# Patient Record
Sex: Female | Born: 1975
Health system: Southern US, Community
[De-identification: ages and names within clinical notes are randomized; demographics above are authoritative.]

## PROBLEM LIST (undated history)

## (undated) ENCOUNTER — Inpatient Hospital Stay (HOSPITAL_COMMUNITY): Payer: Self-pay

## (undated) DIAGNOSIS — I10 Essential (primary) hypertension: Secondary | ICD-10-CM

## (undated) DIAGNOSIS — Z9889 Other specified postprocedural states: Secondary | ICD-10-CM

## (undated) DIAGNOSIS — G43909 Migraine, unspecified, not intractable, without status migrainosus: Secondary | ICD-10-CM

## (undated) DIAGNOSIS — E119 Type 2 diabetes mellitus without complications: Secondary | ICD-10-CM

## (undated) DIAGNOSIS — A749 Chlamydial infection, unspecified: Secondary | ICD-10-CM

## (undated) DIAGNOSIS — A599 Trichomoniasis, unspecified: Secondary | ICD-10-CM

## (undated) DIAGNOSIS — G5601 Carpal tunnel syndrome, right upper limb: Secondary | ICD-10-CM

## (undated) DIAGNOSIS — O24419 Gestational diabetes mellitus in pregnancy, unspecified control: Secondary | ICD-10-CM

## (undated) DIAGNOSIS — M779 Enthesopathy, unspecified: Secondary | ICD-10-CM

## (undated) DIAGNOSIS — N83209 Unspecified ovarian cyst, unspecified side: Secondary | ICD-10-CM

## (undated) HISTORY — PX: BACK SURGERY: SHX140

## (undated) HISTORY — PX: WISDOM TOOTH EXTRACTION: SHX21

## (undated) HISTORY — PX: ABDOMINAL HYSTERECTOMY: SHX81

## (undated) HISTORY — DX: Other specified postprocedural states: Z98.890

---

## 1999-07-23 ENCOUNTER — Inpatient Hospital Stay (HOSPITAL_COMMUNITY): Admission: AD | Admit: 1999-07-23 | Discharge: 1999-07-23 | Payer: Self-pay | Admitting: Obstetrics

## 1999-07-24 ENCOUNTER — Encounter: Payer: Self-pay | Admitting: Obstetrics & Gynecology

## 1999-12-06 ENCOUNTER — Emergency Department (HOSPITAL_COMMUNITY): Admission: EM | Admit: 1999-12-06 | Discharge: 1999-12-06 | Payer: Self-pay | Admitting: Emergency Medicine

## 1999-12-27 ENCOUNTER — Emergency Department (HOSPITAL_COMMUNITY): Admission: EM | Admit: 1999-12-27 | Discharge: 1999-12-27 | Payer: Self-pay | Admitting: Unknown Physician Specialty

## 1999-12-27 ENCOUNTER — Encounter: Payer: Self-pay | Admitting: Emergency Medicine

## 2000-04-26 ENCOUNTER — Other Ambulatory Visit: Admission: RE | Admit: 2000-04-26 | Discharge: 2000-04-26 | Payer: Self-pay | Admitting: Obstetrics

## 2000-09-10 ENCOUNTER — Emergency Department (HOSPITAL_COMMUNITY): Admission: EM | Admit: 2000-09-10 | Discharge: 2000-09-10 | Payer: Self-pay | Admitting: Emergency Medicine

## 2001-01-23 ENCOUNTER — Emergency Department (HOSPITAL_COMMUNITY): Admission: EM | Admit: 2001-01-23 | Discharge: 2001-01-23 | Payer: Self-pay | Admitting: Internal Medicine

## 2001-03-29 ENCOUNTER — Other Ambulatory Visit: Admission: RE | Admit: 2001-03-29 | Discharge: 2001-03-29 | Payer: Self-pay | Admitting: Obstetrics

## 2001-12-12 ENCOUNTER — Emergency Department (HOSPITAL_COMMUNITY): Admission: EM | Admit: 2001-12-12 | Discharge: 2001-12-12 | Payer: Self-pay

## 2001-12-15 ENCOUNTER — Emergency Department (HOSPITAL_COMMUNITY): Admission: EM | Admit: 2001-12-15 | Discharge: 2001-12-15 | Payer: Self-pay | Admitting: Emergency Medicine

## 2001-12-16 ENCOUNTER — Emergency Department (HOSPITAL_COMMUNITY): Admission: EM | Admit: 2001-12-16 | Discharge: 2001-12-16 | Payer: Self-pay | Admitting: Emergency Medicine

## 2002-02-12 ENCOUNTER — Emergency Department (HOSPITAL_COMMUNITY): Admission: EM | Admit: 2002-02-12 | Discharge: 2002-02-12 | Payer: Self-pay | Admitting: Emergency Medicine

## 2002-05-07 ENCOUNTER — Emergency Department (HOSPITAL_COMMUNITY): Admission: EM | Admit: 2002-05-07 | Discharge: 2002-05-07 | Payer: Self-pay | Admitting: Emergency Medicine

## 2002-05-07 ENCOUNTER — Encounter: Payer: Self-pay | Admitting: Emergency Medicine

## 2002-08-07 ENCOUNTER — Emergency Department (HOSPITAL_COMMUNITY): Admission: EM | Admit: 2002-08-07 | Discharge: 2002-08-07 | Payer: Self-pay | Admitting: Emergency Medicine

## 2002-08-08 ENCOUNTER — Emergency Department (HOSPITAL_COMMUNITY): Admission: EM | Admit: 2002-08-08 | Discharge: 2002-08-08 | Payer: Self-pay | Admitting: Emergency Medicine

## 2002-12-26 ENCOUNTER — Emergency Department (HOSPITAL_COMMUNITY): Admission: EM | Admit: 2002-12-26 | Discharge: 2002-12-26 | Payer: Self-pay

## 2003-01-29 ENCOUNTER — Emergency Department (HOSPITAL_COMMUNITY): Admission: EM | Admit: 2003-01-29 | Discharge: 2003-01-29 | Payer: Self-pay | Admitting: Emergency Medicine

## 2003-10-04 ENCOUNTER — Emergency Department (HOSPITAL_COMMUNITY): Admission: EM | Admit: 2003-10-04 | Discharge: 2003-10-04 | Payer: Self-pay | Admitting: Internal Medicine

## 2003-10-04 ENCOUNTER — Encounter: Payer: Self-pay | Admitting: Emergency Medicine

## 2003-10-08 ENCOUNTER — Encounter: Payer: Self-pay | Admitting: Obstetrics and Gynecology

## 2003-10-08 ENCOUNTER — Inpatient Hospital Stay (HOSPITAL_COMMUNITY): Admission: AD | Admit: 2003-10-08 | Discharge: 2003-10-08 | Payer: Self-pay | Admitting: Obstetrics and Gynecology

## 2003-10-16 ENCOUNTER — Encounter: Admission: RE | Admit: 2003-10-16 | Discharge: 2003-10-16 | Payer: Self-pay | Admitting: Obstetrics and Gynecology

## 2003-10-25 ENCOUNTER — Ambulatory Visit (HOSPITAL_COMMUNITY): Admission: RE | Admit: 2003-10-25 | Discharge: 2003-10-25 | Payer: Self-pay | Admitting: Obstetrics and Gynecology

## 2004-07-16 ENCOUNTER — Inpatient Hospital Stay (HOSPITAL_COMMUNITY): Admission: AD | Admit: 2004-07-16 | Discharge: 2004-07-16 | Payer: Self-pay | Admitting: Obstetrics and Gynecology

## 2004-11-18 ENCOUNTER — Inpatient Hospital Stay (HOSPITAL_COMMUNITY): Admission: AD | Admit: 2004-11-18 | Discharge: 2004-11-18 | Payer: Self-pay | Admitting: *Deleted

## 2004-11-20 ENCOUNTER — Inpatient Hospital Stay (HOSPITAL_COMMUNITY): Admission: AD | Admit: 2004-11-20 | Discharge: 2004-11-20 | Payer: Self-pay | Admitting: Obstetrics & Gynecology

## 2004-12-09 ENCOUNTER — Ambulatory Visit: Payer: Self-pay | Admitting: Obstetrics & Gynecology

## 2005-01-22 ENCOUNTER — Emergency Department (HOSPITAL_COMMUNITY): Admission: EM | Admit: 2005-01-22 | Discharge: 2005-01-22 | Payer: Self-pay | Admitting: Emergency Medicine

## 2005-02-03 ENCOUNTER — Ambulatory Visit: Payer: Self-pay | Admitting: Obstetrics & Gynecology

## 2005-02-04 ENCOUNTER — Ambulatory Visit (HOSPITAL_COMMUNITY): Admission: RE | Admit: 2005-02-04 | Discharge: 2005-02-04 | Payer: Self-pay | Admitting: Obstetrics & Gynecology

## 2005-07-02 ENCOUNTER — Inpatient Hospital Stay (HOSPITAL_COMMUNITY): Admission: AD | Admit: 2005-07-02 | Discharge: 2005-07-02 | Payer: Self-pay | Admitting: Obstetrics & Gynecology

## 2005-07-20 ENCOUNTER — Inpatient Hospital Stay (HOSPITAL_COMMUNITY): Admission: AD | Admit: 2005-07-20 | Discharge: 2005-07-20 | Payer: Self-pay | Admitting: Obstetrics & Gynecology

## 2005-07-30 ENCOUNTER — Emergency Department (HOSPITAL_COMMUNITY): Admission: EM | Admit: 2005-07-30 | Discharge: 2005-07-30 | Payer: Self-pay | Admitting: Emergency Medicine

## 2005-11-08 ENCOUNTER — Inpatient Hospital Stay (HOSPITAL_COMMUNITY): Admission: AD | Admit: 2005-11-08 | Discharge: 2005-11-08 | Payer: Self-pay | Admitting: Obstetrics and Gynecology

## 2005-12-30 ENCOUNTER — Emergency Department (HOSPITAL_COMMUNITY): Admission: EM | Admit: 2005-12-30 | Discharge: 2005-12-30 | Payer: Self-pay | Admitting: Emergency Medicine

## 2006-04-06 ENCOUNTER — Inpatient Hospital Stay (HOSPITAL_COMMUNITY): Admission: AD | Admit: 2006-04-06 | Discharge: 2006-04-06 | Payer: Self-pay | Admitting: *Deleted

## 2006-08-17 ENCOUNTER — Inpatient Hospital Stay (HOSPITAL_COMMUNITY): Admission: AD | Admit: 2006-08-17 | Discharge: 2006-08-18 | Payer: Self-pay | Admitting: Obstetrics and Gynecology

## 2006-09-08 ENCOUNTER — Inpatient Hospital Stay (HOSPITAL_COMMUNITY): Admission: AD | Admit: 2006-09-08 | Discharge: 2006-09-08 | Payer: Self-pay | Admitting: Gynecology

## 2006-10-26 ENCOUNTER — Inpatient Hospital Stay (HOSPITAL_COMMUNITY): Admission: AD | Admit: 2006-10-26 | Discharge: 2006-10-26 | Payer: Self-pay | Admitting: Family Medicine

## 2006-12-01 ENCOUNTER — Ambulatory Visit: Payer: Self-pay | Admitting: *Deleted

## 2006-12-01 ENCOUNTER — Inpatient Hospital Stay (HOSPITAL_COMMUNITY): Admission: AD | Admit: 2006-12-01 | Discharge: 2006-12-01 | Payer: Self-pay | Admitting: Obstetrics & Gynecology

## 2007-01-18 ENCOUNTER — Inpatient Hospital Stay (HOSPITAL_COMMUNITY): Admission: AD | Admit: 2007-01-18 | Discharge: 2007-01-18 | Payer: Self-pay | Admitting: Obstetrics

## 2007-03-04 ENCOUNTER — Inpatient Hospital Stay (HOSPITAL_COMMUNITY): Admission: AD | Admit: 2007-03-04 | Discharge: 2007-03-04 | Payer: Self-pay | Admitting: Obstetrics

## 2007-04-03 ENCOUNTER — Inpatient Hospital Stay (HOSPITAL_COMMUNITY): Admission: AD | Admit: 2007-04-03 | Discharge: 2007-04-05 | Payer: Self-pay | Admitting: Obstetrics

## 2007-10-30 ENCOUNTER — Emergency Department (HOSPITAL_COMMUNITY): Admission: EM | Admit: 2007-10-30 | Discharge: 2007-10-30 | Payer: Self-pay | Admitting: Emergency Medicine

## 2008-04-03 ENCOUNTER — Emergency Department (HOSPITAL_COMMUNITY): Admission: EM | Admit: 2008-04-03 | Discharge: 2008-04-03 | Payer: Self-pay | Admitting: Emergency Medicine

## 2008-07-26 ENCOUNTER — Emergency Department (HOSPITAL_COMMUNITY): Admission: EM | Admit: 2008-07-26 | Discharge: 2008-07-26 | Payer: Self-pay | Admitting: Emergency Medicine

## 2009-02-18 HISTORY — PX: CHOLECYSTECTOMY: SHX55

## 2009-03-08 ENCOUNTER — Inpatient Hospital Stay (HOSPITAL_COMMUNITY): Admission: AD | Admit: 2009-03-08 | Discharge: 2009-03-08 | Payer: Self-pay | Admitting: Obstetrics & Gynecology

## 2009-03-31 ENCOUNTER — Emergency Department (HOSPITAL_COMMUNITY): Admission: EM | Admit: 2009-03-31 | Discharge: 2009-03-31 | Payer: Self-pay | Admitting: Emergency Medicine

## 2009-11-13 ENCOUNTER — Inpatient Hospital Stay (HOSPITAL_COMMUNITY): Admission: AD | Admit: 2009-11-13 | Discharge: 2009-11-13 | Payer: Self-pay | Admitting: Obstetrics and Gynecology

## 2009-11-21 ENCOUNTER — Emergency Department (HOSPITAL_COMMUNITY): Admission: EM | Admit: 2009-11-21 | Discharge: 2009-11-21 | Payer: Self-pay | Admitting: Emergency Medicine

## 2010-03-13 ENCOUNTER — Encounter (INDEPENDENT_AMBULATORY_CARE_PROVIDER_SITE_OTHER): Payer: Self-pay | Admitting: General Surgery

## 2010-03-13 ENCOUNTER — Ambulatory Visit (HOSPITAL_COMMUNITY): Admission: RE | Admit: 2010-03-13 | Discharge: 2010-03-14 | Payer: Self-pay | Admitting: General Surgery

## 2010-05-19 ENCOUNTER — Inpatient Hospital Stay (HOSPITAL_COMMUNITY): Admission: AD | Admit: 2010-05-19 | Discharge: 2010-05-19 | Payer: Self-pay | Admitting: Family Medicine

## 2010-06-19 ENCOUNTER — Emergency Department (HOSPITAL_COMMUNITY): Admission: EM | Admit: 2010-06-19 | Discharge: 2010-06-19 | Payer: Self-pay | Admitting: Emergency Medicine

## 2010-10-22 ENCOUNTER — Emergency Department (HOSPITAL_COMMUNITY)
Admission: EM | Admit: 2010-10-22 | Discharge: 2010-10-22 | Payer: Self-pay | Source: Home / Self Care | Admitting: Family Medicine

## 2010-12-15 ENCOUNTER — Inpatient Hospital Stay (HOSPITAL_COMMUNITY)
Admission: AD | Admit: 2010-12-15 | Discharge: 2010-12-15 | Payer: Self-pay | Source: Home / Self Care | Attending: Obstetrics & Gynecology | Admitting: Obstetrics & Gynecology

## 2011-01-11 ENCOUNTER — Encounter: Payer: Self-pay | Admitting: *Deleted

## 2011-03-02 LAB — WET PREP, GENITAL
Clue Cells Wet Prep HPF POC: NONE SEEN
Trich, Wet Prep: NONE SEEN
Yeast Wet Prep HPF POC: NONE SEEN

## 2011-03-02 LAB — GC/CHLAMYDIA PROBE AMP, GENITAL
Chlamydia, DNA Probe: NEGATIVE
GC Probe Amp, Genital: NEGATIVE

## 2011-03-09 LAB — URINALYSIS, ROUTINE W REFLEX MICROSCOPIC
Bilirubin Urine: NEGATIVE
Glucose, UA: NEGATIVE mg/dL
Ketones, ur: NEGATIVE mg/dL
Leukocytes, UA: NEGATIVE
Nitrite: NEGATIVE
Protein, ur: NEGATIVE mg/dL
Specific Gravity, Urine: >1.03 — ABNORMAL HIGH (ref 1.005–1.030)
Urobilinogen, UA: 0.2 mg/dL (ref 0.0–1.0)
pH: 6 (ref 5.0–8.0)

## 2011-03-09 LAB — CBC
HCT: 38.5 % (ref 36.0–46.0)
Hemoglobin: 13.1 g/dL (ref 12.0–15.0)
MCHC: 33.9 g/dL (ref 30.0–36.0)
MCV: 90.3 fL (ref 78.0–100.0)
Platelets: 290 10*3/uL (ref 150–400)
RBC: 4.27 MIL/uL (ref 3.87–5.11)
RDW: 14.7 % (ref 11.5–15.5)
WBC: 6.1 10*3/uL (ref 4.0–10.5)

## 2011-03-09 LAB — GC/CHLAMYDIA PROBE AMP, GENITAL
Chlamydia, DNA Probe: NEGATIVE
GC Probe Amp, Genital: NEGATIVE

## 2011-03-09 LAB — WET PREP, GENITAL
Trich, Wet Prep: NONE SEEN
Yeast Wet Prep HPF POC: NONE SEEN

## 2011-03-09 LAB — URINE MICROSCOPIC-ADD ON

## 2011-03-09 LAB — POCT PREGNANCY, URINE: Preg Test, Ur: NEGATIVE

## 2011-03-09 LAB — HCG, QUANTITATIVE, PREGNANCY: hCG, Beta Chain, Quant, S: <2 m[IU]/mL (ref <5)

## 2011-03-16 LAB — CBC
HCT: 40.4 % (ref 36.0–46.0)
Hemoglobin: 13.4 g/dL (ref 12.0–15.0)
MCHC: 33.2 g/dL (ref 30.0–36.0)
MCV: 90.3 fL (ref 78.0–100.0)
Platelets: 278 10*3/uL (ref 150–400)
RBC: 4.48 MIL/uL (ref 3.87–5.11)
RDW: 14.3 % (ref 11.5–15.5)
WBC: 6.1 10*3/uL (ref 4.0–10.5)

## 2011-03-16 LAB — DIFFERENTIAL
Basophils Absolute: 0 10*3/uL (ref 0.0–0.1)
Lymphocytes Relative: 40 % (ref 12–46)
Neutro Abs: 2.9 10*3/uL (ref 1.7–7.7)

## 2011-03-24 LAB — COMPREHENSIVE METABOLIC PANEL
Albumin: 3.4 g/dL — ABNORMAL LOW (ref 3.5–5.2)
Alkaline Phosphatase: 79 U/L (ref 39–117)
BUN: 8 mg/dL (ref 6–23)
CO2: 25 mEq/L (ref 19–32)
Chloride: 100 mEq/L (ref 96–112)
Glucose, Bld: 153 mg/dL — ABNORMAL HIGH (ref 70–99)
Potassium: 5.6 mEq/L — ABNORMAL HIGH (ref 3.5–5.1)
Total Bilirubin: 2 mg/dL — ABNORMAL HIGH (ref 0.3–1.2)

## 2011-03-24 LAB — URINE MICROSCOPIC-ADD ON

## 2011-03-24 LAB — URINALYSIS, ROUTINE W REFLEX MICROSCOPIC
Hgb urine dipstick: NEGATIVE
Protein, ur: NEGATIVE mg/dL
Urobilinogen, UA: 1 mg/dL (ref 0.0–1.0)

## 2011-03-24 LAB — DIFFERENTIAL
Basophils Absolute: 0.1 10*3/uL (ref 0.0–0.1)
Basophils Relative: 1 % (ref 0–1)
Monocytes Absolute: 0.6 10*3/uL (ref 0.1–1.0)
Neutro Abs: 7.8 10*3/uL — ABNORMAL HIGH (ref 1.7–7.7)
Neutrophils Relative %: 70 % (ref 43–77)

## 2011-03-24 LAB — CBC
HCT: 37.1 % (ref 36.0–46.0)
Hemoglobin: 12.6 g/dL (ref 12.0–15.0)
RBC: 4.07 MIL/uL (ref 3.87–5.11)
WBC: 11.1 10*3/uL — ABNORMAL HIGH (ref 4.0–10.5)

## 2011-03-24 LAB — POCT PREGNANCY, URINE: Preg Test, Ur: NEGATIVE

## 2011-03-24 LAB — POTASSIUM: Potassium: 4 mEq/L (ref 3.5–5.1)

## 2011-03-25 LAB — COMPREHENSIVE METABOLIC PANEL
ALT: 22 U/L (ref 0–35)
AST: 23 U/L (ref 0–37)
CO2: 28 mEq/L (ref 19–32)
Chloride: 100 mEq/L (ref 96–112)
GFR calc Af Amer: >60 mL/min (ref >60)
GFR calc non Af Amer: >60 mL/min (ref >60)
Potassium: 3.9 mEq/L (ref 3.5–5.1)
Sodium: 136 mEq/L (ref 135–145)
Total Bilirubin: 0.5 mg/dL (ref 0.3–1.2)

## 2011-03-25 LAB — URINE MICROSCOPIC-ADD ON

## 2011-03-25 LAB — POCT PREGNANCY, URINE: Preg Test, Ur: NEGATIVE

## 2011-03-25 LAB — URINALYSIS, ROUTINE W REFLEX MICROSCOPIC
Bilirubin Urine: NEGATIVE
Glucose, UA: NEGATIVE mg/dL
Protein, ur: 30 mg/dL — AB
Urobilinogen, UA: 0.2 mg/dL (ref 0.0–1.0)

## 2011-03-25 LAB — WET PREP, GENITAL
Trich, Wet Prep: NONE SEEN
Yeast Wet Prep HPF POC: NONE SEEN

## 2011-03-25 LAB — CBC
RBC: 4.62 MIL/uL (ref 3.87–5.11)
WBC: 10.6 10*3/uL — ABNORMAL HIGH (ref 4.0–10.5)

## 2011-03-25 LAB — GC/CHLAMYDIA PROBE AMP, GENITAL: GC Probe Amp, Genital: NEGATIVE

## 2011-03-25 LAB — AMYLASE: Amylase: 84 U/L (ref 27–131)

## 2011-04-02 LAB — URINALYSIS, ROUTINE W REFLEX MICROSCOPIC
Glucose, UA: NEGATIVE mg/dL
Ketones, ur: NEGATIVE mg/dL
Leukocytes, UA: NEGATIVE
Nitrite: POSITIVE — AB
Specific Gravity, Urine: 1.015 (ref 1.005–1.030)
pH: 7 (ref 5.0–8.0)

## 2011-04-02 LAB — DIFFERENTIAL
Basophils Absolute: 0 10*3/uL (ref 0.0–0.1)
Eosinophils Relative: 2 % (ref 0–5)
Lymphocytes Relative: 32 % (ref 12–46)
Lymphs Abs: 3 10*3/uL (ref 0.7–4.0)
Neutro Abs: 5.6 10*3/uL (ref 1.7–7.7)
Neutrophils Relative %: 60 % (ref 43–77)

## 2011-04-02 LAB — CBC
HCT: 39.3 % (ref 36.0–46.0)
Platelets: 297 10*3/uL (ref 150–400)
RDW: 14.7 % (ref 11.5–15.5)
WBC: 9.4 10*3/uL (ref 4.0–10.5)

## 2011-04-02 LAB — WET PREP, GENITAL: Clue Cells Wet Prep HPF POC: NONE SEEN

## 2011-04-02 LAB — URINE CULTURE: Colony Count: >=100000

## 2011-04-02 LAB — URINE MICROSCOPIC-ADD ON

## 2011-05-05 ENCOUNTER — Inpatient Hospital Stay (HOSPITAL_COMMUNITY)
Admission: AD | Admit: 2011-05-05 | Discharge: 2011-05-05 | Disposition: A | Discharge status: Home / Self Care | Payer: Medicaid Other | Source: Ambulatory Visit | Attending: Obstetrics & Gynecology | Admitting: Obstetrics & Gynecology

## 2011-05-05 DIAGNOSIS — O210 Mild hyperemesis gravidarum: Secondary | ICD-10-CM | POA: Insufficient documentation

## 2011-05-05 LAB — URINALYSIS, ROUTINE W REFLEX MICROSCOPIC
Bilirubin Urine: NEGATIVE
Glucose, UA: NEGATIVE mg/dL
Ketones, ur: NEGATIVE mg/dL
Protein, ur: NEGATIVE mg/dL
pH: 6 (ref 5.0–8.0)

## 2011-05-05 LAB — CBC
HCT: 41.7 % (ref 36.0–46.0)
MCV: 91 fL (ref 78.0–100.0)
RBC: 4.58 MIL/uL (ref 3.87–5.11)
RDW: 13.6 % (ref 11.5–15.5)
WBC: 8.5 10*3/uL (ref 4.0–10.5)

## 2011-05-05 LAB — URINE MICROSCOPIC-ADD ON

## 2011-05-08 NOTE — Group Therapy Note (Signed)
[name], Meredith York                         ACCOUNT NO.:  [id number]   MEDICAL RECORD NO.:  [id number]                   PATIENT TYPE:  OUT   LOCATION:  [facility]                           FACILITY:  [facility]   PHYSICIAN:  Elsie Lincoln, MD                   DATE OF BIRTH:  25-Apr-1976   DATE OF SERVICE:                                    CLINIC NOTE   This is a 35 year old G2, para 1-0-1-1 with LMP October 12, 2003 and  currently spotting.  The patient presents for followup of MAU visit two  weeks ago for right lower quadrant pain and presumed ruptured ovarian cyst.  The patient is also diagnosed with Trichomonas.  The patient was treated  with Flagyl and also with Rocephin and Zithromax for presumed cervicitis.  Since discharge MAU the patient has been asymptomatic.  There has been right  lower quadrant pain; however, the patient is spotting today which is  abnormal for her.  Ever since the patient stopped Depo three years ago, the  patient has had abnormal periods.  Sometimes she will go six months without  a period, and other times she will bleed heavy, and other times she will  spot.  The patient is also interested in contraception options, although not  sexually active currently.   PAST MEDICAL HISTORY:  Denies, however, had borderline blood pressure  130s/80s in MAU two weeks ago and 142/92 today.   SURGERIES:  Denies.   GYNECOLOGIC HISTORY:  1. No abnormal Pap smears.  2. History of Chlamydia several years ago and Trichomonas.  3. Right ovarian cyst as above.  No fibroids or breast masses.   ALLERGIES:  None.   MEDICATIONS:  None.   REVIEW OF SYMPTOMS:  No chest pain, shortness of breath, nausea, vomiting,  diarrhea or change in bladder habits.   PHYSICAL EXAMINATION:  VITAL SIGNS:  Temperature 98.8, pulse 74, blood  pressure 142/92, weight 193, height 5 foot 2.  BREASTS:  Soft, no masses, nontender.  ABDOMEN:  Slightly obese, nontender, nondistended.  EXTERNAL  GENITALIA:  Tanner V.  Vagina:  Pink, healthy rugae.  No abnormal  discharge.  Small amount of blood.  Cervix:  Closed, nontender, no lesions.  Uterus:  Small, mobile, anteverted, nontender.  Adnexa:  Right slightly  enlarged, nontender.  Left no masses, nontender.   ASSESSMENT/PLAN:  A 35 year old female:  1. Right ovarian cyst resolving clinically.  We will repeat transvaginal     ultrasound in 6-8 weeks to see if it is resolved.  2. Health maintenance Pap smear done.  Repeat a wet prep to make sue     Trichomonas has resolved.  3. Abnormal uterine bleeding.  Patient questionably  polycystic ovary.     Given the patient has no insurance at this time but is expected to have     insurance benefits in the next month or two, she wants to hold  off on     doing any laboratory testing to help diagnose polycystic ovarian     syndrome.  Will check beta human chorionic gonadotropin  and thyroid-     stimulating hormone today.  4. Return to clinic in eight weeks.  Will proceed with testing for     polycystic ovarian syndrome including dehydroepiandrosterone sulfate,     __________, 17-hydroxyprogesterone and a 75 gram glucose tolerance test     with fasting insulin and glucose levels.  5. Will re-evaluate hypertension.  If still hypertensive, we will refer to     family practice or other internist for management.  If not hypertensive,     we will likely put on a combination of __________.  6. The patient refuses Depo-Provera, intrauterine device or progesterone     only pill at this time.                                               Elsie Lincoln, MD    KL/MEDQ  D:  10/16/2003  T:  10/16/2003  Job:  [id number]

## 2011-05-08 NOTE — Group Therapy Note (Signed)
Meredith York, Meredith York NO.:  [id number]   MEDICAL RECORD NO.:  [id number]          PATIENT TYPE:  WOC   LOCATION:  [facility]                   FACILITY:  [facility]   PHYSICIAN:  Elsie Lincoln, MD      DATE OF BIRTH:  Oct 20, 1976   DATE OF SERVICE:  12/09/2004                                    CLINIC NOTE   The patient is a 35 year old para 1-0-1-1 female who was last seen in our  clinic October 13, 2004, for an ovarian cyst.  This resolved spontaneously  on its own.  She did have a tap from that day that was normal.  She did  complain of irregular periods and was going to have a workup for PCO.  She  had an appointment scheduled December 11, 2003, but she missed this.  She  wanted to wait until she had insurance to do an extensive workup.  She is  still without insurance, however, she states that she does have more regular  periods.  Her LMP currently is October 26, 2004.  She has not had her period  in December yet, however, she was placed on Yasmin two weeks ago so she  would not be expected to have a period until at least another week to 1 1/2  weeks.  She presented to the MAU November 18, 2004, with pelvic pain.  She  was found to have a right hemorrhagic cyst.  She represented on December 1  and had a stable cyst.  She was placed on Yasmin as stated above and also  was given a prescription for Darvocet and Motrin.  This controlled her pain.  She is pain free currently.  It is time for her Pap smear and as the patient  is here today and undressed, we will proceed with this.  The patient will  come back in two months for a follow up clinical exam and ultrasound if  needed.   Temperature 98.8, pulse 95, blood pressure 130/74, weight 194.9, height 5  feet 2 1/2 inches.  General:  Well developed, well nourished in no apparent  distress.  Abdomen soft, nontender, nondistended.  Genitalia Tanner 5,  vagina pink, no discharge.  Cervix small Nabothian cyst on the anterior  lip,  otherwise, closed, nontender.  Uterus mobile, nontender.  Adnexa slight  fullness in the right adnexa, however, difficult to palpate secondary to  body habitus.  Left adnexa with no masses, nontender.   ASSESSMENT/PLAN:  35 year old female followed with right ovarian cyst.   1.  Pap smear today.  2.  Continue Yasmin.  The patient was given two samples from our clinic.  3.  Return to clinic in eight weeks to evaluate how she is doing on the      Yasmin and pelvic pain.      KL/MEDQ  D:  12/09/2004  T:  12/10/2004  Job:  [id number]

## 2011-05-08 NOTE — Group Therapy Note (Signed)
Meredith York, PERHAM NO.:  [id number]   MEDICAL RECORD NO.:  [id number]          PATIENT TYPE:  WOC   LOCATION:  [facility]                   [facility]ACILITY:  [facility]   PHYSICIAN:  Elsie Lincoln, MD      DATE OF BIRTH:  July 06, 1976   DATE OF SERVICE:  02/03/2005                                    CLINIC NOTE   REASON FOR VISIT:  The patient is a 35 year old para 1-0-0-1 female who was  seen in our clinic for follow-up MAU visit for pelvic pain.  The patient was  noted to have a hemorrhagic cyst that was stable as of November 20, 2004.  The patient also had some menstrual irregularities for which she was placed  on Yasmin.  She has been pain free since December and has enjoyed being on  the Yasmin.  She is not sexually active at this time but she wants to  continue as she plans to be sexually active with her next partner.  Her Pap  smear was normal in December 2000 and she has no complaints today.   VITAL SIGNS:  Temperature 98.0, pulse 99, blood pressure 110/74, weight  199.3, height 5 feet 2.5 inches.   ASSESSMENT AND PLAN:  A 35 year old female with hemorrhagic cyst.   1.  Follow-up transvaginal ultrasound.  2.  Continue Yasmin.  3.  Return to clinic December 2006 for Pap smear.  4.  If ovarian cyst is still present we will contact the patient to come in      sooner.      KL/MEDQ  D:  02/03/2005  T:  02/03/2005  Job:  [id number]

## 2011-05-10 ENCOUNTER — Inpatient Hospital Stay (HOSPITAL_COMMUNITY)
Admission: AD | Admit: 2011-05-10 | Discharge: 2011-05-10 | Disposition: A | Discharge status: Home / Self Care | Payer: Medicaid Other | Source: Ambulatory Visit | Attending: Obstetrics | Admitting: Obstetrics

## 2011-05-10 ENCOUNTER — Inpatient Hospital Stay (HOSPITAL_COMMUNITY): Payer: Medicaid Other

## 2011-05-10 DIAGNOSIS — A499 Bacterial infection, unspecified: Secondary | ICD-10-CM | POA: Insufficient documentation

## 2011-05-10 DIAGNOSIS — R109 Unspecified abdominal pain: Secondary | ICD-10-CM | POA: Insufficient documentation

## 2011-05-10 DIAGNOSIS — N760 Acute vaginitis: Secondary | ICD-10-CM

## 2011-05-10 DIAGNOSIS — O2390 Unspecified genitourinary tract infection in pregnancy, unspecified trimester: Secondary | ICD-10-CM | POA: Insufficient documentation

## 2011-05-10 DIAGNOSIS — B9689 Other specified bacterial agents as the cause of diseases classified elsewhere: Secondary | ICD-10-CM | POA: Insufficient documentation

## 2011-05-10 LAB — CBC
MCH: 29.2 pg (ref 26.0–34.0)
MCV: 91 fL (ref 78.0–100.0)
Platelets: 309 10*3/uL (ref 150–400)
RDW: 13.7 % (ref 11.5–15.5)
WBC: 6.9 10*3/uL (ref 4.0–10.5)

## 2011-05-10 LAB — URINE MICROSCOPIC-ADD ON

## 2011-05-10 LAB — URINALYSIS, ROUTINE W REFLEX MICROSCOPIC
Ketones, ur: NEGATIVE mg/dL
Nitrite: NEGATIVE
Protein, ur: NEGATIVE mg/dL
pH: 6 (ref 5.0–8.0)

## 2011-05-10 LAB — WET PREP, GENITAL
Trich, Wet Prep: NONE SEEN
Yeast Wet Prep HPF POC: NONE SEEN

## 2011-05-10 LAB — RUBELLA ANTIBODY, IGM: Rubella: IMMUNE

## 2011-05-10 LAB — RPR: RPR: NONREACTIVE

## 2011-05-10 LAB — HEPATITIS B SURFACE ANTIGEN: Hepatitis B Surface Ag: NEGATIVE

## 2011-05-10 LAB — HIV ANTIBODY (ROUTINE TESTING W REFLEX): HIV: NONREACTIVE

## 2011-07-04 ENCOUNTER — Inpatient Hospital Stay (HOSPITAL_COMMUNITY)
Admission: AD | Admit: 2011-07-04 | Discharge: 2011-07-05 | Disposition: A | Discharge status: Home / Self Care | Payer: Medicaid Other | Source: Ambulatory Visit | Attending: Obstetrics | Admitting: Obstetrics

## 2011-07-04 DIAGNOSIS — R519 Headache, unspecified: Secondary | ICD-10-CM | POA: Diagnosis present

## 2011-07-04 DIAGNOSIS — O99891 Other specified diseases and conditions complicating pregnancy: Secondary | ICD-10-CM | POA: Insufficient documentation

## 2011-07-04 DIAGNOSIS — R51 Headache: Secondary | ICD-10-CM

## 2011-07-04 DIAGNOSIS — O0990 Supervision of high risk pregnancy, unspecified, unspecified trimester: Secondary | ICD-10-CM

## 2011-07-04 DIAGNOSIS — O9989 Other specified diseases and conditions complicating pregnancy, childbirth and the puerperium: Secondary | ICD-10-CM

## 2011-07-04 DIAGNOSIS — I10 Essential (primary) hypertension: Secondary | ICD-10-CM | POA: Diagnosis present

## 2011-07-04 HISTORY — DX: Essential (primary) hypertension: I10

## 2011-07-04 NOTE — Progress Notes (Signed)
Pt to [facility] to collect CCUA 

## 2011-07-05 ENCOUNTER — Encounter (HOSPITAL_COMMUNITY): Payer: Self-pay | Admitting: Obstetrics and Gynecology

## 2011-07-05 DIAGNOSIS — R51 Headache: Secondary | ICD-10-CM | POA: Diagnosis present

## 2011-07-05 DIAGNOSIS — O0990 Supervision of high risk pregnancy, unspecified, unspecified trimester: Secondary | ICD-10-CM

## 2011-07-05 DIAGNOSIS — R519 Headache, unspecified: Secondary | ICD-10-CM | POA: Diagnosis present

## 2011-07-05 DIAGNOSIS — I10 Essential (primary) hypertension: Secondary | ICD-10-CM | POA: Diagnosis present

## 2011-07-05 LAB — URINALYSIS, ROUTINE W REFLEX MICROSCOPIC
Bilirubin Urine: NEGATIVE
Hgb urine dipstick: NEGATIVE
Specific Gravity, Urine: 1.025 (ref 1.005–1.030)
pH: 6 (ref 5.0–8.0)

## 2011-07-05 MED ORDER — LACTATED RINGERS IV SOLN
INTRAVENOUS | Status: DC
  Administered 2011-07-05: 02:00:00 via INTRAVENOUS

## 2011-07-05 MED ORDER — METOCLOPRAMIDE HCL 5 MG/ML IJ SOLN
10.0000 mg | Freq: Once | INTRAMUSCULAR | Status: AC
  Administered 2011-07-05: 10 mg via INTRAVENOUS
  Filled 2011-07-05: qty 2

## 2011-07-05 MED ORDER — DEXAMETHASONE SODIUM PHOSPHATE 10 MG/ML IJ SOLN
10.0000 mg | Freq: Once | INTRAMUSCULAR | Status: AC
  Administered 2011-07-05: 10 mg via INTRAVENOUS
  Filled 2011-07-05: qty 1

## 2011-07-05 MED ORDER — DIPHENHYDRAMINE HCL 50 MG/ML IJ SOLN
25.0000 mg | Freq: Once | INTRAMUSCULAR | Status: AC
  Administered 2011-07-05: 25 mg via INTRAVENOUS
  Filled 2011-07-05: qty 1

## 2011-07-05 NOTE — Progress Notes (Signed)
"  I have had a H/A for the past 3 days.  It got worse today.  I have also been very weak and fatigued to the point where it is hard for me to get out of bed.  I have to make myself get up."

## 2011-07-05 NOTE — ED Provider Notes (Signed)
History     Chief Complaint  Patient presents with  . Fatigue  . Headache   HPI G4P2 @ [redacted] wks EGA with h/a x 3 days, entire head hurts, hasn't tried any medicine, + blurred vision "every now and again", + fatigue and weakness, nausea improved recently, no light/sound sensitivity, h/o migraines "years ago". No vaginal bleeding or abdominal pain. Chronic HTN, on Aldomet. Prenatal care with Dr. Gaynell Face.  OB History    Grav Para Term Preterm Abortions TAB SAB Ect Mult Living   4 2 2  1 1    2       Past Medical History  Diagnosis Date  . Hypertension     Past Surgical History  Procedure Date  . Cholecystectomy 02/2009    Family History  Problem Relation Age of Onset  . Hypertension Maternal Aunt   . Hypertension Paternal Aunt     History  Substance Use Topics  . Smoking status: Former Smoker -- 0.2 packs/day for 16 years    Types: Cigarettes    Quit date: 05/12/2011  . Smokeless tobacco: Not on file  . Alcohol Use: 1.2 oz/week    1 Glasses of wine, 1 Shots of liquor per week     Occasional, Quit with (+) UPT    Allergies:  Allergies  Allergen Reactions  . Amoxicillin Itching    welps    Prescriptions prior to admission  Medication Sig Dispense Refill  . methyldopa (ALDOMET) 500 MG tablet Take 500 mg by mouth 3 (three) times daily.        . ondansetron (ZOFRAN-ODT) 4 MG disintegrating tablet Take 4 mg by mouth every 4 (four) hours as needed. nausea       . prenatal vitamin w/FE, FA (PRENATAL 1 + 1) 27-1 MG TABS Take 1 tablet by mouth daily.        . promethazine (PHENERGAN) 25 MG suppository Place 25 mg rectally every 6 (six) hours as needed. Nausea/vomiting         Review of Systems  Constitutional: Positive for malaise/fatigue. Negative for fever and chills.  HENT: Negative for ear pain and tinnitus.   Eyes: Positive for blurred vision. Negative for photophobia.  Respiratory: Negative for cough.   Cardiovascular: Negative for chest pain.  Gastrointestinal:  Positive for nausea. Negative for vomiting.  Genitourinary: Negative.   Neurological: Positive for headaches. Negative for dizziness, sensory change, speech change and focal weakness.  Psychiatric/Behavioral: Negative.    Physical Exam   Blood pressure 138/81, pulse 110, temperature 97.9 F (36.6 C), temperature source Oral, resp. rate 22, SpO2 99.00%.  Physical Exam  Vitals reviewed. Constitutional: She is oriented to person, place, and time. She appears well-developed and well-nourished.  HENT:  Head: Normocephalic.  Cardiovascular: Normal rate.   Respiratory: Effort normal.  Musculoskeletal: Normal range of motion.  Neurological: She is alert and oriented to person, place, and time. No cranial nerve deficit.  Skin: Skin is warm and dry.    MAU Course  Procedures Headache resolved with IV hydration, Benadryl, Decadron and Reglan IV.    MDM A/P: 35 yo G4P2 @ 13.[redacted] wks EGA Headache, resolved F/U as scheduled or sooner if symptoms return/worsen

## 2011-09-15 LAB — CBC
HCT: 41.7
Hemoglobin: 13.7
MCHC: 32.9
RDW: 14.9

## 2011-09-15 LAB — BASIC METABOLIC PANEL
CO2: 24
GFR calc non Af Amer: >60
Glucose, Bld: 112 — ABNORMAL HIGH
Potassium: 3.7
Sodium: 137

## 2011-09-15 LAB — DIFFERENTIAL
Basophils Absolute: 0
Eosinophils Relative: 0
Lymphocytes Relative: 15
Monocytes Absolute: 0.9

## 2011-09-18 LAB — RAPID STREP SCREEN (MED CTR MEBANE ONLY): Streptococcus, Group A Screen (Direct): NEGATIVE

## 2011-10-01 ENCOUNTER — Encounter (HOSPITAL_COMMUNITY): Payer: Self-pay | Admitting: *Deleted

## 2011-10-01 ENCOUNTER — Inpatient Hospital Stay (HOSPITAL_COMMUNITY)
Admission: AD | Admit: 2011-10-01 | Discharge: 2011-10-01 | Disposition: A | Discharge status: Home / Self Care | Payer: Medicaid Other | Source: Ambulatory Visit | Attending: Obstetrics | Admitting: Obstetrics

## 2011-10-01 DIAGNOSIS — N949 Unspecified condition associated with female genital organs and menstrual cycle: Secondary | ICD-10-CM

## 2011-10-01 DIAGNOSIS — R109 Unspecified abdominal pain: Secondary | ICD-10-CM | POA: Insufficient documentation

## 2011-10-01 DIAGNOSIS — O99891 Other specified diseases and conditions complicating pregnancy: Secondary | ICD-10-CM | POA: Insufficient documentation

## 2011-10-01 HISTORY — DX: Trichomoniasis, unspecified: A59.9

## 2011-10-01 HISTORY — DX: Migraine, unspecified, not intractable, without status migrainosus: G43.909

## 2011-10-01 HISTORY — DX: Unspecified ovarian cyst, unspecified side: N83.209

## 2011-10-01 HISTORY — DX: Chlamydial infection, unspecified: A74.9

## 2011-10-01 LAB — URINALYSIS, ROUTINE W REFLEX MICROSCOPIC
Bilirubin Urine: NEGATIVE
Glucose, UA: NEGATIVE mg/dL
Hgb urine dipstick: NEGATIVE
Specific Gravity, Urine: >1.03 — ABNORMAL HIGH (ref 1.005–1.030)
Urobilinogen, UA: 1 mg/dL (ref 0.0–1.0)

## 2011-10-01 NOTE — Progress Notes (Signed)
Pt states she started having sharp abdominal pain that radiates into back off and on. Worse with movement. No bleeding or leaking. Reports good fetal movement.

## 2011-10-01 NOTE — ED Provider Notes (Signed)
History     Chief Complaint  Patient presents with  . Abdominal Pain   HPI Presents with 2 day history of crampy pain and intermittent sharp lower side pains.   Denies leaking or bleeding. Reports fetal movement as painful. Talked to Dr Gaynell Face yesterday and he told her this was all normal. No history of PTL or PTD.   Past Medical History  Diagnosis Date  . Hypertension   . Diabetes in pregnancy   . Migraines   . Ovarian cyst   . Chlamydia   . Trichomonas     Past Surgical History  Procedure Date  . Cholecystectomy 02/2009    Family History  Problem Relation Age of Onset  . Hypertension Maternal Aunt   . Hypertension Paternal Aunt     History  Substance Use Topics  . Smoking status: Former Smoker -- 0.2 packs/day for 16 years    Types: Cigarettes    Quit date: 05/12/2011  . Smokeless tobacco: Not on file  . Alcohol Use: 1.2 oz/week    1 Glasses of wine, 1 Shots of liquor per week     Occasional, Quit with (+) UPT    Allergies:  Allergies  Allergen Reactions  . Amoxicillin Itching    welps    Prescriptions prior to admission  Medication Sig Dispense Refill  . amLODipine (NORVASC) 5 MG tablet Take 5 mg by mouth daily.        . prenatal vitamin w/FE, FA (PRENATAL 1 + 1) 27-1 MG TABS Take 1 tablet by mouth daily.        . methyldopa (ALDOMET) 500 MG tablet Take 500 mg by mouth 3 (three) times daily.        . ondansetron (ZOFRAN-ODT) 4 MG disintegrating tablet Take 4 mg by mouth every 4 (four) hours as needed. nausea       . promethazine (PHENERGAN) 25 MG suppository Place 25 mg rectally every 6 (six) hours as needed. Nausea/vomiting         Review of Systems  Constitutional: Negative for fever.  Gastrointestinal: Positive for abdominal pain (intermittent crampy lower abdominal ). Negative for nausea and vomiting.   Physical Exam   Blood pressure 140/73, pulse 104, temperature 98.7 F (37.1 C), temperature source Oral, resp. rate 20, height 5' 1.5" (1.562  m), weight 231 lb 12.8 oz (105.144 kg), SpO2 97.00%.  Physical Exam  Constitutional: She is oriented to person, place, and time. She appears well-developed and well-nourished.  Respiratory: Effort normal.  GI: Soft. She exhibits no distension and no mass. There is no tenderness. There is no rebound and no guarding.  Genitourinary: Vagina normal and uterus normal. No vaginal discharge found.       Fundal Height c/w dates. Abdomen nontender. EFM reassuring with occasional cramps less than 10 seconds. No contractions. Cx long and closed.   Musculoskeletal: Normal range of motion.  Neurological: She is alert and oriented to person, place, and time.  Skin: Skin is warm and dry.  Psychiatric: She has a normal mood and affect.    MAU Course  Procedures  MDM   Assessment and Plan  A:  Uterine cramping at 25 weeks Round ligament pain  P:  Notified Dr Gaynell Face Discussed comfort measures and reassured this does not represent PTL Advised increased water intake and eat about every 3-4 hours. Rest as needed. Reassured Fetal movement is good and will probably change in character over time. D/C home  [facility] 10/01/2011, 1:34 PM

## 2011-10-08 ENCOUNTER — Ambulatory Visit (HOSPITAL_COMMUNITY)
Admission: RE | Admit: 2011-10-08 | Discharge: 2011-10-08 | Disposition: A | Discharge status: Home / Self Care | Payer: Medicaid Other | Source: Ambulatory Visit | Attending: Obstetrics | Admitting: Obstetrics

## 2011-10-08 ENCOUNTER — Encounter: Payer: Medicaid Other | Attending: Obstetrics | Admitting: Dietician

## 2011-10-08 DIAGNOSIS — O0990 Supervision of high risk pregnancy, unspecified, unspecified trimester: Secondary | ICD-10-CM

## 2011-10-08 DIAGNOSIS — I10 Essential (primary) hypertension: Secondary | ICD-10-CM

## 2011-10-08 NOTE — ED Notes (Signed)
10/08/2011 Gestational Diabetes Education. Ht: 62.5 in  Wt: 235.04lb  EDC: 01/10/2012  Weeks Pregnant: 26  G4 P2.  History GDM with her second last pregnancy 4 years ago.  She is taking prenatal vitamins.  She is walking about 20 minutes per day in small increments.  She is allergic to amoxicillin.  She consumes no alcohol or smokes tobacco products.  She currently has a problem with hypertension and is on Norvasc for the BP.  Her 1 hr GTT was 235 mg.  We reviewed the physiology of diabetes and pregnancy, the dietary recommendations, the menu suggestions for carb counts, and self-care following delivery of the baby.  I provided her with monitoring instructions and a Accu-Chek Nano SmartView Monitoring kit.  On return demonstration for monitoring, her glucose level was 102 approximately 1 hr following a package of peanut butter crackers.  Handouts provided included:  Nutrition Diabetes and Pregnancy, Carbohydrate Counting Guide.  She has my card and is to follow-up with any questions or need for assistance.  Maggie Dagny Fiorentino, RN, RD, CDE

## 2011-10-08 NOTE — Progress Notes (Signed)
MFM Consultation Note  [name] is a 35 year old G3P2 AA female at 26+ weeks who presents for consultation regarding her gestational diabetes and chronic hypertension. Her gestational DM was recently diagnosed when her one hour glucola returned with a glucose of 235. She is not aware that she had DM prior to pregnancy; however, she had gestational DM with her last pregnancy. The chronic hypertension has never been formally diagnosed but she stated her BP would be elevated at ER visits. She was started on Aldomet at the beginning of the pregnancy and then switched to Norvasc due to side effects. Her BP is currently controlled.   Her OB history consists of two deliveries: 1) 1998; full term vaginal delivery; no prenatal complications; daughter born with bilateral club feet, Hirschsprung's disease and preaxial polydactyly; she is currently healthy 2) 2008; full term vaginal delivery; gestational DM controlled with diet; female weighed 8+15 with transient Erb's palsy   We had a discussion regarding DM and HTN in pregnancy. If her glucoses are not controlled, her fetus is a risk for fetal demise, macrosomia, birth trauma and postnatal complications. Women with chronic hypertension are at risk for superimposed preeclampsia, preterm delivery, fetal growth restriction and abruption.  [name] had diabetes teaching today with [facility]. She will call or email Vernona Rieger with her readings in one week. Will begin glyburide or insulin as indicated. Her BP today was 120/73 on Norvasc 5 mg daily. If necessary, this could be increased to 10 mg daily and then Labetalol could be added as a second agent. We will see her again in 4 weeks for a growth [place] and then testing will begin at 32 weeks. Suggest delivery by 39 weeks.  (Face-to-face consultation with patient: 30 min)

## 2011-11-05 ENCOUNTER — Encounter (HOSPITAL_COMMUNITY): Payer: Self-pay

## 2011-11-05 ENCOUNTER — Other Ambulatory Visit (HOSPITAL_COMMUNITY): Payer: Self-pay | Admitting: Maternal and Fetal Medicine

## 2011-11-05 ENCOUNTER — Ambulatory Visit (HOSPITAL_COMMUNITY)
Admission: RE | Admit: 2011-11-05 | Discharge: 2011-11-05 | Disposition: A | Discharge status: Home / Self Care | Payer: Medicaid Other | Source: Ambulatory Visit | Attending: Obstetrics | Admitting: Obstetrics

## 2011-11-05 DIAGNOSIS — O0990 Supervision of high risk pregnancy, unspecified, unspecified trimester: Secondary | ICD-10-CM

## 2011-11-05 DIAGNOSIS — O09299 Supervision of pregnancy with other poor reproductive or obstetric history, unspecified trimester: Secondary | ICD-10-CM | POA: Insufficient documentation

## 2011-11-05 DIAGNOSIS — I10 Essential (primary) hypertension: Secondary | ICD-10-CM

## 2011-11-05 DIAGNOSIS — O09529 Supervision of elderly multigravida, unspecified trimester: Secondary | ICD-10-CM | POA: Insufficient documentation

## 2011-11-05 DIAGNOSIS — Z363 Encounter for antenatal screening for malformations: Secondary | ICD-10-CM | POA: Insufficient documentation

## 2011-11-05 DIAGNOSIS — Z1389 Encounter for screening for other disorder: Secondary | ICD-10-CM | POA: Insufficient documentation

## 2011-11-05 DIAGNOSIS — E669 Obesity, unspecified: Secondary | ICD-10-CM | POA: Insufficient documentation

## 2011-11-05 DIAGNOSIS — O358XX0 Maternal care for other (suspected) fetal abnormality and damage, not applicable or unspecified: Secondary | ICD-10-CM | POA: Insufficient documentation

## 2011-11-05 DIAGNOSIS — O99810 Abnormal glucose complicating pregnancy: Secondary | ICD-10-CM | POA: Insufficient documentation

## 2011-11-05 DIAGNOSIS — O99210 Obesity complicating pregnancy, unspecified trimester: Secondary | ICD-10-CM | POA: Insufficient documentation

## 2011-11-05 DIAGNOSIS — O10019 Pre-existing essential hypertension complicating pregnancy, unspecified trimester: Secondary | ICD-10-CM | POA: Insufficient documentation

## 2011-11-10 ENCOUNTER — Ambulatory Visit (HOSPITAL_COMMUNITY)
Admission: RE | Admit: 2011-11-10 | Discharge: 2011-11-10 | Disposition: A | Discharge status: Home / Self Care | Payer: Medicaid Other | Source: Ambulatory Visit | Attending: Obstetrics | Admitting: Obstetrics

## 2011-11-10 ENCOUNTER — Other Ambulatory Visit (HOSPITAL_COMMUNITY): Payer: Self-pay | Admitting: Maternal and Fetal Medicine

## 2011-11-10 DIAGNOSIS — Z1389 Encounter for screening for other disorder: Secondary | ICD-10-CM | POA: Insufficient documentation

## 2011-11-10 DIAGNOSIS — O99810 Abnormal glucose complicating pregnancy: Secondary | ICD-10-CM | POA: Insufficient documentation

## 2011-11-10 DIAGNOSIS — I10 Essential (primary) hypertension: Secondary | ICD-10-CM

## 2011-11-10 DIAGNOSIS — O10019 Pre-existing essential hypertension complicating pregnancy, unspecified trimester: Secondary | ICD-10-CM | POA: Insufficient documentation

## 2011-11-10 DIAGNOSIS — O358XX0 Maternal care for other (suspected) fetal abnormality and damage, not applicable or unspecified: Secondary | ICD-10-CM | POA: Insufficient documentation

## 2011-11-10 DIAGNOSIS — Z363 Encounter for antenatal screening for malformations: Secondary | ICD-10-CM | POA: Insufficient documentation

## 2011-11-10 DIAGNOSIS — O09299 Supervision of pregnancy with other poor reproductive or obstetric history, unspecified trimester: Secondary | ICD-10-CM | POA: Insufficient documentation

## 2011-11-10 DIAGNOSIS — O09529 Supervision of elderly multigravida, unspecified trimester: Secondary | ICD-10-CM | POA: Insufficient documentation

## 2011-11-16 ENCOUNTER — Ambulatory Visit (HOSPITAL_COMMUNITY)
Admission: RE | Admit: 2011-11-16 | Discharge: 2011-11-16 | Disposition: A | Discharge status: Home / Self Care | Payer: Medicaid Other | Source: Ambulatory Visit | Attending: Obstetrics | Admitting: Obstetrics

## 2011-11-16 DIAGNOSIS — O99810 Abnormal glucose complicating pregnancy: Secondary | ICD-10-CM | POA: Insufficient documentation

## 2011-11-16 DIAGNOSIS — O10019 Pre-existing essential hypertension complicating pregnancy, unspecified trimester: Secondary | ICD-10-CM | POA: Insufficient documentation

## 2011-11-16 DIAGNOSIS — O09299 Supervision of pregnancy with other poor reproductive or obstetric history, unspecified trimester: Secondary | ICD-10-CM | POA: Insufficient documentation

## 2011-11-16 DIAGNOSIS — O09529 Supervision of elderly multigravida, unspecified trimester: Secondary | ICD-10-CM | POA: Insufficient documentation

## 2011-11-19 ENCOUNTER — Ambulatory Visit (HOSPITAL_COMMUNITY)
Admission: RE | Admit: 2011-11-19 | Discharge: 2011-11-19 | Disposition: A | Discharge status: Home / Self Care | Payer: Medicaid Other | Source: Ambulatory Visit | Attending: Obstetrics | Admitting: Obstetrics

## 2011-11-19 DIAGNOSIS — O09529 Supervision of elderly multigravida, unspecified trimester: Secondary | ICD-10-CM | POA: Insufficient documentation

## 2011-11-19 DIAGNOSIS — O99810 Abnormal glucose complicating pregnancy: Secondary | ICD-10-CM | POA: Insufficient documentation

## 2011-11-19 DIAGNOSIS — O09299 Supervision of pregnancy with other poor reproductive or obstetric history, unspecified trimester: Secondary | ICD-10-CM | POA: Insufficient documentation

## 2011-11-19 DIAGNOSIS — O10019 Pre-existing essential hypertension complicating pregnancy, unspecified trimester: Secondary | ICD-10-CM | POA: Insufficient documentation

## 2011-11-19 DIAGNOSIS — I10 Essential (primary) hypertension: Secondary | ICD-10-CM

## 2011-11-23 ENCOUNTER — Ambulatory Visit (HOSPITAL_COMMUNITY)
Admission: RE | Admit: 2011-11-23 | Discharge: 2011-11-23 | Disposition: A | Discharge status: Home / Self Care | Payer: Medicaid Other | Source: Ambulatory Visit | Attending: Obstetrics | Admitting: Obstetrics

## 2011-11-23 DIAGNOSIS — O09529 Supervision of elderly multigravida, unspecified trimester: Secondary | ICD-10-CM | POA: Insufficient documentation

## 2011-11-23 DIAGNOSIS — O10019 Pre-existing essential hypertension complicating pregnancy, unspecified trimester: Secondary | ICD-10-CM | POA: Insufficient documentation

## 2011-11-23 DIAGNOSIS — O99810 Abnormal glucose complicating pregnancy: Secondary | ICD-10-CM | POA: Insufficient documentation

## 2011-11-23 DIAGNOSIS — O09299 Supervision of pregnancy with other poor reproductive or obstetric history, unspecified trimester: Secondary | ICD-10-CM | POA: Insufficient documentation

## 2011-11-26 ENCOUNTER — Ambulatory Visit (HOSPITAL_COMMUNITY)
Admission: RE | Admit: 2011-11-26 | Discharge: 2011-11-26 | Disposition: A | Discharge status: Home / Self Care | Payer: Medicaid Other | Source: Ambulatory Visit | Attending: Obstetrics | Admitting: Obstetrics

## 2011-11-26 ENCOUNTER — Other Ambulatory Visit: Payer: Self-pay

## 2011-11-26 DIAGNOSIS — O09529 Supervision of elderly multigravida, unspecified trimester: Secondary | ICD-10-CM | POA: Insufficient documentation

## 2011-11-26 DIAGNOSIS — E669 Obesity, unspecified: Secondary | ICD-10-CM | POA: Insufficient documentation

## 2011-11-26 DIAGNOSIS — I10 Essential (primary) hypertension: Secondary | ICD-10-CM

## 2011-11-26 DIAGNOSIS — O99810 Abnormal glucose complicating pregnancy: Secondary | ICD-10-CM | POA: Insufficient documentation

## 2011-11-26 DIAGNOSIS — O10019 Pre-existing essential hypertension complicating pregnancy, unspecified trimester: Secondary | ICD-10-CM | POA: Insufficient documentation

## 2011-11-26 DIAGNOSIS — O09299 Supervision of pregnancy with other poor reproductive or obstetric history, unspecified trimester: Secondary | ICD-10-CM | POA: Insufficient documentation

## 2011-11-28 ENCOUNTER — Inpatient Hospital Stay (HOSPITAL_COMMUNITY)
Admission: AD | Admit: 2011-11-28 | Discharge: 2011-11-28 | Disposition: A | Discharge status: Home / Self Care | Payer: Medicaid Other | Source: Ambulatory Visit | Attending: Obstetrics | Admitting: Obstetrics

## 2011-11-28 ENCOUNTER — Encounter (HOSPITAL_COMMUNITY): Payer: Self-pay | Admitting: *Deleted

## 2011-11-28 DIAGNOSIS — J069 Acute upper respiratory infection, unspecified: Secondary | ICD-10-CM | POA: Insufficient documentation

## 2011-11-28 DIAGNOSIS — O99810 Abnormal glucose complicating pregnancy: Secondary | ICD-10-CM | POA: Insufficient documentation

## 2011-11-28 DIAGNOSIS — O24419 Gestational diabetes mellitus in pregnancy, unspecified control: Secondary | ICD-10-CM

## 2011-11-28 DIAGNOSIS — R509 Fever, unspecified: Secondary | ICD-10-CM | POA: Insufficient documentation

## 2011-11-28 DIAGNOSIS — O99891 Other specified diseases and conditions complicating pregnancy: Secondary | ICD-10-CM | POA: Insufficient documentation

## 2011-11-28 DIAGNOSIS — O479 False labor, unspecified: Secondary | ICD-10-CM

## 2011-11-28 LAB — INFLUENZA PANEL BY PCR (TYPE A & B)
Influenza A By PCR: NEGATIVE
Influenza B By PCR: NEGATIVE

## 2011-11-28 LAB — CBC
MCV: 87.4 fL (ref 78.0–100.0)
Platelets: 245 10*3/uL (ref 150–400)
RBC: 3.65 MIL/uL — ABNORMAL LOW (ref 3.87–5.11)
WBC: 8.1 10*3/uL (ref 4.0–10.5)

## 2011-11-28 LAB — COMPREHENSIVE METABOLIC PANEL
ALT: 18 U/L (ref 0–35)
AST: 19 U/L (ref 0–37)
Alkaline Phosphatase: 92 U/L (ref 39–117)
CO2: 16 mEq/L — ABNORMAL LOW (ref 19–32)
Chloride: 100 mEq/L (ref 96–112)
GFR calc non Af Amer: >90 mL/min (ref >90)
Sodium: 131 mEq/L — ABNORMAL LOW (ref 135–145)
Total Bilirubin: 0.2 mg/dL — ABNORMAL LOW (ref 0.3–1.2)

## 2011-11-28 LAB — URINALYSIS, ROUTINE W REFLEX MICROSCOPIC
Bilirubin Urine: NEGATIVE
Ketones, ur: NEGATIVE mg/dL
Nitrite: NEGATIVE
pH: 5 (ref 5.0–8.0)

## 2011-11-28 MED ORDER — HYDROCOD POLST-CHLORPHEN POLST 10-8 MG/5ML PO LQCR
5.0000 mL | Freq: Once | ORAL | Status: AC
  Administered 2011-11-28: 5 mL via ORAL
  Filled 2011-11-28: qty 5

## 2011-11-28 MED ORDER — AZITHROMYCIN 250 MG PO TABS: 500.0000 mg | ORAL_TABLET | Freq: Once | ORAL | Status: DC

## 2011-11-28 MED ORDER — AZITHROMYCIN 250 MG PO TABS: ORAL_TABLET | ORAL | Status: AC

## 2011-11-28 MED ORDER — PHENOL 1.4 % MT LIQD
1.0000 | OROMUCOSAL | Status: DC | PRN
  Administered 2011-11-28: 1 via OROMUCOSAL
  Filled 2011-11-28: qty 177

## 2011-11-28 MED ORDER — DM-GUAIFENESIN ER 30-600 MG PO TB12: 1.0000 | ORAL_TABLET | Freq: Two times a day (BID) | ORAL | Status: AC

## 2011-11-28 MED ORDER — HYDROCOD POLST-CHLORPHEN POLST 10-8 MG/5ML PO LQCR: 5.0000 mL | Freq: Once | ORAL | Status: DC

## 2011-11-28 MED ORDER — DM-GUAIFENESIN ER 30-600 MG PO TB12
1.0000 | ORAL_TABLET | Freq: Two times a day (BID) | ORAL | Status: DC
  Administered 2011-11-28: 1 via ORAL
  Filled 2011-11-28 (×4): qty 1

## 2011-11-28 MED ORDER — HYDROCOD POLST-CHLORPHEN POLST 10-8 MG/5ML PO LQCR: 5.0000 mL | Freq: Two times a day (BID) | ORAL | Status: DC

## 2011-11-28 MED ORDER — ACETAMINOPHEN 325 MG PO TABS
650.0000 mg | ORAL_TABLET | Freq: Once | ORAL | Status: AC
  Administered 2011-11-28: 650 mg via ORAL
  Filled 2011-11-28: qty 2

## 2011-11-28 NOTE — ED Provider Notes (Signed)
History     Chief Complaint  Patient presents with  . Contractions  . Fever  . Cough   HPI  Pt is [redacted]w[redacted]d pregnant and presents with dry hacking no productive cough that she has had for 2 weeks.  She started running a fever tonight 100.61F tonight at home.  She has had chills and aches with fever.  She has occ contractions.  She does not have nasal congestions at this time.   Baby has been active.  She denies leakage of fluid. She has been taking Robitussin last took at 11 pm and also took some Tylenol around 8 pm.  Pt is known diabetic and has been drinking ginger ale for several days.  Past Medical History  Diagnosis Date  . Hypertension   . Diabetes in pregnancy   . Migraines   . Ovarian cyst   . Chlamydia   . Trichomonas     Past Surgical History  Procedure Date  . Cholecystectomy 02/2009    Family History  Problem Relation Age of Onset  . Hypertension Maternal Aunt   . Hypertension Paternal Aunt   . Anesthesia problems Neg Hx   . Hypotension Neg Hx   . Malignant hyperthermia Neg Hx   . Pseudochol deficiency Neg Hx     History  Substance Use Topics  . Smoking status: Former Smoker -- 0.0 packs/day for 16 years    Types: Cigarettes    Quit date: 05/12/2011  . Smokeless tobacco: Never Used  . Alcohol Use: 1.2 oz/week    1 Glasses of wine, 1 Shots of liquor per week     Occasional, Quit with (+) UPT    Allergies:  Allergies  Allergen Reactions  . Amoxicillin Itching    welps    Prescriptions prior to admission  Medication Sig Dispense Refill  . amLODipine (NORVASC) 5 MG tablet Take 5 mg by mouth daily.        Marland Kitchen guaiFENesin (ROBITUSSIN) 100 MG/5ML liquid Take 200 mg by mouth 3 (three) times daily as needed.        . ondansetron (ZOFRAN-ODT) 4 MG disintegrating tablet Take 4 mg by mouth every 4 (four) hours as needed. nausea       . prenatal vitamin w/FE, FA (PRENATAL 1 + 1) 27-1 MG TABS Take 1 tablet by mouth daily.        . promethazine (PHENERGAN) 25 MG  suppository Place 25 mg rectally every 6 (six) hours as needed. Nausea/vomiting         ROS Physical Exam   Blood pressure 129/77, pulse 129, temperature 100.5 F (38.1 C), temperature source Oral, resp. rate 20, height 5' 2.5" (1.588 m), weight 235 lb 8 oz (106.822 kg), last menstrual period 04/01/2011, SpO2 97.00%.  Physical Exam  Vitals reviewed. Constitutional: She is oriented to person, place, and time. She appears well-developed and well-nourished.  HENT:  Head: Normocephalic.  Eyes: Pupils are equal, round, and reactive to light.  Neck: Normal range of motion. Neck supple.  Cardiovascular: Regular rhythm and normal heart sounds.        Tachycardic   Respiratory: Effort normal and breath sounds normal.  GI: Soft.       No contractions; FHR 175 bpm  Musculoskeletal: Normal range of motion.  Neurological: She is alert and oriented to person, place, and time.  Skin: Skin is warm and dry.  Psychiatric: She has a normal mood and affect.    MAU Course  Procedures Tussionex relieved pt's cough Flu  screen- pending- informed by lab that only performed during week Pt's pulse 121-129 Temp 100.5 FHR 180 bpm Dr. Gaynell Face called and discussed  Pt monitored until pt afebrile temp 98 F and pulse 88 FHR 150 with reactive tracing within past hour Cough returned- Mucinex DM ordered Zithromax 500 mg given in MAU NST nonreactive- BPP ordered Discharge discontinued Strip became reactive; BPP order discontinued and pt discharged home   Assessment and Plan  URI with fever in pregnancy Gestational Diabetes D/c home with prescription for Zithromax Pt will also be given a prescription for Tussionex to take every 12 hours since it helped in MAU Pt may take Mucinex DM and Tylenol Pt to f/u with Dr. Ellin Goodie 11/28/2011, 1:41 AM

## 2011-11-28 NOTE — Progress Notes (Signed)
Pt states, " I had a cough 2 wks ago, but it came back last night, and this morning I woke up with aching in the back of my neck, and I feel achy all over.I checked my temperature and it was 100.1"

## 2011-11-30 ENCOUNTER — Ambulatory Visit (HOSPITAL_COMMUNITY)
Admission: RE | Admit: 2011-11-30 | Discharge: 2011-11-30 | Disposition: A | Discharge status: Home / Self Care | Payer: Medicaid Other | Source: Ambulatory Visit

## 2011-11-30 ENCOUNTER — Other Ambulatory Visit (HOSPITAL_COMMUNITY): Payer: Self-pay | Admitting: Maternal and Fetal Medicine

## 2011-11-30 ENCOUNTER — Ambulatory Visit (HOSPITAL_COMMUNITY)
Admission: RE | Admit: 2011-11-30 | Discharge: 2011-11-30 | Disposition: A | Discharge status: Home / Self Care | Payer: Medicaid Other | Source: Ambulatory Visit | Attending: Obstetrics | Admitting: Obstetrics

## 2011-11-30 DIAGNOSIS — O09529 Supervision of elderly multigravida, unspecified trimester: Secondary | ICD-10-CM | POA: Insufficient documentation

## 2011-11-30 DIAGNOSIS — O99810 Abnormal glucose complicating pregnancy: Secondary | ICD-10-CM | POA: Insufficient documentation

## 2011-11-30 DIAGNOSIS — O10019 Pre-existing essential hypertension complicating pregnancy, unspecified trimester: Secondary | ICD-10-CM | POA: Insufficient documentation

## 2011-11-30 DIAGNOSIS — E669 Obesity, unspecified: Secondary | ICD-10-CM | POA: Insufficient documentation

## 2011-11-30 DIAGNOSIS — I10 Essential (primary) hypertension: Secondary | ICD-10-CM

## 2011-11-30 DIAGNOSIS — O99210 Obesity complicating pregnancy, unspecified trimester: Secondary | ICD-10-CM | POA: Insufficient documentation

## 2011-11-30 NOTE — Progress Notes (Signed)
NST non reactive today.  BPP performed.  Results 8/8.  Please see report in [facility].

## 2011-12-03 ENCOUNTER — Other Ambulatory Visit (HOSPITAL_COMMUNITY): Payer: Self-pay | Admitting: Maternal and Fetal Medicine

## 2011-12-03 ENCOUNTER — Ambulatory Visit (HOSPITAL_COMMUNITY)
Admission: RE | Admit: 2011-12-03 | Discharge: 2011-12-03 | Disposition: A | Discharge status: Home / Self Care | Payer: Medicaid Other | Source: Ambulatory Visit | Attending: Obstetrics | Admitting: Obstetrics

## 2011-12-03 DIAGNOSIS — O24419 Gestational diabetes mellitus in pregnancy, unspecified control: Secondary | ICD-10-CM

## 2011-12-03 DIAGNOSIS — O09529 Supervision of elderly multigravida, unspecified trimester: Secondary | ICD-10-CM

## 2011-12-03 DIAGNOSIS — I10 Essential (primary) hypertension: Secondary | ICD-10-CM

## 2011-12-03 DIAGNOSIS — E669 Obesity, unspecified: Secondary | ICD-10-CM | POA: Insufficient documentation

## 2011-12-03 DIAGNOSIS — O99810 Abnormal glucose complicating pregnancy: Secondary | ICD-10-CM | POA: Insufficient documentation

## 2011-12-03 DIAGNOSIS — O10019 Pre-existing essential hypertension complicating pregnancy, unspecified trimester: Secondary | ICD-10-CM | POA: Insufficient documentation

## 2011-12-03 DIAGNOSIS — O99210 Obesity complicating pregnancy, unspecified trimester: Secondary | ICD-10-CM | POA: Insufficient documentation

## 2011-12-03 NOTE — Progress Notes (Signed)
Obstetric ultrasound completed today.  Please see report in [facility]. 

## 2011-12-07 ENCOUNTER — Ambulatory Visit (HOSPITAL_COMMUNITY): Payer: Medicaid Other

## 2011-12-07 ENCOUNTER — Ambulatory Visit (HOSPITAL_COMMUNITY)
Admission: RE | Admit: 2011-12-07 | Discharge: 2011-12-07 | Disposition: A | Discharge status: Home / Self Care | Payer: Medicaid Other | Source: Ambulatory Visit | Attending: Obstetrics | Admitting: Obstetrics

## 2011-12-07 ENCOUNTER — Other Ambulatory Visit (HOSPITAL_COMMUNITY): Payer: Medicaid Other

## 2011-12-07 DIAGNOSIS — O09529 Supervision of elderly multigravida, unspecified trimester: Secondary | ICD-10-CM | POA: Insufficient documentation

## 2011-12-07 DIAGNOSIS — O99810 Abnormal glucose complicating pregnancy: Secondary | ICD-10-CM | POA: Insufficient documentation

## 2011-12-07 DIAGNOSIS — E669 Obesity, unspecified: Secondary | ICD-10-CM | POA: Insufficient documentation

## 2011-12-07 DIAGNOSIS — O10019 Pre-existing essential hypertension complicating pregnancy, unspecified trimester: Secondary | ICD-10-CM | POA: Insufficient documentation

## 2011-12-10 ENCOUNTER — Ambulatory Visit (HOSPITAL_COMMUNITY)
Admission: RE | Admit: 2011-12-10 | Discharge: 2011-12-10 | Disposition: A | Discharge status: Home / Self Care | Payer: Medicaid Other | Source: Ambulatory Visit | Attending: Obstetrics | Admitting: Obstetrics

## 2011-12-10 ENCOUNTER — Other Ambulatory Visit (HOSPITAL_COMMUNITY): Payer: Medicaid Other

## 2011-12-10 DIAGNOSIS — I10 Essential (primary) hypertension: Secondary | ICD-10-CM

## 2011-12-10 DIAGNOSIS — O09529 Supervision of elderly multigravida, unspecified trimester: Secondary | ICD-10-CM | POA: Insufficient documentation

## 2011-12-10 DIAGNOSIS — O99810 Abnormal glucose complicating pregnancy: Secondary | ICD-10-CM | POA: Insufficient documentation

## 2011-12-10 DIAGNOSIS — O10019 Pre-existing essential hypertension complicating pregnancy, unspecified trimester: Secondary | ICD-10-CM | POA: Insufficient documentation

## 2011-12-10 DIAGNOSIS — E669 Obesity, unspecified: Secondary | ICD-10-CM | POA: Insufficient documentation

## 2011-12-17 ENCOUNTER — Other Ambulatory Visit (HOSPITAL_COMMUNITY): Payer: Self-pay | Admitting: Obstetrics

## 2011-12-17 ENCOUNTER — Ambulatory Visit (HOSPITAL_COMMUNITY)
Admission: RE | Admit: 2011-12-17 | Discharge: 2011-12-17 | Disposition: A | Discharge status: Home / Self Care | Payer: Medicaid Other | Source: Ambulatory Visit | Attending: Obstetrics | Admitting: Obstetrics

## 2011-12-17 ENCOUNTER — Encounter (HOSPITAL_COMMUNITY): Payer: Self-pay

## 2011-12-17 DIAGNOSIS — O09529 Supervision of elderly multigravida, unspecified trimester: Secondary | ICD-10-CM

## 2011-12-17 DIAGNOSIS — O10019 Pre-existing essential hypertension complicating pregnancy, unspecified trimester: Secondary | ICD-10-CM | POA: Insufficient documentation

## 2011-12-17 DIAGNOSIS — O99210 Obesity complicating pregnancy, unspecified trimester: Secondary | ICD-10-CM | POA: Insufficient documentation

## 2011-12-17 DIAGNOSIS — O99810 Abnormal glucose complicating pregnancy: Secondary | ICD-10-CM | POA: Insufficient documentation

## 2011-12-17 DIAGNOSIS — E669 Obesity, unspecified: Secondary | ICD-10-CM | POA: Insufficient documentation

## 2011-12-17 DIAGNOSIS — O368190 Decreased fetal movements, unspecified trimester, not applicable or unspecified: Secondary | ICD-10-CM | POA: Insufficient documentation

## 2011-12-18 ENCOUNTER — Other Ambulatory Visit (HOSPITAL_COMMUNITY): Payer: Self-pay | Admitting: Obstetrics

## 2011-12-18 DIAGNOSIS — O10019 Pre-existing essential hypertension complicating pregnancy, unspecified trimester: Secondary | ICD-10-CM

## 2011-12-18 DIAGNOSIS — O368190 Decreased fetal movements, unspecified trimester, not applicable or unspecified: Secondary | ICD-10-CM

## 2011-12-18 DIAGNOSIS — O09529 Supervision of elderly multigravida, unspecified trimester: Secondary | ICD-10-CM

## 2011-12-18 DIAGNOSIS — O99810 Abnormal glucose complicating pregnancy: Secondary | ICD-10-CM

## 2011-12-21 ENCOUNTER — Ambulatory Visit (HOSPITAL_COMMUNITY)
Admission: RE | Admit: 2011-12-21 | Discharge: 2011-12-21 | Disposition: A | Discharge status: Home / Self Care | Payer: Medicaid Other | Source: Ambulatory Visit | Attending: Obstetrics | Admitting: Obstetrics

## 2011-12-21 ENCOUNTER — Other Ambulatory Visit (HOSPITAL_COMMUNITY): Payer: Self-pay | Admitting: Obstetrics

## 2011-12-21 DIAGNOSIS — O99810 Abnormal glucose complicating pregnancy: Secondary | ICD-10-CM | POA: Insufficient documentation

## 2011-12-21 DIAGNOSIS — O10019 Pre-existing essential hypertension complicating pregnancy, unspecified trimester: Secondary | ICD-10-CM

## 2011-12-21 DIAGNOSIS — O368190 Decreased fetal movements, unspecified trimester, not applicable or unspecified: Secondary | ICD-10-CM

## 2011-12-21 DIAGNOSIS — O09529 Supervision of elderly multigravida, unspecified trimester: Secondary | ICD-10-CM

## 2011-12-21 DIAGNOSIS — I10 Essential (primary) hypertension: Secondary | ICD-10-CM

## 2011-12-21 DIAGNOSIS — E669 Obesity, unspecified: Secondary | ICD-10-CM | POA: Insufficient documentation

## 2011-12-21 DIAGNOSIS — O288 Other abnormal findings on antenatal screening of mother: Secondary | ICD-10-CM

## 2011-12-22 ENCOUNTER — Inpatient Hospital Stay (HOSPITAL_COMMUNITY)
Admission: AD | Admit: 2011-12-22 | Discharge: 2011-12-22 | Disposition: A | Discharge status: Home Health | Payer: Medicaid Other | Source: Ambulatory Visit | Attending: Obstetrics | Admitting: Obstetrics

## 2011-12-22 ENCOUNTER — Ambulatory Visit (HOSPITAL_COMMUNITY)
Admission: RE | Admit: 2011-12-22 | Discharge: 2011-12-22 | Disposition: A | Discharge status: Home / Self Care | Payer: Medicaid Other | Source: Ambulatory Visit | Attending: Obstetrics | Admitting: Obstetrics

## 2011-12-22 DIAGNOSIS — O99210 Obesity complicating pregnancy, unspecified trimester: Secondary | ICD-10-CM | POA: Insufficient documentation

## 2011-12-22 DIAGNOSIS — O10019 Pre-existing essential hypertension complicating pregnancy, unspecified trimester: Secondary | ICD-10-CM | POA: Insufficient documentation

## 2011-12-22 DIAGNOSIS — O368190 Decreased fetal movements, unspecified trimester, not applicable or unspecified: Secondary | ICD-10-CM | POA: Insufficient documentation

## 2011-12-22 DIAGNOSIS — O99810 Abnormal glucose complicating pregnancy: Secondary | ICD-10-CM | POA: Insufficient documentation

## 2011-12-22 DIAGNOSIS — O09529 Supervision of elderly multigravida, unspecified trimester: Secondary | ICD-10-CM | POA: Insufficient documentation

## 2011-12-22 DIAGNOSIS — E669 Obesity, unspecified: Secondary | ICD-10-CM | POA: Insufficient documentation

## 2011-12-22 DIAGNOSIS — I10 Essential (primary) hypertension: Secondary | ICD-10-CM

## 2011-12-22 NOTE — Plan of Care (Signed)
Patient was scheduled for an outpatient ultrasound by MFM on 12-31 for today.

## 2011-12-22 NOTE — L&D Delivery Note (Signed)
Cesarean Section Procedure Note  Indications: non-reassuring fetal status  Pre-operative Diagnosis: 38 week 4 day pregnancy.  Gestational Diabetes - diet controlled.  Polyhydramnios.  Decreased fetal movement.  Nonreassuring fetal tracing with markedly decreased variability and occasional late deceleration.  Post-operative Diagnosis: same  Surgeon: Laquasha Groome A   Assistants: Surgical Technician  Anesthesia: Spinal anesthesia  ASA Class: 2   Procedure Details   The patient was seen in the Holding Room. The risks, benefits, complications, treatment options, and expected outcomes were discussed with the patient.  The patient concurred with the proposed plan, giving informed consent.  The site of surgery properly noted/marked. The patient was taken to Operating Room # 1, identified as Meredith York and the procedure verified as C-Section Delivery. A Time Out was held and the above information confirmed.  After induction of anesthesia, the patient was draped and prepped in the usual sterile manner. A Pfannenstiel incision was made and carried down through the subcutaneous tissue to the fascia. Fascial incision was made and extended transversely. The fascia was separated from the underlying rectus tissue superiorly and inferiorly. The peritoneum was identified and entered. Peritoneal incision was extended longitudinally. The utero-vesical peritoneal reflection was incised transversely and the bladder flap was bluntly freed from the lower uterine segment. A low transverse uterine incision was made. Delivered from cephalic presentation was a 3696 gram Female with Apgar scores of 3 at one minute and 3 at five minutes and 6 at 10 minutes. After the umbilical cord was clamped and cut cord blood was obtained for evaluation. The placenta was removed intact and appeared normal. The uterine outline, tubes and ovaries appeared normal. The uterine incision was closed with running locked sutures of 0 -  monocryl. Hemostasis was observed. Lavage was carried out until clear.  The peritoneum closed with 2 - vicryl. The fascia was then reapproximated with running sutures of Vicryl. The skin was reapproximated with Staples.  Instrument, sponge, and needle counts were correct prior the abdominal closure and at the conclusion of the case.   Findings:   Estimated Blood Loss:  1200 ml.         Drains: none         Total IV Fluids:          Specimens: Placenta          Implants: none         Complications:  None; patient tolerated the procedure well.         Disposition: PACU - hemodynamically stable.         Condition: stable  Attending Attestation: I was present and scrubbed for the entire procedure.

## 2011-12-24 ENCOUNTER — Ambulatory Visit (HOSPITAL_COMMUNITY)
Admission: RE | Admit: 2011-12-24 | Discharge: 2011-12-24 | Disposition: A | Discharge status: Home / Self Care | Payer: Medicaid Other | Source: Ambulatory Visit | Attending: Obstetrics | Admitting: Obstetrics

## 2011-12-24 ENCOUNTER — Other Ambulatory Visit (HOSPITAL_COMMUNITY): Payer: Self-pay | Admitting: Obstetrics

## 2011-12-24 DIAGNOSIS — O10019 Pre-existing essential hypertension complicating pregnancy, unspecified trimester: Secondary | ICD-10-CM | POA: Insufficient documentation

## 2011-12-24 DIAGNOSIS — O288 Other abnormal findings on antenatal screening of mother: Secondary | ICD-10-CM

## 2011-12-24 DIAGNOSIS — E669 Obesity, unspecified: Secondary | ICD-10-CM | POA: Insufficient documentation

## 2011-12-24 DIAGNOSIS — O368190 Decreased fetal movements, unspecified trimester, not applicable or unspecified: Secondary | ICD-10-CM

## 2011-12-24 DIAGNOSIS — O99810 Abnormal glucose complicating pregnancy: Secondary | ICD-10-CM | POA: Insufficient documentation

## 2011-12-24 DIAGNOSIS — O99210 Obesity complicating pregnancy, unspecified trimester: Secondary | ICD-10-CM | POA: Insufficient documentation

## 2011-12-24 DIAGNOSIS — O09529 Supervision of elderly multigravida, unspecified trimester: Secondary | ICD-10-CM

## 2011-12-24 NOTE — Progress Notes (Signed)
NST nonreactive today.  BPP performed.  Results 8/8.  Please see full report in [facility].

## 2011-12-26 ENCOUNTER — Encounter (HOSPITAL_COMMUNITY): Payer: Self-pay | Admitting: *Deleted

## 2011-12-26 ENCOUNTER — Inpatient Hospital Stay (HOSPITAL_COMMUNITY)
Admission: AD | Admit: 2011-12-26 | Discharge: 2011-12-26 | Disposition: A | Discharge status: Home / Self Care | Payer: Medicaid Other | Source: Ambulatory Visit | Attending: Obstetrics | Admitting: Obstetrics

## 2011-12-26 ENCOUNTER — Inpatient Hospital Stay (HOSPITAL_COMMUNITY): Payer: Medicaid Other

## 2011-12-26 DIAGNOSIS — O409XX0 Polyhydramnios, unspecified trimester, not applicable or unspecified: Secondary | ICD-10-CM | POA: Insufficient documentation

## 2011-12-26 DIAGNOSIS — O368190 Decreased fetal movements, unspecified trimester, not applicable or unspecified: Secondary | ICD-10-CM | POA: Insufficient documentation

## 2011-12-26 DIAGNOSIS — O479 False labor, unspecified: Secondary | ICD-10-CM | POA: Insufficient documentation

## 2011-12-26 NOTE — Progress Notes (Addendum)
Decreased FM for 2 days.  Last two appts at [facility] showed NR-NST, f/u with BPP each time.  Last Tues, BPP had to be repeated, Thursday BPP normal, but baby slow to move.

## 2011-12-26 NOTE — Progress Notes (Signed)
Pt reports not feeling baby move since yesterday. Also c/o ctx 5-10 min. Reports small amount of clear mucusy discharge but no bleeding at this time

## 2011-12-26 NOTE — ED Provider Notes (Signed)
Chief Complaint:  Decreased Fetal Movement and Contractions   Meredith York is  36 y.o. [id number] at [redacted]w[redacted]d presents complaining of Decreased Fetal Movement and Contractions .  She states irregular, every 3-6 minutes contractions associated with none vaginal bleeding, intact membranes, along with decreased  fetal movement. She had a BPP 2 days ago that was 8/8  Obstetrical/Gynecological History: Menstrual History: OB History    Grav Para Term Preterm Abortions TAB SAB Ect Mult Living   4 2 2  1 1    2       Patient's last menstrual period was 04/01/2011.     Past Medical History: Past Medical History  Diagnosis Date  . Hypertension   . Diabetes in pregnancy   . Migraines   . Ovarian cyst   . Chlamydia   . Trichomonas     Past Surgical History: Past Surgical History  Procedure Date  . Cholecystectomy 02/2009    Family History: Family History  Problem Relation Age of Onset  . Hypertension Maternal Aunt   . Hypertension Paternal Aunt   . Anesthesia problems Neg Hx   . Hypotension Neg Hx   . Malignant hyperthermia Neg Hx   . Pseudochol deficiency Neg Hx     Social History: History  Substance Use Topics  . Smoking status: Former Smoker -- 0.0 packs/day for 16 years    Types: Cigarettes    Quit date: 05/12/2011  . Smokeless tobacco: Never Used  . Alcohol Use: 1.2 oz/week    1 Glasses of wine, 1 Shots of liquor per week     Occasional, Quit with (+) UPT    Allergies:  Allergies  Allergen Reactions  . Amoxicillin Itching    welps    Meds:  Prescriptions prior to admission  Medication Sig Dispense Refill  . amLODipine (NORVASC) 5 MG tablet Take 10 mg by mouth daily. Dose was increased by Dr. Gaynell Face on 12.27 per patient.      . chlorpheniramine-HYDROcodone (TUSSIONEX PENNKINETIC ER) 10-8 MG/5ML LQCR Take 5 mLs by mouth every 12 (twelve) hours.  140 mL  0  . guaiFENesin (ROBITUSSIN) 100 MG/5ML liquid Take 200 mg by mouth 3 (three) times daily as needed.          . ondansetron (ZOFRAN-ODT) 4 MG disintegrating tablet Take 4 mg by mouth every 4 (four) hours as needed. nausea       . prenatal vitamin w/FE, FA (PRENATAL 1 + 1) 27-1 MG TABS Take 1 tablet by mouth daily.        . promethazine (PHENERGAN) 25 MG suppository Place 25 mg rectally every 6 (six) hours as needed. Nausea/vomiting         Review of Systems - Please refer to the aforementioned patients' reports.     Physical Exam  Blood pressure 160/94, pulse 98, temperature 98.5 F (36.9 C), temperature source Oral, resp. rate 18, height 5' 2.5" (1.588 m), weight 107.684 kg (237 lb 6.4 oz), last menstrual period 04/01/2011. GENERAL: Well-developed, well-nourished female in no acute distress.  LUNGS: Clear to auscultation bilaterally.  HEART: Regular rate and rhythm. ABDOMEN: Soft, nontender, nondistended, gravid.  EXTREMITIES: Nontender, no edema, 2+ distal pulses. Cervical Exam: Dilatation 1cm   Effacement 20%   Station -3   Presentation: cephalic FHT:  Baseline rate 150 bpm   Variability moderate  Accelerations absent   Decelerations none Contractions: Every 3 mins   Ultrasound shows increased AFI from 12/24/11 22cm now 30 cm.  8/8 BPP  Assessment: Meredith York is  36 y.o. [id number] at [redacted]w[redacted]d presents with decreased fetal movement, uterine irritability most likely 2/2 polyhydramnios.  Plan: Dr. Clearance Coots notified, recommended pt keep her appt with Dr. Gaynell Face on monday  CRESENZO-DISHMAN,Millicent Blazejewski 1/5/20133:55 PM

## 2011-12-27 ENCOUNTER — Inpatient Hospital Stay (HOSPITAL_COMMUNITY): Payer: Medicaid Other

## 2011-12-27 ENCOUNTER — Encounter (HOSPITAL_COMMUNITY)
Admission: AD | Disposition: A | Discharge status: Home / Self Care | Payer: Self-pay | Source: Ambulatory Visit | Attending: Obstetrics

## 2011-12-27 ENCOUNTER — Encounter (HOSPITAL_COMMUNITY): Payer: Self-pay | Admitting: *Deleted

## 2011-12-27 ENCOUNTER — Inpatient Hospital Stay (HOSPITAL_COMMUNITY): Payer: Medicaid Other | Admitting: Anesthesiology

## 2011-12-27 ENCOUNTER — Inpatient Hospital Stay (HOSPITAL_COMMUNITY)
Admission: AD | Admit: 2011-12-27 | Discharge: 2011-12-30 | DRG: 765 | Disposition: A | Discharge status: Home / Self Care | Payer: Medicaid Other | Source: Ambulatory Visit | Attending: Obstetrics | Admitting: Obstetrics

## 2011-12-27 ENCOUNTER — Encounter (HOSPITAL_COMMUNITY): Payer: Self-pay | Admitting: Anesthesiology

## 2011-12-27 ENCOUNTER — Other Ambulatory Visit: Payer: Self-pay | Admitting: Obstetrics

## 2011-12-27 DIAGNOSIS — I10 Essential (primary) hypertension: Secondary | ICD-10-CM

## 2011-12-27 DIAGNOSIS — O409XX0 Polyhydramnios, unspecified trimester, not applicable or unspecified: Secondary | ICD-10-CM | POA: Diagnosis present

## 2011-12-27 DIAGNOSIS — O99814 Abnormal glucose complicating childbirth: Secondary | ICD-10-CM | POA: Diagnosis present

## 2011-12-27 DIAGNOSIS — R51 Headache: Secondary | ICD-10-CM

## 2011-12-27 DIAGNOSIS — O368190 Decreased fetal movements, unspecified trimester, not applicable or unspecified: Secondary | ICD-10-CM | POA: Diagnosis present

## 2011-12-27 DIAGNOSIS — Z98891 History of uterine scar from previous surgery: Secondary | ICD-10-CM

## 2011-12-27 DIAGNOSIS — O0990 Supervision of high risk pregnancy, unspecified, unspecified trimester: Secondary | ICD-10-CM

## 2011-12-27 LAB — COMPREHENSIVE METABOLIC PANEL
Albumin: 2 g/dL — ABNORMAL LOW (ref 3.5–5.2)
Alkaline Phosphatase: 120 U/L — ABNORMAL HIGH (ref 39–117)
BUN: 6 mg/dL (ref 6–23)
Calcium: 9.6 mg/dL (ref 8.4–10.5)
GFR calc Af Amer: >90 mL/min (ref >90)
Glucose, Bld: 155 mg/dL — ABNORMAL HIGH (ref 70–99)
Potassium: 3.7 mEq/L (ref 3.5–5.1)
Total Protein: 6.1 g/dL (ref 6.0–8.3)

## 2011-12-27 LAB — GLUCOSE, CAPILLARY: Glucose-Capillary: 146 mg/dL — ABNORMAL HIGH (ref 70–99)

## 2011-12-27 SURGERY — Surgical Case
Anesthesia: Regional | Site: Abdomen | Wound class: Clean Contaminated

## 2011-12-27 MED ORDER — METHYLERGONOVINE MALEATE 0.2 MG/ML IJ SOLN: 0.2000 mg | INTRAMUSCULAR | Status: DC | PRN

## 2011-12-27 MED ORDER — KETOROLAC TROMETHAMINE 30 MG/ML IJ SOLN: 30.0000 mg | Freq: Four times a day (QID) | INTRAMUSCULAR | Status: AC | PRN

## 2011-12-27 MED ORDER — MENTHOL 3 MG MT LOZG: 1.0000 | LOZENGE | OROMUCOSAL | Status: DC | PRN

## 2011-12-27 MED ORDER — MORPHINE SULFATE (PF) 0.5 MG/ML IJ SOLN
INTRAMUSCULAR | Status: DC | PRN
  Administered 2011-12-27: .5 mg via INTRATHECAL

## 2011-12-27 MED ORDER — PHENYLEPHRINE 40 MCG/ML (10ML) SYRINGE FOR IV PUSH (FOR BLOOD PRESSURE SUPPORT)
PREFILLED_SYRINGE | INTRAVENOUS | Status: AC
  Filled 2011-12-27: qty 10

## 2011-12-27 MED ORDER — METOCLOPRAMIDE HCL 5 MG/ML IJ SOLN: 10.0000 mg | Freq: Three times a day (TID) | INTRAMUSCULAR | Status: DC | PRN

## 2011-12-27 MED ORDER — MORPHINE SULFATE 0.5 MG/ML IJ SOLN
INTRAMUSCULAR | Status: AC
  Filled 2011-12-27: qty 10

## 2011-12-27 MED ORDER — SIMETHICONE 80 MG PO CHEW
80.0000 mg | CHEWABLE_TABLET | Freq: Three times a day (TID) | ORAL | Status: DC
  Administered 2011-12-28 – 2011-12-29 (×5): 80 mg via ORAL

## 2011-12-27 MED ORDER — NALBUPHINE SYRINGE 5 MG/0.5 ML
5.0000 mg | INJECTION | INTRAMUSCULAR | Status: DC | PRN
  Filled 2011-12-27: qty 1

## 2011-12-27 MED ORDER — OXYTOCIN 10 UNIT/ML IJ SOLN
20.0000 [IU] | INTRAVENOUS | Status: DC | PRN
  Administered 2011-12-27: 20 [IU] via INTRAVENOUS

## 2011-12-27 MED ORDER — ONDANSETRON HCL 4 MG/2ML IJ SOLN: 4.0000 mg | Freq: Three times a day (TID) | INTRAMUSCULAR | Status: DC | PRN

## 2011-12-27 MED ORDER — MEPERIDINE HCL 25 MG/ML IJ SOLN
INTRAMUSCULAR | Status: DC | PRN
  Administered 2011-12-27: 25 mg via INTRAVENOUS

## 2011-12-27 MED ORDER — ONDANSETRON HCL 4 MG/2ML IJ SOLN
INTRAMUSCULAR | Status: AC
  Filled 2011-12-27: qty 2

## 2011-12-27 MED ORDER — MEPERIDINE HCL 25 MG/ML IJ SOLN
INTRAMUSCULAR | Status: AC
  Filled 2011-12-27: qty 1

## 2011-12-27 MED ORDER — PROPOFOL 10 MG/ML IV EMUL
INTRAVENOUS | Status: AC
  Filled 2011-12-27: qty 20

## 2011-12-27 MED ORDER — LACTATED RINGERS IV SOLN
INTRAVENOUS | Status: DC | PRN
  Administered 2011-12-27 (×2): via INTRAVENOUS

## 2011-12-27 MED ORDER — SUCCINYLCHOLINE CHLORIDE 20 MG/ML IJ SOLN
INTRAMUSCULAR | Status: AC
  Filled 2011-12-27: qty 10

## 2011-12-27 MED ORDER — DIPHENHYDRAMINE HCL 50 MG/ML IJ SOLN: 12.5000 mg | INTRAMUSCULAR | Status: DC | PRN

## 2011-12-27 MED ORDER — KETOROLAC TROMETHAMINE 30 MG/ML IJ SOLN
INTRAMUSCULAR | Status: AC
  Administered 2011-12-27: 30 mg via INTRAVENOUS
  Filled 2011-12-27: qty 1

## 2011-12-27 MED ORDER — IBUPROFEN 600 MG PO TABS
600.0000 mg | ORAL_TABLET | Freq: Four times a day (QID) | ORAL | Status: DC
  Administered 2011-12-28 – 2011-12-30 (×6): 600 mg via ORAL
  Filled 2011-12-27: qty 1

## 2011-12-27 MED ORDER — TETANUS-DIPHTH-ACELL PERTUSSIS 5-2.5-18.5 LF-MCG/0.5 IM SUSP
0.5000 mL | Freq: Once | INTRAMUSCULAR | Status: AC
  Administered 2011-12-28: 0.5 mL via INTRAMUSCULAR
  Filled 2011-12-27: qty 0.5

## 2011-12-27 MED ORDER — MIDAZOLAM HCL 5 MG/ML IJ SOLN
INTRAMUSCULAR | Status: DC | PRN
  Administered 2011-12-27: 2 mg via INTRAVENOUS

## 2011-12-27 MED ORDER — OXYCODONE-ACETAMINOPHEN 5-325 MG PO TABS
1.0000 | ORAL_TABLET | ORAL | Status: DC | PRN
  Administered 2011-12-28 – 2011-12-30 (×8): 2 via ORAL
  Filled 2011-12-27 (×9): qty 2

## 2011-12-27 MED ORDER — SODIUM CHLORIDE 0.9 % IR SOLN
Status: DC | PRN
  Administered 2011-12-27: 1000 mL

## 2011-12-27 MED ORDER — PRENATAL MULTIVITAMIN CH
1.0000 | ORAL_TABLET | Freq: Every day | ORAL | Status: DC
  Administered 2011-12-28 – 2011-12-29 (×2): 1 via ORAL
  Filled 2011-12-27 (×2): qty 1

## 2011-12-27 MED ORDER — FENTANYL CITRATE 0.05 MG/ML IJ SOLN
INTRAMUSCULAR | Status: AC
  Filled 2011-12-27: qty 2

## 2011-12-27 MED ORDER — HYDROCOD POLST-CHLORPHEN POLST 10-8 MG/5ML PO LQCR
5.0000 mL | Freq: Two times a day (BID) | ORAL | Status: DC
  Filled 2011-12-27: qty 5

## 2011-12-27 MED ORDER — ACETAMINOPHEN 500 MG PO TABS: 500.0000 mg | ORAL_TABLET | Freq: Four times a day (QID) | ORAL | Status: DC | PRN

## 2011-12-27 MED ORDER — PHENYLEPHRINE HCL 10 MG/ML IJ SOLN
INTRAMUSCULAR | Status: DC | PRN
  Administered 2011-12-27 (×3): 120 ug via INTRAVENOUS
  Administered 2011-12-27: 80 ug via INTRAVENOUS

## 2011-12-27 MED ORDER — IBUPROFEN 600 MG PO TABS
600.0000 mg | ORAL_TABLET | Freq: Four times a day (QID) | ORAL | Status: DC | PRN
  Administered 2011-12-29: 600 mg via ORAL
  Filled 2011-12-27 (×8): qty 1

## 2011-12-27 MED ORDER — SENNOSIDES-DOCUSATE SODIUM 8.6-50 MG PO TABS
2.0000 | ORAL_TABLET | Freq: Every day | ORAL | Status: DC
  Administered 2011-12-28 – 2011-12-29 (×2): 2 via ORAL

## 2011-12-27 MED ORDER — SODIUM CHLORIDE 0.9 % IJ SOLN: 3.0000 mL | INTRAMUSCULAR | Status: DC | PRN

## 2011-12-27 MED ORDER — SCOPOLAMINE 1 MG/3DAYS TD PT72
MEDICATED_PATCH | TRANSDERMAL | Status: AC
  Administered 2011-12-27: 1.5 mg via TRANSDERMAL
  Filled 2011-12-27: qty 1

## 2011-12-27 MED ORDER — PHENYLEPHRINE 40 MCG/ML (10ML) SYRINGE FOR IV PUSH (FOR BLOOD PRESSURE SUPPORT)
PREFILLED_SYRINGE | INTRAVENOUS | Status: AC
  Filled 2011-12-27: qty 5

## 2011-12-27 MED ORDER — DIPHENHYDRAMINE HCL 50 MG/ML IJ SOLN: 25.0000 mg | INTRAMUSCULAR | Status: DC | PRN

## 2011-12-27 MED ORDER — AMLODIPINE BESYLATE 10 MG PO TABS
10.0000 mg | ORAL_TABLET | Freq: Every day | ORAL | Status: DC
  Administered 2011-12-28 – 2011-12-29 (×3): 10 mg via ORAL
  Filled 2011-12-27 (×4): qty 1

## 2011-12-27 MED ORDER — METHYLERGONOVINE MALEATE 0.2 MG PO TABS: 0.2000 mg | ORAL_TABLET | ORAL | Status: DC | PRN

## 2011-12-27 MED ORDER — DIPHENHYDRAMINE HCL 25 MG PO CAPS
25.0000 mg | ORAL_CAPSULE | ORAL | Status: DC | PRN
  Administered 2011-12-28 (×2): 25 mg via ORAL
  Filled 2011-12-27 (×2): qty 1

## 2011-12-27 MED ORDER — NALOXONE HCL 0.4 MG/ML IJ SOLN: 0.4000 mg | INTRAMUSCULAR | Status: DC | PRN

## 2011-12-27 MED ORDER — KETOROLAC TROMETHAMINE 30 MG/ML IJ SOLN
30.0000 mg | Freq: Four times a day (QID) | INTRAMUSCULAR | Status: AC | PRN
  Administered 2011-12-27 – 2011-12-28 (×2): 30 mg via INTRAVENOUS
  Filled 2011-12-27: qty 1

## 2011-12-27 MED ORDER — SODIUM CHLORIDE 0.9 % IV SOLN
1.0000 ug/kg/h | INTRAVENOUS | Status: DC | PRN
  Filled 2011-12-27: qty 2.5

## 2011-12-27 MED ORDER — OXYTOCIN 20 UNITS IN LACTATED RINGERS INFUSION - SIMPLE
125.0000 mL/h | INTRAVENOUS | Status: AC
  Filled 2011-12-27: qty 1000

## 2011-12-27 MED ORDER — CITRIC ACID-SODIUM CITRATE 334-500 MG/5ML PO SOLN
ORAL | Status: AC
  Administered 2011-12-27: 30 mL
  Filled 2011-12-27: qty 15

## 2011-12-27 MED ORDER — MEPERIDINE HCL 25 MG/ML IJ SOLN: 6.2500 mg | INTRAMUSCULAR | Status: DC | PRN

## 2011-12-27 MED ORDER — DIBUCAINE 1 % RE OINT: 1.0000 "application " | TOPICAL_OINTMENT | RECTAL | Status: DC | PRN

## 2011-12-27 MED ORDER — MIDAZOLAM HCL 2 MG/2ML IJ SOLN
INTRAMUSCULAR | Status: AC
  Filled 2011-12-27: qty 2

## 2011-12-27 MED ORDER — CITRIC ACID-SODIUM CITRATE 334-500 MG/5ML PO SOLN: 30.0000 mL | Freq: Once | ORAL | Status: DC

## 2011-12-27 MED ORDER — FENTANYL CITRATE 0.05 MG/ML IJ SOLN
INTRAMUSCULAR | Status: DC | PRN
  Administered 2011-12-27: 25 ug via INTRATHECAL

## 2011-12-27 MED ORDER — ROCURONIUM BROMIDE 50 MG/5ML IV SOLN
INTRAVENOUS | Status: AC
  Filled 2011-12-27: qty 1

## 2011-12-27 MED ORDER — ONDANSETRON HCL 4 MG/2ML IJ SOLN: 4.0000 mg | INTRAMUSCULAR | Status: DC | PRN

## 2011-12-27 MED ORDER — FENTANYL CITRATE 0.05 MG/ML IJ SOLN
INTRAMUSCULAR | Status: DC | PRN
  Administered 2011-12-27: 75 ug via INTRAVENOUS

## 2011-12-27 MED ORDER — FENTANYL CITRATE 0.05 MG/ML IJ SOLN
INTRAMUSCULAR | Status: AC
  Filled 2011-12-27: qty 10

## 2011-12-27 MED ORDER — MEDROXYPROGESTERONE ACETATE 150 MG/ML IM SUSP: 150.0000 mg | INTRAMUSCULAR | Status: DC | PRN

## 2011-12-27 MED ORDER — DIPHENHYDRAMINE HCL 25 MG PO CAPS: 25.0000 mg | ORAL_CAPSULE | Freq: Four times a day (QID) | ORAL | Status: DC | PRN

## 2011-12-27 MED ORDER — CLINDAMYCIN PHOSPHATE 600 MG/50ML IV SOLN
INTRAVENOUS | Status: DC | PRN
  Administered 2011-12-27: 900 mg via INTRAVENOUS

## 2011-12-27 MED ORDER — LANOLIN HYDROUS EX OINT: 1.0000 "application " | TOPICAL_OINTMENT | CUTANEOUS | Status: DC | PRN

## 2011-12-27 MED ORDER — HYDROMORPHONE HCL PF 1 MG/ML IJ SOLN
0.5000 mg | INTRAMUSCULAR | Status: AC
  Administered 2011-12-27 (×2): 0.5 mg via INTRAVENOUS
  Filled 2011-12-27: qty 1

## 2011-12-27 MED ORDER — BUPIVACAINE IN DEXTROSE 0.75-8.25 % IT SOLN
INTRATHECAL | Status: DC | PRN
  Administered 2011-12-27: 1.5 mL via INTRATHECAL

## 2011-12-27 MED ORDER — CLINDAMYCIN PHOSPHATE 900 MG/50ML IV SOLN
900.0000 mg | INTRAVENOUS | Status: AC
  Filled 2011-12-27: qty 50

## 2011-12-27 MED ORDER — WITCH HAZEL-GLYCERIN EX PADS: 1.0000 "application " | MEDICATED_PAD | CUTANEOUS | Status: DC | PRN

## 2011-12-27 MED ORDER — LACTATED RINGERS IV SOLN
INTRAVENOUS | Status: DC
  Administered 2011-12-27: 125 mL/h via INTRAVENOUS

## 2011-12-27 MED ORDER — PRENATAL PLUS 27-1 MG PO TABS: 1.0000 | ORAL_TABLET | Freq: Every day | ORAL | Status: DC

## 2011-12-27 MED ORDER — ONDANSETRON HCL 4 MG PO TABS: 4.0000 mg | ORAL_TABLET | ORAL | Status: DC | PRN

## 2011-12-27 MED ORDER — SCOPOLAMINE 1 MG/3DAYS TD PT72
1.0000 | MEDICATED_PATCH | Freq: Once | TRANSDERMAL | Status: DC
  Administered 2011-12-27: 1.5 mg via TRANSDERMAL

## 2011-12-27 MED ORDER — SIMETHICONE 80 MG PO CHEW: 80.0000 mg | CHEWABLE_TABLET | ORAL | Status: DC | PRN

## 2011-12-27 MED ORDER — ONDANSETRON HCL 4 MG/2ML IJ SOLN
INTRAMUSCULAR | Status: DC | PRN
  Administered 2011-12-27: 4 mg via INTRAVENOUS

## 2011-12-27 MED ORDER — ZOLPIDEM TARTRATE 5 MG PO TABS: 5.0000 mg | ORAL_TABLET | Freq: Every evening | ORAL | Status: DC | PRN

## 2011-12-27 MED ORDER — OXYTOCIN 10 UNIT/ML IJ SOLN
INTRAMUSCULAR | Status: AC
  Filled 2011-12-27: qty 4

## 2011-12-27 SURGICAL SUPPLY — 42 items
ADH SKN CLS APL DERMABOND .7 (GAUZE/BANDAGES/DRESSINGS)
CANISTER WOUND CARE 500ML ATS (WOUND CARE) IMPLANT
CHLORAPREP W/TINT 26ML (MISCELLANEOUS) ×1 IMPLANT
CLOTH BEACON ORANGE TIMEOUT ST (SAFETY) ×2 IMPLANT
CONTAINER PREFILL 10% NBF 15ML (MISCELLANEOUS) ×2 IMPLANT
DERMABOND ADVANCED (GAUZE/BANDAGES/DRESSINGS)
DERMABOND ADVANCED .7 DNX12 (GAUZE/BANDAGES/DRESSINGS) ×1 IMPLANT
DRSG COVADERM 4X10 (GAUZE/BANDAGES/DRESSINGS) ×1 IMPLANT
DRSG PAD ABDOMINAL 8X10 ST (GAUZE/BANDAGES/DRESSINGS) ×1 IMPLANT
DRSG VAC ATS LRG SENSATRAC (GAUZE/BANDAGES/DRESSINGS) IMPLANT
DRSG VAC ATS MED SENSATRAC (GAUZE/BANDAGES/DRESSINGS) IMPLANT
DRSG VAC ATS SM SENSATRAC (GAUZE/BANDAGES/DRESSINGS) IMPLANT
ELECT REM PT RETURN 9FT ADLT (ELECTROSURGICAL) ×2
ELECTRODE REM PT RTRN 9FT ADLT (ELECTROSURGICAL) ×1 IMPLANT
EXTRACTOR VACUUM M CUP 4 TUBE (SUCTIONS) IMPLANT
GLOVE BIO SURGEON STRL SZ8 (GLOVE) ×5 IMPLANT
GOWN PREVENTION PLUS LG XLONG (DISPOSABLE) ×3 IMPLANT
GOWN PREVENTION PLUS XLARGE (GOWN DISPOSABLE) ×2 IMPLANT
KIT ABG SYR 3ML LUER SLIP (SYRINGE) ×1 IMPLANT
NDL HYPO 25X5/8 SAFETYGLIDE (NEEDLE) ×1 IMPLANT
NEEDLE HYPO 25X5/8 SAFETYGLIDE (NEEDLE) ×2 IMPLANT
NS IRRIG 1000ML POUR BTL (IV SOLUTION) ×2 IMPLANT
PACK C SECTION WH (CUSTOM PROCEDURE TRAY) ×2 IMPLANT
RTRCTR C-SECT PINK 25CM LRG (MISCELLANEOUS) IMPLANT
SLEEVE SCD COMPRESS KNEE MED (MISCELLANEOUS) ×1 IMPLANT
STAPLER VISISTAT 35W (STAPLE) ×1 IMPLANT
SUT GUT PLAIN 0 CT-3 TAN 27 (SUTURE) ×1 IMPLANT
SUT MNCRL 0 VIOLET CTX 36 (SUTURE) ×3 IMPLANT
SUT MNCRL AB 4-0 PS2 18 (SUTURE) IMPLANT
SUT MON AB 2-0 CT1 27 (SUTURE) ×2 IMPLANT
SUT MON AB 3-0 SH 27 (SUTURE)
SUT MON AB 3-0 SH27 (SUTURE) IMPLANT
SUT MONOCRYL 0 CTX 36 (SUTURE) ×3
SUT PLAIN 2 0 XLH (SUTURE) IMPLANT
SUT VIC AB 0 CTX 36 (SUTURE) ×4
SUT VIC AB 0 CTX36XBRD ANBCTRL (SUTURE) ×2 IMPLANT
SUT VIC AB 2-0 CT1 27 (SUTURE)
SUT VIC AB 2-0 CT1 TAPERPNT 27 (SUTURE) IMPLANT
TAPE CLOTH SURG 4X10 WHT LF (GAUZE/BANDAGES/DRESSINGS) ×1 IMPLANT
TOWEL OR 17X24 6PK STRL BLUE (TOWEL DISPOSABLE) ×4 IMPLANT
TRAY FOLEY CATH 14FR (SET/KITS/TRAYS/PACK) ×2 IMPLANT
WATER STERILE IRR 1000ML POUR (IV SOLUTION) ×2 IMPLANT

## 2011-12-27 NOTE — Progress Notes (Signed)
Notified of FHR base line 110-115 with late decel. Requested to come to MAU to review FHR.

## 2011-12-27 NOTE — Addendum Note (Signed)
Addendum  created 12/27/11 2200 by Pati Thinnes L. Rodman Pickle, MD   Modules edited:Orders

## 2011-12-27 NOTE — Progress Notes (Addendum)
FHR 80-90b/m post spinal. Notified NICU and Dr. Clearance Coots. Pt preped for C-section.

## 2011-12-27 NOTE — Anesthesia Procedure Notes (Signed)
Spinal  Patient location during procedure: OR Start time: 12/27/2011 4:58 PM Staffing Anesthesiologist: Strehl, Timm Bonenberger A. Performed by: anesthesiologist  Preanesthetic Checklist Completed: patient identified, site marked, surgical consent, pre-op evaluation, timeout performed, IV checked, risks and benefits discussed and monitors and equipment checked Spinal Block Patient position: sitting Prep: site prepped and draped and DuraPrep Patient monitoring: heart rate, cardiac monitor, continuous pulse ox and blood pressure Approach: midline Location: L3-4 Injection technique: single-shot Needle Needle type: Sprotte  Needle gauge: 24 G Needle length: 9 cm Needle insertion depth: 7 cm Assessment Sensory level: T4 Additional Notes Patient tolerated procedure well. Adequate sensory level.

## 2011-12-27 NOTE — OR Nursing (Signed)
Uterus massaged by S. Aleric Froelich RN.  Two tubes of cord blood to lab.  

## 2011-12-27 NOTE — H&P (Signed)
Meredith York is a 36 y.o. female presenting for decreased fetal movement. Maternal Medical History:  Fetal activity: Perceived fetal activity is decreased.   Last perceived fetal movement was within the past 24 hours.    Prenatal Complications - Diabetes: gestational. Diabetes is managed by diet.      OB History    Grav Para Term Preterm Abortions TAB SAB Ect Mult Living   4 2 2  1 1    2      Past Medical History  Diagnosis Date  . Hypertension   . Diabetes in pregnancy   . Migraines   . Ovarian cyst   . Chlamydia   . Trichomonas    Past Surgical History  Procedure Date  . Cholecystectomy 02/2009   Family History: family history includes Hypertension in her maternal aunt and paternal aunt.  There is no history of Anesthesia problems, and Hypotension, and Malignant hyperthermia, and Pseudochol deficiency, . Social History:  reports that she quit smoking about 7 months ago. Her smoking use included Cigarettes. She smoked 0 packs per day for 16 years. She has never used smokeless tobacco. She reports that she drinks about 1.2 ounces of alcohol per week. She reports that she uses illicit drugs (Marijuana).  Review of Systems  All other systems reviewed and are negative.      Blood pressure 142/77, pulse 105, temperature 98.5 F (36.9 C), resp. rate 18, height 5' 2.5" (1.588 m), weight 107.321 kg (236 lb 9.6 oz), last menstrual period 04/01/2011. Maternal Exam:  Uterine Assessment: Contraction strength is mild.  Contraction duration is 60 seconds. Contraction frequency is irregular.   Abdomen: Patient reports no abdominal tenderness. Fetal presentation: vertex  Introitus: Normal vulva. Normal vagina.  Ferning test: not done.  Nitrazine test: not done. Amniotic fluid character: not assessed.  Pelvis: adequate for delivery.   Cervix: Cervix evaluated by digital exam.     Physical Exam  Nursing note and vitals reviewed. Constitutional: She is oriented to person, place,  and time. She appears well-developed and well-nourished.  HENT:  Head: Normocephalic and atraumatic.  Eyes: Conjunctivae are normal. Pupils are equal, round, and reactive to light.  Neck: Normal range of motion. Neck supple.  Cardiovascular: Normal rate and regular rhythm.   Respiratory: Effort normal and breath sounds normal.  GI: Soft.  Genitourinary: Vagina normal and uterus normal.  Musculoskeletal: Normal range of motion.  Neurological: She is alert and oriented to person, place, and time. She has normal reflexes.  Skin: Skin is warm and dry.  Psychiatric: She has a normal mood and affect. Her behavior is normal. Judgment and thought content normal.    Prenatal labs: ABO, Rh: --/--/O POS (05/20 1537) Antibody:   Rubella: Immune (05/20 0000) RPR: Nonreactive (05/20 0000)  HBsAg: Negative (05/20 0000)  HIV: Non-reactive (05/20 0000)  GBS:     Assessment/Plan:38 weeks.  Gestational Diabetes.  Decreased fetal movement.  Non reactive NST.  Will proceed with C/S delivery for nonreassuring fetal tracing. HARPER,CHARLES A 12/27/2011, 4:32 PM

## 2011-12-27 NOTE — ED Notes (Signed)
FHR 105 pre spinal

## 2011-12-27 NOTE — ED Notes (Signed)
U/S at bedside . Dr Clearance Coots discussing findings with pt. C-section called. Pt signed consent and preped for surgery.

## 2011-12-27 NOTE — Progress Notes (Addendum)
Pt off monitor to go to OR

## 2011-12-27 NOTE — Transfer of Care (Signed)
Immediate Anesthesia Transfer of Care Note  Patient: Meredith York  Procedure(s) Performed:  CESAREAN SECTION - Primary cesarean section with delivery of baby girl at 45.  Patient Location: PACU  Anesthesia Type: Spinal  Level of Consciousness: awake, alert  and oriented  Airway & Oxygen Therapy: Patient Spontanous Breathing  Post-op Assessment: Report given to PACU RN and Post -op Vital signs reviewed and stable  Post vital signs: stable  Complications: No apparent anesthesia complications

## 2011-12-27 NOTE — Anesthesia Preprocedure Evaluation (Addendum)
Anesthesia Evaluation  Patient identified by MRN, date of birth, ID band Patient awake    Reviewed: Allergy & Precautions, H&P , NPO status , Patient's Chart, lab work & pertinent test results  Airway Mallampati: III TM Distance: >3 FB Neck ROM: Full    Dental No notable dental hx. (+) Teeth Intact   Pulmonary neg pulmonary ROS,  clear to auscultation  Pulmonary exam normal       Cardiovascular hypertension, Pt. on medications Regular Normal    Neuro/Psych  Headaches, Negative Psych ROS   GI/Hepatic negative GI ROS, Neg liver ROS,   Endo/Other  Diabetes mellitus-, Well Controlled, Gestational  Renal/GU negative Renal ROS  Genitourinary negative   Musculoskeletal   Abdominal Normal abdominal exam  (+)   Peds  Hematology negative hematology ROS (+)   Anesthesia Other Findings   Reproductive/Obstetrics (+) Pregnancy                         Anesthesia Physical Anesthesia Plan  ASA: III and Emergent  Anesthesia Plan: Spinal   Post-op Pain Management:    Induction:   Airway Management Planned:   Additional Equipment:   Intra-op Plan:   Post-operative Plan:   Informed Consent: I have reviewed the patients History and Physical, chart, labs and discussed the procedure including the risks, benefits and alternatives for the proposed anesthesia with the patient or authorized representative who has indicated his/her understanding and acceptance.   Dental Advisory Given  Plan Discussed with: Anesthesiologist, Surgeon and CRNA  Anesthesia Plan Comments:         Anesthesia Quick Evaluation

## 2011-12-27 NOTE — Progress Notes (Signed)
Was seen in MAU for same symptoms, contractions, decreased fetal movement, lower back pain.

## 2011-12-27 NOTE — Anesthesia Postprocedure Evaluation (Signed)
Anesthesia Post Note  Patient: Meredith York  Procedure(s) Performed:  CESAREAN SECTION - Primary cesarean section with delivery of baby girl at 5. Apgars 3/3/6  Anesthesia type: Spinal  Patient location: PACU  Post pain: Pain level controlled  Post assessment: Post-op Vital signs reviewed  Last Vitals:  Filed Vitals:   12/27/11 2030  BP:   Pulse: 77  Temp:   Resp: 16    Post vital signs: Reviewed  Level of consciousness: awake  Complications: No apparent anesthesia complications

## 2011-12-28 ENCOUNTER — Ambulatory Visit (HOSPITAL_COMMUNITY): Payer: Medicaid Other

## 2011-12-28 ENCOUNTER — Encounter (HOSPITAL_COMMUNITY): Payer: Self-pay | Admitting: Obstetrics

## 2011-12-28 LAB — CBC
HCT: 27.7 % — ABNORMAL LOW (ref 36.0–46.0)
MCHC: 32.5 g/dL (ref 30.0–36.0)
MCV: 86 fL (ref 78.0–100.0)
RDW: 14 % (ref 11.5–15.5)

## 2011-12-28 NOTE — Anesthesia Postprocedure Evaluation (Signed)
  Anesthesia Post-op Note  Patient: Meredith York  Procedure(s) Performed:  CESAREAN SECTION - Primary cesarean section with delivery of baby girl at 28. Apgars 3/3/6  Patient Location: PACU and Women's Unit  Anesthesia Type: Spinal  Level of Consciousness: awake, alert  and oriented  Airway and Oxygen Therapy: Patient Spontanous Breathing  Post-op Pain: mild  Post-op Assessment: Post-op Vital signs reviewed  Post-op Vital Signs: Reviewed and stable  Complications: No apparent anesthesia complications

## 2011-12-28 NOTE — Progress Notes (Signed)
Patient ID: Meredith York, female   DOB: 07/03/1976, 36 y.o.   MRN: [id number] Postop day 1 Blood pressure is normal Output good Fundus firm Lochia moderate Legs negative Patient is still on her Norvasc 10 mg by mouth daily

## 2011-12-28 NOTE — Addendum Note (Signed)
Addendum  created 12/28/11 1115 by Salome Arnt, RN   Modules edited:Notes Section

## 2011-12-28 NOTE — Progress Notes (Signed)
UR chart review completed.  

## 2011-12-29 NOTE — Progress Notes (Signed)
PSYCHOSOCIAL ASSESSMENT ~ MATERNAL/CHILD Name: Meredith York                                                                                                         Age: 35 days   Referral Date: 12/29/11 Reason/Source: NICU Support  I. FAMILY/HOME ENVIRONMENT A. Child's Legal Guardian _x__Parent(s) ___Grandparent ___Foster parent ___DSS_________________ Name: Malyssa Rabideau                                     DOB: 05/[birth year]1977           Age: 35   Address: [address], [place], [place] [address]   Name: Andrew Topping                                    DOB: //                     Age:   Address: FOB does not live with MOB  B. Other Household Members/Support Persons Name: Ashlyn (14)                      Relationship: sister                   Name: Alana (4)                          Relationship: sister                   Name:                                         Relationship:                        DOB ___/___/___                   Name:                                         Relationship:                        DOB ___/___/___  C. Other Support: MOB reports good support from family and friends who live locally.   II. PSYCHOSOCIAL DATA A. Information Source                                                                                               _x_Patient Interview  __Family Interview           _x_Other: chart  B. Financial and Community Resources _x_Employment: FOB-[employer], MOB-S@[employer] _x_Medicaid    County: [place]                __Private Insurance:                   __Self Pay  __Food Stamps   __WIC __Work First     __Public Housing     __Section 8    __Maternity Care Coordination/Child Service Coordination/Early Intervention  __School:                                                                         Grade:  __Other:   C. Cultural and Environment Information Cultural Issues Impacting Care: none known  III. STRENGTHS _x__Supportive family/friends _x__Adequate  Resources _x__Compliance with medical plan _x__Home prepared for Child (including basic supplies) _x__Understanding of illness      _x__Other: Pediatrician will be Dr. Reid at [facility] IV. RISK FACTORS AND CURRENT PROBLEMS         __x__No Problems Noted                                                                                                                                                                                                                                       Pt              Family     Substance Abuse                                                                ___              ___        Mental Illness                                                                          ___              ___  Family/Relationship Issues                                      ___               ___             Abuse/Neglect/Domestic Violence                                         ___         ___  Financial Resources                                        ___              ___             Transportation                                                                        ___               ___  DSS Involvement                                                                   ___              ___  Adjustment to Illness                                                               ___              ___  Knowledge/Cognitive Deficit                                                   ___              ___             Compliance with Treatment                                                 ___                ___  Basic Needs (food, housing, etc.)                                          ___              ___             Housing Concerns                                       ___              ___ Other_____________________________________________________________            V. SOCIAL WORK ASSESSMENT SW met with MOB in her third floor room to introduce myself, complete assessment and evaluate how she is  coping with baby's illness and admission to NICU.  MOB was very pleasant and states that she has a good support system going through this situation with her.  She states that FOB is involved and supportive.  He works second shift at [employer].  She states that she stays at home.  SW got the impression that they may no longer be a couple, and MOB confirmed that they get along, but are not still together.  They are working together for the baby's sake.  She reports having most major supplies at home for baby.  SW explained baby's possible eligibility for SSI and MOB informed SW that her 35 year old receives SSI.  She states that she has bilateral club feet, developmental delays and had Hirschsprung's Disease.  SW assisted MOB in completing SSI application and will submit paperwork once birth certificate has been completed and a copy has been obtained.  SW inquired about which pediatrician MOB takes her daughters to and she informed SW that her 35 year old goes to Dr. Henderson at [facility] and her 4 year old goes to Dr. Reid at [facility].  She plans to take this baby to Dr. Reid also.  SW is not sure that MOB fully understands the severity of the situation with baby's medical condition, but suspects she may be handling it so well since she has had experience with a child with special needs.  SW offered to arrange conferences with the medical staff at any time and explained support services offered by NICU SWs.  SW gave contact information.  MOB thanked SW for visiting and seemed appreciative of the support.  VI. SOCIAL WORK PLAN  ___No Further Intervention Required/No Barriers to Discharge   _x__Psychosocial Support and Ongoing Assessment of Needs   ___Patient/Family Education:   ___Child Protective Services Report   County___________ Date___/____/____   ___Information/Referral to Community Resources_________________________   _x__Other: SSI 

## 2011-12-29 NOTE — Progress Notes (Signed)
Patient ID: Meredith York, female   DOB: [birth year], 36 y.o.   MRN: [id number] Postop day 2 Vital signs normal Fundus firm Incision clean and dry Legs negative No complaints and

## 2011-12-30 MED ORDER — AMLODIPINE BESYLATE 10 MG PO TABS: 10.0000 mg | ORAL_TABLET | Freq: Every day | ORAL | Status: DC

## 2011-12-30 NOTE — Discharge Summary (Signed)
Obstetric Discharge Summary Reason for Admission: cesarean section Prenatal Procedures: NST Intrapartum Procedures: cesarean: low cervical, transverse Postpartum Procedures: none Complications-Operative and Postpartum: none Hemoglobin  Date Value Range Status  12/28/2011 9.0* 12.0-15.0 (g/dL) Final     HCT  Date Value Range Status  12/28/2011 27.7* 36.0-46.0 (%) Final    Discharge Diagnoses: Term Pregnancy-delivered  Discharge Information: Date: 12/30/2011 Activity: pelvic rest Diet: routine Medications: Percocet Condition: stable Instructions: refer to practice specific booklet Discharge to: home Follow-up Information    Follow up with Sukanya Goldblatt A, MD. Call in 1 week.   Contact information:   [address] [place] [address] [phone]          Newborn Data: Live born female  Birth Weight: 8 lb 2.4 oz (3696 g) APGAR: 3, 3 Remains in nich .  Cerrone Debold A 12/30/2011, 6:22 AM

## 2011-12-30 NOTE — Progress Notes (Signed)
Discharge instructions reviewed with patient. Comfort measures given due to NICU baby poor status. Incision site care provided. Staples were removed steri strips applied. Site approximated. Pt and family members ambulated to NICU to watch airlift of baby. Pt states no complaints.

## 2011-12-30 NOTE — Progress Notes (Signed)
Patient ID: Meredith York, female   DOB: 07-09-1976, 36 y.o.   MRN: [id number] Postop day 3 Vital signs normal Incision clean and dry Lochia moderate Legs negative No complaints Patient with a discharge on Norvasc 10 mg by mouth every   day and Percocet for pain to see me in one week for blood pressure check

## 2011-12-31 ENCOUNTER — Ambulatory Visit (HOSPITAL_COMMUNITY): Payer: Medicaid Other

## 2011-12-31 ENCOUNTER — Ambulatory Visit (HOSPITAL_COMMUNITY): Admission: RE | Admit: 2011-12-31 | Payer: Medicaid Other | Source: Ambulatory Visit

## 2012-04-06 ENCOUNTER — Encounter (HOSPITAL_COMMUNITY): Payer: Self-pay

## 2012-04-06 ENCOUNTER — Emergency Department (HOSPITAL_COMMUNITY)
Admission: EM | Admit: 2012-04-06 | Discharge: 2012-04-06 | Disposition: A | Discharge status: Home / Self Care | Payer: Medicaid Other | Source: Home / Self Care | Attending: Emergency Medicine | Admitting: Emergency Medicine

## 2012-04-06 DIAGNOSIS — G5600 Carpal tunnel syndrome, unspecified upper limb: Secondary | ICD-10-CM

## 2012-04-06 LAB — POCT I-STAT, CHEM 8
Chloride: 109 mEq/L (ref 96–112)
HCT: 42 % (ref 36.0–46.0)
Hemoglobin: 14.3 g/dL (ref 12.0–15.0)
Potassium: 3.5 mEq/L (ref 3.5–5.1)
Sodium: 143 mEq/L (ref 135–145)

## 2012-04-06 MED ORDER — PREDNISONE 10 MG PO TABS: ORAL_TABLET | ORAL | Status: DC

## 2012-04-06 NOTE — ED Provider Notes (Signed)
Chief Complaint  Patient presents with  . Wrist Pain    History of Present Illness:   The patient is a 36 year old female who has had a five-day history of pain in the volar right wrist with radiation into the hand and the fingers, mostly the thumb index middle and ring finger but not into the pinky finger. She describes a burning, numbness, and tingling. Sometimes this wakes her up at nighttime and she has to shake her head. She's had a history of tendinitis in the wrist before. She denies any history of carpal tunnel syndrome. The wrists and hands seem somewhat swollen. It hurts her to move her fingers. She denies any neck pain or arm pain.  Review of Systems:  Other than noted above, the patient denies any of the following symptoms: Systemic:  No fevers, chills, sweats, or aches.  No fatigue or tiredness. Musculoskeletal:  No joint pain, arthritis, bursitis, swelling, back pain, or neck pain. Neurological:  No muscular weakness, paresthesias, headache, or trouble with speech or coordination.  No dizziness.   PMFSH:  Past medical history, family history, social history, meds, and allergies were reviewed.  Physical Exam:   Vital signs:  BP 133/75  Pulse 84  Temp(Src) 97.7 F (36.5 C) (Oral)  Resp 20  SpO2 100%  LMP 03/31/2012 Gen:  Alert and oriented times 3.  In no distress. Musculoskeletal: There was pain to palpation over the carpal tunnel and she had a positive Tinel's sign. She also had positive Phalen's sign and reverse Phalen sign. Otherwise, all joints had a full a ROM with no swelling, bruising or deformity.  No edema, pulses full. Extremities were warm and pink.  Capillary refill was brisk.  Skin:  Clear, warm and dry.  No rash. Neuro:  Alert and oriented times 3.  Muscle strength was normal.  Sensation was intact to light touch.   Course in Urgent Care Center:   She was put in a wrist splint.   Assessment:  The encounter diagnosis was Carpal tunnel syndrome.  Plan:   1.   The following meds were prescribed:   New Prescriptions   PREDNISONE (DELTASONE) 10 MG TABLET    2 daily for 15 day, then 1 daily for 15 days.   2.  The patient was instructed in symptomatic care, including rest and activity, elevation, application of ice and compression.  Appropriate handouts were given. 3.  The patient was told to return if becoming worse in any way, if no better in 3 or 4 days, and given some red flag symptoms that would indicate earlier return.   4.  The patient was told to follow up with Dr. Izora Ribas if no better in 2 weeks.   Reuben Likes, MD 04/06/12 [phone]

## 2012-04-06 NOTE — ED Notes (Signed)
Wrist pain , no known trauma, denies prolonged computer keyboard use; pain in wrist sometimes wakes her at night

## 2012-04-06 NOTE — Discharge Instructions (Signed)
Carpal Tunnel Syndrome The carpal tunnel is a narrow hollow area in the wrist. It is formed by the wrist bones and ligaments. Nerves, blood vessels, and tendons (cord like structures which attach muscle to bone) on the palm side (the side of your hand in the direction your fingers bend) of your hand pass through the carpal tunnel. Repeated wrist motion or certain diseases may cause swelling within the tunnel. (That is why these are called repetitive trauma (damage caused by over use) disorders. It is also a common problem in late pregnancy.) This swelling pinches the main nerve in the wrist (median nerve) and causes the painful condition called carpal tunnel syndrome. A feeling of "pins and needles" may be noticed in the fingers or hand; however, the entire arm may ache from this condition. Carpal tunnel syndrome may clear up by itself. Cortisone injections may help. Sometimes, an operation may be needed to free the pinched nerve. An electromyogram (a type of test) may be needed to confirm this diagnosis (learning what is wrong). This is a test which measures nerve conduction. The nerve conduction is usually slowed in a carpal tunnel syndrome. HOME CARE INSTRUCTIONS   If your caregiver prescribed medication to help reduce swelling, take as directed.   If you were given a splint to keep your wrist from bending, use it as instructed. It is important to wear the splint at night. Use the splint for as long as you have pain or numbness in your hand, arm or wrist. This may take 1 to 2 months.   If you have pain at night, it may help to rub or shake your hand, or elevate your hand above the level of your heart (the center of your chest).   It is important to give your wrist a rest by stopping the activities that are causing the problem. If your symptoms (problems) are work-related, you may need to talk to your employer about changing to a job that does not require using your wrist.   Only take over-the-counter  or prescription medicines for pain, discomfort, or fever as directed by your caregiver.   Following periods of extended use, particularly strenuous use, apply an ice pack wrapped in a towel to the anterior (palm) side of the affected wrist for 20 to 30 minutes. Repeat as needed three to four times per day. This will help reduce the swelling.   Follow all instructions for follow-up with your caregiver. This includes any orthopedic referrals, physical therapy, and rehabilitation. Any delay in obtaining necessary care could result in a delay or failure of your condition to heal.  SEEK IMMEDIATE MEDICAL CARE IF:   You are still having pain and numbness following a week of treatment.   You develop new, unexplained symptoms.   Your current symptoms are getting worse and are not helped or controlled with medications.  MAKE SURE YOU:   Understand these instructions.   Will watch your condition.   Will get help right away if you are not doing well or get worse.  Document Released: 12/04/2000 Document Revised: 11/26/2011 Document Reviewed: 10/23/2011 [employer] Patient Information 2012 [employer]. 

## 2012-06-19 ENCOUNTER — Emergency Department (HOSPITAL_COMMUNITY)
Admission: EM | Admit: 2012-06-19 | Discharge: 2012-06-19 | Disposition: A | Discharge status: Home / Self Care | Payer: Medicaid Other | Attending: Emergency Medicine | Admitting: Emergency Medicine

## 2012-06-19 DIAGNOSIS — Z881 Allergy status to other antibiotic agents status: Secondary | ICD-10-CM | POA: Insufficient documentation

## 2012-06-19 DIAGNOSIS — N83209 Unspecified ovarian cyst, unspecified side: Secondary | ICD-10-CM | POA: Insufficient documentation

## 2012-06-19 DIAGNOSIS — G43909 Migraine, unspecified, not intractable, without status migrainosus: Secondary | ICD-10-CM | POA: Insufficient documentation

## 2012-06-19 DIAGNOSIS — M545 Low back pain, unspecified: Secondary | ICD-10-CM | POA: Insufficient documentation

## 2012-06-19 DIAGNOSIS — Z87891 Personal history of nicotine dependence: Secondary | ICD-10-CM | POA: Insufficient documentation

## 2012-06-19 DIAGNOSIS — Z8249 Family history of ischemic heart disease and other diseases of the circulatory system: Secondary | ICD-10-CM | POA: Insufficient documentation

## 2012-06-19 DIAGNOSIS — I10 Essential (primary) hypertension: Secondary | ICD-10-CM | POA: Insufficient documentation

## 2012-06-19 MED ORDER — DIAZEPAM 5 MG PO TABS: ORAL_TABLET | ORAL | Status: AC

## 2012-06-19 MED ORDER — IBUPROFEN 800 MG PO TABS
800.0000 mg | ORAL_TABLET | Freq: Once | ORAL | Status: AC
  Administered 2012-06-19: 800 mg via ORAL
  Filled 2012-06-19: qty 1

## 2012-06-19 MED ORDER — NAPROXEN 500 MG PO TABS: 500.0000 mg | ORAL_TABLET | Freq: Two times a day (BID) | ORAL | Status: DC

## 2012-06-19 NOTE — ED Provider Notes (Signed)
History     CSN: [id number]  Arrival date & time 06/19/12  0907   First MD Initiated Contact with Patient 06/19/12 [phone]      Chief Complaint  Patient presents with  . Back Pain    (Consider location/radiation/quality/duration/timing/severity/associated sxs/prior treatment) Patient is a 36 y.o. female presenting with back pain. The history is provided by the patient.  Back Pain    patient presents to emergency department complaining of acute onset lower back pain x4 days. Patient states that she woke 4 days ago with some aching in her lower back. Patient states that since then the pain is progressively worsened. Patient states pain is aggravated by movement such as bending from her waist and twisting. Patient states she's taken over-the-counter Tylenol with only mild relief of symptoms. Patient denies known injury to her lower back. Patient denies any fevers, chills, abdominal pain, nausea, vomiting, diarrhea, dysuria, hematuria, blood in her stool, increased urinary frequency, radiation of pain into her lower extremities. lower extremity numbness/timing/weakness, loss of bowel or bladder function, or saddle seat paresthesias. Patient denies hx of similar pain.   Past Medical History  Diagnosis Date  . Hypertension   . Diabetes in pregnancy   . Migraines   . Ovarian cyst   . Chlamydia   . Trichomonas     Past Surgical History  Procedure Date  . Cholecystectomy 02/2009  . Cesarean section 12/27/2011    Procedure: CESAREAN SECTION;  Surgeon: Brock Bad, MD;  Location: [facility];  Service: Gynecology;  Laterality: N/A;  Primary cesarean section with delivery of baby girl at 19. Apgars 3/3/6    Family History  Problem Relation Age of Onset  . Hypertension Maternal Aunt   . Hypertension Paternal Aunt   . Anesthesia problems Neg Hx   . Hypotension Neg Hx   . Malignant hyperthermia Neg Hx   . Pseudochol deficiency Neg Hx     History  Substance Use Topics  . Smoking status:  Former Smoker -- 0.0 packs/day for 16 years    Types: Cigarettes    Quit date: 05/12/2011  . Smokeless tobacco: Never Used  . Alcohol Use: 1.2 oz/week    1 Glasses of wine, 1 Shots of liquor per week     Occasional, Quit with (+) UPT    OB History    Grav Para Term Preterm Abortions TAB SAB Ect Mult Living   4 3 3  1 1    2       Review of Systems  Musculoskeletal: Positive for back pain.  All other systems reviewed and are negative.    Allergies  Amoxicillin  Home Medications   Current Outpatient Rx  Name Route Sig Dispense Refill  . ACETAMINOPHEN 500 MG PO TABS Oral Take 1,000 mg by mouth every 6 (six) hours as needed. For pain    . AMLODIPINE BESYLATE 10 MG PO TABS Oral Take 10 mg by mouth daily.    . SERTRALINE HCL 100 MG PO TABS Oral Take 100 mg by mouth daily.    Marland Kitchen DIAZEPAM 5 MG PO TABS  Take 1 tablet by mouth every 4-6 hours as needed for muscle relaxation 15 tablet 0  . NAPROXEN 500 MG PO TABS Oral Take 1 tablet (500 mg total) by mouth 2 (two) times daily. 30 tablet 0    BP 124/85  Pulse 93  Temp 98.2 F (36.8 C) (Oral)  SpO2 100%  LMP 05/26/2012  Physical Exam  Nursing note and vitals reviewed. Constitutional:  She is oriented to person, place, and time. She appears well-developed and well-nourished. No distress.  HENT:  Head: Normocephalic and atraumatic.  Eyes: Conjunctivae are normal.  Neck: Normal range of motion. Neck supple.  Cardiovascular: Normal rate, regular rhythm, normal heart sounds and intact distal pulses.  Exam reveals no gallop and no friction rub.   No murmur heard. Pulmonary/Chest: Effort normal and breath sounds normal. No respiratory distress. She has no wheezes. She has no rales. She exhibits no tenderness.  Abdominal: Soft. Bowel sounds are normal. She exhibits no distension and no mass. There is no tenderness. There is no rebound and no guarding.  Musculoskeletal: Normal range of motion. She exhibits tenderness. She exhibits no  edema.       Tenderness to palpation of soft tissue of lower lumbar back but no midline tenderness to palpation. No skin changes. Pain in lower back with flexion from hips. Negative straight leg raise. Full range of motion of bilateral upper and lower extremities with 5 out of 5 strength. Normal deep tendon reflexes of extremities bilaterally.   Neurological: She is alert and oriented to person, place, and time.  Skin: Skin is warm and dry. No rash noted. She is not diaphoretic. No erythema.  Psychiatric: She has a normal mood and affect.    ED Course  Procedures (including critical care time)  PO ibuprofen  Labs Reviewed - No data to display No results found.   1. Lower back pain       MDM  Soft tissue tenderness to palpation in lower back musculature with some muscle spasticity. No red flags for back pain. No signs or symptoms of central cord compression or cauda equina. Patient has no urinary symptoms denying dysuria, hematuria, or increased urinary frequency. Abdomen soft and nontender. Patient has a primary care provider for which she can followup with the next week for recheck of ongoing symptoms however spoke at length with patient about changing or worsening symptoms that should prompt immediate return to emergency department. Patient voices her understanding and is agreeable to plan.        [place], [place] 06/19/12 [phone]

## 2012-06-19 NOTE — ED Provider Notes (Signed)
Medical screening examination/treatment/procedure(s) were performed by non-physician practitioner and as supervising physician I was immediately available for consultation/collaboration.   Glynn Octave, MD 06/19/12 1616

## 2012-06-19 NOTE — ED Notes (Signed)
Low. Back pain since Thursday. Taking tylenol without relief. Just came on. Nothing chronic.

## 2012-06-19 NOTE — Discharge Instructions (Signed)
Apply ice to your lower back for any active spasming. Use Naprosyn as directed as needed for inflammation and pain using extra strength Tylenol for breakthrough pain. Use Valium as needed for muscle relaxation but do not drive or operate machinery with Valium use. Followup with your primary care doctor in the next week for recheck of ongoing symptoms however return to emergency department any time for emergent changing or worsening symptoms.  Back Pain, Adult Back pain is very common. The pain often gets better over time. The cause of back pain is usually not dangerous. Most people can learn to manage their back pain on their own.  HOME CARE   Stay active. Start with short walks on flat ground if you can. Try to walk farther each day.   Do not sit, drive, or stand in one place for more than 30 minutes. Do not stay in bed.   Do not avoid exercise or work. Activity can help your back heal faster.   Be careful when you bend or lift an object. Bend at your knees, keep the object close to you, and do not twist.   Sleep on a firm mattress. Lie on your side, and bend your knees. If you lie on your back, put a pillow under your knees.   Only take medicines as told by your doctor.   Put ice on the injured area.   Put ice in a plastic bag.   Place a towel between your skin and the bag.   Leave the ice on for 15 to 20 minutes, 3 to 4 times a day for the first 2 to 3 days. After that, you can switch between ice and heat packs.   Ask your doctor about back exercises or massage.   Avoid feeling anxious or stressed. Find good ways to deal with stress, such as exercise.  GET HELP RIGHT AWAY IF:   Your pain does not go away with rest or medicine.   Your pain does not go away in 1 week.   You have new problems.   You do not feel well.   The pain spreads into your legs.   You cannot control when you poop (bowel movement) or pee (urinate).   Your arms or legs feel weak or lose feeling  (numbness).   You feel sick to your stomach (nauseous) or throw up (vomit).   You have belly (abdominal) pain.   You feel like you may pass out (faint).  MAKE SURE YOU:   Understand these instructions.   Will watch your condition.   Will get help right away if you are not doing well or get worse.  Document Released: 05/25/2008 Document Revised: 11/26/2011 Document Reviewed: 04/27/2011 [facility] Patient Information 2012 [place], [place].

## 2012-08-10 ENCOUNTER — Other Ambulatory Visit (HOSPITAL_COMMUNITY): Payer: Self-pay | Admitting: Obstetrics

## 2012-08-10 DIAGNOSIS — Z1231 Encounter for screening mammogram for malignant neoplasm of breast: Secondary | ICD-10-CM

## 2012-09-12 ENCOUNTER — Ambulatory Visit (HOSPITAL_COMMUNITY)
Admission: RE | Admit: 2012-09-12 | Discharge: 2012-09-12 | Disposition: A | Discharge status: Home / Self Care | Payer: Medicaid Other | Source: Ambulatory Visit | Attending: Obstetrics | Admitting: Obstetrics

## 2012-09-12 DIAGNOSIS — Z1231 Encounter for screening mammogram for malignant neoplasm of breast: Secondary | ICD-10-CM

## 2012-11-13 ENCOUNTER — Encounter (HOSPITAL_COMMUNITY): Payer: Self-pay | Admitting: *Deleted

## 2012-11-13 ENCOUNTER — Emergency Department (HOSPITAL_COMMUNITY)
Admission: EM | Admit: 2012-11-13 | Discharge: 2012-11-13 | Disposition: A | Discharge status: Home / Self Care | Payer: Medicaid Other | Attending: Emergency Medicine | Admitting: Emergency Medicine

## 2012-11-13 DIAGNOSIS — Z87891 Personal history of nicotine dependence: Secondary | ICD-10-CM | POA: Insufficient documentation

## 2012-11-13 DIAGNOSIS — I10 Essential (primary) hypertension: Secondary | ICD-10-CM | POA: Insufficient documentation

## 2012-11-13 DIAGNOSIS — Z8742 Personal history of other diseases of the female genital tract: Secondary | ICD-10-CM | POA: Insufficient documentation

## 2012-11-13 DIAGNOSIS — R509 Fever, unspecified: Secondary | ICD-10-CM | POA: Insufficient documentation

## 2012-11-13 DIAGNOSIS — B9789 Other viral agents as the cause of diseases classified elsewhere: Secondary | ICD-10-CM | POA: Insufficient documentation

## 2012-11-13 DIAGNOSIS — B349 Viral infection, unspecified: Secondary | ICD-10-CM

## 2012-11-13 DIAGNOSIS — Z8669 Personal history of other diseases of the nervous system and sense organs: Secondary | ICD-10-CM | POA: Insufficient documentation

## 2012-11-13 LAB — RAPID STREP SCREEN (MED CTR MEBANE ONLY): Streptococcus, Group A Screen (Direct): NEGATIVE

## 2012-11-13 NOTE — ED Provider Notes (Deleted)
History     CSN: [id number]  Arrival date & time 11/13/12  1532   First MD Initiated Contact with Patient 11/13/12 1558      Chief Complaint  Patient presents with  . Sore Throat  . Fever    (Consider location/radiation/quality/duration/timing/severity/associated sxs/prior treatment) HPI  Past Medical History  Diagnosis Date  . Hypertension   . Diabetes in pregnancy   . Migraines   . Ovarian cyst   . Chlamydia   . Trichomonas     Past Surgical History  Procedure Date  . Cholecystectomy 02/2009  . Cesarean section 12/27/2011    Procedure: CESAREAN SECTION;  Surgeon: Brock Bad, MD;  Location: [facility];  Service: Gynecology;  Laterality: N/A;  Primary cesarean section with delivery of baby girl at 62. Apgars 3/3/6    Family History  Problem Relation Age of Onset  . Hypertension Maternal Aunt   . Hypertension Paternal Aunt   . Anesthesia problems Neg Hx   . Hypotension Neg Hx   . Malignant hyperthermia Neg Hx   . Pseudochol deficiency Neg Hx     History  Substance Use Topics  . Smoking status: Former Smoker -- 0.0 packs/day for 16 years    Types: Cigarettes    Quit date: 05/12/2011  . Smokeless tobacco: Never Used  . Alcohol Use: 1.2 oz/week    1 Glasses of wine, 1 Shots of liquor per week     Comment: Occasional, Quit with (+) UPT    OB History    Grav Para Term Preterm Abortions TAB SAB Ect Mult Living   4 3 3  1 1    2       Review of Systems  Allergies  Amoxicillin  Home Medications   Current Outpatient Rx  Name  Route  Sig  Dispense  Refill  . AMLODIPINE BESYLATE 10 MG PO TABS   Oral   Take 10 mg by mouth daily.         Marland Kitchen ESCITALOPRAM OXALATE 10 MG PO TABS   Oral   Take 10 mg by mouth daily.         . NYQUIL PO   Oral   Take 10 mLs by mouth daily as needed. For cold, cough and congestion.         . SERTRALINE HCL 100 MG PO TABS   Oral   Take 100 mg by mouth daily.           BP 142/80  Pulse 99  Temp 100 F (37.8  C) (Oral)  Resp 18  SpO2 97%  LMP 11/06/2012  Physical Exam  ED Course  Procedures (including critical care time)   Labs Reviewed  RAPID STREP SCREEN   No results found.   No diagnosis found.    MDM  Strep test negative.  Likely viral etiology.  Otc meds, return prn.        Geoffery Lyons, MD 11/13/12 [phone]

## 2012-11-13 NOTE — ED Provider Notes (Signed)
History  This chart was scribed for Geoffery Lyons, MD by Shari Heritage, ED Scribe. The patient was seen in room TR09C/TR09C. Patient's care was started at 1558.     CSN: [id number]  Arrival date & time 11/13/12  1532   First MD Initiated Contact with Patient 11/13/12 1558      Chief Complaint  Patient presents with  . Sore Throat  . Fever   The history is provided by the patient. No language interpreter was used.    HPI Comments: Meredith York is a 36 y.o. female who presents to the Emergency Department complaining of generalized body aches and productive cough onset yesterday. There is associated sore throat and low-grade fever. Patient denies ear pain or ear congestion. She says that she is producing thick, white sputum with cough. Patient has a medical history of gestational diabetes, HTN and migraines. She has a surgical history of cholecystectomy and c-section. Patient is a former smoker and uses alcohol occasionally.   Past Medical History  Diagnosis Date  . Hypertension   . Diabetes in pregnancy   . Migraines   . Ovarian cyst   . Chlamydia   . Trichomonas     Past Surgical History  Procedure Date  . Cholecystectomy 02/2009  . Cesarean section 12/27/2011    Procedure: CESAREAN SECTION;  Surgeon: Brock Bad, MD;  Location: [facility];  Service: Gynecology;  Laterality: N/A;  Primary cesarean section with delivery of baby girl at 56. Apgars 3/3/6    Family History  Problem Relation Age of Onset  . Hypertension Maternal Aunt   . Hypertension Paternal Aunt   . Anesthesia problems Neg Hx   . Hypotension Neg Hx   . Malignant hyperthermia Neg Hx   . Pseudochol deficiency Neg Hx     History  Substance Use Topics  . Smoking status: Former Smoker -- 0.0 packs/day for 16 years    Types: Cigarettes    Quit date: 05/12/2011  . Smokeless tobacco: Never Used  . Alcohol Use: 1.2 oz/week    1 Glasses of wine, 1 Shots of liquor per week     Comment: Occasional, Quit with  (+) UPT    OB History    Grav Para Term Preterm Abortions TAB SAB Ect Mult Living   4 3 3  1 1    2       Review of Systems  Constitutional: Positive for fever.  HENT: Positive for sore throat.   Respiratory: Positive for cough.   Musculoskeletal: Positive for myalgias.  All other systems reviewed and are negative.    Allergies  Amoxicillin  Home Medications   Current Outpatient Rx  Name  Route  Sig  Dispense  Refill  . AMLODIPINE BESYLATE 10 MG PO TABS   Oral   Take 10 mg by mouth daily.         Marland Kitchen ESCITALOPRAM OXALATE 10 MG PO TABS   Oral   Take 10 mg by mouth daily.         . NYQUIL PO   Oral   Take 10 mLs by mouth daily as needed. For cold, cough and congestion.         . SERTRALINE HCL 100 MG PO TABS   Oral   Take 100 mg by mouth daily.           Triage Vitals: BP 142/80  Pulse 99  Temp 100 F (37.8 C) (Oral)  Resp 18  SpO2 97%  LMP 11/06/2012  Physical  Exam  Constitutional: She is oriented to person, place, and time. She appears well-developed and well-nourished. No distress.  HENT:  Head: Normocephalic and atraumatic.  Right Ear: External ear normal.  Left Ear: External ear normal.  Mouth/Throat: Posterior oropharyngeal erythema present.       PO mildly erythematous. Slight cervical adenopathy present.   Eyes: EOM are normal.  Neck: Normal range of motion.  Cardiovascular: Normal rate.   Pulmonary/Chest: Effort normal.  Musculoskeletal: Normal range of motion.  Neurological: She is alert and oriented to person, place, and time.  Skin: Skin is warm and dry. No rash noted.  Psychiatric: She has a normal mood and affect. Her behavior is normal.    ED Course  Procedures (including critical care time) DIAGNOSTIC STUDIES: Oxygen Saturation is 97% on room air, normal by my interpretation.    COORDINATION OF CARE: 3:58 PM- Patient informed of current plan for treatment and evaluation and agrees with plan at this time.     1. Viral  syndrome       MDM  Strep test negative.  Likely viral etiology.  Will discharge to home.  Return prn.      I personally performed the services described in this documentation, which was scribed in my presence. The recorded information has been reviewed and is accurate.      Geoffery Lyons, MD 12/05/12 2001

## 2012-11-13 NOTE — ED Notes (Signed)
Reports having sore throat that started yesterday, having neck pain that radiates into back of her head, temp 100.0 at triage. Airway intact.

## 2013-02-23 ENCOUNTER — Emergency Department (HOSPITAL_COMMUNITY)
Admission: EM | Admit: 2013-02-23 | Discharge: 2013-02-23 | Disposition: A | Discharge status: Home / Self Care | Payer: Medicaid Other | Attending: Emergency Medicine | Admitting: Emergency Medicine

## 2013-02-23 ENCOUNTER — Encounter (HOSPITAL_COMMUNITY): Payer: Self-pay | Admitting: *Deleted

## 2013-02-23 ENCOUNTER — Emergency Department (HOSPITAL_COMMUNITY): Payer: Medicaid Other

## 2013-02-23 DIAGNOSIS — I10 Essential (primary) hypertension: Secondary | ICD-10-CM | POA: Insufficient documentation

## 2013-02-23 DIAGNOSIS — R5381 Other malaise: Secondary | ICD-10-CM | POA: Insufficient documentation

## 2013-02-23 DIAGNOSIS — Z8742 Personal history of other diseases of the female genital tract: Secondary | ICD-10-CM | POA: Insufficient documentation

## 2013-02-23 DIAGNOSIS — Z8679 Personal history of other diseases of the circulatory system: Secondary | ICD-10-CM | POA: Insufficient documentation

## 2013-02-23 DIAGNOSIS — R209 Unspecified disturbances of skin sensation: Secondary | ICD-10-CM | POA: Insufficient documentation

## 2013-02-23 DIAGNOSIS — Z8619 Personal history of other infectious and parasitic diseases: Secondary | ICD-10-CM | POA: Insufficient documentation

## 2013-02-23 DIAGNOSIS — Z8632 Personal history of gestational diabetes: Secondary | ICD-10-CM | POA: Insufficient documentation

## 2013-02-23 DIAGNOSIS — Z87891 Personal history of nicotine dependence: Secondary | ICD-10-CM | POA: Insufficient documentation

## 2013-02-23 DIAGNOSIS — R5383 Other fatigue: Secondary | ICD-10-CM | POA: Insufficient documentation

## 2013-02-23 DIAGNOSIS — M25539 Pain in unspecified wrist: Secondary | ICD-10-CM | POA: Insufficient documentation

## 2013-02-23 MED ORDER — IBUPROFEN 400 MG PO TABS
800.0000 mg | ORAL_TABLET | Freq: Once | ORAL | Status: AC
  Administered 2013-02-23: 800 mg via ORAL
  Filled 2013-02-23: qty 2

## 2013-02-23 MED ORDER — NAPROXEN 500 MG PO TABS: 500.0000 mg | ORAL_TABLET | Freq: Two times a day (BID) | ORAL | Status: DC

## 2013-02-23 NOTE — ED Provider Notes (Signed)
History     CSN: [id number]  Arrival date & time 02/23/13  1015   First MD Initiated Contact with Patient 02/23/13 1035      Chief Complaint  Patient presents with  . Hand Pain    (Consider location/radiation/quality/duration/timing/severity/associated sxs/prior treatment) HPI Comments: 37 y/o female presents to the ED complaining of right hand pain x 2 days. States the pain has been present for the past 2 months which has recently worsened. She describes the pain as burning and shooting, rated 10 out of 10 beginning in her wrist and radiating throughout her hand. Pain worse with any wrist movement. Admits to associated numbness throughout her hand when she moves her wrist. Pain worse during the night, and this morning she felt her wrist and hand were weak. She has tried ibuprofen 600 mg and ice to no relief. She denies any recent trauma, injury, fever and chills.   Patient is a 37 y.o. female presenting with hand pain. The history is provided by the patient.  Hand Pain Associated symptoms include arthralgias (positive for right wrist pain), numbness and weakness. Pertinent negatives include no chills, fever or joint swelling.    Past Medical History  Diagnosis Date  . Hypertension   . Diabetes in pregnancy   . Migraines   . Ovarian cyst   . Chlamydia   . Trichomonas     Past Surgical History  Procedure Laterality Date  . Cholecystectomy  02/2009  . Cesarean section  12/27/2011    Procedure: CESAREAN SECTION;  Surgeon: Brock Bad, MD;  Location: [facility];  Service: Gynecology;  Laterality: N/A;  Primary cesarean section with delivery of baby girl at 73. Apgars 3/3/6    Family History  Problem Relation Age of Onset  . Hypertension Maternal Aunt   . Hypertension Paternal Aunt   . Anesthesia problems Neg Hx   . Hypotension Neg Hx   . Malignant hyperthermia Neg Hx   . Pseudochol deficiency Neg Hx     History  Substance Use Topics  . Smoking status: Former Smoker --  0.00 packs/day for 16 years    Types: Cigarettes    Quit date: 05/12/2011  . Smokeless tobacco: Never Used  . Alcohol Use: 1.2 oz/week    1 Glasses of wine, 1 Shots of liquor per week     Comment: Occasional, Quit with (+) UPT    OB History   Grav Para Term Preterm Abortions TAB SAB Ect Mult Living   4 3 3  1 1    2       Review of Systems  Constitutional: Negative for fever and chills.  Musculoskeletal: Positive for arthralgias (positive for right wrist pain). Negative for joint swelling.  Skin: Negative for color change.  Neurological: Positive for weakness and numbness.  All other systems reviewed and are negative.    Allergies  Amoxicillin  Home Medications   Current Outpatient Rx  Name  Route  Sig  Dispense  Refill  . amLODipine (NORVASC) 10 MG tablet   Oral   Take 10 mg by mouth daily.         Marland Kitchen escitalopram (LEXAPRO) 10 MG tablet   Oral   Take 10 mg by mouth daily.         Marland Kitchen ibuprofen (ADVIL,MOTRIN) 200 MG tablet   Oral   Take 600 mg by mouth every 6 (six) hours as needed for pain.         Marland Kitchen EXPIRED: amLODipine (NORVASC) 10 MG tablet  Oral   Take 10 mg by mouth daily.           BP 154/91  Pulse 100  Temp(Src) 98 F (36.7 C) (Oral)  Resp 16  SpO2 98%  Physical Exam  Nursing note and vitals reviewed. Constitutional: She is oriented to person, place, and time. She appears well-developed and well-nourished. No distress.  HENT:  Head: Normocephalic and atraumatic.  Eyes: Conjunctivae and EOM are normal.  Neck: Normal range of motion. Neck supple.  Cardiovascular: Normal rate, regular rhythm, normal heart sounds and intact distal pulses.   Pulmonary/Chest: Effort normal and breath sounds normal.  Musculoskeletal:       Right wrist: She exhibits decreased range of motion (ROM limited in all directions due to pain), tenderness and bony tenderness. She exhibits no swelling.       Right hand: She exhibits normal range of motion, no tenderness  and normal capillary refill. Normal sensation noted.  Right wrist tenderness generalized throughout entire wrist, more prominent ulnar styloid.  Neurological: She is alert and oriented to person, place, and time. No sensory deficit.  Decreased grip strength on the right 3/5 compared to left 5 out of 5. Negative Phalen's and negative Tinel's. No atrophy of thenar eminence.  Skin: Skin is warm and dry. No bruising and no ecchymosis noted. No erythema.  Psychiatric: She has a normal mood and affect. Her behavior is normal.    ED Course  Procedures (including critical care time)  Labs Reviewed - No data to display Dg Wrist Complete Right  02/23/2013  *RADIOLOGY REPORT*  Clinical Data: Hand pain  RIGHT WRIST - COMPLETE 3+ VIEW  Comparison: None.  Findings: Four views of the right wrist submitted.  No acute fracture or subluxation.  No radiopaque foreign body.  IMPRESSION: No acute fracture or subluxation.   Original Report Authenticated By: Natasha Mead, M.D.      1. Wrist pain, right       MDM  37 year old female with right wrist pain. X-ray without any acute abnormality. Initial thought of carpal tunnel with history provided, however physical exam did not match. I will provide her with a wrist splint and naproxen. Conservative measures discussed. She'll followup with her PCP next week. Patient states understanding of plan and is agreeable.        Trevor Mace, PA-C 02/23/13 1226

## 2013-02-23 NOTE — ED Notes (Signed)
Pt reports pain to right hand x 3-4 days. No known injury.

## 2013-02-24 NOTE — ED Provider Notes (Signed)
Medical screening examination/treatment/procedure(s) were performed by non-physician practitioner and as supervising physician I was immediately available for consultation/collaboration.    Vida Roller, MD 02/24/13 [phone]

## 2013-08-28 ENCOUNTER — Encounter (HOSPITAL_BASED_OUTPATIENT_CLINIC_OR_DEPARTMENT_OTHER): Payer: Medicaid Other

## 2013-08-29 ENCOUNTER — Encounter (HOSPITAL_COMMUNITY): Payer: Self-pay | Admitting: Emergency Medicine

## 2013-08-29 ENCOUNTER — Emergency Department (HOSPITAL_COMMUNITY): Payer: Medicaid Other

## 2013-08-29 ENCOUNTER — Emergency Department (HOSPITAL_COMMUNITY)
Admission: EM | Admit: 2013-08-29 | Discharge: 2013-08-29 | Disposition: A | Discharge status: Home / Self Care | Payer: Medicaid Other | Attending: Emergency Medicine | Admitting: Emergency Medicine

## 2013-08-29 DIAGNOSIS — M25569 Pain in unspecified knee: Secondary | ICD-10-CM | POA: Insufficient documentation

## 2013-08-29 DIAGNOSIS — M79605 Pain in left leg: Secondary | ICD-10-CM

## 2013-08-29 DIAGNOSIS — Z79899 Other long term (current) drug therapy: Secondary | ICD-10-CM | POA: Insufficient documentation

## 2013-08-29 DIAGNOSIS — Z8619 Personal history of other infectious and parasitic diseases: Secondary | ICD-10-CM | POA: Insufficient documentation

## 2013-08-29 DIAGNOSIS — IMO0001 Reserved for inherently not codable concepts without codable children: Secondary | ICD-10-CM | POA: Insufficient documentation

## 2013-08-29 DIAGNOSIS — Z87891 Personal history of nicotine dependence: Secondary | ICD-10-CM | POA: Insufficient documentation

## 2013-08-29 DIAGNOSIS — Z8632 Personal history of gestational diabetes: Secondary | ICD-10-CM | POA: Insufficient documentation

## 2013-08-29 DIAGNOSIS — I10 Essential (primary) hypertension: Secondary | ICD-10-CM | POA: Insufficient documentation

## 2013-08-29 DIAGNOSIS — Z8742 Personal history of other diseases of the female genital tract: Secondary | ICD-10-CM | POA: Insufficient documentation

## 2013-08-29 MED ORDER — NAPROXEN 500 MG PO TABS: 500.0000 mg | ORAL_TABLET | Freq: Two times a day (BID) | ORAL | Status: DC

## 2013-08-29 MED ORDER — TRAMADOL HCL 50 MG PO TABS: 50.0000 mg | ORAL_TABLET | Freq: Four times a day (QID) | ORAL | Status: DC | PRN

## 2013-08-29 NOTE — ED Notes (Signed)
C/o right anterior and posterior knee pain x 1 month. No known injury. No deformity.

## 2013-08-29 NOTE — ED Provider Notes (Signed)
CSN: [id number]     Arrival date & time 08/29/13  0946 History   First MD Initiated Contact with Patient 08/29/13 [phone]     Chief Complaint  Patient presents with  . Leg Pain   (Consider location/radiation/quality/duration/timing/severity/associated sxs/prior Treatment) HPI Comments: Patient presents with complaint of right knee pain that is been constant for the past one month. She did not fall or injure her knee. Patient describes the pain being centered in her knee but then radiates to her right calf and right thigh. She denies swelling in her right leg. She denies fever or redness. She has taken ibuprofen which helps temporarily. Onset of symptoms gradual. Course is constant. Pain is there whether she is moving, walking or sitting. She denies DVT risk factors with immobilizations, surgeries, estrogen use. She has no previous history of blood clot.  Patient is a 37 y.o. female presenting with leg pain. The history is provided by the patient.  Leg Pain Associated symptoms: no back pain and no neck pain     Past Medical History  Diagnosis Date  . Hypertension   . Diabetes in pregnancy   . Migraines   . Ovarian cyst   . Chlamydia   . Trichomonas    Past Surgical History  Procedure Laterality Date  . Cholecystectomy  02/2009  . Cesarean section  12/27/2011    Procedure: CESAREAN SECTION;  Surgeon: Brock Bad, MD;  Location: [facility];  Service: Gynecology;  Laterality: N/A;  Primary cesarean section with delivery of baby girl at 76. Apgars 3/3/6   Family History  Problem Relation Age of Onset  . Hypertension Maternal Aunt   . Hypertension Paternal Aunt   . Anesthesia problems Neg Hx   . Hypotension Neg Hx   . Malignant hyperthermia Neg Hx   . Pseudochol deficiency Neg Hx    History  Substance Use Topics  . Smoking status: Former Smoker -- 0.00 packs/day for 16 years    Types: Cigarettes    Quit date: 05/12/2011  . Smokeless tobacco: Never Used  . Alcohol Use: 1.2 oz/week      1 Glasses of wine, 1 Shots of liquor per week     Comment: Occasional, Quit with (+) UPT   OB History   Grav Para Term Preterm Abortions TAB SAB Ect Mult Living   4 3 3  1 1    2      Review of Systems  Constitutional: Negative for activity change.  HENT: Negative for neck pain.   Musculoskeletal: Positive for myalgias and arthralgias. Negative for back pain and joint swelling.  Skin: Negative for wound.  Neurological: Negative for weakness and numbness.    Allergies  Amoxicillin  Home Medications   Current Outpatient Rx  Name  Route  Sig  Dispense  Refill  . acetaminophen (TYLENOL) 500 MG tablet   Oral   Take 1,000 mg by mouth every 6 (six) hours as needed for pain.         Marland Kitchen amLODipine (NORVASC) 10 MG tablet   Oral   Take 10 mg by mouth daily.         Marland Kitchen escitalopram (LEXAPRO) 10 MG tablet   Oral   Take 10 mg by mouth daily.         . metFORMIN (GLUCOPHAGE) 500 MG tablet   Oral   Take 500 mg by mouth 2 (two) times daily with a meal.          BP 171/101  Pulse 77  Temp(Src) 97.7 F (36.5 C) (Oral)  Resp 20  SpO2 100%  LMP 08/13/2013 Physical Exam  Nursing note and vitals reviewed. Constitutional: She appears well-developed and well-nourished.  HENT:  Head: Normocephalic and atraumatic.  Eyes: Pupils are equal, round, and reactive to light.  Neck: Normal range of motion. Neck supple.  Cardiovascular: Exam reveals no decreased pulses.   Musculoskeletal: She exhibits tenderness. She exhibits no edema.       Right hip: Normal. She exhibits normal range of motion and no tenderness.       Right knee: She exhibits normal range of motion and no swelling. Tenderness (generalized) found. No medial joint line and no lateral joint line tenderness noted.       Right ankle: Normal. No tenderness.       Right upper leg: Normal. She exhibits no swelling.       Right lower leg: She exhibits tenderness. She exhibits no swelling and no edema.       Legs:       Right foot: She exhibits normal range of motion, no tenderness and no bony tenderness.  Neurological: She is alert. No sensory deficit.  Motor, sensation, and vascular distal to the injury is fully intact.   Skin: Skin is warm and dry.  Psychiatric: She has a normal mood and affect.    ED Course  Procedures (including critical care time) Labs Review Labs Reviewed - No data to display Imaging Review Dg Knee Complete 4 Views Right  08/29/2013   *RADIOLOGY REPORT*  Clinical Data: Pain  RIGHT KNEE - COMPLETE 4+ VIEW  Comparison: None.  Findings: Frontal, lateral, and bilateral oblique views were obtained. There are is no fracture, dislocation, or effusion. Joint spaces appear intact.  No erosive change.  IMPRESSION: No abnormality noted.   Original Report Authenticated By: Bretta Bang, M.D.    10:23 AM Patient seen and examined. Work-up initiated.   Vital signs reviewed and are as follows: Filed Vitals:   08/29/13 0955  BP: 171/101  Pulse: 77  Temp: 97.7 F (36.5 C)  Resp: 20   10:46 AM x-ray negative, results reviewed.  Patient urged to followup with primary care physician or orthopedic referral in one week.  Patient was counseled on RICE protocol and told to rest injury, use ice for no longer than 15 minutes every hour, compress the area, and elevate above the level of their heart as much as possible to reduce swelling.  Questions answered.  Patient verbalized understanding.    Patient counseled on use of narcotic pain medications. Counseled not to combine these medications with others containing tylenol. Urged not to drink alcohol, drive, or perform any other activities that requires focus while taking these medications. The patient verbalizes understanding and agrees with the plan.  She is to return with worsening swelling of her leg or trouble walking.  MDM   1. Leg pain, left    Patient with left knee pain with radiation into her leg. X-rays negative. She does not have  any gross edema or signs of DVT. She does not have risk factors for DVT other than smoking. Given exam, I do not suspect DVT. I do not feel that ultrasound is needed at this point. Patient is on no medications which I would expect to cause myalgias. Conservative management with PCP/ortho f/u indicated.     Renne Crigler, PA-C 08/29/13 1048

## 2013-08-31 NOTE — ED Provider Notes (Signed)
Medical screening examination/treatment/procedure(s) were performed by non-physician practitioner and as supervising physician I was immediately available for consultation/collaboration.   Candyce Churn, MD 08/31/13 [phone]

## 2014-03-05 ENCOUNTER — Encounter (HOSPITAL_COMMUNITY): Payer: Self-pay | Admitting: Emergency Medicine

## 2014-03-05 ENCOUNTER — Emergency Department (HOSPITAL_COMMUNITY): Payer: Medicaid Other

## 2014-03-05 ENCOUNTER — Emergency Department (HOSPITAL_COMMUNITY)
Admission: EM | Admit: 2014-03-05 | Discharge: 2014-03-05 | Disposition: A | Discharge status: Home / Self Care | Payer: Medicaid Other | Attending: Emergency Medicine | Admitting: Emergency Medicine

## 2014-03-05 DIAGNOSIS — R05 Cough: Secondary | ICD-10-CM

## 2014-03-05 DIAGNOSIS — Z8679 Personal history of other diseases of the circulatory system: Secondary | ICD-10-CM | POA: Insufficient documentation

## 2014-03-05 DIAGNOSIS — J029 Acute pharyngitis, unspecified: Secondary | ICD-10-CM | POA: Insufficient documentation

## 2014-03-05 DIAGNOSIS — Z8619 Personal history of other infectious and parasitic diseases: Secondary | ICD-10-CM | POA: Insufficient documentation

## 2014-03-05 DIAGNOSIS — Z87891 Personal history of nicotine dependence: Secondary | ICD-10-CM | POA: Insufficient documentation

## 2014-03-05 DIAGNOSIS — R059 Cough, unspecified: Secondary | ICD-10-CM | POA: Insufficient documentation

## 2014-03-05 DIAGNOSIS — Z79899 Other long term (current) drug therapy: Secondary | ICD-10-CM | POA: Insufficient documentation

## 2014-03-05 DIAGNOSIS — Z8632 Personal history of gestational diabetes: Secondary | ICD-10-CM | POA: Insufficient documentation

## 2014-03-05 DIAGNOSIS — Z8742 Personal history of other diseases of the female genital tract: Secondary | ICD-10-CM | POA: Insufficient documentation

## 2014-03-05 DIAGNOSIS — I10 Essential (primary) hypertension: Secondary | ICD-10-CM | POA: Insufficient documentation

## 2014-03-05 MED ORDER — ALBUTEROL SULFATE HFA 108 (90 BASE) MCG/ACT IN AERS
2.0000 | INHALATION_SPRAY | Freq: Once | RESPIRATORY_TRACT | Status: AC
  Administered 2014-03-05: 2 via RESPIRATORY_TRACT
  Filled 2014-03-05: qty 6.7

## 2014-03-05 MED ORDER — GUAIFENESIN-DM 100-10 MG/5ML PO SYRP: 5.0000 mL | ORAL_SOLUTION | ORAL | Status: DC | PRN

## 2014-03-05 NOTE — Discharge Instructions (Signed)
Take 2 puffs every 4-6 hours as needed for cough  Drink fluids and rest  Return to the emergency department if you develop any changing/worsening condition, difficulty breathing, coughing up blood, chest pain, repeated vomiting, or any other concerns (please read additional information regarding your condition below)   Cough, Adult  A cough is a reflex that helps clear your throat and airways. It can help heal the body or may be a reaction to an irritated airway. A cough may only last 2 or 3 weeks (acute) or may last more than 8 weeks (chronic).  CAUSES Acute cough:  Viral or bacterial infections. Chronic cough:  Infections.  Allergies.  Asthma.  Post-nasal drip.  Smoking.  Heartburn or acid reflux.  Some medicines.  Chronic lung problems (COPD).  Cancer. SYMPTOMS   Cough.  Fever.  Chest pain.  Increased breathing rate.  High-pitched whistling sound when breathing (wheezing).  Colored mucus that you cough up (sputum). TREATMENT   A bacterial cough may be treated with antibiotic medicine.  A viral cough must run its course and will not respond to antibiotics.  Your caregiver may recommend other treatments if you have a chronic cough. HOME CARE INSTRUCTIONS   Only take over-the-counter or prescription medicines for pain, discomfort, or fever as directed by your caregiver. Use cough suppressants only as directed by your caregiver.  Use a cold steam vaporizer or humidifier in your bedroom or home to help loosen secretions.  Sleep in a semi-upright position if your cough is worse at night.  Rest as needed.  Stop smoking if you smoke. SEEK IMMEDIATE MEDICAL CARE IF:   You have pus in your sputum.  Your cough starts to worsen.  You cannot control your cough with suppressants and are losing sleep.  You begin coughing up blood.  You have difficulty breathing.  You develop pain which is getting worse or is uncontrolled with medicine.  You have a  fever. MAKE SURE YOU:   Understand these instructions.  Will watch your condition.  Will get help right away if you are not doing well or get worse. Document Released: 06/05/2011 Document Revised: 02/29/2012 Document Reviewed: 06/05/2011 [facility] Patient Information 2014 [place].  Upper Respiratory Infection, Adult An upper respiratory infection (URI) is also known as the common cold. It is often caused by a type of germ (virus). Colds are easily spread (contagious). You can pass it to others by kissing, coughing, sneezing, or drinking out of the same glass. Usually, you get better in 1 or 2 weeks.  HOME CARE   Only take medicine as told by your doctor.  Use a warm mist humidifier or breathe in steam from a hot shower.  Drink enough water and fluids to keep your pee (urine) clear or pale yellow.  Get plenty of rest.  Return to work when your temperature is back to normal or as told by your doctor. You may use a face mask and wash your hands to stop your cold from spreading. GET HELP RIGHT AWAY IF:   After the first few days, you feel you are getting worse.  You have questions about your medicine.  You have chills, shortness of breath, or brown or red spit (mucus).  You have yellow or brown snot (nasal discharge) or pain in the face, especially when you bend forward.  You have a fever, puffy (swollen) neck, pain when you swallow, or white spots in the back of your throat.  You have a bad headache, ear pain,  sinus pain, or chest pain.  You have a high-pitched whistling sound when you breathe in and out (wheezing).  You have a lasting cough or cough up blood.  You have sore muscles or a stiff neck. MAKE SURE YOU:   Understand these instructions.  Will watch your condition.  Will get help right away if you are not doing well or get worse. Document Released: 05/25/2008 Document Revised: 02/29/2012 Document Reviewed: 04/13/2011 [facility] Patient Information 2014  [place], [place].  Hypertension As your heart beats, it forces blood through your arteries. This force is your blood pressure. If the pressure is too high, it is called hypertension (HTN) or high blood pressure. HTN is dangerous because you may have it and not know it. High blood pressure may mean that your heart has to work harder to pump blood. Your arteries may be narrow or stiff. The extra work puts you at risk for heart disease, stroke, and other problems.  Blood pressure consists of two numbers, a higher number over a lower, 110/72, for example. It is stated as "110 over 72." The ideal is below 120 for the top number (systolic) and under 80 for the bottom (diastolic). Write down your blood pressure today. You should pay close attention to your blood pressure if you have certain conditions such as:  Heart failure.  Prior heart attack.  Diabetes  Chronic kidney disease.  Prior stroke.  Multiple risk factors for heart disease. To see if you have HTN, your blood pressure should be measured while you are seated with your arm held at the level of the heart. It should be measured at least twice. A one-time elevated blood pressure reading (especially in the Emergency Department) does not mean that you need treatment. There may be conditions in which the blood pressure is different between your right and left arms. It is important to see your caregiver soon for a recheck. Most people have essential hypertension which means that there is not a specific cause. This type of high blood pressure may be lowered by changing lifestyle factors such as:  Stress.  Smoking.  Lack of exercise.  Excessive weight.  Drug/tobacco/alcohol use.  Eating less salt. Most people do not have symptoms from high blood pressure until it has caused damage to the body. Effective treatment can often prevent, delay or reduce that damage. TREATMENT  When a cause has been identified, treatment for high blood pressure is  directed at the cause. There are a large number of medications to treat HTN. These fall into several categories, and your caregiver will help you select the medicines that are best for you. Medications may have side effects. You should review side effects with your caregiver. If your blood pressure stays high after you have made lifestyle changes or started on medicines,   Your medication(s) may need to be changed.  Other problems may need to be addressed.  Be certain you understand your prescriptions, and know how and when to take your medicine.  Be sure to follow up with your caregiver within the time frame advised (usually within two weeks) to have your blood pressure rechecked and to review your medications.  If you are taking more than one medicine to lower your blood pressure, make sure you know how and at what times they should be taken. Taking two medicines at the same time can result in blood pressure that is too low. SEEK IMMEDIATE MEDICAL CARE IF:  You develop a severe headache, blurred or changing vision, or  confusion.  You have unusual weakness or numbness, or a faint feeling.  You have severe chest or abdominal pain, vomiting, or breathing problems. MAKE SURE YOU:   Understand these instructions.  Will watch your condition.  Will get help right away if you are not doing well or get worse. Document Released: 12/07/2005 Document Revised: 02/29/2012 Document Reviewed: 07/27/2008 [facility] Patient Information 2014 [place].  Emergency Department Resource Guide 1) Find a Doctor and Pay Out of Pocket Although you won't have to find out who is covered by your insurance plan, it is a good idea to ask around and get recommendations. You will then need to call the office and see if the doctor you have chosen will accept you as a new patient and what types of options they offer for patients who are self-pay. Some doctors offer discounts or will set up payment plans for their  patients who do not have insurance, but you will need to ask so you aren't surprised when you get to your appointment.  2) Contact Your Local Health Department Not all health departments have doctors that can see patients for sick visits, but many do, so it is worth a call to see if yours does. If you don't know where your local health department is, you can check in your phone book. The CDC also has a tool to help you locate your state's health department, and many state websites also have listings of all of their local health departments.  3) Find a [facility] If your illness is not likely to be very severe or complicated, you may want to try a walk in clinic. These are popping up all over the country in pharmacies, drugstores, and shopping centers. They're usually staffed by nurse practitioners or physician assistants that have been trained to treat common illnesses and complaints. They're usually fairly quick and inexpensive. However, if you have serious medical issues or chronic medical problems, these are probably not your best option.  No Primary Care Doctor: - Call Health Connect at  [phone] - they can help you locate a primary care doctor that  accepts your insurance, provides certain services, etc. - Physician Referral Service- [phone]  Chronic Pain Problems: Organization         Address  Phone   Notes  [facility]  [phone] Patients need to be referred by their primary care doctor.   Medication Assistance: Organization         Address  Phone   Notes  [facility] Medication [facility] [address] [place], [place] [address] [phone] --Must be a resident of [facility] -- Must have NO insurance coverage whatsoever (no Medicaid/ Medicare, etc.) -- The pt. MUST have a primary care doctor that directs their care regularly and follows them in the community   MedAssist  [phone]   [employer]  [phone]     Agencies that provide inexpensive medical care: Organization         Address  Phone   Notes  [place]  [phone]   [facility]    [phone]   [facility], [place] [address] [phone]   [place] [address], [place] [phone]   Planned Parenthood    [phone]   [place] [facility]  [facility]inic    [phone]   Community Health and [place]  [phone]  [address], [place] Phone:  [phone], Fax:  [phone] Hours of Operation:  9 am - 6 pm, M-F.  Also accepts Medicaid/Medicare and self-pay.  [facility] for [place], [address], [place] Phone: [phone], Fax: [phone]. Hours of Operation:  8:30 am - 5:30 pm, M-F.  Also accepts Medicaid and self-pay.  [facility] [place] [address] [place] Phone: [phone]   [place], [place], [place] [phone], Ext. [phone] Mondays & Thursdays: 7-9 AM.  First 15 patients are seen on a first come, first serve basis.    [place] Providers:  Organization         Address  Phone   Notes  [facility] [address], [place] [phone] Also accepts self-pay patients.  [facility] [address], [place]  [phone]   [place], [address], [place] [phone]   [facility] [address], [place] [phone]   Lucianne Lei [address], [place]   [phone] Only accepts [place] Access [place] patients after they have their name applied to their card.   Self-Pay (no insurance) in [facility]:  Organization         Address  Phone   Notes  Sickle Cell Patients, [facility] [place] [phone]    [facility] [place] [phone]   [facility] [place]  [place], [place], [place] [phone]   [facility]/Dr. Osei-Bonsu  [address] [place] or [address], [place] [phone] Phone number for both [place] and [place] locations is the same.  Urgent Medical and [facility] [address], [place] [phone]   [facility] [address], [place] or [address] [phone]   [facility] [address], [place] [phone], phone; [phone], fax Sees patients 1st and 3rd Saturday of every month.  Must not qualify for public or private insurance (i.e. Medicaid, Medicare, [place] Health Choice, Veterans' Benefits)  Household income should be no more than 200% of the poverty level The clinic cannot treat you if you are pregnant or think you are pregnant  Sexually transmitted diseases are not treated at the clinic.    Dental Care: Organization         Address  Phone  Notes  [facility] Department of [facility] [place] [phone] Accepts children up to age 16 who are enrolled in [place] or [place]; pregnant women with a Medicaid card; and children who have applied for Medicaid or [place] Health Choice, but were declined, whose parents can pay a reduced fee at time of service.  [facility] Department of [facility]  [address], [place] [phone] Accepts children up to age 35 who are enrolled in [place] or [place]; pregnant women with a Medicaid card; and children who have applied for Medicaid or [place] Health Choice, but were declined, whose parents can pay a reduced fee at time of service.  [facility] Adult Dental Access PROGRAM  [place] [phone] Patients are  seen by  appointment only. Walk-ins are not accepted. [place] will see patients 58 years of age and older. Monday - Tuesday (8am-5pm) Most Wednesdays (8:30-5pm) $30 per visit, cash only  [facility] Adult Dental Access PROGRAM  [address], [facility] [phone] Patients are seen by appointment only. Walk-ins are not accepted. [place] will see patients 34 years of age and older. One Wednesday Evening (Monthly: Volunteer Based).  $30 per visit, cash only  [place]  [phone] for adults; Children under age 73, call Graduate Pediatric Dentistry at [phone]. Children aged 93-14, please call [phone] to request a pediatric application.  Dental services are provided in all areas of dental care including fillings, crowns and bridges, complete and partial dentures, implants, gum treatment, root canals, and extractions. Preventive care is also provided. Treatment is provided to both adults and children. Patients are selected via a lottery and there is often a waiting list.   [facility] [address] [place]  [phone] [email or web address]   [facility] [address] [place], [place] [phone], Ext. [phone] Second and Fourth Thursday of each month, opens at 6:30 AM; Clinic ends at 9 AM.  Patients are seen on a first-come first-served basis, and a limited number are seen during each clinic.   [facility]  [address] Hillard Danker [place], [place] [phone]   Eligibility Requirements You must have lived in [place], [place], or [place] counties for at least the last three months.   You cannot be eligible for state or federal sponsored [employer], including [employer], [place], or [employer].   You generally cannot be eligible for healthcare insurance through your employer.    How to apply: Eligibility screenings are held every Tuesday and Wednesday afternoon from 1:00 pm until 4:00 pm. You  do not need an appointment for the interview!  [facility] [address], [place], [place]   [place]  [place] Department  [place]  [phone]    Behavioral Health Resources in the Community: Intensive Outpatient Programs Organization         Address  Phone  Notes  Selma Many. [address] [place], [place] [phone]   [facility] [address] [place], [place]   ADS: Alcohol & Drug Svcs [address], [place], [place]   [place] [address]  [place], [place] or [phone]   Substance Abuse Resources Organization         Address  Phone  Notes  Alcohol and Drug Services  [phone]   [place]  [phone]   [facility] [place]   Chinita Pester  [phone]   Residential & Outpatient Substance Abuse Program  [phone]   Psychological Services Organization         Address  Phone  Notes  [facility] [place]  [place]  [phone]   [place] [address] [place] or [phone]    Mobile Crisis Teams Organization         Address  Phone  Notes  [facility], Mobile Crisis Care Unit  [phone]   [facility] Psychotherapeutic Services  [address] [place], [place]   [facility] [address] [place]  [place] [phone]    Self-Help/Support Groups Organization         Address  Phone             Notes  [facility]ental [facility]oc[facility] of [place] - variety of support groups  [place] Call for more information  Narcotics Anonymous (NA), [facility] [address], [employer] [place]  2 meetings at this location   Special educational needs teacher          Address  Phone  Notes  [facility] [place],    [place]  [phone]   [facility]  [address] [place] [phone], [place], [place]   [place], [place] [phone] Admissions: 8am-3pm M-F  Incentives Substance [place] [address],    [place], [place] [phone]   [facility] [address] [place], [place], [place]   [facility] [address],  [place], [place]   [facility]ight Programs - Intensive Outpatient [address]een [address], [place], [place]   [facility].,  [place], [place] [phone] or [phone]   [facility] (RT[facility]) [address], [place], [place] Accepts Medicaid  [facility] [place] [address],  [place] [phone] Substance Abuse/Addiction Treatment   [facility] Organization         Address  Phone  Notes  [facility]  [phone]   Domenic Schwab, PhD [address] Arlis [place], [place]   [phone] or [phone]   [place] [employer] [place], [place] [phone]   [facility] [address], [place], [place] [phone] Insurance/Medicaid/sponsorship through [facility] and Families [address] [place]                                    [place], [place] [phone] [place] [address], [place] [phone]    Dr. Adele Schilder  [phone]   [facility] Dept. 1) [address] [place] 2) [place] 3)  [place] [address], [place] [phone]  [phone]   [place] [phone] or [phone] (After Hours)

## 2014-03-05 NOTE — ED Notes (Signed)
Pt complains of hacking dry nagging cough and sore throat, ongoing since Friday.

## 2014-03-05 NOTE — ED Provider Notes (Signed)
CSN: [id number]     Arrival date & time 03/05/14  1024 History  This chart was scribed for non-physician practitioner, Vernie Murders, PA-C working with Shaune Pollack, MD by Frederich Balding, ED scribe. This patient was seen in room TR08C/TR08C and the patient's care was started at 12:20 PM.   Chief Complaint  Patient presents with  . Cough  . Sore Throat   The history is provided by the patient. No language interpreter was used.   HPI Comments: Meredith York is a 38 y.o. female with history of hypertension and diabetes who presents to the Emergency Department complaining of nonproductive cough and sore throat that stared 3 days ago. Pt intermittently has productive cough of clear sputum. Sore throat is described as "scratchy" and worse with coughing. She has a fever of 100.0 two days ago but it was relieved by tylenol. Pt has taken robitussin DM with no relief. She has similar symptoms over one week ago but they resolved. Denies nasal congestion, rhinorrhea, wheezing, hemoptysis, SOB, chest pain, nausea, emesis, diarrhea. She states her daughter has a cough also but it is starting to resolve. Pt smokes about 2 cigarettes per day but is trying to quit. She takes her hypertension medications but has not taken them today.    Past Medical History  Diagnosis Date  . Hypertension   . Diabetes in pregnancy   . Migraines   . Ovarian cyst   . Chlamydia   . Trichomonas    Past Surgical History  Procedure Laterality Date  . Cholecystectomy  02/2009  . Cesarean section  12/27/2011    Procedure: CESAREAN SECTION;  Surgeon: Shelly Bombard, MD;  Location: [facility];  Service: Gynecology;  Laterality: N/A;  Primary cesarean section with delivery of baby girl at 74. Apgars 3/3/6   Family History  Problem Relation Age of Onset  . Hypertension Maternal Aunt   . Hypertension Paternal Aunt   . Anesthesia problems Neg Hx   . Hypotension Neg Hx   . Malignant hyperthermia Neg Hx   . Pseudochol deficiency  Neg Hx    History  Substance Use Topics  . Smoking status: Former Smoker -- 0.00 packs/day for 16 years    Types: Cigarettes    Quit date: 05/12/2011  . Smokeless tobacco: Never Used  . Alcohol Use: 1.2 oz/week    1 Glasses of wine, 1 Shots of liquor per week     Comment: Occasional, Quit with (+) UPT   OB History   Grav Para Term Preterm Abortions TAB SAB Ect Mult Living   4 3 3  1 1    2      Review of Systems  Constitutional: Negative for fever.  HENT: Positive for sore throat. Negative for congestion and rhinorrhea.   Respiratory: Positive for cough. Negative for wheezing.   Cardiovascular: Negative for chest pain.  Gastrointestinal: Negative for nausea, vomiting and diarrhea.  All other systems reviewed and are negative.   Allergies  Amoxicillin  Home Medications   Current Outpatient Rx  Name  Route  Sig  Dispense  Refill  . acetaminophen (TYLENOL) 500 MG tablet   Oral   Take 1,000 mg by mouth every 6 (six) hours as needed for pain.         Marland Kitchen amLODipine (NORVASC) 10 MG tablet   Oral   Take 10 mg by mouth daily.         Marland Kitchen escitalopram (LEXAPRO) 10 MG tablet   Oral   Take 10  mg by mouth daily.         . metFORMIN (GLUCOPHAGE) 500 MG tablet   Oral   Take 500 mg by mouth 2 (two) times daily with a meal.          BP 166/114  Pulse 93  Temp(Src) 98.8 F (37.1 C) (Oral)  Resp 18  Ht 5\' 2"  (1.575 m)  Wt 212 lb (96.163 kg)  BMI 38.77 kg/m2  SpO2 99%  Filed Vitals:   03/05/14 1043 03/05/14 1045 03/05/14 1243  BP: 166/114  132/92  Pulse: 93  102  Temp: 98.8 F (37.1 C)  98.2 F (36.8 C)  TempSrc: Oral  Oral  Resp: 18  18  Height:  5\' 2"  (1.575 m)   Weight:  212 lb (96.163 kg)   SpO2: 99%  99%    Physical Exam  Nursing note and vitals reviewed. Constitutional: She is oriented to person, place, and time. She appears well-developed and well-nourished. No distress.  HENT:  Head: Normocephalic and atraumatic.  Right Ear: External ear normal.   Left Ear: External ear normal.  Nose: Nose normal.  Mouth/Throat: Oropharynx is clear and moist.  Tympanic membranes gray and translucent bilaterally with no erythema, edema, or hemotympanum.  No mastoid or tragal tenderness bilaterally. No erythema to the posterior pharynx. Tonsils without edema or exudates. Uvula midline. No trismus. No difficulty controlling secretions.   Eyes: Conjunctivae and EOM are normal. Pupils are equal, round, and reactive to light. Right eye exhibits no discharge. Left eye exhibits no discharge.  Neck: Neck supple. No tracheal deviation present.  No cervical lymphadenopathy. No nuchal rigidity.   Cardiovascular: Normal rate, regular rhythm, normal heart sounds and intact distal pulses.  Exam reveals no gallop and no friction rub.   No murmur heard. Pulmonary/Chest: Effort normal and breath sounds normal. No respiratory distress. She has no wheezes. She has no rales. She exhibits no tenderness.  Intermittent coughing throughout exam  Abdominal: Soft. She exhibits no distension. There is no tenderness.  Musculoskeletal: Normal range of motion. She exhibits no edema and no tenderness.  No LE edema or calf tenderness bilaterally  Neurological: She is alert and oriented to person, place, and time.  Skin: Skin is warm and dry. She is not diaphoretic.  Psychiatric: She has a normal mood and affect. Her behavior is normal.    ED Course  Procedures (including critical care time)  DIAGNOSTIC STUDIES: Oxygen Saturation is 99% on RA, normal by my interpretation.    COORDINATION OF CARE: 12:24 PM-Discussed treatment plan which includes and inhaler and PCP follow up with pt at bedside and pt agreed to plan. Return precautions given.   Labs Review Labs Reviewed - No data to display Imaging Review Dg Chest 2 View (if Patient Has Fever And/or Copd)  03/05/2014   CLINICAL DATA:  Cough, sore throat  EXAM: CHEST  2 VIEW  COMPARISON:  07/26/2008  FINDINGS: Cardiomediastinal  silhouette is stable. No acute infiltrate or pleural effusion. No pulmonary edema. Bony thorax is unremarkable.  IMPRESSION: No active cardiopulmonary disease.   Electronically Signed   By: Lahoma Crocker M.D.   On: 03/05/2014 11:30     EKG Interpretation None      DG Chest 2 View (if patient has fever and/or COPD) (Final result)  Result time: 03/05/14 11:30:06    Final result by Rad Results In Interface (03/05/14 11:30:06)    Narrative:   CLINICAL DATA: Cough, sore throat  EXAM: CHEST 2 VIEW  COMPARISON:  07/26/2008  FINDINGS: Cardiomediastinal silhouette is stable. No acute infiltrate or pleural effusion. No pulmonary edema. Bony thorax is unremarkable.  IMPRESSION: No active cardiopulmonary disease.   Electronically Signed By: Lahoma Crocker M.D. On: 03/05/2014 11:30    MDM   Meredith York is a 38 y.o. female with history of hypertension and diabetes who presents to the Emergency Department complaining of nonproductive cough and sore throat that stared 3 days ago.  Rechecks  12:30 PM = Patient reports improved symptoms after albuterol treatment.    Etiology of cough possibly due to URI vs bronchitis vs viral syndrome. Chest x-ray negative for an acute cardiopulmonary process. No hypoxia, respiratory distress, or tachypnea. Patient denies any SOB or chest pain. Patient afebrile and non-toxic in appearance. Had improvements in condition with albuterol inhaler. Lungs clear to auscultation. Encouraged to stop smoking. Patient found to have elevated BP (166/114), but did not take her BP medications this morning. BP improved to 132/92. Encouraged to continue to take her BP as scheduled and follow-up with PCP if symptoms not improving or worsening. Return precautions, discharge instructions, and follow-up was discussed with the patient before discharge.     Discharge Medication List as of 03/05/2014 12:31 PM    START taking these medications   Details  guaiFENesin-dextromethorphan  (ROBITUSSIN DM) 100-10 MG/5ML syrup Take 5 mLs by mouth every 4 (four) hours as needed for cough., Starting 03/05/2014, Until Discontinued, Print         Final impressions: 1. Cough   2. Hypertension       Mercy Moore PA-C   I personally performed the services described in this documentation, which was scribed in my presence. The recorded information has been reviewed and is accurate.   Lucila Maine, PA-C 03/07/14 [phone]

## 2014-03-09 NOTE — ED Provider Notes (Signed)
History/physical exam/procedure(s) were performed by non-physician practitioner and as supervising physician I was immediately available for consultation/collaboration. I have reviewed all notes and am in agreement with care and plan.   Shaune Pollack, MD 03/09/14 1900

## 2014-04-29 ENCOUNTER — Encounter (HOSPITAL_COMMUNITY): Payer: Self-pay | Admitting: Emergency Medicine

## 2014-04-29 ENCOUNTER — Emergency Department (HOSPITAL_COMMUNITY)
Admission: EM | Admit: 2014-04-29 | Discharge: 2014-04-29 | Disposition: A | Discharge status: Home / Self Care | Payer: Medicaid Other | Attending: Emergency Medicine | Admitting: Emergency Medicine

## 2014-04-29 DIAGNOSIS — Z87891 Personal history of nicotine dependence: Secondary | ICD-10-CM | POA: Insufficient documentation

## 2014-04-29 DIAGNOSIS — I10 Essential (primary) hypertension: Secondary | ICD-10-CM | POA: Insufficient documentation

## 2014-04-29 DIAGNOSIS — Z79899 Other long term (current) drug therapy: Secondary | ICD-10-CM | POA: Insufficient documentation

## 2014-04-29 DIAGNOSIS — Z8742 Personal history of other diseases of the female genital tract: Secondary | ICD-10-CM | POA: Insufficient documentation

## 2014-04-29 DIAGNOSIS — Z8619 Personal history of other infectious and parasitic diseases: Secondary | ICD-10-CM | POA: Insufficient documentation

## 2014-04-29 DIAGNOSIS — E119 Type 2 diabetes mellitus without complications: Secondary | ICD-10-CM | POA: Insufficient documentation

## 2014-04-29 DIAGNOSIS — J069 Acute upper respiratory infection, unspecified: Secondary | ICD-10-CM

## 2014-04-29 DIAGNOSIS — Z880 Allergy status to penicillin: Secondary | ICD-10-CM | POA: Insufficient documentation

## 2014-04-29 MED ORDER — FLUTICASONE PROPIONATE 50 MCG/ACT NA SUSP: 1.0000 | Freq: Every day | NASAL | Status: DC

## 2014-04-29 NOTE — ED Notes (Signed)
Onset 1 week nasal congestion, green mucus; bilateral ear pain, no drainage; productive cough, lime green phlegm.  No other s/s noted.

## 2014-04-29 NOTE — ED Provider Notes (Signed)
CSN: [id number]     Arrival date & time 04/29/14  2013 History   First MD Initiated Contact with Patient 04/29/14 2022     This chart was scribed for non-physician practitioner, Vernie Murders, PA-C working with Osvaldo Shipper, MD by Forrestine Him, ED Scribe. This patient was seen in room TR05C/TR05C and the patient's care was started at 8:34 PM.   Chief Complaint  Patient presents with  . Otalgia   HPI  HPI Comments: Meredith York is a 38 y.o. Female with a PMHx of HTN, gestational DM, and migraines who presents to the Emergency Department complaining of ongoing, constant, moderate bilateral otalgia with associated "popping" and pressure x 1 week that is unchanged. She states her otalgia is exacerbated with sneezing. No tinnitus, ear drainage, or hearing loss. Pt has also noted green discharge from the nose, nasal congestion, sinus pressure, and a mild productive cough. She has tried OTC Benadryl, Claritin, and Robitussin without any noticeable improvement. At this time she denies any fever, fatigue, headache, chills, nausea, vomiting, diarrhea, abdominal pain, chest pain, SOB or weakness. Pt has been eating and drinking as normal.  She is otherwise healthy with no other pertinent past medical history. No other concerns this visit.    Past Medical History  Diagnosis Date  . Hypertension   . Diabetes in pregnancy   . Migraines   . Ovarian cyst   . Chlamydia   . Trichomonas    Past Surgical History  Procedure Laterality Date  . Cholecystectomy  02/2009  . Cesarean section  12/27/2011    Procedure: CESAREAN SECTION;  Surgeon: Shelly Bombard, MD;  Location: [facility];  Service: Gynecology;  Laterality: N/A;  Primary cesarean section with delivery of baby girl at 45. Apgars 3/3/6   Family History  Problem Relation Age of Onset  . Hypertension Maternal Aunt   . Hypertension Paternal Aunt   . Anesthesia problems Neg Hx   . Hypotension Neg Hx   . Malignant hyperthermia Neg Hx   .  Pseudochol deficiency Neg Hx    History  Substance Use Topics  . Smoking status: Former Smoker -- 0.00 packs/day for 16 years    Types: Cigarettes    Quit date: 05/12/2011  . Smokeless tobacco: Never Used  . Alcohol Use: 1.2 oz/week    1 Glasses of wine, 1 Shots of liquor per week     Comment: Occasional, Quit with (+) UPT   OB History   Grav Para Term Preterm Abortions TAB SAB Ect Mult Living   4 3 3  1 1    2       Review of Systems  Constitutional: Negative for fever, chills, activity change, appetite change and fatigue.  HENT: Positive for congestion, ear pain, rhinorrhea and sinus pressure. Negative for ear discharge, hearing loss, sore throat and trouble swallowing.   Respiratory: Positive for cough. Negative for shortness of breath and wheezing.   Cardiovascular: Negative for chest pain and leg swelling.  Gastrointestinal: Negative for nausea, vomiting, abdominal pain and diarrhea.  Musculoskeletal: Negative for back pain, myalgias and neck pain.  Skin: Negative for rash.  Neurological: Negative for weakness and headaches.  Psychiatric/Behavioral: Negative for confusion.    Allergies  Amoxicillin  Home Medications   Prior to Admission medications   Medication Sig Start Date End Date Taking? Authorizing Provider  acetaminophen (TYLENOL) 500 MG tablet Take 1,000 mg by mouth every 6 (six) hours as needed for pain.    Historical Provider, MD  amLODipine (NORVASC) 10 MG tablet Take 10 mg by mouth daily.    Historical Provider, MD  diphenhydrAMINE (BENADRYL) 25 MG tablet Take 25 mg by mouth every 6 (six) hours as needed for allergies.    Historical Provider, MD  escitalopram (LEXAPRO) 10 MG tablet Take 10 mg by mouth daily.    Historical Provider, MD  GuaiFENesin (MUCINEX PO) Take 1 tablet by mouth every 4 (four) hours as needed (for congestion).    Historical Provider, MD  guaiFENesin (ROBITUSSIN) 100 MG/5ML SOLN Take 15 mLs by mouth every 4 (four) hours as needed for cough  or to loosen phlegm.    Historical Provider, MD  guaiFENesin-dextromethorphan (ROBITUSSIN DM) 100-10 MG/5ML syrup Take 5 mLs by mouth every 4 (four) hours as needed for cough. 03/05/14   Lucila Maine, PA-C  metFORMIN (GLUCOPHAGE) 500 MG tablet Take 500 mg by mouth 2 (two) times daily with a meal.    Historical Provider, MD   Triage Vitals: BP 135/91  Pulse 77  Temp(Src) 98.7 F (37.1 C) (Oral)  Resp 18  Ht 5\' 2"  (1.575 m)  Wt 214 lb (97.07 kg)  BMI 39.13 kg/m2  SpO2 100%  LMP 04/23/2014   Filed Vitals:   04/29/14 2017 04/29/14 2043  BP: 135/91 131/82  Pulse: 77 74  Temp: 98.7 F (37.1 C) 98.5 F (36.9 C)  TempSrc: Oral Oral  Resp: 18 16  Height: 5\' 2"  (1.575 m)   Weight: 214 lb (97.07 kg)   SpO2: 100% 100%    Physical Exam  Nursing note and vitals reviewed. Constitutional: She is oriented to person, place, and time. She appears well-developed and well-nourished. No distress.  Non-toxic  HENT:  Head: Normocephalic and atraumatic.  Right Ear: External ear normal.  Left Ear: External ear normal.  Nose: Nose normal.  Mouth/Throat: Oropharynx is clear and moist. No oropharyngeal exudate.  Nasal congestion. No sinus tenderness to palpation. Tympanic membranes gray and translucent bilaterally with no erythema, edema, or hemotympanum.  No mastoid or tragal tenderness bilaterally. No erythema to the posterior pharynx. Tonsils without edema or exudates. Uvula midline. No trismus. No difficulty controlling secretions.   Eyes: Conjunctivae and EOM are normal. Pupils are equal, round, and reactive to light. Right eye exhibits no discharge. Left eye exhibits no discharge.  Neck: Normal range of motion. Neck supple.  No cervical lymphadenopathy. No nuchal rigidity.   Cardiovascular: Normal rate, regular rhythm, normal heart sounds and intact distal pulses.  Exam reveals no gallop and no friction rub.   No murmur heard. Pulmonary/Chest: Effort normal and breath sounds normal. No  respiratory distress. She has no wheezes. She has no rales. She exhibits no tenderness.  Abdominal: Soft. She exhibits no distension. There is no tenderness.  Musculoskeletal: Normal range of motion. She exhibits no edema and no tenderness.  Neurological: She is alert and oriented to person, place, and time.  Skin: Skin is warm and dry. She is not diaphoretic.     ED Course  Procedures (including critical care time)  DIAGNOSTIC STUDIES: Oxygen Saturation is 100% on RA, Normal by my interpretation.    COORDINATION OF CARE: 8:27 PM-Discussed treatment plan with pt at bedside and pt agreed to plan.     Labs Review Labs Reviewed - No data to display  Imaging Review No results found.   EKG Interpretation None      MDM   Meredith York is a 38 y.o. female with a PMHx of HTN, gestational DM, and migraines who presents  to the Emergency Department complaining of ongoing, constant, moderate bilateral otalgia with associated "popping" and pressure x 1 week that is unchanged. Symptoms likely due to an URI vs allergic rhinitis. Will try Flonase. Doubt sinusitis at this time. Patient also has mild cough. Doubt pneumonia. No hypoxia, respiratory distress, or tachypnea. Vital signs stable. Patient afebrile and non-toxic in appearance. Instructed patient to follow-up with PCP if symptoms not improving or worsening. Return precautions, discharge instructions, and follow-up was discussed with the patient before discharge.       Discharge Medication List as of 04/29/2014  8:46 PM    START taking these medications   Details  fluticasone (FLONASE) 50 MCG/ACT nasal spray Place 1 spray into both nostrils daily., Starting 04/29/2014, Until Discontinued, Print        Final impressions: 1. URI (upper respiratory infection)       Denman George   I personally performed the services described in this documentation, which was scribed in my presence. The recorded information has been  reviewed and is accurate.    Lucila Maine, PA-C 04/30/14 [phone]

## 2014-04-29 NOTE — Discharge Instructions (Signed)
Drink plenty of fluids and rest  Return to the emergency department if you develop any changing/worsening condition, fever, or any other concerns (please read additional information regarding your condition below)    Upper Respiratory Infection, Adult An upper respiratory infection (URI) is also known as the common cold. It is often caused by a type of germ (virus). Colds are easily spread (contagious). You can pass it to others by kissing, coughing, sneezing, or drinking out of the same glass. Usually, you get better in 1 or 2 weeks.  HOME CARE   Only take medicine as told by your doctor.  Use a warm mist humidifier or breathe in steam from a hot shower.  Drink enough water and fluids to keep your pee (urine) clear or pale yellow.  Get plenty of rest.  Return to work when your temperature is back to normal or as told by your doctor. You may use a face mask and wash your hands to stop your cold from spreading. GET HELP RIGHT AWAY IF:   After the first few days, you feel you are getting worse.  You have questions about your medicine.  You have chills, shortness of breath, or brown or red spit (mucus).  You have yellow or brown snot (nasal discharge) or pain in the face, especially when you bend forward.  You have a fever, puffy (swollen) neck, pain when you swallow, or white spots in the back of your throat.  You have a bad headache, ear pain, sinus pain, or chest pain.  You have a high-pitched whistling sound when you breathe in and out (wheezing).  You have a lasting cough or cough up blood.  You have sore muscles or a stiff neck. MAKE SURE YOU:   Understand these instructions.  Will watch your condition.  Will get help right away if you are not doing well or get worse. Document Released: 05/25/2008 Document Revised: 02/29/2012 Document Reviewed: 04/13/2011 [facility] Patient Information 2014 [place], [place].   Emergency Department Resource Guide 1) Find a Doctor and  Pay Out of Pocket Although you won't have to find out who is covered by your insurance plan, it is a good idea to ask around and get recommendations. You will then need to call the office and see if the doctor you have chosen will accept you as a new patient and what types of options they offer for patients who are self-pay. Some doctors offer discounts or will set up payment plans for their patients who do not have insurance, but you will need to ask so you aren't surprised when you get to your appointment.  2) Contact Your Local Health Department Not all health departments have doctors that can see patients for sick visits, but many do, so it is worth a call to see if yours does. If you don't know where your local health department is, you can check in your phone book. The CDC also has a tool to help you locate your state's health department, and many state websites also have listings of all of their local health departments.  3) Find a [facility] If your illness is not likely to be very severe or complicated, you may want to try a walk in clinic. These are popping up all over the country in pharmacies, drugstores, and shopping centers. They're usually staffed by nurse practitioners or physician assistants that have been trained to treat common illnesses and complaints. They're usually fairly quick and inexpensive. However, if you have serious medical issues  or chronic medical problems, these are probably not your best option.  No Primary Care Doctor: - Call Health Connect at  [phone] - they can help you locate a primary care doctor that  accepts your insurance, provides certain services, etc. - Physician Referral Service- [phone]  Chronic Pain Problems: Organization         Address  Phone   Notes  [facility]  [phone] Patients need to be referred by their primary care doctor.   Medication Assistance: Organization         Address  Phone   Notes  [facility] Medication [facility] [address] [place], [place] [address] [phone] --Must be a resident of [facility] -- Must have NO insurance coverage whatsoever (no Medicaid/ Medicare, etc.) -- The pt. MUST have a primary care doctor that directs their care regularly and follows them in the community   MedAssist  [phone]   [employer]  [phone]    Agencies that provide inexpensive medical care: Organization         Address  Phone   Notes  [place]  [phone]   [facility]    [phone]   [facility] [place], [place] [address] [phone]   [facility] [address], [place] [phone]   Planned Parenthood    [phone]   [facility]    [phone]   [place] and [place] [address], [place] Phone:  [phone], Fax:  [phone] Hours of Operation:  9 am - 6 pm, M-F.  Also accepts Medicaid/Medicare and self-pay.  [facility] for [place], [address], [place] Phone: [phone], Fax: [phone]. Hours of Operation:  8:30 am - 5:30 pm, M-F.  Also accepts Medicaid and self-pay.  [facility] [place] [address] [place] Phone: [phone]   [place], [place], [place] [phone], Ext. [phone] Mondays & Thursdays: 7-9 AM.  First 15 patients are seen on a first come, first serve basis.    [place] Providers:  Organization         Address  Phone   Notes  [facility] [address], [place] [phone] Also accepts self-pay patients.  [facility] [address], [place]  [phone]   [place], [address], [place] [phone]   [facility] [address] [place] [phone]   Lucianne Lei [address], [place]   [phone] Only accepts [place] Access [place] patients after they have their name applied to their card.   Self-Pay (no insurance) in [facility]:  Organization         Address  Phone   Notes  Sickle Cell Patients, [facility] [place] [phone]   [facility] [place] [phone]   [facility] [place]  [place], [place], [place] [phone]   [facility]/Dr. Osei-Bonsu  [address] [place] or [address], [place] [phone] Phone number for both [place] and [place] locations is the same.  [facility]gent [facility] and [facility] [address] [place] [phone]   [facility] [address], [place] or [address] [phone]   [facility] [address], [place] [phone], phone; [phone], fax Sees patients 1st and 3rd Saturday of every month.  Must not qualify for public or private insurance (i.e. Medicaid, Medicare, [place] Health Choice, Veterans' Benefits)  Household income should be no more than 200% of the poverty level The clinic cannot treat you if you are pregnant or think you are pregnant  Sexually transmitted diseases are not treated at the clinic.    Dental Care: Organization         Address  Phone  Notes  [place] Department of [facility] [phone] Accepts children up to age 5 who are enrolled in [place] or [place]; pregnant women with a Medicaid card; and children who have applied for Medicaid or [place] Health Choice, but were declined, whose parents can pay a reduced fee at time of service.  [facility] Department of [facility]  [address], [place] [phone] Accepts children up to age 43 who are enrolled in [place] or [place]; pregnant women with a Medicaid card; and children who have applied for Medicaid or [place] Health Choice, but were declined, whose parents can pay a reduced fee at time of service.  [facility] Adult [facility] Access PROGRAM  [place] [phone] Patients are seen by appointment only. Walk-ins are not accepted. [place] will see patients 62 years of age and older. Monday - Tuesday (8am-5pm) Most Wednesdays (8:30-5pm) $30 per visit, cash only  [facility] Adult Dental Access PROGRAM  [address], [facility] [phone] Patients are seen by appointment only. Walk-ins are not accepted. [place] will see patients 56 years of age and older. One Wednesday Evening (Monthly: Volunteer Based).  $30 per visit, cash only  [place]  [phone] for adults; Children under age 5, call Graduate Pediatric Dentistry at [phone]. Children aged 26-14, please call [phone] to request a pediatric application.  Dental services are provided in all areas of dental care including fillings, crowns and bridges, complete and partial dentures, implants, gum treatment, root canals, and extractions. Preventive care is also provided. Treatment is provided to both adults and children. Patients are selected via a lottery and there is often a waiting list.   [facility] [address], [place]  [phone] [email or web address]   [facility] [address] [place], [place] [phone], Ext. [phone] Second and Fourth Thursday of each month, opens at 6:30 AM; Clinic ends at 9 AM.  Patients are seen on a first-come first-served basis, and a limited number are seen during each clinic.   [facility]  [address] Hillard Danker [place], [place] [phone]   Eligibility Requirements You must have lived in [place], [place], or  [place] counties for at least the last three months.   You cannot be eligible for state or federal sponsored [employer], including [employer], [place], or [employer].   You generally cannot be eligible for healthcare insurance through your employer.    How to apply: Eligibility screenings are held every Tuesday and Wednesday afternoon from 1:00 pm until 4:00 pm. You do not need an appointment for the interview!  [facility] [address] [place], [place]   [place]  [place] Department  [place]  [phone]    Behavioral Health Resources in the Community: Intensive Outpatient Programs Organization         Address  Phone  Notes  [place]. [address], [place], [place] [phone]   [facility] Outpatient [address] [place], [place]   ADS: Alcohol & Drug Sv[facility] [address], [place], [place]   [place] [address],  [place], [place] or [phone]   Substance Abuse Resources Organization         Address  Phone  Notes  Alcohol and Drug Services  [phone]   [place]  [phone]   [facility] [place]   Chinita Pester  [phone]   Residential & Outpatient Substance Abuse Program  [phone]   Psychological Services Organization         Address  Phone  Notes  [facility] [place]  [place]  [phone]   [place] [address] [place] or [phone]    Mobile Crisis Teams Organization         Address  Phone  Notes  [facility], Mobile Crisis Care Unit  [phone]   [facility]  [address] [place], [place]   [place] [address], [place] [phone]    Self-Help/Support Groups Organization         Address  Phone             Notes  [place]. of [place] - variety of support groups  [place] Call for more information  Narcotics Anonymous (NA), [facility] [address], [employer]   2 meetings at this location   Special educational needs teacher         Address  Phone  Notes  [facility] [place],    [place]  [phone]   [facility]  [address] [place] [address], [place], [place]   [place], [place] [phone] Admissions: 8am-3pm M-F  Incentives Substance [place] [address]    [place], [place] [phone]   [facility] [address] [place], [place], [place]   [facility] [address],  [place], [place]   [facility] Programs - Intensive Outpatient [address] Kristeen [address], [place], [place]   [facility] ([place].,  [place], [place] [phone] or [phone]   [facility]idential Treatment Services (RTS) [address] [place], [place] Accepts Medicaid  [facility] [address]  [place] [phone] Substance Abuse/Addiction Treatment   [facility] Organization         Address  Phone  Notes  [facility]  [phone]   Domenic Schwab, PhD [address] Arlis Porta [place], [place]   [phone] or [phone]   [place], [place] [phone]   [place] [address], [place], [place] [phone] Insurance/Medicaid/sponsorship through [employer] and Families [address], [address]  [place], [place] [phone] Therapy/tele-psych/case  °[facility] [address]  ° [place], [place] [phone]     °Dr. Arfeen  [phone]   °[facility] [place]  [employer] [place] [facility] 1) [address], [place] °2) [address], [place] °3)  [address], [place] [phone] °([phone] ° °([phone]   °[place] Child Abuse Hotline (336) 342-1394 or (336) [phone] (After Hours)    ° ° ° °

## 2014-04-29 NOTE — ED Notes (Signed)
The pt has had bi-lateral ear pain for one week with a green drainage from the nose.  Her ears have been popping also

## 2014-05-03 NOTE — ED Provider Notes (Signed)
Medical screening examination/treatment/procedure(s) were performed by non-physician practitioner and as supervising physician I was immediately available for consultation/collaboration.   EKG Interpretation None        William Kamilia Carollo, MD 05/03/14 1619 

## 2014-06-21 ENCOUNTER — Inpatient Hospital Stay (HOSPITAL_COMMUNITY)
Admission: AD | Admit: 2014-06-21 | Discharge: 2014-06-21 | Disposition: A | Discharge status: Home / Self Care | Payer: Medicaid Other | Source: Ambulatory Visit | Attending: Obstetrics & Gynecology | Admitting: Obstetrics & Gynecology

## 2014-06-21 ENCOUNTER — Inpatient Hospital Stay (HOSPITAL_COMMUNITY): Payer: Medicaid Other

## 2014-06-21 ENCOUNTER — Telehealth (HOSPITAL_COMMUNITY): Payer: Self-pay | Admitting: Family Medicine

## 2014-06-21 ENCOUNTER — Encounter (HOSPITAL_COMMUNITY): Payer: Self-pay | Admitting: *Deleted

## 2014-06-21 DIAGNOSIS — O021 Missed abortion: Secondary | ICD-10-CM | POA: Insufficient documentation

## 2014-06-21 DIAGNOSIS — Z87891 Personal history of nicotine dependence: Secondary | ICD-10-CM | POA: Insufficient documentation

## 2014-06-21 DIAGNOSIS — R1031 Right lower quadrant pain: Secondary | ICD-10-CM | POA: Insufficient documentation

## 2014-06-21 DIAGNOSIS — IMO0002 Reserved for concepts with insufficient information to code with codable children: Secondary | ICD-10-CM | POA: Insufficient documentation

## 2014-06-21 LAB — URINALYSIS, ROUTINE W REFLEX MICROSCOPIC
BILIRUBIN URINE: NEGATIVE
GLUCOSE, UA: NEGATIVE mg/dL
KETONES UR: 15 mg/dL — AB
Nitrite: NEGATIVE
PH: 6 (ref 5.0–8.0)
Protein, ur: NEGATIVE mg/dL
SPECIFIC GRAVITY, URINE: 1.025 (ref 1.005–1.030)
Urobilinogen, UA: 0.2 mg/dL (ref 0.0–1.0)

## 2014-06-21 LAB — CBC
HCT: 39.6 % (ref 36.0–46.0)
HEMOGLOBIN: 13.2 g/dL (ref 12.0–15.0)
MCH: 29.7 pg (ref 26.0–34.0)
MCHC: 33.3 g/dL (ref 30.0–36.0)
MCV: 89 fL (ref 78.0–100.0)
Platelets: 307 10*3/uL (ref 150–400)
RBC: 4.45 MIL/uL (ref 3.87–5.11)
RDW: 14.5 % (ref 11.5–15.5)
WBC: 11.3 10*3/uL — ABNORMAL HIGH (ref 4.0–10.5)

## 2014-06-21 LAB — URINE MICROSCOPIC-ADD ON

## 2014-06-21 LAB — WET PREP, GENITAL
TRICH WET PREP: NONE SEEN
YEAST WET PREP: NONE SEEN

## 2014-06-21 LAB — HIV ANTIBODY (ROUTINE TESTING W REFLEX): HIV: NONREACTIVE

## 2014-06-21 LAB — POCT PREGNANCY, URINE: Preg Test, Ur: POSITIVE — AB

## 2014-06-21 LAB — HCG, QUANTITATIVE, PREGNANCY: HCG, BETA CHAIN, QUANT, S: 63217 m[IU]/mL — AB (ref <5)

## 2014-06-21 MED ORDER — OXYCODONE-ACETAMINOPHEN 5-325 MG PO TABS
1.0000 | ORAL_TABLET | Freq: Once | ORAL | Status: AC
  Administered 2014-06-21: 1 via ORAL
  Filled 2014-06-21: qty 1

## 2014-06-21 MED ORDER — PROMETHAZINE HCL 25 MG PO TABS
25.0000 mg | ORAL_TABLET | Freq: Once | ORAL | Status: AC
  Administered 2014-06-21: 25 mg via ORAL
  Filled 2014-06-21: qty 1

## 2014-06-21 MED ORDER — OXYCODONE-ACETAMINOPHEN 5-325 MG PO TABS: 2.0000 | ORAL_TABLET | ORAL | Status: DC | PRN

## 2014-06-21 MED ORDER — NITROFURANTOIN MONOHYD MACRO 100 MG PO CAPS: 100.0000 mg | ORAL_CAPSULE | Freq: Two times a day (BID) | ORAL | Status: DC

## 2014-06-21 MED ORDER — PROMETHAZINE HCL 25 MG PO TABS: 25.0000 mg | ORAL_TABLET | Freq: Four times a day (QID) | ORAL | Status: DC | PRN

## 2014-06-21 NOTE — Telephone Encounter (Signed)
Called patient on 06/21/14 @ 4:00p to notify her of surgery scheduled on 06/25/14 @ 1:00p.and to be at the main entrance of hospital no later than 11:30a that morning.  Patient understood and I explained she will be receiving another call from the pro-op nurse with further instructions.

## 2014-06-21 NOTE — MAU Provider Note (Signed)
History     CSN: [id number]  Arrival date and time: 06/21/14 1101   First Provider Initiated Contact with Patient 06/21/14 1146      Chief Complaint  Patient presents with  . Possible Pregnancy  . Emesis  . Abdominal Pain   HPI Comments: Meredith York 38 y.o. [id number] [redacted]w[redacted]d presents to MAU with right lower quad pain ongoing for one week that got worse last night. She vomited and is nauseated now. It is 10 on 1-10 scale. She denies any vaginal bleeding but has vaginal discharge. No new partner. Hx of DM and HTN  Possible Pregnancy Associated symptoms include abdominal pain and vomiting.  Emesis  Associated symptoms include abdominal pain.  Abdominal Pain Associated symptoms include vomiting.      Past Medical History  Diagnosis Date  . Hypertension   . Diabetes in pregnancy   . Migraines   . Ovarian cyst   . Chlamydia   . Trichomonas     Past Surgical History  Procedure Laterality Date  . Cholecystectomy  02/2009  . Cesarean section  12/27/2011    Procedure: CESAREAN SECTION;  Surgeon: Shelly Bombard, MD;  Location: [facility];  Service: Gynecology;  Laterality: N/A;  Primary cesarean section with delivery of baby girl at 29. Apgars 3/3/6    Family History  Problem Relation Age of Onset  . Hypertension Maternal Aunt   . Hypertension Paternal Aunt   . Anesthesia problems Neg Hx   . Hypotension Neg Hx   . Malignant hyperthermia Neg Hx   . Pseudochol deficiency Neg Hx     History  Substance Use Topics  . Smoking status: Former Smoker -- 0.00 packs/day for 16 years    Types: Cigarettes    Quit date: 05/12/2011  . Smokeless tobacco: Never Used  . Alcohol Use: No    Allergies:  Allergies  Allergen Reactions  . Amoxicillin Hives and Itching    Prescriptions prior to admission  Medication Sig Dispense Refill  . acetaminophen (TYLENOL) 500 MG tablet Take 1,000 mg by mouth every 6 (six) hours as needed for pain.      Marland Kitchen amLODipine (NORVASC) 10 MG tablet Take 10  mg by mouth daily.      . diphenhydrAMINE (BENADRYL) 25 MG tablet Take 25 mg by mouth every 6 (six) hours as needed for allergies.      Marland Kitchen escitalopram (LEXAPRO) 10 MG tablet Take 10 mg by mouth daily.      . fluticasone (FLONASE) 50 MCG/ACT nasal spray Place 1 spray into both nostrils daily.  16 g  0  . GuaiFENesin (MUCINEX PO) Take 1 tablet by mouth every 4 (four) hours as needed (for congestion).      Marland Kitchen guaiFENesin (ROBITUSSIN) 100 MG/5ML SOLN Take 15 mLs by mouth every 4 (four) hours as needed for cough or to loosen phlegm.      Marland Kitchen guaiFENesin-dextromethorphan (ROBITUSSIN DM) 100-10 MG/5ML syrup Take 5 mLs by mouth every 4 (four) hours as needed for cough.  118 mL  0  . metFORMIN (GLUCOPHAGE) 500 MG tablet Take 500 mg by mouth 2 (two) times daily with a meal.        Review of Systems  Constitutional: Negative.   HENT: Negative.   Eyes: Negative.   Respiratory: Negative.   Cardiovascular: Negative.   Gastrointestinal: Positive for vomiting and abdominal pain.  Genitourinary: Negative.   Musculoskeletal: Negative.   Skin: Negative.   Neurological: Negative.   Endo/Heme/Allergies: Negative.   Psychiatric/Behavioral: Negative.  Physical Exam   Blood pressure 152/93, pulse 98, resp. rate 18, height 5' 2.5" (1.588 m), weight 98.612 kg (217 lb 6.4 oz), last menstrual period 04/23/2014, SpO2 100.00%.  Physical Exam  Constitutional: She is oriented to person, place, and time. She appears well-developed and well-nourished. No distress.  HENT:  Head: Normocephalic and atraumatic.  Eyes: Pupils are equal, round, and reactive to light.  GI: Soft. Bowel sounds are normal. She exhibits no mass. There is tenderness. There is no rebound and no guarding.  RLQ  Genitourinary:  Genital:External negative Vaginal:small amount white vaginal discharge Cervix:closed/ thick Bimanual:right adnexal tenderness   Musculoskeletal: Normal range of motion.  Neurological: She is alert and oriented to  person, place, and time.  Skin: Skin is warm and dry.  Psychiatric: She has a normal mood and affect. Her behavior is normal. Judgment and thought content normal.   Results for orders placed during the hospital encounter of 06/21/14 (from the past 24 hour(s))  URINALYSIS, ROUTINE W REFLEX MICROSCOPIC     Status: Abnormal   Collection Time    06/21/14 11:20 AM      Result Value Ref Range   Color, Urine YELLOW  YELLOW   APPearance CLOUDY (*) CLEAR   Specific Gravity, Urine 1.025  1.005 - 1.030   pH 6.0  5.0 - 8.0   Glucose, UA NEGATIVE  NEGATIVE mg/dL   Hgb urine dipstick SMALL (*) NEGATIVE   Bilirubin Urine NEGATIVE  NEGATIVE   Ketones, ur 15 (*) NEGATIVE mg/dL   Protein, ur NEGATIVE  NEGATIVE mg/dL   Urobilinogen, UA 0.2  0.0 - 1.0 mg/dL   Nitrite NEGATIVE  NEGATIVE   Leukocytes, UA SMALL (*) NEGATIVE  URINE MICROSCOPIC-ADD ON     Status: Abnormal   Collection Time    06/21/14 11:20 AM      Result Value Ref Range   Squamous Epithelial / LPF FEW (*) RARE   WBC, UA 21-50  <3 WBC/hpf   Bacteria, UA FEW (*) RARE   Urine-Other MUCOUS PRESENT    POCT PREGNANCY, URINE     Status: Abnormal   Collection Time    06/21/14 11:27 AM      Result Value Ref Range   Preg Test, Ur POSITIVE (*) NEGATIVE  WET PREP, GENITAL     Status: Abnormal   Collection Time    06/21/14 11:55 AM      Result Value Ref Range   Yeast Wet Prep HPF POC NONE SEEN  NONE SEEN   Trich, Wet Prep NONE SEEN  NONE SEEN   Clue Cells Wet Prep HPF POC FEW (*) NONE SEEN   WBC, Wet Prep HPF POC FEW (*) NONE SEEN  CBC     Status: Abnormal   Collection Time    06/21/14 12:15 PM      Result Value Ref Range   WBC 11.3 (*) 4.0 - 10.5 K/uL   RBC 4.45  3.87 - 5.11 MIL/uL   Hemoglobin 13.2  12.0 - 15.0 g/dL   HCT 39.6  36.0 - 46.0 %   MCV 89.0  78.0 - 100.0 fL   MCH 29.7  26.0 - 34.0 pg   MCHC 33.3  30.0 - 36.0 g/dL   RDW 14.5  11.5 - 15.5 %   Platelets 307  150 - 400 K/uL  HCG, QUANTITATIVE, PREGNANCY     Status:  Abnormal   Collection Time    06/21/14 12:15 PM      Result Value Ref Range  hCG, Beta [name], [place] [address] (*) <5 mIU/mL   Blood Type is O+   US Ob Comp Less 14 Wks  06/21/2014   CLINICAL DATA:  RLQ pain x1week,worse last night,vomiting,Early pregnant; RLQ pain, Vomiting  EXAM: OBSTETRIC <14 WK [place] AND TRANSVAGINAL OB US  TECHNIQUE: Both transabdominal and transvaginal ultrasound examinations were performed for complete evaluation of the gestation as well as the maternal uterus, adnexal regions, and pelvic cul-de-sac. Transvaginal technique was performed to assess early pregnancy.  COMPARISON:  None.  FINDINGS: Intrauterine gestational sac: Visualized/normal in shape.  Yolk sac:  Visualize  Embryo:  Visualized.  Cardiac Activity: Not appreciated.  Heart Rate:  Not applicable  CRL:   16.1  mm   8 w 1 d                  [place] EDC: 01/30/2014  Moderate sized subchorionic hemorrhage measuring 2.2 x 1.5 x 3.8 cm.  Maternal uterus/adnexae: Unremarkable  IMPRESSION: No appreciable cardiac activity. These findings are consistent with a nonviable intrauterine pregnancy. Correlation with serial beta HCG and ultrasound as clinically warranted recommended. These results were called by telephone at the time of interpretation on 06/21/2014 at 1:36 PM to Dr. Emeterio Reeve, who verbally acknowledged these results.  Subchorionic hemorrhage   Electronically Signed   By: Margaree Mackintosh M.D.   On: 06/21/2014 13:37   US Ob Transvaginal  06/21/2014   CLINICAL DATA:  RLQ pain x1week,worse last night,vomiting,Early pregnant; RLQ pain, Vomiting  EXAM: OBSTETRIC <14 WK [place] AND TRANSVAGINAL OB US  TECHNIQUE: Both transabdominal and transvaginal ultrasound examinations were performed for complete evaluation of the gestation as well as the maternal uterus, adnexal regions, and pelvic cul-de-sac. Transvaginal technique was performed to assess early pregnancy.  COMPARISON:  None.  FINDINGS: Intrauterine gestational sac: Visualized/normal in  shape.  Yolk sac:  Visualize  Embryo:  Visualized.  Cardiac Activity: Not appreciated.  Heart Rate:  Not applicable  CRL:   09.6  mm   8 w 1 d                  [place] EDC: 01/30/2014  Moderate sized subchorionic hemorrhage measuring 2.2 x 1.5 x 3.8 cm.  Maternal uterus/adnexae: Unremarkable  IMPRESSION: No appreciable cardiac activity. These findings are consistent with a nonviable intrauterine pregnancy. Correlation with serial beta HCG and ultrasound as clinically warranted recommended. These results were called by telephone at the time of interpretation on 06/21/2014 at 1:36 PM to Dr. Emeterio Reeve, who verbally acknowledged these results.  Subchorionic hemorrhage   Electronically Signed   By: Margaree Mackintosh M.D.   On: 06/21/2014 13:37    MAU Course  Procedures  MDM Wet prep, GC, Chlamydia, CBC, UA, U/S, Quant/ reviewed with patient Percocet/ phenergan for pain and nausea Dr Roselie Awkward will discuss D&C with patient which is her option  Assessment and Plan   A: Fetal Demise UTI  P: Dr Roselie Awkward will schedule for surgery Macrobid 100 mg BID x 7 days Increase fluids and rest Percocet/ phenergan as needed Return to MAU as needed  Georgia Duff 06/21/2014, 12:01 PM

## 2014-06-21 NOTE — MAU Note (Signed)
Patient states she has been having mild right lower abdominal pain all week, became worse last night and started vomiting. States she had 3 pregnancy tests at home, 2 were negative and one positive. Denies vaginal bleeding but has a slimy mucus vaginal discharge.

## 2014-06-21 NOTE — Discharge Instructions (Signed)
Intrauterine Fetal Demise °About one percent of normal, uncomplicated pregnancies end in fetal death (intrauterine fetal demise, IUFD). It is considered a fetal death when it occurs after the 20th week of pregnancy. It is considered a miscarriage when a fetal death occurs in the first 20 weeks. The mother's health is usually not in danger. Usually, there is nothing that can be done to prevent it. °CAUSES °· Often the cause is unknown. °· Examination of the stillborn fetus after delivery may show an abnormality in the umbilical cord. An exam my also show a problem with the placenta or fetus. These problems may include infections or a variety of birth defects and genetic disorders. °· The pregnancy continues for 42 weeks or later (post term pregnancy). °· Conditions in the mother such as diabetes, high blood pressure, and numerous other medical, physical or poor lifestyle choices (illegal drugs, alcohol, smoking) increase the risk for fetal death. Often, however, risk factors are unknown. °· Multiple pregnancies (twins or more) increase the risk of fetal death. °SYMPTOMS  °· The mother may not notice symptoms in the early stages of pregnancy. Learning what is wrong (diagnosis) is based on: °¨ The loss of baby's heart sounds. °¨ The lack of increasing belly (abdominal) growth. °¨ Ultrasound studies which suggest death of the fetus. °· In later stages of pregnancy, a woman may be aware of changes in the fetal movement (kicks), or that the movement has stopped. °RELATED COMPLICATIONS °· Disseminated intravascular coagulation (DIC) is a problem with blood clotting. This can result in severe bleeding and rarely develops late after fetal death. °· Infection of the products of the pregnancy (fetal materials). °· Increase bleeding from retained fetal parts or placenta. °TREATMENT  °· Treatment should be accomplished within 2 weeks of the discovered fetal death. °· To confirm the fetal death, diagnostic tests are done such  as: °¨ X-rays. °¨ Ultrasound. °¨ Amniotic fluid studies (looking at the fluid in the sac surrounding the baby). °· Most women, on learning that their fetus is dead, prefer early removal of the contents of the womb (uterus). In the first three months of pregnancy (first trimester), this is usually done by D and C or with suction curettage. Suction curettage is a technique used to remove the dead fetus and other tissue of the pregnancy from the uterus. It uses an instrument somewhat like a straw, connected by tubing to a machine, that your caregiver uses to suck out the dead contents of the uterus. NOTE: Suction curettage may be done in the second and third trimester after delivery of the dead fetus only to make sure there is no placental tissue left in the uterus but not to suction out the fetus. °· In the second trimester, treatment is more frequently accomplished with high doses of a drug, prostaglandin E (Prostin) suppositories or in combination with laminaria (as specialized seaweed product that absorbs moisture and expands to gradually stretch and open the cervix). Prostin (T) causes labor to start. °· In the third trimester, it may be accomplished with laminaria and misoprostol vaginal suppositories to induce labor. It may also be done with the drugs intravenous oxytocin plus prostaglandin E. °· If there was an infection involved with the fetal death, you will be given an antibiotic. You will be given Rho-gam if you are Rh negative and the baby is Rh positive (a vaccine to prevent Rh problems with a future pregnancy). An additional treatment option is to wait for spontaneous labor, which usually occurs within 2   weeks, but may be longer. This is called expectant therapy. °· Following removal of the products of the pregnancy, the stillborn fetus is usually examined by a specialist (pathologist) to determine if problems are present that may reoccur in another pregnancy. This can help plan future pregnancies. That  planning will also include treatment which will best guarantee a good outcome in future pregnancies. °· Your caregiver can also help you deal with feelings of loss, guilt, loneliness, anxiety, and hostility. Family and friends can be helpful. If severe grief lasts longer than several months, professional counseling may be helpful. Joining a grief support group may be useful. °· Any medicines prescribed will depend on the type of treatment received. °Other problems can be cared for with your caregivers. There may be discussions on whether or not to see, touch or photograph the infant, whether to name the infant, what to do with the remains (burial or cremation), and holding religious services. °HOME CARE INSTRUCTIONS  °· Restrictions are usually not necessary unless associated with the delivery choice. °· Sexual intercourse should be avoided for 4 to 6 weeks. Starting another pregnancy should be delayed several months, or as suggested by your caregiver. °· Do not use tampons or douche. °· Only take over-the-counter or prescription medicines for pain, discomfort, or fever as directed by your caregiver. Do not take aspirin it can cause you to bleed. Call your caregiver for a prescription for stronger pain medication if you need it. °· No special diet is necessary unless you have diabetes or other medical problems that require a special diet. °· Take showers instead of baths until your caregiver tells you it is okay. °· Ask your caregiver when you can return to driving and to your everyday activities. °· Make an appointment with your caregiver for follow up care. °PREVENTION  °· Eliminate any of the causes, if possible, that were found after evaluating the fetus. °· Control any medical problems you may have before or during the pregnancy. °· Avoid illegal drugs, alcohol and smoking. °· Maintain good prenatal care and follow your caregiver's treatment and advice. °· Report any concerns or unusual changes you notice  during your pregnancy. °· More frequent prenatal visits may be necessary with the next child. °SEEK MEDICAL CARE IF:  °· You develop abnormal vaginal discharge. °· You develop a temperature 102° F (38.9° C) or higher. °· You are getting dizzy and faint. °· You are feeling depressed. °SEEK IMMEDIATE MEDICAL CARE IF:  °During pregnancy: °· You fail to gain weight, or your abdomen is not increasing in size. °· Your unborn child appears to have less movement or stopped moving. Keep your medical conditions under control. °After delivery: °· You have heavy vaginal bleeding. °· You have chills and fever. °· You have chest pain. °· You have shortness of breath. °· You have pain or swelling or redness of your leg. °· Following the death of a fetus, you or a family member need help or emotional support in coping with the grief process. °MAKE SURE YOU:  °· Understand these instructions. °· Will watch your condition. °· Will get help right away if you are not doing well or get worse. °Document Released: 12/07/2005 Document Revised: 02/29/2012 Document Reviewed: 09/01/2007 °[employer]® Patient Information ©2015 [employer]. This information is not intended to replace advice given to you by your health care provider. Make sure you discuss any questions you have with your health care provider. ° °

## 2014-06-22 LAB — GC/CHLAMYDIA PROBE AMP
CT PROBE, AMP APTIMA: NEGATIVE
GC PROBE AMP APTIMA: NEGATIVE

## 2014-06-23 LAB — CULTURE, OB URINE
Colony Count: >=100000
Special Requests: NORMAL

## 2014-06-24 ENCOUNTER — Telehealth: Payer: Self-pay | Admitting: Advanced Practice Midwife

## 2014-06-24 ENCOUNTER — Other Ambulatory Visit: Payer: Self-pay | Admitting: Advanced Practice Midwife

## 2014-06-24 MED ORDER — SULFAMETHOXAZOLE-TMP DS 800-160 MG PO TABS: 1.0000 | ORAL_TABLET | Freq: Two times a day (BID) | ORAL | Status: DC

## 2014-06-24 NOTE — Telephone Encounter (Signed)
Notified pt of new Rx for Bactrim DS BID x 7 days.  Pt switched from Macrobid to Bactrim following urine culture results.  Pt states understanding. Also answered pt questions about scheduled D&C tomorrow, where to check in, what time to be at hospital, etc.

## 2014-06-25 ENCOUNTER — Ambulatory Visit (HOSPITAL_COMMUNITY): Payer: Medicaid Other | Admitting: Anesthesiology

## 2014-06-25 ENCOUNTER — Encounter (HOSPITAL_COMMUNITY): Payer: Self-pay | Admitting: *Deleted

## 2014-06-25 ENCOUNTER — Encounter (HOSPITAL_COMMUNITY): Payer: Medicaid Other | Admitting: Anesthesiology

## 2014-06-25 ENCOUNTER — Ambulatory Visit (HOSPITAL_COMMUNITY)
Admission: RE | Admit: 2014-06-25 | Discharge: 2014-06-25 | Disposition: A | Discharge status: Home / Self Care | Payer: Medicaid Other | Source: Ambulatory Visit | Attending: Family Medicine | Admitting: Family Medicine

## 2014-06-25 ENCOUNTER — Encounter (HOSPITAL_COMMUNITY)
Admission: RE | Disposition: A | Discharge status: Home / Self Care | Payer: Self-pay | Source: Ambulatory Visit | Attending: Family Medicine

## 2014-06-25 DIAGNOSIS — Z87891 Personal history of nicotine dependence: Secondary | ICD-10-CM | POA: Diagnosis not present

## 2014-06-25 DIAGNOSIS — I10 Essential (primary) hypertension: Secondary | ICD-10-CM | POA: Insufficient documentation

## 2014-06-25 DIAGNOSIS — R1031 Right lower quadrant pain: Secondary | ICD-10-CM | POA: Diagnosis present

## 2014-06-25 DIAGNOSIS — E119 Type 2 diabetes mellitus without complications: Secondary | ICD-10-CM | POA: Diagnosis not present

## 2014-06-25 DIAGNOSIS — O021 Missed abortion: Secondary | ICD-10-CM | POA: Insufficient documentation

## 2014-06-25 HISTORY — PX: DILATION AND CURETTAGE OF UTERUS: SHX78

## 2014-06-25 SURGERY — DILATION AND CURETTAGE
Anesthesia: Monitor Anesthesia Care | Site: Vagina

## 2014-06-25 MED ORDER — PROPOFOL 10 MG/ML IV EMUL
INTRAVENOUS | Status: DC | PRN
  Administered 2014-06-25: 20 mg via INTRAVENOUS
  Administered 2014-06-25 (×2): 10 mg via INTRAVENOUS
  Administered 2014-06-25: 20 mg via INTRAVENOUS
  Administered 2014-06-25: 10 mg via INTRAVENOUS
  Administered 2014-06-25: 20 mg via INTRAVENOUS
  Administered 2014-06-25: 10 mg via INTRAVENOUS
  Administered 2014-06-25 (×2): 20 mg via INTRAVENOUS

## 2014-06-25 MED ORDER — LACTATED RINGERS IV SOLN
INTRAVENOUS | Status: DC
  Administered 2014-06-25: 14:00:00 via INTRAVENOUS

## 2014-06-25 MED ORDER — MEPERIDINE HCL 25 MG/ML IJ SOLN: 6.2500 mg | INTRAMUSCULAR | Status: DC | PRN

## 2014-06-25 MED ORDER — MIDAZOLAM HCL 2 MG/2ML IJ SOLN
INTRAMUSCULAR | Status: AC
  Filled 2014-06-25: qty 2

## 2014-06-25 MED ORDER — ONDANSETRON HCL 4 MG/2ML IJ SOLN
INTRAMUSCULAR | Status: AC
  Filled 2014-06-25: qty 2

## 2014-06-25 MED ORDER — LIDOCAINE HCL (CARDIAC) 20 MG/ML IV SOLN
INTRAVENOUS | Status: DC | PRN
  Administered 2014-06-25: 30 mg via INTRAVENOUS

## 2014-06-25 MED ORDER — KETOROLAC TROMETHAMINE 30 MG/ML IJ SOLN
INTRAMUSCULAR | Status: AC
  Filled 2014-06-25: qty 1

## 2014-06-25 MED ORDER — HYDROMORPHONE HCL PF 1 MG/ML IJ SOLN
INTRAMUSCULAR | Status: AC
  Administered 2014-06-25: 0.5 mg via INTRAVENOUS
  Filled 2014-06-25: qty 1

## 2014-06-25 MED ORDER — ONDANSETRON HCL 4 MG/2ML IJ SOLN
INTRAMUSCULAR | Status: DC | PRN
  Administered 2014-06-25: 4 mg via INTRAVENOUS

## 2014-06-25 MED ORDER — PROMETHAZINE HCL 25 MG/ML IJ SOLN: 6.2500 mg | INTRAMUSCULAR | Status: DC | PRN

## 2014-06-25 MED ORDER — KETOROLAC TROMETHAMINE 30 MG/ML IJ SOLN
INTRAMUSCULAR | Status: DC | PRN
  Administered 2014-06-25: 30 mg via INTRAVENOUS

## 2014-06-25 MED ORDER — LIDOCAINE-EPINEPHRINE 1 %-1:100000 IJ SOLN
INTRAMUSCULAR | Status: DC | PRN
  Administered 2014-06-25: 20 mL

## 2014-06-25 MED ORDER — LIDOCAINE-EPINEPHRINE 1 %-1:100000 IJ SOLN
INTRAMUSCULAR | Status: AC
  Filled 2014-06-25: qty 1

## 2014-06-25 MED ORDER — FENTANYL CITRATE 0.05 MG/ML IJ SOLN
INTRAMUSCULAR | Status: AC
  Filled 2014-06-25: qty 2

## 2014-06-25 MED ORDER — PROPOFOL 10 MG/ML IV EMUL
INTRAVENOUS | Status: AC
  Filled 2014-06-25: qty 20

## 2014-06-25 MED ORDER — LIDOCAINE HCL (CARDIAC) 20 MG/ML IV SOLN
INTRAVENOUS | Status: AC
  Filled 2014-06-25: qty 5

## 2014-06-25 MED ORDER — HYDROMORPHONE HCL PF 1 MG/ML IJ SOLN
0.2500 mg | INTRAMUSCULAR | Status: DC | PRN
  Administered 2014-06-25 (×2): 0.5 mg via INTRAVENOUS

## 2014-06-25 MED ORDER — MIDAZOLAM HCL 2 MG/2ML IJ SOLN
INTRAMUSCULAR | Status: DC | PRN
  Administered 2014-06-25: 2 mg via INTRAVENOUS

## 2014-06-25 MED ORDER — FENTANYL CITRATE 0.05 MG/ML IJ SOLN
INTRAMUSCULAR | Status: DC | PRN
  Administered 2014-06-25 (×2): 50 ug via INTRAVENOUS

## 2014-06-25 MED ORDER — KETOROLAC TROMETHAMINE 30 MG/ML IJ SOLN: 15.0000 mg | Freq: Once | INTRAMUSCULAR | Status: DC | PRN

## 2014-06-25 MED ORDER — DOXYCYCLINE HYCLATE 100 MG IV SOLR
100.0000 mg | INTRAVENOUS | Status: AC
  Administered 2014-06-25: 100 mg via INTRAVENOUS
  Filled 2014-06-25: qty 100

## 2014-06-25 SURGICAL SUPPLY — 15 items
CATH ROBINSON RED A/P 16FR (CATHETERS) ×3 IMPLANT
CLOTH BEACON ORANGE TIMEOUT ST (SAFETY) ×3 IMPLANT
CONTAINER PREFILL 10% NBF 60ML (FORM) ×6 IMPLANT
DECANTER SPIKE VIAL GLASS SM (MISCELLANEOUS) ×3 IMPLANT
GLOVE BIOGEL PI IND STRL 7.0 (GLOVE) ×1 IMPLANT
GLOVE BIOGEL PI INDICATOR 7.0 (GLOVE) ×2
GLOVE ECLIPSE 7.0 STRL STRAW (GLOVE) ×6 IMPLANT
GOWN STRL REUS W/TWL LRG LVL3 (GOWN DISPOSABLE) ×9 IMPLANT
PACK VAGINAL MINOR WOMEN LF (CUSTOM PROCEDURE TRAY) ×3 IMPLANT
PAD OB MATERNITY 4.3X12.25 (PERSONAL CARE ITEMS) ×3 IMPLANT
PAD PREP 24X48 CUFFED NSTRL (MISCELLANEOUS) ×3 IMPLANT
SET BERKELEY SUCTION TUBING (SUCTIONS) ×2 IMPLANT
TOWEL OR 17X24 6PK STRL BLUE (TOWEL DISPOSABLE) ×6 IMPLANT
VACURETTE 10 RIGID CVD (CANNULA) ×2 IMPLANT
WATER STERILE IRR 1000ML POUR (IV SOLUTION) ×3 IMPLANT

## 2014-06-25 NOTE — Anesthesia Postprocedure Evaluation (Signed)
Anesthesia Post Note  Patient: Meredith York  Procedure(s) Performed: Procedure(s) (LRB): DILATATION AND CURETTAGE (N/A)  Anesthesia type: MAC  Patient location: PACU  Post pain: Pain level controlled  Post assessment: Post-op Vital signs reviewed  Last Vitals:  Filed Vitals:   06/25/14 1445  BP: 128/74  Pulse: 96  Temp:   Resp: 21    Post vital signs: Reviewed  Level of consciousness: sedated  Complications: No apparent anesthesia complications

## 2014-06-25 NOTE — Op Note (Signed)
Meredith York  PROCEDURE DATE: 06/25/2014  PREOPERATIVE DIAGNOSIS: 8 week missed abortion.  POSTOPERATIVE DIAGNOSIS: The same.  PROCEDURE:    Suction Dilation and Evacuation.  SURGEON:  Caelan Atchley S  ANESTHESIA: Lyn Hollingshead, MD  INDICATIONS: 38 y.o. G5P3012with MAB at [redacted] weeks gestation, needing surgical completion.  Risks of surgery were discussed with the patient including but not limited to: bleeding which may require transfusion; infection which may require antibiotics; injury to uterus or surrounding organs; need for additional procedures including laparotomy or laparoscopy; possibility of intrauterine scarring which may impair future fertility; and other postoperative/anesthesia complications. Written informed consent was obtained.    FINDINGS:  A 10 wk size anteverted/midline/retroverted uterus, moderate amounts of products of conception, specimen sent to pathology.  ANESTHESIA:    Monitored intravenous sedation, paracervical block.  ESTIMATED BLOOD LOSS:  Less than 20 ml.  SPECIMENS:  Products of conception sent to pathology  COMPLICATIONS:  None immediate.  PROCEDURE DETAILS:  The patient received intravenous antibiotics while in the preoperative area.  She was then taken to the operating room where general anesthesia was administered and was found to be adequate.  After an adequate timeout was performed, she was placed in the dorsal lithotomy position and examined; then prepped and draped in the sterile manner.   Her bladder was catheterized for an unmeasured amount of clear, yellow urine. A vaginal speculum was then placed in the patient's vagina and a single tooth tenaculum was applied to the anterior lip of the cervix.  A paracervical block using 1% Lidocaine with Epinephrine was administered. The cervix was gently dilated to accommodate a 10 mm suction curved curette that was gently advanced to the uterine fundus.  The suction device was then activated and curette slowly  rotated to clear the uterus of products of conception.  A sharp curettage was then performed to confirm complete emptying of the uterus.There was minimal bleeding noted and the tenaculum removed with good hemostasis noted. The patient tolerated the procedure well.  The patient was taken to the recovery area in stable condition.  Adrean Findlay S 06/25/2014 2:15 PM

## 2014-06-25 NOTE — H&P (Signed)
Chief Complaint: Miscarriage  HPI: Meredith York 38 y.o. [id number] [redacted]w[redacted]d presented to [facility] with right lower quad pain ongoing for one week. Found to have a missed AB.  Here for definitive treatment. Hx of DM and HTN    Past Medical History   Diagnosis  Date   .  Hypertension     .  Diabetes in pregnancy     .  Migraines     .  Ovarian cyst     .  Chlamydia     .  Trichomonas         Past Surgical History   Procedure  Laterality  Date   .  Cholecystectomy    02/2009   .  Cesarean section    12/27/2011       Procedure: CESAREAN SECTION;  Surgeon: Meredith Bombard, MD;  Location: [facility];  Service: Gynecology;  Laterality: N/A;  Primary cesarean section with delivery of baby girl at 65. Apgars 3/3/6       Family History   Problem  Relation  Age of Onset   .  Hypertension  Maternal Aunt     .  Hypertension  Paternal Aunt     .  Anesthesia problems  Neg Hx     .  Hypotension  Neg Hx     .  Malignant hyperthermia  Neg Hx     .  Pseudochol deficiency  Neg Hx         History   Substance Use Topics   .  Smoking status:  Former Smoker -- 0.00 packs/day for 16 years       Types:  Cigarettes       Quit date:  05/12/2011   .  Smokeless tobacco:  Never Used   .  Alcohol Use:  No     Allergies:   .  Amoxicillin  Hives and Itching    Medications:  .  acetaminophen (TYLENOL) 500 MG tablet  Take 1,000 mg by mouth every 6 (six) hours as needed for pain.         Marland Kitchen  amLODipine (NORVASC) 10 MG tablet  Take 10 mg by mouth daily.         .  diphenhydrAMINE (BENADRYL) 25 MG tablet  Take 25 mg by mouth every 6 (six) hours as needed for allergies.         Marland Kitchen  escitalopram (LEXAPRO) 10 MG tablet  Take 10 mg by mouth daily.         .  fluticasone (FLONASE) 50 MCG/ACT nasal spray  Place 1 spray into both nostrils daily.   16 g   0   .  GuaiFENesin (MUCINEX PO)  Take 1 tablet by mouth every 4 (four) hours as needed (for congestion).         Marland Kitchen  guaiFENesin (ROBITUSSIN) 100 MG/5ML SOLN  Take 15 mLs by  mouth every 4 (four) hours as needed for cough or to loosen phlegm.         Marland Kitchen  guaiFENesin-dextromethorphan (ROBITUSSIN DM) 100-10 MG/5ML syrup  Take 5 mLs by mouth every 4 (four) hours as needed for cough.   118 mL   0   .  metFORMIN (GLUCOPHAGE) 500 MG tablet  Take 500 mg by mouth 2 (two) times daily with a meal.          Review of Systems  Constitutional: Negative.   HENT: Negative.   Eyes: Negative.   Respiratory: Negative.   Cardiovascular: Negative.  Gastrointestinal: Positive for vomiting and abdominal pain.  Genitourinary: Negative.   Musculoskeletal: Negative.   Skin: Negative.   Neurological: Negative.   Endo/Heme/Allergies: Negative.   Psychiatric/Behavioral: Negative.      Physical Exam:  Blood pressure 152/93, pulse 98, resp. rate 18, height 5' 2.5" (1.588 m), weight 98.612 kg (217 lb 6.4 oz), last menstrual period 04/23/2014, SpO2 100.00%. Constitutional: She is oriented to person, place, and time. She appears well-developed and well-nourished. No distress.  HENT:   Head: Normocephalic and atraumatic.  Eyes: Pupils are equal, round, and reactive to light.  GI: Soft. Bowel sounds are normal. She exhibits no mass. There is tenderness. There is no rebound and no guarding.  RLQ  Genitourinary:  Genital:External negative Vaginal:small amount white vaginal discharge Cervix:closed/ thick Bimanual:right adnexal tenderness Musculoskeletal: Normal range of motion.  Neurological: She is alert and oriented to person, place, and time.  Skin: Skin is warm and dry.  Psychiatric: She has a normal mood and affect. Her behavior is normal. Judgment and thought content normal.   Labs:  POCT PREGNANCY, URINE        Preg Test, Ur  POSITIVE (*)  NEGATIVE     CBC        WBC  11.3 (*)  4.0 - 10.5 K/uL     RBC  4.45   3.87 - 5.11 MIL/uL     Hemoglobin  13.2   12.0 - 15.0 g/dL     HCT  39.6   36.0 - 46.0 %     MCV  89.0   78.0 - 100.0 fL     MCH  29.7   26.0 - 34.0 pg     MCHC   33.3   30.0 - 36.0 g/dL     RDW  14.5   11.5 - 15.5 %     Platelets  307   150 - 400 K/uL    HCG, QUANTITATIVE, PREGNANCY        hCG, Beta Chain, Meredith York  632[address] (*)  <5 mIU/mL    Blood Type is O+  Radiology US Ob Comp Less 14 Wks 06/21/2014   CLINICAL DATA:  RLQ pain x1week,worse last night,vomiting,Early pregnant; RLQ pain, Vomiting  EXAM: OBSTETRIC <14 WK [place] AND TRANSVAGINAL OB US  TECHNIQUE: Both transabdominal and transvaginal ultrasound examinations were performed for complete evaluation of the gestation as well as the maternal uterus, adnexal regions, and pelvic cul-de-sac. Transvaginal technique was performed to assess early pregnancy.  COMPARISON:  None.  FINDINGS: Intrauterine gestational sac: Visualized/normal in shape.  Yolk sac:  Visualize  Embryo:  Visualized.  Cardiac Activity: Not appreciated.  Heart Rate:  Not applicable  CRL:   31.5  mm   8 w 1 d                  [place] EDC: 01/30/2014  Moderate sized subchorionic hemorrhage measuring 2.2 x 1.5 x 3.8 cm.  Maternal uterus/adnexae: Unremarkable  IMPRESSION: No appreciable cardiac activity. These findings are consistent with a nonviable intrauterine pregnancy. Correlation with serial beta HCG and ultrasound as clinically warranted recommended. These results were called by telephone at the time of interpretation on 06/21/2014 at 1:36 PM to Dr. Emeterio Reeve, who verbally acknowledged these results.  Subchorionic hemorrhage   Electronically Signed   By: Margaree Mackintosh M.D.   On: 06/21/2014 13:37   Impression: Patient Active Problem List   Diagnosis Date Noted  . Type II or unspecified type diabetes mellitus without mention of  complication, not stated as uncontrolled 06/25/2014  . Abortion, missed 06/25/2014  . Headache 07/05/2011  . Hypertension 07/05/2011   Plan: Suction D & C Risks include but are not limited to bleeding, infection, injury to surrounding structures, including bowel, bladder and ureters, blood clots, and death.   Likelihood of success is high.

## 2014-06-25 NOTE — Discharge Instructions (Addendum)
DO NOT TAKE IBUPROFEN (ADVIL, ALEVE OR MOTRIN) TILL AFTER 8:15 PM!  Dilation and Curettage or Vacuum Curettage, Care After  Refer to this sheet in the next few weeks. These instructions provide you with information on caring for yourself after your procedure. Your health care provider may also give you more specific instructions. Your treatment has been planned according to current medical practices, but problems sometimes occur. Call your health care provider if you have any problems or questions after your procedure.  WHAT TO EXPECT AFTER THE PROCEDURE After your procedure, it is typical to have light cramping and bleeding. This may last for 2 days to 2 weeks after the procedure.  HOME CARE INSTRUCTIONS   Do not drive for 24 hours.  Wait 1 week before returning to strenuous activities.  Take your temperature 2 times a day for 4 days and write it down. Provide these temperatures to your health care provider if you develop a fever.  Avoid long periods of standing.  Avoid heavy lifting, pushing, or pulling. Do not lift anything heavier than 10 pounds (4.5 kg).  Limit stair climbing to once or twice a day.  Take rest periods often.  You may resume your usual diet.  Drink enough fluids to keep your urine clear or pale yellow.  Your usual bowel function should return. If you have constipation, you may:  Take a mild laxative with permission from your health care provider.  Add fruit and bran to your diet.  Drink more fluids.  Take showers instead of baths until your health care provider gives you permission to take baths.  Do not go swimming or use a hot tub until your health care provider approves.  Try to have someone with you or available to you the first 24 48 hours, especially if you were given a general anesthetic.  Do not douche, use tampons, or have intercourse for 2 weeks after the procedure.  Only take over-the-counter or prescription medicines as directed by your  health care provider. Do not take aspirin. It can cause bleeding.  Follow up with your health care provider as directed.  SEEK MEDICAL CARE IF:   You have increasing cramps or pain that is not relieved with medicine.  You have abdominal pain that does not seem to be related to the same area of earlier cramping and pain.  You have bad smelling vaginal discharge.  You have a rash.  You are having problems with any medicine.  SEEK IMMEDIATE MEDICAL CARE IF:   You have bleeding that is heavier than a normal menstrual period.  You have a fever.  You have chest pain.  You have shortness of breath.  You feel dizzy or feel like fainting.  You pass out.  You have pain in your shoulder strap area.  You have heavy vaginal bleeding with or without blood clots.

## 2014-06-25 NOTE — Transfer of Care (Signed)
Immediate Anesthesia Transfer of Care Note  Patient: Meredith York  Procedure(s) Performed: Procedure(s): DILATATION AND CURETTAGE (N/A)  Patient Location: PACU  Anesthesia Type:MAC  Level of Consciousness: awake, alert , oriented and patient cooperative  Airway & Oxygen Therapy: Patient Spontanous Breathing  Post-op Assessment: Report given to PACU RN and Post -op Vital signs reviewed and stable  Post vital signs: Reviewed and stable  Complications: No apparent anesthesia complications

## 2014-06-25 NOTE — Anesthesia Preprocedure Evaluation (Signed)
Anesthesia Evaluation  Patient identified by MRN, date of birth, ID band Patient awake    Reviewed: Allergy & Precautions, H&P , NPO status , Patient's Chart, lab work & pertinent test results  Airway Mallampati: II TM Distance: >3 FB Neck ROM: full    Dental no notable dental hx. (+) Teeth Intact   Pulmonary neg pulmonary ROS, former smoker,          Cardiovascular hypertension, Pt. on medications     Neuro/Psych negative psych ROS   GI/Hepatic negative GI ROS, Neg liver ROS,   Endo/Other  diabetes, Gestational  Renal/GU negative Renal ROS     Musculoskeletal   Abdominal Normal abdominal exam  (+)   Peds  Hematology negative hematology ROS (+)   Anesthesia Other Findings   Reproductive/Obstetrics negative OB ROS                           Anesthesia Physical Anesthesia Plan  ASA: II  Anesthesia Plan: MAC   Post-op Pain Management:    Induction: Intravenous  Airway Management Planned:   Additional Equipment:   Intra-op Plan:   Post-operative Plan:   Informed Consent: I have reviewed the patients History and Physical, chart, labs and discussed the procedure including the risks, benefits and alternatives for the proposed anesthesia with the patient or authorized representative who has indicated his/her understanding and acceptance.     Plan Discussed with: CRNA and Surgeon  Anesthesia Plan Comments:         Anesthesia Quick Evaluation

## 2014-06-26 ENCOUNTER — Encounter (HOSPITAL_COMMUNITY): Payer: Self-pay | Admitting: Family Medicine

## 2014-06-29 NOTE — Addendum Note (Signed)
Addendum created 06/29/14 1226 by Venia Carbon. Royce Macadamia, MD   Modules edited: Anesthesia Responsible Staff

## 2014-07-18 ENCOUNTER — Ambulatory Visit: Payer: Medicaid Other | Admitting: Obstetrics & Gynecology

## 2014-07-18 ENCOUNTER — Telehealth: Payer: Self-pay

## 2014-07-18 NOTE — Telephone Encounter (Signed)
Patient missed today's post-op appointment. Called patient. No answer. Left message stating we are sorry you missed your appointment, please call clinic to reschedule.

## 2014-10-22 ENCOUNTER — Encounter (HOSPITAL_COMMUNITY): Payer: Self-pay | Admitting: Family Medicine

## 2015-01-14 ENCOUNTER — Emergency Department (HOSPITAL_COMMUNITY)
Admission: EM | Admit: 2015-01-14 | Discharge: 2015-01-14 | Disposition: A | Discharge status: Home / Self Care | Payer: Medicaid Other | Attending: Emergency Medicine | Admitting: Emergency Medicine

## 2015-01-14 ENCOUNTER — Encounter (HOSPITAL_COMMUNITY): Payer: Self-pay | Admitting: Emergency Medicine

## 2015-01-14 DIAGNOSIS — Z79899 Other long term (current) drug therapy: Secondary | ICD-10-CM | POA: Diagnosis not present

## 2015-01-14 DIAGNOSIS — M79605 Pain in left leg: Secondary | ICD-10-CM | POA: Diagnosis present

## 2015-01-14 DIAGNOSIS — Z87891 Personal history of nicotine dependence: Secondary | ICD-10-CM | POA: Insufficient documentation

## 2015-01-14 DIAGNOSIS — Z880 Allergy status to penicillin: Secondary | ICD-10-CM | POA: Insufficient documentation

## 2015-01-14 DIAGNOSIS — Z8619 Personal history of other infectious and parasitic diseases: Secondary | ICD-10-CM | POA: Diagnosis not present

## 2015-01-14 DIAGNOSIS — I10 Essential (primary) hypertension: Secondary | ICD-10-CM | POA: Insufficient documentation

## 2015-01-14 DIAGNOSIS — M79609 Pain in unspecified limb: Secondary | ICD-10-CM | POA: Diagnosis not present

## 2015-01-14 DIAGNOSIS — G43909 Migraine, unspecified, not intractable, without status migrainosus: Secondary | ICD-10-CM | POA: Diagnosis not present

## 2015-01-14 DIAGNOSIS — M7989 Other specified soft tissue disorders: Secondary | ICD-10-CM | POA: Diagnosis not present

## 2015-01-14 DIAGNOSIS — Z8742 Personal history of other diseases of the female genital tract: Secondary | ICD-10-CM | POA: Diagnosis not present

## 2015-01-14 MED ORDER — NAPROXEN 500 MG PO TABS: 500.0000 mg | ORAL_TABLET | Freq: Two times a day (BID) | ORAL | Status: DC

## 2015-01-14 NOTE — Discharge Instructions (Signed)

## 2015-01-14 NOTE — ED Notes (Addendum)
Pt c/o left peg pain x few weeks, states mainly in calf area but does radiate down full leg. No hx of DVTs.

## 2015-01-14 NOTE — ED Provider Notes (Signed)
CSN: [id number]     Arrival date & time 01/14/15  1140 History   First MD Initiated Contact with Patient 01/14/15 1200     Chief Complaint  Patient presents with  . Leg Pain    left    Patient is a 39 y.o. female presenting with leg pain. The history is provided by the patient.  Leg Pain Location:  Leg Time since incident:  3 weeks Injury: no   Leg location:  L lower leg Pain details:    Quality:  Aching   Severity:  Moderate   Duration:  3 weeks   Timing:  Constant Chronicity:  New Prior injury to area:  No Relieved by:  Rest Worsened by:  Activity Associated symptoms: no back pain, no fatigue, no fever, no muscle weakness, no swelling and no tingling   No history of DVT or PE.  No prior travel.  Past Medical History  Diagnosis Date  . Hypertension   . Diabetes in pregnancy   . Migraines   . Ovarian cyst   . Chlamydia   . Trichomonas    Past Surgical History  Procedure Laterality Date  . Cholecystectomy  02/2009  . Cesarean section  12/27/2011    Procedure: CESAREAN SECTION;  Surgeon: Shelly Bombard, MD;  Location: [facility];  Service: Gynecology;  Laterality: N/A;  Primary cesarean section with delivery of baby girl at 37. Apgars 3/3/6  . Dilation and curettage of uterus N/A 06/25/2014    Procedure: DILATATION AND CURETTAGE;  Surgeon: Donnamae Jude, MD;  Location: [facility];  Service: Gynecology;  Laterality: N/A;   Family History  Problem Relation Age of Onset  . Hypertension Maternal Aunt   . Hypertension Paternal Aunt   . Anesthesia problems Neg Hx   . Hypotension Neg Hx   . Malignant hyperthermia Neg Hx   . Pseudochol deficiency Neg Hx    History  Substance Use Topics  . Smoking status: Former Smoker -- 0.00 packs/day for 16 years    Types: Cigarettes    Quit date: 05/12/2011  . Smokeless tobacco: Never Used  . Alcohol Use: No   OB History    Gravida Para Term Preterm AB TAB SAB Ectopic Multiple Living   5 3 3  1 1    2      Review of Systems   Constitutional: Negative for fever and fatigue.  Respiratory: Negative for shortness of breath.   Cardiovascular: Negative for chest pain.  Musculoskeletal: Negative for back pain.  All other systems reviewed and are negative.     Allergies  Amoxicillin  Home Medications   Prior to Admission medications   Medication Sig Start Date End Date Taking? Authorizing Provider  amLODipine (NORVASC) 10 MG tablet Take 10 mg by mouth daily.   Yes Historical Provider, MD  metFORMIN (GLUCOPHAGE) 500 MG tablet Take 500 mg by mouth 2 (two) times daily with a meal.   Yes Historical Provider, MD  naproxen (NAPROSYN) 500 MG tablet Take 1 tablet (500 mg total) by mouth 2 (two) times daily with a meal. 01/14/15   Dorie Rank, MD   BP 133/78 mmHg  Pulse 99  Temp(Src) 98.2 F (36.8 C) (Oral)  Resp 16  SpO2 100%  LMP 12/19/2014 (Exact Date)  Breastfeeding? Unknown Physical Exam  Constitutional: She appears well-developed and well-nourished. No distress.  HENT:  Head: Normocephalic and atraumatic.  Right Ear: External ear normal.  Left Ear: External ear normal.  Eyes: Conjunctivae are normal. Right eye exhibits  no discharge. Left eye exhibits no discharge. No scleral icterus.  Neck: Neck supple. No tracheal deviation present.  Cardiovascular: Normal rate, regular rhythm and normal heart sounds.   Pulmonary/Chest: Effort normal and breath sounds normal. No stridor. No respiratory distress. She has no wheezes. She has no rales.  Musculoskeletal: She exhibits tenderness. She exhibits no edema.       Left lower leg: She exhibits tenderness. She exhibits no bony tenderness, no swelling, no edema and no deformity.  TTP LEFT CALF AND POPLITEAL FOSSA, NO MASS , NO EDEMA, NV INTACT  Neurological: She is alert. Cranial nerve deficit: no gross deficits.  Skin: Skin is warm and dry. No rash noted.  Psychiatric: She has a normal mood and affect.  Nursing note and vitals reviewed.   ED Course  Procedures  (including critical care time) Labs Review Labs Reviewed - No data to display  Imaging Review No results found.   EKG Interpretation None      MDM   Final diagnoses:  Pain of left lower extremity   Doppler study performed in the ED.  Negative for dvt.  ?musculoskeletal pain.  Will dc home with nsaids.  Follow up with pcp    Dorie Rank, MD 01/14/15 [phone]

## 2015-01-14 NOTE — Progress Notes (Signed)
VASCULAR LAB PRELIMINARY  PRELIMINARY  PRELIMINARY  PRELIMINARY  Left lower extremity venous duplex completed.    Preliminary report:  Left:  No evidence of DVT, superficial thrombosis, or Baker's cyst.  Kalii Chesmore, RVT 01/14/2015, 12:48 PM

## 2015-01-15 ENCOUNTER — Emergency Department (HOSPITAL_COMMUNITY)
Admission: EM | Admit: 2015-01-15 | Discharge: 2015-01-15 | Disposition: A | Discharge status: Home / Self Care | Payer: Medicaid Other | Attending: Emergency Medicine | Admitting: Emergency Medicine

## 2015-01-15 ENCOUNTER — Encounter (HOSPITAL_COMMUNITY): Payer: Self-pay | Admitting: Emergency Medicine

## 2015-01-15 DIAGNOSIS — Z880 Allergy status to penicillin: Secondary | ICD-10-CM | POA: Diagnosis not present

## 2015-01-15 DIAGNOSIS — R252 Cramp and spasm: Secondary | ICD-10-CM

## 2015-01-15 DIAGNOSIS — Z8619 Personal history of other infectious and parasitic diseases: Secondary | ICD-10-CM | POA: Diagnosis not present

## 2015-01-15 DIAGNOSIS — Z87891 Personal history of nicotine dependence: Secondary | ICD-10-CM | POA: Diagnosis not present

## 2015-01-15 DIAGNOSIS — Z79899 Other long term (current) drug therapy: Secondary | ICD-10-CM | POA: Insufficient documentation

## 2015-01-15 DIAGNOSIS — M791 Myalgia: Secondary | ICD-10-CM | POA: Diagnosis present

## 2015-01-15 DIAGNOSIS — I10 Essential (primary) hypertension: Secondary | ICD-10-CM | POA: Insufficient documentation

## 2015-01-15 DIAGNOSIS — Z8742 Personal history of other diseases of the female genital tract: Secondary | ICD-10-CM | POA: Diagnosis not present

## 2015-01-15 DIAGNOSIS — G43909 Migraine, unspecified, not intractable, without status migrainosus: Secondary | ICD-10-CM | POA: Diagnosis not present

## 2015-01-15 MED ORDER — OXYCODONE-ACETAMINOPHEN 5-325 MG PO TABS
2.0000 | ORAL_TABLET | Freq: Once | ORAL | Status: AC
  Administered 2015-01-15: 2 via ORAL
  Filled 2015-01-15: qty 2

## 2015-01-15 MED ORDER — HYDROCODONE-ACETAMINOPHEN 5-325 MG PO TABS: 1.0000 | ORAL_TABLET | Freq: Four times a day (QID) | ORAL | Status: DC | PRN

## 2015-01-15 NOTE — ED Notes (Signed)
Declined W/C at D/C and was escorted to lobby by RN. 

## 2015-01-15 NOTE — ED Provider Notes (Signed)
CSN: [id number]     Arrival date & time 01/15/15  2108 History   None    This chart was scribed for non-physician practitioner, Montine Circle PA-C working with Pamella Pert, MD by Forrestine Him, ED Scribe. This patient was seen in room TR09C/TR09C and the patient's care was started at 10:14 PM.   Chief Complaint  Patient presents with  . Muscle Pain   The history is provided by the patient. No language interpreter was used.    HPI Comments: Dnasia Gauna is a 39 y.o. female with a PMHx of HTN who presents to the Emergency Department complaining of constant, moderate L calf pain that has been ongoing for last 3 weeks. Pain is described as a cramp and has progressively worsened. She has tried Ibuprofen and Tylenol without any improvement for symptoms. Pt was seen at [facility] yesterday for same. Ultrasound of calf performed without any abnormalities. She was discharged home with prescription for NSAIDS. No recent fever or chills. Pt with known allergy to Amoxicillin.  Past Medical History  Diagnosis Date  . Hypertension   . Diabetes in pregnancy   . Migraines   . Ovarian cyst   . Chlamydia   . Trichomonas    Past Surgical History  Procedure Laterality Date  . Cholecystectomy  02/2009  . Cesarean section  12/27/2011    Procedure: CESAREAN SECTION;  Surgeon: Shelly Bombard, MD;  Location: [facility];  Service: Gynecology;  Laterality: N/A;  Primary cesarean section with delivery of baby girl at 71. Apgars 3/3/6  . Dilation and curettage of uterus N/A 06/25/2014    Procedure: DILATATION AND CURETTAGE;  Surgeon: Donnamae Jude, MD;  Location: [facility];  Service: Gynecology;  Laterality: N/A;   Family History  Problem Relation Age of Onset  . Hypertension Maternal Aunt   . Hypertension Paternal Aunt   . Anesthesia problems Neg Hx   . Hypotension Neg Hx   . Malignant hyperthermia Neg Hx   . Pseudochol deficiency Neg Hx    History  Substance Use Topics  . Smoking status: Former Smoker  -- 0.00 packs/day for 16 years    Types: Cigarettes    Quit date: 05/12/2011  . Smokeless tobacco: Never Used  . Alcohol Use: No   OB History    Gravida Para Term Preterm AB TAB SAB Ectopic Multiple Living   5 3 3  1 1    2      Review of Systems  Cardiovascular: Negative for leg swelling.  Musculoskeletal: Positive for myalgias and arthralgias.      Allergies  Amoxicillin  Home Medications   Prior to Admission medications   Medication Sig Start Date End Date Taking? Authorizing Provider  amLODipine (NORVASC) 10 MG tablet Take 10 mg by mouth daily.    Historical Provider, MD  metFORMIN (GLUCOPHAGE) 500 MG tablet Take 500 mg by mouth 2 (two) times daily with a meal.    Historical Provider, MD  naproxen (NAPROSYN) 500 MG tablet Take 1 tablet (500 mg total) by mouth 2 (two) times daily with a meal. 01/14/15   Dorie Rank, MD   Triage Vitals: BP 141/76 mmHg  Pulse 83  Temp(Src) 98.2 F (36.8 C) (Oral)  Resp 20  SpO2 99%  LMP 12/19/2014 (Exact Date)   Physical Exam  Constitutional: She is oriented to person, place, and time. She appears well-developed and well-nourished.  HENT:  Head: Normocephalic and atraumatic.  Eyes: Conjunctivae and EOM are normal.  Neck: Normal range of  motion.  Cardiovascular: Normal rate.   Pulmonary/Chest: Effort normal.  Abdominal: She exhibits no distension.  Musculoskeletal: Normal range of motion.  Calf nontender to palpation, no bony abnormality or deformity, no erythema, no evidence of DVT, no evidence of septic joint, no cellulitis  Neurological: She is alert and oriented to person, place, and time.  Skin: Skin is dry.  Psychiatric: She has a normal mood and affect. Her behavior is normal. Judgment and thought content normal.  Nursing note and vitals reviewed.   ED Course  Procedures (including critical care time)  DIAGNOSTIC STUDIES: Oxygen Saturation is 99% on RA, Normal by my interpretation.    COORDINATION OF CARE: 10:16 PM-  Will discharge home with pain medication. Discussed treatment plan with pt at bedside and pt agreed to plan.     Labs Review Labs Reviewed - No data to display  Imaging Review No results found.   EKG Interpretation None      MDM   Final diagnoses:  Cramps of left lower extremity   Patient with left lower extremity cramping times several weeks. She was seen yesterday for the same. Had negative Doppler DVT study. No bony abnormality or deformity. No injuries. No evidence of infection. Will discharge with pain medicine and recommend orthopedic follow-up.   I personally performed the services described in this documentation, which was scribed in my presence. The recorded information has been reviewed and is accurate.    Montine Circle, PA-C 01/15/15 [id number]  Pamella Pert, MD 01/16/15 1155

## 2015-01-15 NOTE — ED Notes (Signed)
Pt. reports muscle pain at left calf onset 3 weeks ago , seen here yesterday for the same complaint prescribed pain medication .

## 2015-01-15 NOTE — Discharge Instructions (Signed)

## 2015-01-22 ENCOUNTER — Other Ambulatory Visit: Payer: Self-pay | Admitting: Orthopaedic Surgery

## 2015-01-22 DIAGNOSIS — M545 Low back pain: Secondary | ICD-10-CM

## 2015-01-29 ENCOUNTER — Ambulatory Visit
Admission: RE | Admit: 2015-01-29 | Discharge: 2015-01-29 | Disposition: A | Discharge status: Home / Self Care | Payer: Medicaid Other | Source: Ambulatory Visit | Attending: Orthopaedic Surgery | Admitting: Orthopaedic Surgery

## 2015-01-29 DIAGNOSIS — M545 Low back pain: Secondary | ICD-10-CM

## 2015-03-28 ENCOUNTER — Other Ambulatory Visit (HOSPITAL_COMMUNITY): Payer: Self-pay | Admitting: Orthopaedic Surgery

## 2015-04-04 NOTE — Pre-Procedure Instructions (Signed)
[name]  04/04/2015   Your procedure is scheduled on:  04/10/15  Report to [facility] Admitting at 1020 AM.  Call this number if you have problems the morning of surgery: [phone]   Remember:   Do not eat food or drink liquids after midnight.   Take these medicines the morning of surgery with A SIP OF WATER: norvasc,neurontin,hydrocodone   Do not wear jewelry, make-up or nail polish.  Do not wear lotions, powders, or perfumes. You may wear deodorant.  Do not shave 48 hours prior to surgery. Men may shave face and neck.  Do not bring valuables to the hospital.  [facility] is not responsible                  for any belongings or valuables.               Contacts, dentures or bridgework may not be worn into surgery.  Leave suitcase in the car. After surgery it may be brought to your room.  For patients admitted to the hospital, discharge time is determined by your                treatment team.               Patients discharged the day of surgery will not be allowed to drive  home.  Name and phone number of your driver: family  Special Instructions: Shower using CHG 2 nights before surgery and the night before surgery.  If you shower the day of surgery use CHG.  Use special wash - you have one bottle of CHG for all showers.  You should use approximately 1/3 of the bottle for each shower.   Please read over the following fact sheets that you were given: Pain Booklet, Coughing and Deep Breathing, MRSA Information and Surgical Site Infection Prevention

## 2015-04-05 ENCOUNTER — Encounter (HOSPITAL_COMMUNITY): Payer: Self-pay

## 2015-04-05 ENCOUNTER — Encounter (HOSPITAL_COMMUNITY)
Admission: RE | Admit: 2015-04-05 | Discharge: 2015-04-05 | Disposition: A | Discharge status: Home / Self Care | Payer: Medicaid Other | Source: Ambulatory Visit | Attending: Orthopaedic Surgery | Admitting: Orthopaedic Surgery

## 2015-04-05 ENCOUNTER — Other Ambulatory Visit: Payer: Self-pay

## 2015-04-05 DIAGNOSIS — Z01810 Encounter for preprocedural cardiovascular examination: Secondary | ICD-10-CM | POA: Diagnosis not present

## 2015-04-05 DIAGNOSIS — Z01812 Encounter for preprocedural laboratory examination: Secondary | ICD-10-CM | POA: Insufficient documentation

## 2015-04-05 LAB — COMPREHENSIVE METABOLIC PANEL
ALT: 14 U/L (ref 0–35)
AST: 15 U/L (ref 0–37)
Albumin: 3.8 g/dL (ref 3.5–5.2)
Alkaline Phosphatase: 80 U/L (ref 39–117)
Anion gap: 12 (ref 5–15)
BILIRUBIN TOTAL: 0.3 mg/dL (ref 0.3–1.2)
BUN: 11 mg/dL (ref 6–23)
CO2: 22 mmol/L (ref 19–32)
CREATININE: 0.8 mg/dL (ref 0.50–1.10)
Calcium: 9.2 mg/dL (ref 8.4–10.5)
Chloride: 104 mmol/L (ref 96–112)
GFR calc Af Amer: >90 mL/min (ref >90)
GFR calc non Af Amer: >90 mL/min (ref >90)
Glucose, Bld: 87 mg/dL (ref 70–99)
Potassium: 3.8 mmol/L (ref 3.5–5.1)
Sodium: 138 mmol/L (ref 135–145)
TOTAL PROTEIN: 7.2 g/dL (ref 6.0–8.3)

## 2015-04-05 LAB — PROTIME-INR
INR: 1.04 (ref 0.00–1.49)
Prothrombin Time: 13.7 seconds (ref 11.6–15.2)

## 2015-04-05 LAB — CBC
HEMATOCRIT: 41.1 % (ref 36.0–46.0)
HEMOGLOBIN: 13.1 g/dL (ref 12.0–15.0)
MCH: 29.2 pg (ref 26.0–34.0)
MCHC: 31.9 g/dL (ref 30.0–36.0)
MCV: 91.7 fL (ref 78.0–100.0)
Platelets: 307 10*3/uL (ref 150–400)
RBC: 4.48 MIL/uL (ref 3.87–5.11)
RDW: 14.2 % (ref 11.5–15.5)
WBC: 8.9 10*3/uL (ref 4.0–10.5)

## 2015-04-05 LAB — SURGICAL PCR SCREEN
MRSA, PCR: NEGATIVE
Staphylococcus aureus: NEGATIVE

## 2015-04-05 LAB — HCG, SERUM, QUALITATIVE: PREG SERUM: NEGATIVE

## 2015-04-05 MED ORDER — CHLORHEXIDINE GLUCONATE 4 % EX LIQD: 60.0000 mL | Freq: Once | CUTANEOUS | Status: DC

## 2015-04-09 MED ORDER — CLINDAMYCIN PHOSPHATE 900 MG/50ML IV SOLN
900.0000 mg | INTRAVENOUS | Status: AC
  Administered 2015-04-10: 900 mg via INTRAVENOUS
  Filled 2015-04-09: qty 50

## 2015-04-10 ENCOUNTER — Ambulatory Visit (HOSPITAL_COMMUNITY): Payer: Medicaid Other | Admitting: Anesthesiology

## 2015-04-10 ENCOUNTER — Encounter (HOSPITAL_COMMUNITY)
Admission: RE | Disposition: A | Discharge status: Home / Self Care | Payer: Self-pay | Source: Ambulatory Visit | Attending: Orthopaedic Surgery

## 2015-04-10 ENCOUNTER — Observation Stay (HOSPITAL_COMMUNITY)
Admission: RE | Admit: 2015-04-10 | Discharge: 2015-04-11 | Disposition: A | Discharge status: Home / Self Care | Payer: Medicaid Other | Source: Ambulatory Visit | Attending: Orthopaedic Surgery | Admitting: Orthopaedic Surgery

## 2015-04-10 ENCOUNTER — Encounter (HOSPITAL_COMMUNITY): Payer: Self-pay | Admitting: Surgery

## 2015-04-10 ENCOUNTER — Ambulatory Visit (HOSPITAL_COMMUNITY): Payer: Medicaid Other

## 2015-04-10 DIAGNOSIS — E119 Type 2 diabetes mellitus without complications: Secondary | ICD-10-CM | POA: Diagnosis not present

## 2015-04-10 DIAGNOSIS — G43909 Migraine, unspecified, not intractable, without status migrainosus: Secondary | ICD-10-CM | POA: Insufficient documentation

## 2015-04-10 DIAGNOSIS — Z880 Allergy status to penicillin: Secondary | ICD-10-CM | POA: Insufficient documentation

## 2015-04-10 DIAGNOSIS — I10 Essential (primary) hypertension: Secondary | ICD-10-CM | POA: Insufficient documentation

## 2015-04-10 DIAGNOSIS — Z9889 Other specified postprocedural states: Secondary | ICD-10-CM

## 2015-04-10 DIAGNOSIS — M5117 Intervertebral disc disorders with radiculopathy, lumbosacral region: Secondary | ICD-10-CM | POA: Diagnosis not present

## 2015-04-10 DIAGNOSIS — Z419 Encounter for procedure for purposes other than remedying health state, unspecified: Secondary | ICD-10-CM

## 2015-04-10 DIAGNOSIS — Z87891 Personal history of nicotine dependence: Secondary | ICD-10-CM | POA: Insufficient documentation

## 2015-04-10 HISTORY — DX: Other specified postprocedural states: Z98.890

## 2015-04-10 HISTORY — PX: LUMBAR LAMINECTOMY/DECOMPRESSION MICRODISCECTOMY: SHX5026

## 2015-04-10 LAB — GLUCOSE, CAPILLARY
GLUCOSE-CAPILLARY: 94 mg/dL (ref 70–99)
Glucose-Capillary: 64 mg/dL — ABNORMAL LOW (ref 70–99)
Glucose-Capillary: 79 mg/dL (ref 70–99)
Glucose-Capillary: 85 mg/dL (ref 70–99)
Glucose-Capillary: 88 mg/dL (ref 70–99)
Glucose-Capillary: 128 mg/dL — ABNORMAL HIGH (ref 70–99)

## 2015-04-10 SURGERY — LUMBAR LAMINECTOMY/DECOMPRESSION MICRODISCECTOMY
Anesthesia: General | Laterality: Left

## 2015-04-10 MED ORDER — OXYCODONE-ACETAMINOPHEN 5-325 MG PO TABS: 1.0000 | ORAL_TABLET | Freq: Four times a day (QID) | ORAL | Status: DC | PRN

## 2015-04-10 MED ORDER — ROCURONIUM BROMIDE 50 MG/5ML IV SOLN
INTRAVENOUS | Status: AC
  Filled 2015-04-10: qty 1

## 2015-04-10 MED ORDER — HYDROMORPHONE HCL 1 MG/ML IJ SOLN
0.2500 mg | INTRAMUSCULAR | Status: DC | PRN
  Administered 2015-04-10 (×4): 0.5 mg via INTRAVENOUS

## 2015-04-10 MED ORDER — NEOSTIGMINE METHYLSULFATE 10 MG/10ML IV SOLN
INTRAVENOUS | Status: AC
  Filled 2015-04-10: qty 1

## 2015-04-10 MED ORDER — BUPIVACAINE HCL (PF) 0.25 % IJ SOLN
INTRAMUSCULAR | Status: AC
  Filled 2015-04-10: qty 30

## 2015-04-10 MED ORDER — SODIUM CHLORIDE 0.9 % IJ SOLN: 3.0000 mL | INTRAMUSCULAR | Status: DC | PRN

## 2015-04-10 MED ORDER — METHOCARBAMOL 1000 MG/10ML IJ SOLN
500.0000 mg | Freq: Four times a day (QID) | INTRAVENOUS | Status: DC | PRN
  Filled 2015-04-10: qty 5

## 2015-04-10 MED ORDER — METHOCARBAMOL 1000 MG/10ML IJ SOLN
500.0000 mg | INTRAVENOUS | Status: AC
  Administered 2015-04-10: 500 mg via INTRAVENOUS
  Filled 2015-04-10: qty 5

## 2015-04-10 MED ORDER — FENTANYL CITRATE (PF) 250 MCG/5ML IJ SOLN
INTRAMUSCULAR | Status: AC
  Filled 2015-04-10: qty 5

## 2015-04-10 MED ORDER — GLYCOPYRROLATE 0.2 MG/ML IJ SOLN
INTRAMUSCULAR | Status: AC
  Filled 2015-04-10: qty 4

## 2015-04-10 MED ORDER — PROMETHAZINE HCL 25 MG/ML IJ SOLN: 6.2500 mg | INTRAMUSCULAR | Status: DC | PRN

## 2015-04-10 MED ORDER — AMLODIPINE BESYLATE 10 MG PO TABS
10.0000 mg | ORAL_TABLET | Freq: Every day | ORAL | Status: DC
  Administered 2015-04-11: 10 mg via ORAL
  Filled 2015-04-10: qty 1

## 2015-04-10 MED ORDER — PHENYLEPHRINE 40 MCG/ML (10ML) SYRINGE FOR IV PUSH (FOR BLOOD PRESSURE SUPPORT)
PREFILLED_SYRINGE | INTRAVENOUS | Status: AC
  Filled 2015-04-10: qty 10

## 2015-04-10 MED ORDER — GLYCOPYRROLATE 0.2 MG/ML IJ SOLN
INTRAMUSCULAR | Status: DC | PRN
  Administered 2015-04-10: .8 mg via INTRAVENOUS

## 2015-04-10 MED ORDER — ONDANSETRON HCL 4 MG/2ML IJ SOLN
INTRAMUSCULAR | Status: AC
  Filled 2015-04-10: qty 2

## 2015-04-10 MED ORDER — SENNOSIDES-DOCUSATE SODIUM 8.6-50 MG PO TABS
1.0000 | ORAL_TABLET | Freq: Every evening | ORAL | Status: DC | PRN
  Filled 2015-04-10: qty 1

## 2015-04-10 MED ORDER — DOCUSATE SODIUM 100 MG PO CAPS
100.0000 mg | ORAL_CAPSULE | Freq: Two times a day (BID) | ORAL | Status: DC
  Administered 2015-04-10 – 2015-04-11 (×2): 100 mg via ORAL
  Filled 2015-04-10 (×2): qty 1

## 2015-04-10 MED ORDER — DEXAMETHASONE SODIUM PHOSPHATE 10 MG/ML IJ SOLN
INTRAMUSCULAR | Status: AC
  Filled 2015-04-10: qty 1

## 2015-04-10 MED ORDER — ONDANSETRON HCL 4 MG/2ML IJ SOLN
INTRAMUSCULAR | Status: DC | PRN
Start: 2015-04-10 — End: 2015-04-10
  Administered 2015-04-10: 4 mg via INTRAVENOUS

## 2015-04-10 MED ORDER — SCOPOLAMINE 1 MG/3DAYS TD PT72
1.0000 | MEDICATED_PATCH | TRANSDERMAL | Status: DC
  Administered 2015-04-10: 1.5 mg via TRANSDERMAL

## 2015-04-10 MED ORDER — NEOSTIGMINE METHYLSULFATE 10 MG/10ML IV SOLN
INTRAVENOUS | Status: DC | PRN
  Administered 2015-04-10: 4 mg via INTRAVENOUS

## 2015-04-10 MED ORDER — LIDOCAINE HCL (CARDIAC) 20 MG/ML IV SOLN
INTRAVENOUS | Status: DC | PRN
  Administered 2015-04-10: 100 mg via INTRAVENOUS

## 2015-04-10 MED ORDER — LACTATED RINGERS IV SOLN
INTRAVENOUS | Status: DC
  Administered 2015-04-10 (×2): via INTRAVENOUS

## 2015-04-10 MED ORDER — METHOCARBAMOL 500 MG PO TABS: 500.0000 mg | ORAL_TABLET | Freq: Four times a day (QID) | ORAL | Status: DC | PRN

## 2015-04-10 MED ORDER — OXYCODONE HCL 5 MG/5ML PO SOLN: 5.0000 mg | Freq: Once | ORAL | Status: AC | PRN

## 2015-04-10 MED ORDER — MENTHOL 3 MG MT LOZG: 1.0000 | LOZENGE | OROMUCOSAL | Status: DC | PRN

## 2015-04-10 MED ORDER — ROCURONIUM BROMIDE 100 MG/10ML IV SOLN
INTRAVENOUS | Status: DC | PRN
  Administered 2015-04-10: 50 mg via INTRAVENOUS

## 2015-04-10 MED ORDER — MIDAZOLAM HCL 2 MG/2ML IJ SOLN
INTRAMUSCULAR | Status: AC
  Filled 2015-04-10: qty 2

## 2015-04-10 MED ORDER — DEXAMETHASONE SODIUM PHOSPHATE 4 MG/ML IJ SOLN
INTRAMUSCULAR | Status: DC | PRN
  Administered 2015-04-10: 4 mg via INTRAVENOUS

## 2015-04-10 MED ORDER — SODIUM CHLORIDE 0.9 % IV SOLN: 250.0000 mL | INTRAVENOUS | Status: DC

## 2015-04-10 MED ORDER — FENTANYL CITRATE (PF) 100 MCG/2ML IJ SOLN
INTRAMUSCULAR | Status: DC | PRN
Start: 2015-04-10 — End: 2015-04-10
  Administered 2015-04-10: 100 ug via INTRAVENOUS
  Administered 2015-04-10: 150 ug via INTRAVENOUS
  Administered 2015-04-10: 100 ug via INTRAVENOUS
  Administered 2015-04-10: 50 ug via INTRAVENOUS
  Administered 2015-04-10: 100 ug via INTRAVENOUS

## 2015-04-10 MED ORDER — GABAPENTIN 300 MG PO CAPS
300.0000 mg | ORAL_CAPSULE | Freq: Every day | ORAL | Status: DC | PRN
  Filled 2015-04-10: qty 1

## 2015-04-10 MED ORDER — HYDROMORPHONE HCL 1 MG/ML IJ SOLN
INTRAMUSCULAR | Status: AC
  Filled 2015-04-10: qty 1

## 2015-04-10 MED ORDER — BUPIVACAINE HCL (PF) 0.25 % IJ SOLN
INTRAMUSCULAR | Status: DC | PRN
  Administered 2015-04-10: 10 mL

## 2015-04-10 MED ORDER — PROPOFOL 10 MG/ML IV BOLUS
INTRAVENOUS | Status: AC
  Filled 2015-04-10: qty 20

## 2015-04-10 MED ORDER — MIDAZOLAM HCL 5 MG/5ML IJ SOLN
INTRAMUSCULAR | Status: DC | PRN
  Administered 2015-04-10: 2 mg via INTRAVENOUS

## 2015-04-10 MED ORDER — ONDANSETRON HCL 4 MG/2ML IJ SOLN
4.0000 mg | INTRAMUSCULAR | Status: DC | PRN
  Administered 2015-04-11: 4 mg via INTRAVENOUS
  Filled 2015-04-10: qty 2

## 2015-04-10 MED ORDER — ARTIFICIAL TEARS OP OINT
TOPICAL_OINTMENT | OPHTHALMIC | Status: DC | PRN
  Administered 2015-04-10: 1 via OPHTHALMIC

## 2015-04-10 MED ORDER — HYDROMORPHONE HCL 1 MG/ML IJ SOLN: 1.0000 mg | INTRAMUSCULAR | Status: DC | PRN

## 2015-04-10 MED ORDER — PHENYLEPHRINE HCL 10 MG/ML IJ SOLN
INTRAMUSCULAR | Status: DC | PRN
  Administered 2015-04-10: 80 ug via INTRAVENOUS

## 2015-04-10 MED ORDER — SCOPOLAMINE 1 MG/3DAYS TD PT72
MEDICATED_PATCH | TRANSDERMAL | Status: AC
  Filled 2015-04-10: qty 1

## 2015-04-10 MED ORDER — SODIUM CHLORIDE 0.9 % IJ SOLN
3.0000 mL | Freq: Two times a day (BID) | INTRAMUSCULAR | Status: DC
  Administered 2015-04-10: 3 mL via INTRAVENOUS

## 2015-04-10 MED ORDER — OXYCODONE HCL 5 MG PO TABS
ORAL_TABLET | ORAL | Status: AC
  Filled 2015-04-10: qty 1

## 2015-04-10 MED ORDER — METHOCARBAMOL 500 MG PO TABS
500.0000 mg | ORAL_TABLET | Freq: Four times a day (QID) | ORAL | Status: DC | PRN
  Administered 2015-04-10 – 2015-04-11 (×2): 500 mg via ORAL
  Filled 2015-04-10 (×3): qty 1

## 2015-04-10 MED ORDER — METFORMIN HCL 500 MG PO TABS
500.0000 mg | ORAL_TABLET | Freq: Two times a day (BID) | ORAL | Status: DC
  Administered 2015-04-10 – 2015-04-11 (×2): 500 mg via ORAL
  Filled 2015-04-10 (×4): qty 1

## 2015-04-10 MED ORDER — LIDOCAINE HCL (CARDIAC) 20 MG/ML IV SOLN
INTRAVENOUS | Status: AC
  Filled 2015-04-10: qty 5

## 2015-04-10 MED ORDER — OXYCODONE-ACETAMINOPHEN 5-325 MG PO TABS
1.0000 | ORAL_TABLET | ORAL | Status: DC | PRN
  Administered 2015-04-10 – 2015-04-11 (×4): 2 via ORAL
  Filled 2015-04-10 (×4): qty 2

## 2015-04-10 MED ORDER — POTASSIUM CHLORIDE IN NACL 20-0.45 MEQ/L-% IV SOLN
INTRAVENOUS | Status: DC
  Filled 2015-04-10 (×3): qty 1000

## 2015-04-10 MED ORDER — OXYCODONE HCL 5 MG PO TABS
5.0000 mg | ORAL_TABLET | Freq: Once | ORAL | Status: AC | PRN
  Administered 2015-04-10: 5 mg via ORAL

## 2015-04-10 MED ORDER — PROPOFOL 10 MG/ML IV BOLUS
INTRAVENOUS | Status: DC | PRN
  Administered 2015-04-10: 50 mg via INTRAVENOUS
  Administered 2015-04-10: 200 mg via INTRAVENOUS

## 2015-04-10 MED ORDER — PHENOL 1.4 % MT LIQD: 1.0000 | OROMUCOSAL | Status: DC | PRN

## 2015-04-10 SURGICAL SUPPLY — 42 items
ADH SKN CLS APL DERMABOND .7 (GAUZE/BANDAGES/DRESSINGS) ×1
BUR ROUND FLUTED 4 SOFT TCH (BURR) ×1 IMPLANT
BUR ROUND FLUTED 4MM SOFT TCH (BURR) ×1
COVER SURGICAL LIGHT HANDLE (MISCELLANEOUS) ×3 IMPLANT
DERMABOND ADVANCED (GAUZE/BANDAGES/DRESSINGS) ×2
DERMABOND ADVANCED .7 DNX12 (GAUZE/BANDAGES/DRESSINGS) ×1 IMPLANT
DRAPE MICROSCOPE LEICA (MISCELLANEOUS) ×3 IMPLANT
DRAPE PROXIMA HALF (DRAPES) ×6 IMPLANT
DRSG MEPILEX BORDER 4X4 (GAUZE/BANDAGES/DRESSINGS) ×3 IMPLANT
DRSG MEPILEX BORDER 4X8 (GAUZE/BANDAGES/DRESSINGS) ×3 IMPLANT
DURAPREP 26ML APPLICATOR (WOUND CARE) ×3 IMPLANT
ELECT REM PT RETURN 9FT ADLT (ELECTROSURGICAL) ×3
ELECTRODE REM PT RTRN 9FT ADLT (ELECTROSURGICAL) ×1 IMPLANT
GLOVE BIOGEL PI IND STRL 8 (GLOVE) ×2 IMPLANT
GLOVE BIOGEL PI INDICATOR 8 (GLOVE) ×4
GLOVE ORTHO TXT STRL SZ7.5 (GLOVE) ×6 IMPLANT
GOWN STRL REUS W/ TWL LRG LVL3 (GOWN DISPOSABLE) ×2 IMPLANT
GOWN STRL REUS W/ TWL XL LVL3 (GOWN DISPOSABLE) ×1 IMPLANT
GOWN STRL REUS W/TWL 2XL LVL3 (GOWN DISPOSABLE) ×3 IMPLANT
GOWN STRL REUS W/TWL LRG LVL3 (GOWN DISPOSABLE) ×6
GOWN STRL REUS W/TWL XL LVL3 (GOWN DISPOSABLE) ×3
KIT BASIN OR (CUSTOM PROCEDURE TRAY) ×3 IMPLANT
KIT ROOM TURNOVER OR (KITS) ×3 IMPLANT
MANIFOLD NEPTUNE II (INSTRUMENTS) ×3 IMPLANT
NDL HYPO 25GX1X1/2 BEV (NEEDLE) ×1 IMPLANT
NDL SPNL 18GX3.5 QUINCKE PK (NEEDLE) ×1 IMPLANT
NEEDLE HYPO 25GX1X1/2 BEV (NEEDLE) ×3 IMPLANT
NEEDLE SPNL 18GX3.5 QUINCKE PK (NEEDLE) ×3 IMPLANT
NS IRRIG 1000ML POUR BTL (IV SOLUTION) ×3 IMPLANT
PACK LAMINECTOMY ORTHO (CUSTOM PROCEDURE TRAY) ×3 IMPLANT
PAD ARMBOARD 7.5X6 YLW CONV (MISCELLANEOUS) ×6 IMPLANT
PATTIES SURGICAL .5 X.5 (GAUZE/BANDAGES/DRESSINGS) IMPLANT
PATTIES SURGICAL .75X.75 (GAUZE/BANDAGES/DRESSINGS) IMPLANT
SUT BONE WAX W31G (SUTURE) IMPLANT
SUT VIC AB 0 CT1 27 (SUTURE) ×3
SUT VIC AB 0 CT1 27XBRD ANBCTR (SUTURE) ×1 IMPLANT
SUT VIC AB 2-0 CT1 27 (SUTURE) ×3
SUT VIC AB 2-0 CT1 TAPERPNT 27 (SUTURE) ×1 IMPLANT
SUT VIC AB 3-0 X1 27 (SUTURE) IMPLANT
TOWEL OR 17X24 6PK STRL BLUE (TOWEL DISPOSABLE) ×3 IMPLANT
TOWEL OR 17X26 10 PK STRL BLUE (TOWEL DISPOSABLE) ×3 IMPLANT
WATER STERILE IRR 1000ML POUR (IV SOLUTION) ×3 IMPLANT

## 2015-04-10 NOTE — Anesthesia Postprocedure Evaluation (Signed)
  Anesthesia Post-op Note  Patient: Meredith York  Procedure(s) Performed: Procedure(s): Left L5-S1 Microdiscectomy (Left)  Patient Location: PACU  Anesthesia Type:General  Level of Consciousness: awake and alert   Airway and Oxygen Therapy: Patient Spontanous Breathing  Post-op Pain: mild  Post-op Assessment: Post-op Vital signs reviewed  Post-op Vital Signs: stable  Last Vitals:  Filed Vitals:   04/10/15 1556  BP: 135/91  Pulse: 74  Temp: 36.6 C  Resp: 12    Complications: No apparent anesthesia complications

## 2015-04-10 NOTE — Transfer of Care (Signed)
Immediate Anesthesia Transfer of Care Note  Patient: Meredith York  Procedure(s) Performed: Procedure(s): Left L5-S1 Microdiscectomy (Left)  Patient Location: PACU  Anesthesia Type:General  Level of Consciousness: sedated  Airway & Oxygen Therapy: Patient Spontanous Breathing and Patient connected to nasal cannula oxygen  Post-op Assessment: Report given to RN and Post -op Vital signs reviewed and stable  Post vital signs: stable  Last Vitals:  Filed Vitals:   04/10/15 1041  BP: 166/102  Pulse:   Temp:   Resp:     Complications: No apparent anesthesia complications

## 2015-04-10 NOTE — Op Note (Signed)
[name], [name] NO.:  [id number]  MEDICAL RECORD NO.:  [id number]  LOCATION:  3C07C                        FACILITY:  [facility]  PHYSICIAN:  Kindell Strada C. Lorin Mercy, M.D.    DATE OF BIRTH:  1976/08/03  DATE OF PROCEDURE:  04/10/2015 DATE OF DISCHARGE:                              OPERATIVE REPORT   PREOPERATIVE DIAGNOSIS:  Large left L5-S1 herniated nucleus pulposus with radiculopathy.  POSTOPERATIVE DIAGNOSIS:  Large left L5-S1 herniated nucleus pulposus with radiculopathy.  PROCEDURE:  Left L5-S1 microdiskectomy.  SURGEON:  Jeyren Danowski C. Lorin Mercy, M.D.  ASSISTANT:  Alyson Locket. Velora Heckler., medically necessary and present for the entire procedure.  ESTIMATED BLOOD LOSS:  Minimal.  COMPLICATIONS:  None.  BRIEF HISTORY:  A 39 year old female with large HNP, causing considerable compression, left paracentral with nerve root compression, radiculopathy, positive tension signs, weakness greater than 2 months, progressive difficulty, ambulating, and a large female, 98 kg, with giving way with leg weakness and falling.  DESCRIPTION OF PROCEDURE:  After induction of general anesthesia, the patient was placed on chest rolls, prone position, careful padding, rolled yellow pads underneath her shoulders, underneath her arms.  Leg pumpers were applied.  Back was prepped with DuraPrep.  Area was squared with towels, Betadine, Steri-Drape applied, and laminectomy sheets and drapes.  Needle localization with spinal needle buried to the hilt at the L5-S1 level, confirmed appropriate level based on palpable landmarks.  Incision was made after time-out.  Subperiosteal dissection out on the lamina and extra deep Taylor retractor was placed.  Confirmed with a second x-ray with a Penfield 4 in the interlaminar space.  There was almost some shingling at L5-S1 level and laminotomy was performed at L5 lateral gutter, had some bone removed at the pedicle and portion of S1.  Laminotomy was  performed superior on the left at S1 until the nerve root was visualized as it went around the pedicle and the lateral gutter chunks of ligament were removed and very large bulging disk was present. Nerve root was extremely tight, did not want to mobilize.  Annulus was incised lateral to the shoulder of the nerve root and immediately large chunks of disk came out with slight pressure from the D'Errico near the midline.  Chunks of disks were removed.  Micro straight, regular pituitary, upbiting pituitary, Epstein curettes, and Woodson's were used.  Essentially in the disk, pushing fragments down, removing them with pituitary.  Continue to work until the nerve root was completely decompressed.  Free hockey stick could be passed anterior to the dura with no areas of compression.  Irrigation final passes in the disk space showed no remaining loose materials, copious irrigation.  No veins in the lateral gutter were disturbed.  Operative field was dry.  Deep fascia closed with 0 Vicryl, 2-0 Vicryl in subcutaneous tissue, reapproximation of the fat, subcuticular skin closure, Dermabond, postop dressing, and transferred to recovery room.  Instrument count and needle count were correct.     Crit Obremski C. Lorin Mercy, M.D.     MCY/MEDQ  D:  04/10/2015  T:  04/10/2015  Job:  [id number]

## 2015-04-10 NOTE — Interval H&P Note (Signed)
History and Physical Interval Note:  04/10/2015 12:29 PM  Meredith York  has presented today for surgery, with the diagnosis of Left L5-S1 HNP  The various methods of treatment have been discussed with the patient and family. After consideration of risks, benefits and other options for treatment, the patient has consented to  Procedure(s): Left L5-S1 Microdiscectomy (Left) as a surgical intervention .  The patient's history has been reviewed, patient examined, no change in status, stable for surgery.  I have reviewed the patient's chart and labs.  Questions were answered to the patient's satisfaction.     YATES,MARK C

## 2015-04-10 NOTE — H&P (Signed)
Meredith York is an 39 y.o. female.   CHIEF COMPLAINT:  Low back pain and left lower extremity radiculopathy.   HISTORY OF PRESENT ILLNESS:  A 39 year old black female is being seen at the request of Dr. Erlinda Hong for the above complaint.  Patient states that in 01/2015 she had a sudden onset of low back pain with pain, numbness and tingling radiating down to her left foot.  Denies any injury or previous problems of her onset.  No symptoms in the right leg.  Left leg has been getting progressively weaker.  No complaints of bowel or bladder incontinence.  She did go for [facility] with Dr. Ernestina Patches and not having any improvement.  She states she had a lot of discomfort with the injection.    CURRENT MEDICATIONS:  Metformin, Norvasc.   ALLERGIES:  AMOXICILLIN.   PAST MEDICAL HISTORY:  Type 2 diabetes, hypertension, cholecystectomy, emergency C-section.   FAMILY HISTORY:  Positive for diabetes, hypertension.   SOCIAL HISTORY:  Patient is single.  Admits smoking and occasional alcohol use.     Past Medical History  Diagnosis Date  . Hypertension   . Diabetes in pregnancy   . Migraines   . Ovarian cyst   . Chlamydia   . Trichomonas     Past Surgical History  Procedure Laterality Date  . Cholecystectomy  02/2009  . Cesarean section  12/27/2011    Procedure: CESAREAN SECTION;  Surgeon: Shelly Bombard, MD;  Location: [facility];  Service: Gynecology;  Laterality: N/A;  Primary cesarean section with delivery of baby girl at 71. Apgars 3/3/6  . Dilation and curettage of uterus N/A 06/25/2014    Procedure: DILATATION AND CURETTAGE;  Surgeon: Donnamae Jude, MD;  Location: [facility];  Service: Gynecology;  Laterality: N/A;    Family History  Problem Relation Age of Onset  . Hypertension Maternal Aunt   . Hypertension Paternal Aunt   . Anesthesia problems Neg Hx   . Hypotension Neg Hx   . Malignant hyperthermia Neg Hx   . Pseudochol deficiency Neg Hx    Social History:  reports that she quit smoking about 3  years ago. Her smoking use included Cigarettes. She smoked 0.00 packs per day for 16 years. She has never used smokeless tobacco. She reports that she does not drink alcohol or use illicit drugs.  Allergies:  Allergies  Allergen Reactions  . Amoxicillin Hives and Itching    Medications Prior to Admission  Medication Sig Dispense Refill  . amLODipine (NORVASC) 10 MG tablet Take 10 mg by mouth daily.    . cyclobenzaprine (FLEXERIL) 10 MG tablet Take 10 mg by mouth 3 (three) times daily.  0  . gabapentin (NEURONTIN) 300 MG capsule Take 300 mg by mouth daily as needed. pain  0  . metFORMIN (GLUCOPHAGE) 500 MG tablet Take 500 mg by mouth 2 (two) times daily with a meal.    . naproxen (NAPROSYN) 500 MG tablet Take 1 tablet (500 mg total) by mouth 2 (two) times daily with a meal. 30 tablet 0  . HYDROcodone-acetaminophen (NORCO/VICODIN) 5-325 MG per tablet Take 1-2 tablets by mouth every 6 (six) hours as needed. (Patient not taking: Reported on 04/02/2015) 10 tablet 0    No results found for this or any previous visit (from the past 48 hour(s)). No results found.  Review of Systems  Unable to perform ROS   Blood pressure 166/102, pulse 91, temperature 97.5 F (36.4 C), temperature source Oral, resp. rate 18, last menstrual  period 03/15/2015, SpO2 100 %, unknown if currently breastfeeding. Physical Exam  PHYSICAL EXAMINATION:  Pleasant black female, alert and oriented x3 in no acute distress.  Gait is somewhat antalgic due to back and leg discomfort.  She has difficulty with heel and toe gait on the left and positive left lumbar paraspinal and left-sided notch tenderness.  Negative on the right side.  Negative log roll bilateral hips.  Positive left straight leg raise.  She does have trace left anterior tib and gastroc weakness.  Strong on the right.  Sensation intact.  Skin warm and dry.  No increased in respiratory effort.   DIAGNOSTIC STUDIES:  MRI lumbar spine 02/11/2015.  Report read L5-S1  moderate size left subarticular disc extrusion resulting in severe left lateral recess stenosis with mass effect on the left S1 nerve root.  The left S2 nerve root is also slightly deviated medially in the thecal sac.  There is no spinal canal or neural foraminal stenosis.  Mild bilateral facet hypertrophy.  ASSESSMENT:  1.  Low back pain and left lower extremity radiculopathy with weakness. 2.  Left L5-S1 HNP.   PLAN:  At this point, we advised patient that best treatment option for this ongoing issue would be left L5-S1 microdiscectomy.  The surgery, procedure and potential recovery/rehab time discussed.  All questions answered.    Renesmay Nesbitt M 04/10/2015, 11:52 AM

## 2015-04-10 NOTE — Brief Op Note (Signed)
04/10/2015  2:34 PM  PATIENT:  Meredith York  39 y.o. female  PRE-OPERATIVE DIAGNOSIS:  Left L5-S1 HNP  POST-OPERATIVE DIAGNOSIS:  same  PROCEDURE:  Procedure(s): Left L5-S1 Microdiscectomy (Left)  SURGEON:  Surgeon(s) and Role:    * Marybelle Killings, MD - Primary  PHYSICIAN ASSISTANT: Benjiman Core pa-c ANESTHESIA:   general  EBL:  Total I/O In: 1000 [I.V.:1000] Out: 75 [Blood:75]  BLOOD ADMINISTERED:none  DRAINS: none   LOCAL MEDICATIONS USED:  MARCAINE     SPECIMEN:  No Specimen  DISPOSITION OF SPECIMEN:  N/A  COUNTS:  YES  TOURNIQUET:  * No tourniquets in log *    PATIENT DISPOSITION:  PACU - hemodynamically stable.

## 2015-04-10 NOTE — Anesthesia Preprocedure Evaluation (Addendum)
Anesthesia Evaluation  Patient identified by MRN, date of birth, ID band Patient awake    Reviewed: Allergy & Precautions, NPO status   History of Anesthesia Complications Negative for: history of anesthetic complications  Airway Mallampati: II  TM Distance: >3 FB Neck ROM: Full    Dental  (+) Teeth Intact, Dental Advisory Given   Pulmonary former smoker,    Pulmonary exam normal       Cardiovascular hypertension,     Neuro/Psych  Headaches, negative psych ROS   GI/Hepatic negative GI ROS, Neg liver ROS,   Endo/Other  diabetesMorbid obesity  Renal/GU      Musculoskeletal   Abdominal   Peds  Hematology   Anesthesia Other Findings   Reproductive/Obstetrics                            Anesthesia Physical Anesthesia Plan  ASA: III  Anesthesia Plan: General   Post-op Pain Management:    Induction: Intravenous  Airway Management Planned: Oral ETT  Additional Equipment:   Intra-op Plan:   Post-operative Plan: Extubation in OR  Informed Consent: I have reviewed the patients History and Physical, chart, labs and discussed the procedure including the risks, benefits and alternatives for the proposed anesthesia with the patient or authorized representative who has indicated his/her understanding and acceptance.   Dental advisory given  Plan Discussed with: CRNA, Anesthesiologist and Surgeon  Anesthesia Plan Comments:        Anesthesia Quick Evaluation

## 2015-04-11 DIAGNOSIS — M5117 Intervertebral disc disorders with radiculopathy, lumbosacral region: Secondary | ICD-10-CM | POA: Diagnosis not present

## 2015-04-11 LAB — CBC
HCT: 38.4 % (ref 36.0–46.0)
Hemoglobin: 12.6 g/dL (ref 12.0–15.0)
MCH: 29.2 pg (ref 26.0–34.0)
MCHC: 32.8 g/dL (ref 30.0–36.0)
MCV: 89.1 fL (ref 78.0–100.0)
PLATELETS: 323 10*3/uL (ref 150–400)
RBC: 4.31 MIL/uL (ref 3.87–5.11)
RDW: 13.8 % (ref 11.5–15.5)
WBC: 12.3 10*3/uL — ABNORMAL HIGH (ref 4.0–10.5)

## 2015-04-11 LAB — BASIC METABOLIC PANEL
ANION GAP: 11 (ref 5–15)
BUN: 7 mg/dL (ref 6–23)
CO2: 24 mmol/L (ref 19–32)
Calcium: 9.3 mg/dL (ref 8.4–10.5)
Chloride: 104 mmol/L (ref 96–112)
Creatinine, Ser: 0.82 mg/dL (ref 0.50–1.10)
GFR calc non Af Amer: 90 mL/min — ABNORMAL LOW (ref >90)
Glucose, Bld: 100 mg/dL — ABNORMAL HIGH (ref 70–99)
Potassium: 3.9 mmol/L (ref 3.5–5.1)
Sodium: 139 mmol/L (ref 135–145)

## 2015-04-11 LAB — GLUCOSE, CAPILLARY: Glucose-Capillary: 95 mg/dL (ref 70–99)

## 2015-04-11 NOTE — Progress Notes (Signed)
UR completed 

## 2015-04-11 NOTE — Evaluation (Addendum)
Physical Therapy Evaluation Patient Details Name: Meredith York MRN: [id number] DOB: September 05, 1976 Today's Date: 04/11/2015   History of Present Illness  Pt s/p microdiscectomy.  Clinical Impression  Pt doing well with mobility and no further PT needed.  Ready for dc from PT standpoint. Instructed pt that walking is only recommended exercise after back surgery. Also instructed pt to only sit for 30-60 minutes at a time and to get up and move frequently.      Follow Up Recommendations No PT follow up    Equipment Recommendations  None recommended by PT    Recommendations for Other Services       Precautions / Restrictions Precautions Precautions: Back Precaution Booklet Issued: Yes (comment)      Mobility  Bed Mobility Overal bed mobility: Modified Independent             General bed mobility comments: Performed from flat bed without rails. Verbal cues for technique.  Transfers Overall transfer level: Modified independent Equipment used: None                Ambulation/Gait Ambulation/Gait assistance: Modified independent (Device/Increase time) Ambulation Distance (Feet): 800 Feet Assistive device: None Gait Pattern/deviations: Step-through pattern   Gait velocity interpretation: Below normal speed for age/gender General Gait Details: Slightly slowed but steady.  Stairs            Wheelchair Mobility    Modified Rankin (Stroke Patients Only)       Balance Overall balance assessment: No apparent balance deficits (not formally assessed)                                           Pertinent Vitals/Pain Pain Assessment: Faces Faces Pain Scale: Hurts little more Pain Location: back Pain Descriptors / Indicators: Sore Pain Intervention(s): Limited activity within patient's tolerance;Ice applied    Home Living Family/patient expects to be discharged to:: Private residence Living Arrangements: Children;Other relatives  (sister) Available Help at Discharge: Family;Available PRN/intermittently Type of Home: House       Home Layout: Two level;Bed/bath upstairs Home Equipment: None      Prior Function Level of Independence: Independent               Hand Dominance        Extremity/Trunk Assessment   Upper Extremity Assessment: Overall WFL for tasks assessed           Lower Extremity Assessment: Overall WFL for tasks assessed         Communication   Communication: No difficulties  Cognition Arousal/Alertness: Awake/alert Behavior During Therapy: WFL for tasks assessed/performed Overall Cognitive Status: Within Functional Limits for tasks assessed                      General Comments      Exercises        Assessment/Plan    PT Assessment Patent does not need any further PT services  PT Diagnosis Difficulty walking   PT Problem List    PT Treatment Interventions     PT Goals (Current goals can be found in the Care Plan section) Acute Rehab PT Goals PT Goal Formulation: All assessment and education complete, DC therapy    Frequency     Barriers to discharge        Co-evaluation  End of Session   Activity Tolerance: Patient tolerated treatment well Patient left: Other (comment) (standing in room) Nurse Communication: Mobility status         Time: 0950-1008 PT Time Calculation (min) (ACUTE ONLY): 18 min   Charges:   PT Evaluation $Initial PT Evaluation Tier I: 1 Procedure     PT G Codes:        Maureen Delatte 13-Apr-2015, 10:31 AM  Suanne Marker PT [phone]

## 2015-04-11 NOTE — Progress Notes (Signed)
Subjective: Doing well.  preop leg pain gone.  No voiding issues.  Good hall ambulation.  Wants to go home.    Objective: Vital signs in last 24 hours: Temp:  [97.5 F (36.4 C)-98.2 F (36.8 C)] 98.2 F (36.8 C) (04/21 0415) Pulse Rate:  [67-101] 74 (04/21 0415) Resp:  [12-21] 18 (04/21 0415) BP: (119-166)/(71-102) 126/74 mmHg (04/21 0415) SpO2:  [97 %-100 %] 100 % (04/21 0415)  Intake/Output from previous day: 04/20 0701 - 04/21 0700 In: 1500 [I.V.:1500] Out: 75 [Blood:75] Intake/Output this shift:     Recent Labs  04/11/15 0727  HGB 12.6    Recent Labs  04/11/15 0727  WBC 12.3*  RBC 4.31  HCT 38.4  PLT 323   No results for input(s): NA, K, CL, CO2, BUN, CREATININE, GLUCOSE, CALCIUM in the last 72 hours. No results for input(s): LABPT, INR in the last 72 hours.  Exam:  Dressing cdi.  bilat calves nontender, neurologically intact.    Assessment/Plan: D/c home today.  Scripts for percocet and robaxin on chart.  F/u in office one week.     Makayla Confer M 04/11/2015, 8:03 AM

## 2015-04-12 ENCOUNTER — Encounter (HOSPITAL_COMMUNITY): Payer: Self-pay | Admitting: Orthopaedic Surgery

## 2015-04-16 NOTE — Progress Notes (Signed)
PT Note - Late G Code Entry    04/16/2015 1035  PT G-Codes **NOT FOR INPATIENT CLASS**  Functional Assessment Tool Used clinical judgement  Functional Limitation Mobility: Walking and moving around  Mobility: Walking and Moving Around Current Status (C8833) CI  Mobility: Walking and Moving Around Goal Status [phone]) CH  Mobility: Walking and Moving Around Discharge Status [phone]) CI   [facility] PT [phone]

## 2015-04-26 NOTE — Discharge Summary (Signed)
Patient ID: Meredith York MRN: [id number] DOB/AGE: [birth year] 38 y.o.  Admit date: 04/10/2015 Discharge date: 04/26/2015  Admission Diagnoses:  Active Problems:   S/P lumbar discectomy   Discharge Diagnoses:  Active Problems:   S/P lumbar discectomy  status post Procedure(s): Left L5-S1 Microdiscectomy  Past Medical History  Diagnosis Date  . Hypertension   . Diabetes in pregnancy   . Migraines   . Ovarian cyst   . Chlamydia   . Trichomonas     Surgeries: Procedure(s): Left L5-S1 Microdiscectomy on 04/10/2015   Consultants:    Discharged Condition: Improved  Hospital Course: Meredith York is an 39 y.o. female who was admitted 04/10/2015 for operative treatment of lumbar hnp. Patient failed conservative treatments (please see the history and physical for the specifics) and had severe unremitting pain that affects sleep, daily activities and work/hobbies. After pre-op clearance, the patient was taken to the operating room on 04/10/2015 and underwent  Procedure(s): Left L5-S1 Microdiscectomy.    Patient was given perioperative antibiotics:  Anti-infectives    Start     Dose/Rate Route Frequency Ordered Stop   04/10/15 0600  clindamycin (CLEOCIN) IVPB 900 mg     900 mg 100 mL/hr over 30 Minutes Intravenous On call to O.R. 04/09/15 1343 04/10/15 1308       Patient was given sequential compression devices and early ambulation to prevent DVT.   Patient benefited maximally from hospital stay and there were no complications. At the time of discharge, the patient was urinating/moving their bowels without difficulty, tolerating a regular diet, pain is controlled with oral pain medications and they have been cleared by PT/OT.   Recent vital signs: No data found.    Recent laboratory studies: No results for input(s): WBC, HGB, HCT, PLT, NA, K, CL, CO2, BUN, CREATININE, GLUCOSE, INR, CALCIUM in the last 72 hours.  Invalid input(s): PT, 2   Discharge Medications:      Medication List    STOP taking these medications        cyclobenzaprine 10 MG tablet  Commonly known as:  FLEXERIL     HYDROcodone-acetaminophen 5-325 MG per tablet  Commonly known as:  NORCO/VICODIN     naproxen 500 MG tablet  Commonly known as:  NAPROSYN      TAKE these medications        amLODipine 10 MG tablet  Commonly known as:  NORVASC  Take 10 mg by mouth daily.     gabapentin 300 MG capsule  Commonly known as:  NEURONTIN  Take 300 mg by mouth daily as needed. pain     metFORMIN 500 MG tablet  Commonly known as:  GLUCOPHAGE  Take 500 mg by mouth 2 (two) times daily with a meal.     methocarbamol 500 MG tablet  Commonly known as:  ROBAXIN  Take 1 tablet (500 mg total) by mouth every 6 (six) hours as needed for muscle spasms.     oxyCODONE-acetaminophen 5-325 MG per tablet  Commonly known as:  ROXICET  Take 1-2 tablets by mouth every 6 (six) hours as needed for severe pain.        Diagnostic Studies: Dg Lumbar Spine 2-3 Views  04/10/2015   CLINICAL DATA:  Left L5-S1 microdiskectomy.  EXAM: LUMBAR SPINE - 2-3 VIEW  COMPARISON:  MRI scan of January 29, 2015.  FINDINGS: Two intraoperative cross-table lateral views of the lumbar spine were obtained. The first image demonstrates surgical probe directed toward posterior spinous process of L5. The second  image demonstrates surgical probe directed toward the posterior margin of L5-S1 disc space. Surgical retractors are seen in the posterior soft tissues.  IMPRESSION: Surgical localization of L5-S1 as described above.   Electronically Signed   By: Marijo Conception, M.D.   On: 04/10/2015 16:38        Discharge Instructions    Call MD / Call 911    Complete by:  As directed   If you experience chest pain or shortness of breath, CALL 911 and be transported to the hospital emergency room.  If you develope a fever above 101 F, pus (white drainage) or increased drainage or redness at the wound, or calf pain, call your surgeon's  office.     Constipation Prevention    Complete by:  As directed   Drink plenty of fluids.  Prune juice may be helpful.  You may use a stool softener, such as Colace (over the counter) 100 mg twice a day.  Use MiraLax (over the counter) for constipation as needed.     Diet - low sodium heart healthy    Complete by:  As directed      Discharge instructions    Complete by:  As directed   Ok to shower 5 days postop.  Do not apply any creams or ointments to incision.  Do not remove steri-strips.  Can use 4x4 gauze and tape for dressing changes.  No aggressive activity.  No bending, squatting or prolonged sitting.  Mostly be in reclined position or lying down.     Driving restrictions    Complete by:  As directed   No driving until further notice.     Increase activity slowly as tolerated    Complete by:  As directed      Lifting restrictions    Complete by:  As directed   No lifting until further notice.           Follow-up Information    Schedule an appointment as soon as possible for a visit with Marybelle Killings, MD.   Specialty:  Orthopedic Surgery   Why:  need return office visit one week postop.Minette Brine information:   [address] [place] [address] [phone]       Discharge Plan:  discharge to home  Disposition:     Signed: Lanae Crumbly 04/26/2015, 11:56 AM

## 2015-06-17 ENCOUNTER — Other Ambulatory Visit: Payer: Self-pay

## 2016-01-07 ENCOUNTER — Inpatient Hospital Stay (HOSPITAL_COMMUNITY): Payer: Medicaid Other

## 2016-01-07 ENCOUNTER — Encounter (HOSPITAL_COMMUNITY): Payer: Self-pay | Admitting: *Deleted

## 2016-01-07 ENCOUNTER — Inpatient Hospital Stay (HOSPITAL_COMMUNITY)
Admission: AD | Admit: 2016-01-07 | Discharge: 2016-01-07 | Disposition: A | Discharge status: Home / Self Care | Payer: Medicaid Other | Source: Ambulatory Visit | Attending: Family Medicine | Admitting: Family Medicine

## 2016-01-07 DIAGNOSIS — O26899 Other specified pregnancy related conditions, unspecified trimester: Secondary | ICD-10-CM

## 2016-01-07 DIAGNOSIS — O26891 Other specified pregnancy related conditions, first trimester: Secondary | ICD-10-CM | POA: Diagnosis not present

## 2016-01-07 DIAGNOSIS — O9989 Other specified diseases and conditions complicating pregnancy, childbirth and the puerperium: Secondary | ICD-10-CM

## 2016-01-07 DIAGNOSIS — O219 Vomiting of pregnancy, unspecified: Secondary | ICD-10-CM

## 2016-01-07 DIAGNOSIS — E119 Type 2 diabetes mellitus without complications: Secondary | ICD-10-CM | POA: Insufficient documentation

## 2016-01-07 DIAGNOSIS — R1030 Lower abdominal pain, unspecified: Secondary | ICD-10-CM | POA: Diagnosis present

## 2016-01-07 DIAGNOSIS — Z3A01 Less than 8 weeks gestation of pregnancy: Secondary | ICD-10-CM | POA: Diagnosis not present

## 2016-01-07 DIAGNOSIS — Z3491 Encounter for supervision of normal pregnancy, unspecified, first trimester: Secondary | ICD-10-CM

## 2016-01-07 DIAGNOSIS — R109 Unspecified abdominal pain: Secondary | ICD-10-CM

## 2016-01-07 DIAGNOSIS — O24311 Unspecified pre-existing diabetes mellitus in pregnancy, first trimester: Secondary | ICD-10-CM | POA: Insufficient documentation

## 2016-01-07 LAB — URINALYSIS, ROUTINE W REFLEX MICROSCOPIC
Bilirubin Urine: NEGATIVE
GLUCOSE, UA: NEGATIVE mg/dL
KETONES UR: NEGATIVE mg/dL
LEUKOCYTES UA: NEGATIVE
Nitrite: NEGATIVE
PROTEIN: NEGATIVE mg/dL
Specific Gravity, Urine: 1.025 (ref 1.005–1.030)
pH: 5.5 (ref 5.0–8.0)

## 2016-01-07 LAB — URINE MICROSCOPIC-ADD ON

## 2016-01-07 LAB — POCT PREGNANCY, URINE: Preg Test, Ur: POSITIVE — AB

## 2016-01-07 LAB — HCG, QUANTITATIVE, PREGNANCY: hCG, Beta Chain, Quant, S: 75055 m[IU]/mL — ABNORMAL HIGH (ref <5)

## 2016-01-07 MED ORDER — NAPROXEN SODIUM 550 MG PO TABS: 550.0000 mg | ORAL_TABLET | Freq: Two times a day (BID) | ORAL | Status: DC

## 2016-01-07 MED ORDER — PROMETHAZINE HCL 25 MG PO TABS: 25.0000 mg | ORAL_TABLET | Freq: Four times a day (QID) | ORAL | Status: DC | PRN

## 2016-01-07 NOTE — Discharge Instructions (Signed)
Safe Medications in Pregnancy   Acne: Benzoyl Peroxide Salicylic Acid  Backache/Headache: Tylenol: 2 regular strength every 4 hours OR              2 Extra strength every 6 hours  Colds/Coughs/Allergies: Benadryl (alcohol free) 25 mg every 6 hours as needed Breath right strips Claritin Cepacol throat lozenges Chloraseptic throat spray Cold-Eeze- up to three times per day Cough drops, alcohol free Flonase (by prescription only) Guaifenesin Mucinex Robitussin DM (plain only, alcohol free) Saline nasal spray/drops Sudafed (pseudoephedrine) & Actifed ** use only after [redacted] weeks gestation and if you do not have high blood pressure Tylenol Vicks Vaporub Zinc lozenges Zyrtec   Constipation: Colace Ducolax suppositories Fleet enema Glycerin suppositories Metamucil Milk of magnesia Miralax Senokot Smooth move tea  Diarrhea: Kaopectate Imodium A-D  *NO pepto Bismol  Hemorrhoids: Anusol Anusol HC Preparation H Tucks  Indigestion: Tums Maalox Mylanta Zantac  Pepcid  Insomnia: Benadryl (alcohol free) 25mg  every 6 hours as needed Tylenol PM Unisom, no Gelcaps  Leg Cramps: Tums MagGel  Nausea/Vomiting:  Bonine Dramamine Emetrol Ginger extract Sea bands Meclizine  Nausea medication to take during pregnancy:  Unisom (doxylamine succinate 25 mg tablets) Take one tablet daily at bedtime. If symptoms are not adequately controlled, the dose can be increased to a maximum recommended dose of two tablets daily (1/2 tablet in the morning, 1/2 tablet mid-afternoon and one at bedtime). Vitamin B6 100mg  tablets. Take one tablet twice a day (up to 200 mg per day).  Skin Rashes: Aveeno products Benadryl cream or 25mg  every 6 hours as needed Calamine Lotion 1% cortisone cream  Yeast infection: Gyne-lotrimin 7 Monistat 7   **If taking multiple medications, please check labels to avoid duplicating the same active ingredients **take medication as directed on  the label ** Do not exceed 4000 mg of tylenol in 24 hours **Do not take medications that contain aspirin or ibuprofen   Subchorionic Hematoma A subchorionic hematoma is a gathering of blood between the outer wall of the placenta and the inner wall of the womb (uterus). The placenta is the organ that connects the fetus to the wall of the uterus. The placenta performs the feeding, breathing (oxygen to the fetus), and waste removal (excretory work) of the fetus.  Subchorionic hematoma is the most common abnormality found on a result from ultrasonography done during the first trimester or early second trimester of pregnancy. If there has been little or no vaginal bleeding, early small hematomas usually shrink on their own and do not affect your baby or pregnancy. The blood is gradually absorbed over 1-2 weeks. When bleeding starts later in pregnancy or the hematoma is larger or occurs in an older pregnant woman, the outcome may not be as good. Larger hematomas may get bigger, which increases the chances for miscarriage. Subchorionic hematoma also increases the risk of premature detachment of the placenta from the uterus, preterm (premature) labor, and stillbirth. HOME CARE INSTRUCTIONS  Stay on bed rest if your health care provider recommends this. Although bed rest will not prevent more bleeding or prevent a miscarriage, your health care provider may recommend bed rest until you are advised otherwise.  Avoid heavy lifting (more than 10 lb [4.5 kg]), exercise, sexual intercourse, or douching as directed by your health care provider.  Keep track of the number of pads you use each day and how soaked (saturated) they are. Write down this information.  Do not use tampons.  Keep all follow-up appointments as directed by your  health care provider. Your health care provider may ask you to have follow-up blood tests or ultrasound tests or both. SEEK IMMEDIATE MEDICAL CARE IF:  You have severe cramps in your  stomach, back, abdomen, or pelvis.  You have a fever.  You pass large clots or tissue. Save any tissue for your health care provider to look at.  Your bleeding increases or you become lightheaded, feel weak, or have fainting episodes.   This information is not intended to replace advice given to you by your health care provider. Make sure you discuss any questions you have with your health care provider.   Document Released: 03/24/2007 Document Revised: 12/28/2014 Document Reviewed: 07/06/2013 Elsevier Interactive Patient Education 2016 [employer] Vaginal Bleeding During Pregnancy, First Trimester A small amount of bleeding (spotting) from the vagina is relatively common in early pregnancy. It usually stops on its own. Various things may cause bleeding or spotting in early pregnancy. Some bleeding may be related to the pregnancy, and some may not. In most cases, the bleeding is normal and is not a problem. However, bleeding can also be a sign of something serious. Be sure to tell your health care provider about any vaginal bleeding right away. Some possible causes of vaginal bleeding during the first trimester include:  Infection or inflammation of the cervix.  Growths (polyps) on the cervix.  Miscarriage or threatened miscarriage.  Pregnancy tissue has developed outside of the uterus and in a fallopian tube (tubal pregnancy).  Tiny cysts have developed in the uterus instead of pregnancy tissue (molar pregnancy). HOME CARE INSTRUCTIONS  Watch your condition for any changes. The following actions may help to lessen any discomfort you are feeling:  Follow your health care provider's instructions for limiting your activity. If your health care provider orders bed rest, you may need to stay in bed and only get up to use the bathroom. However, your health care provider may allow you to continue light activity.  If needed, make plans for someone to help with your regular activities and  responsibilities while you are on bed rest.  Keep track of the number of pads you use each day, how often you change pads, and how soaked (saturated) they are. Write this down.  Do not use tampons. Do not douche.  Do not have sexual intercourse or orgasms until approved by your health care provider.  If you pass any tissue from your vagina, save the tissue so you can show it to your health care provider.  Only take over-the-counter or prescription medicines as directed by your health care provider.  Do not take aspirin because it can make you bleed.  Keep all follow-up appointments as directed by your health care provider. SEEK MEDICAL CARE IF:  You have any vaginal bleeding during any part of your pregnancy.  You have cramps or labor pains.  You have a fever, not controlled by medicine. SEEK IMMEDIATE MEDICAL CARE IF:   You have severe cramps in your back or belly (abdomen).  You pass large clots or tissue from your vagina.  Your bleeding increases.  You feel light-headed or weak, or you have fainting episodes.  You have chills.  You are leaking fluid or have a gush of fluid from your vagina.  You pass out while having a bowel movement. MAKE SURE YOU:  Understand these instructions.  Will watch your condition.  Will get help right away if you are not doing well or get worse.   This information is not  intended to replace advice given to you by your health care provider. Make sure you discuss any questions you have with your health care provider.   Document Released: 09/16/2005 Document Revised: 12/12/2013 Document Reviewed: 08/14/2013 Elsevier Interactive Patient Education [employer].

## 2016-01-07 NOTE — MAU Provider Note (Signed)
History     CSN: [id number]  Arrival date and time: 01/07/16 [id number]   First Provider Initiated Contact with Patient 01/07/16 [phone]      Chief Complaint  Patient presents with  . Abdominal Pain   HPI  Meredith York 40 y.o. [id number] presents with the complaint of having 2 menstrual cycles a month and lower abdominal pain for approximateley 2 weeks and pain around her belly button for 2 days. She reports regular bowel movements. No vaginal discharge or odor.  Past Medical History  Diagnosis Date  . Hypertension   . Diabetes in pregnancy ([place])   . Migraines   . Ovarian cyst   . Chlamydia   . Trichomonas     Past Surgical History  Procedure Laterality Date  . Cholecystectomy  02/2009  . Cesarean section  12/27/2011    Procedure: CESAREAN SECTION;  Surgeon: Shelly Bombard, MD;  Location: [facility];  Service: Gynecology;  Laterality: N/A;  Primary cesarean section with delivery of baby girl at 50. Apgars 3/3/6  . Dilation and curettage of uterus N/A 06/25/2014    Procedure: DILATATION AND CURETTAGE;  Surgeon: Donnamae Jude, MD;  Location: [facility];  Service: Gynecology;  Laterality: N/A;  . Lumbar laminectomy/decompression microdiscectomy Left 04/10/2015    Procedure: Left L5-S1 Microdiscectomy;  Surgeon: Marybelle Killings, MD;  Location: [facility];  Service: Orthopedics;  Laterality: Left;    Family History  Problem Relation Age of Onset  . Hypertension Maternal Aunt   . Hypertension Paternal Aunt   . Anesthesia problems Neg Hx   . Hypotension Neg Hx   . Malignant hyperthermia Neg Hx   . Pseudochol deficiency Neg Hx     Social History  Substance Use Topics  . Smoking status: Current Some Day Smoker -- 0.25 packs/day for 16 years    Types: Cigarettes    Last Attempt to Quit: 05/12/2011  . Smokeless tobacco: Never Used  . Alcohol Use: No    Allergies:  Allergies  Allergen Reactions  . Amoxicillin Hives and Itching    Prescriptions prior to admission  Medication Sig Dispense  Refill Last Dose  . amLODipine (NORVASC) 10 MG tablet Take 10 mg by mouth daily.   04/10/2015 at 0600  . gabapentin (NEURONTIN) 300 MG capsule Take 300 mg by mouth daily as needed. pain  0 04/10/2015 at 0600  . metFORMIN (GLUCOPHAGE) 500 MG tablet Take 500 mg by mouth 2 (two) times daily with a meal.   04/09/2015 at Unknown time  . methocarbamol (ROBAXIN) 500 MG tablet Take 1 tablet (500 mg total) by mouth every 6 (six) hours as needed for muscle spasms. 60 tablet 0   . oxyCODONE-acetaminophen (ROXICET) 5-325 MG per tablet Take 1-2 tablets by mouth every 6 (six) hours as needed for severe pain. 60 tablet 0     Review of Systems  Constitutional: Negative for fever.  Gastrointestinal: Positive for abdominal pain.   Physical Exam   Blood pressure 146/78, pulse 78, temperature 97.5 F (36.4 C), temperature source Oral, resp. rate 18, height 5' 2.5" (1.588 m), weight 101.061 kg (222 lb 12.8 oz), last menstrual period 12/16/2015, unknown if currently breastfeeding.  Physical Exam  Nursing note and vitals reviewed. Constitutional: She is oriented to person, place, and time. She appears well-developed and well-nourished.  HENT:  Head: Normocephalic and atraumatic.  Neck: Normal range of motion. Neck supple.  Cardiovascular: Normal rate.   GI: Soft. Bowel sounds are normal. She exhibits distension. She exhibits no mass.  There is no tenderness. There is no rebound and no guarding.  Musculoskeletal: Normal range of motion.  Neurological: She is alert and oriented to person, place, and time.  Skin: Skin is warm and dry.  Psychiatric: She has a normal mood and affect. Her behavior is normal. Judgment and thought content normal.   Results for orders placed or performed during the hospital encounter of 01/07/16 (from the past 24 hour(s))  Urinalysis, Routine w reflex microscopic (not at [facility])     Status: Abnormal   Collection Time: 01/07/16  8:55 AM  Result Value Ref Range   Color, Urine YELLOW  YELLOW   APPearance CLEAR CLEAR   Specific Gravity, Urine 1.025 1.005 - 1.030   pH 5.5 5.0 - 8.0   Glucose, UA NEGATIVE NEGATIVE mg/dL   Hgb urine dipstick SMALL (A) NEGATIVE   Bilirubin Urine NEGATIVE NEGATIVE   Ketones, ur NEGATIVE NEGATIVE mg/dL   Protein, ur NEGATIVE NEGATIVE mg/dL   Nitrite NEGATIVE NEGATIVE   Leukocytes, UA NEGATIVE NEGATIVE  Urine microscopic-add on     Status: Abnormal   Collection Time: 01/07/16  8:55 AM  Result Value Ref Range   Squamous Epithelial / LPF 0-5 (A) NONE SEEN   WBC, UA 0-5 0 - 5 WBC/hpf   RBC / HPF 0-5 0 - 5 RBC/hpf   Bacteria, UA RARE (A) NONE SEEN  Pregnancy, urine POC     Status: Abnormal   Collection Time: 01/07/16  9:08 AM  Result Value Ref Range   Preg Test, Ur POSITIVE (A) NEGATIVE  hCG, quantitative, pregnancy     Status: Abnormal   Collection Time: 01/07/16  9:51 AM  Result Value Ref Range   hCG, Beta Chain, Quant, S 75055 (H) <5 mIU/mL  US Ob Comp Less 14 Wks  01/07/2016  CLINICAL DATA:  Lower abdominal pain for 2 weeks EXAM: OBSTETRIC <14 WK [place] AND TRANSVAGINAL OB US TECHNIQUE: Both transabdominal and transvaginal ultrasound examinations were performed for complete evaluation of the gestation as well as the maternal uterus, adnexal regions, and pelvic cul-de-sac. Transvaginal technique was performed to assess early pregnancy. COMPARISON:  None. FINDINGS: Intrauterine gestational sac: Visualized/normal in shape. Yolk sac:  Present Embryo:  Present Cardiac Activity: Present Heart Rate: 124  bpm CRL:  8.2  mm   6 w   5 d                  [place] EDC: 08/27/2016 Subchorionic hemorrhage:  Small subchorionic hemorrhage. Maternal uterus/adnexae: Normal bilateral ovaries. Left corpus luteum cyst. Trace pelvic free fluid. IMPRESSION: 1. Single live intrauterine pregnancy dating 6 weeks 5 days. 2. Small subchorionic hemorrhage. Electronically Signed   By: Kathreen Devoid   On: 01/07/2016 11:03   US Ob Transvaginal  01/07/2016  CLINICAL DATA:  Lower  abdominal pain for 2 weeks EXAM: OBSTETRIC <14 WK [place] AND TRANSVAGINAL OB US TECHNIQUE: Both transabdominal and transvaginal ultrasound examinations were performed for complete evaluation of the gestation as well as the maternal uterus, adnexal regions, and pelvic cul-de-sac. Transvaginal technique was performed to assess early pregnancy. COMPARISON:  None. FINDINGS: Intrauterine gestational sac: Visualized/normal in shape. Yolk sac:  Present Embryo:  Present Cardiac Activity: Present Heart Rate: 124  bpm CRL:  8.2  mm   6 w   5 d                  [place] EDC: 08/27/2016 Subchorionic hemorrhage:  Small subchorionic hemorrhage. Maternal uterus/adnexae: Normal bilateral ovaries. Left corpus luteum  cyst. Trace pelvic free fluid. IMPRESSION: 1. Single live intrauterine pregnancy dating 6 weeks 5 days. 2. Small subchorionic hemorrhage. Electronically Signed   By: Kathreen Devoid   On: 01/07/2016 11:03   MAU Course  Procedures  MDM Pt is not on any hormonal birth control because of her high blood pressure.Advised to follow up with OBGYN of her choice and possible insertion of mirena IUD for her [facility]. Abdominal exam normal. Positive pregnancy test; In house u/s confirms pregnancy IUP 6w 5 d. Will give RX for Phenergan. Pt will seek prenatal care with Dr. Ruthann Cancer. Told to make appointment to begin prenatal care ASAP due to poor pregnancy history and outcome  Assessment and Plan  IUP @ 6w 5 d Abdominal pain  RX- Phenergan 25mg  q 4-6 hours prn n/v  Follow up with OBGYN of choice Discharge   Clemmons,Lori Grissett 01/07/2016, 9:28 AM

## 2016-01-07 NOTE — MAU Note (Signed)
Pt C/O lower abd pain for 2 weeks, also having pain around umbilicus that started 2 nights ago.  Having irregular periods, 2 a month.  Denies bleeding today.

## 2016-01-28 ENCOUNTER — Inpatient Hospital Stay (HOSPITAL_COMMUNITY)
Admission: AD | Admit: 2016-01-28 | Discharge: 2016-01-28 | Disposition: A | Discharge status: Home / Self Care | Payer: Medicaid Other | Source: Ambulatory Visit | Attending: Obstetrics | Admitting: Obstetrics

## 2016-01-28 DIAGNOSIS — O99331 Smoking (tobacco) complicating pregnancy, first trimester: Secondary | ICD-10-CM | POA: Diagnosis not present

## 2016-01-28 DIAGNOSIS — O219 Vomiting of pregnancy, unspecified: Secondary | ICD-10-CM | POA: Diagnosis not present

## 2016-01-28 DIAGNOSIS — I10 Essential (primary) hypertension: Secondary | ICD-10-CM | POA: Insufficient documentation

## 2016-01-28 DIAGNOSIS — Z9049 Acquired absence of other specified parts of digestive tract: Secondary | ICD-10-CM | POA: Diagnosis not present

## 2016-01-28 DIAGNOSIS — R112 Nausea with vomiting, unspecified: Secondary | ICD-10-CM | POA: Insufficient documentation

## 2016-01-28 DIAGNOSIS — Z3A01 Less than 8 weeks gestation of pregnancy: Secondary | ICD-10-CM | POA: Diagnosis not present

## 2016-01-28 DIAGNOSIS — G43909 Migraine, unspecified, not intractable, without status migrainosus: Secondary | ICD-10-CM | POA: Insufficient documentation

## 2016-01-28 DIAGNOSIS — O218 Other vomiting complicating pregnancy: Secondary | ICD-10-CM | POA: Insufficient documentation

## 2016-01-28 DIAGNOSIS — O26891 Other specified pregnancy related conditions, first trimester: Secondary | ICD-10-CM | POA: Insufficient documentation

## 2016-01-28 LAB — URINALYSIS, ROUTINE W REFLEX MICROSCOPIC
Bilirubin Urine: NEGATIVE
Glucose, UA: NEGATIVE mg/dL
Ketones, ur: 15 mg/dL — AB
Leukocytes, UA: NEGATIVE
Nitrite: POSITIVE — AB
Protein, ur: 30 mg/dL — AB
Specific Gravity, Urine: >1.03 — ABNORMAL HIGH (ref 1.005–1.030)
pH: 5.5 (ref 5.0–8.0)

## 2016-01-28 LAB — URINE MICROSCOPIC-ADD ON

## 2016-01-28 LAB — CBC
HCT: 39.5 % (ref 36.0–46.0)
Hemoglobin: 13.3 g/dL (ref 12.0–15.0)
MCH: 29.6 pg (ref 26.0–34.0)
MCHC: 33.7 g/dL (ref 30.0–36.0)
MCV: 87.8 fL (ref 78.0–100.0)
Platelets: 371 10*3/uL (ref 150–400)
RBC: 4.5 MIL/uL (ref 3.87–5.11)
RDW: 14.6 % (ref 11.5–15.5)
WBC: 13.6 10*3/uL — ABNORMAL HIGH (ref 4.0–10.5)

## 2016-01-28 MED ORDER — PROMETHAZINE HCL 25 MG PO TABS: 25.0000 mg | ORAL_TABLET | Freq: Four times a day (QID) | ORAL | Status: DC | PRN

## 2016-01-28 MED ORDER — SODIUM CHLORIDE 0.9 % IV SOLN
25.0000 mg | Freq: Once | INTRAVENOUS | Status: AC
  Administered 2016-01-28: 25 mg via INTRAVENOUS
  Filled 2016-01-28: qty 1

## 2016-01-28 NOTE — Discharge Instructions (Signed)

## 2016-01-28 NOTE — MAU Note (Addendum)
Ongoing problems with vomiting, unable to keep anything down (phenergan or BP meds either). Diarrhea started yesterday. No one else at home has it

## 2016-01-28 NOTE — MAU Provider Note (Signed)
History     CSN: [id number]  Arrival date and time: 01/28/16 1211   First Provider Initiated Contact with Patient 01/28/16 1403      Chief Complaint  Patient presents with  . Emesis During Pregnancy   HPI  Meredith York 40 y.o. [id number] [redacted]w[redacted]d presents to the MAU with Nausea and vomiting and diarrhea occasionally. No vaginal bleeding or abdominal pain noted.  Past Medical History  Diagnosis Date  . Hypertension   . Diabetes in pregnancy ([place])   . Migraines   . Ovarian cyst   . Chlamydia   . Trichomonas     Past Surgical History  Procedure Laterality Date  . Cholecystectomy  02/2009  . Cesarean section  12/27/2011    Procedure: CESAREAN SECTION;  Surgeon: Shelly Bombard, MD;  Location: [facility];  Service: Gynecology;  Laterality: N/A;  Primary cesarean section with delivery of baby girl at 96. Apgars 3/3/6  . Dilation and curettage of uterus N/A 06/25/2014    Procedure: DILATATION AND CURETTAGE;  Surgeon: Donnamae Jude, MD;  Location: [facility];  Service: Gynecology;  Laterality: N/A;  . Lumbar laminectomy/decompression microdiscectomy Left 04/10/2015    Procedure: Left L5-S1 Microdiscectomy;  Surgeon: Marybelle Killings, MD;  Location: [facility];  Service: Orthopedics;  Laterality: Left;    Family History  Problem Relation Age of Onset  . Hypertension Maternal Aunt   . Hypertension Paternal Aunt   . Anesthesia problems Neg Hx   . Hypotension Neg Hx   . Malignant hyperthermia Neg Hx   . Pseudochol deficiency Neg Hx     Social History  Substance Use Topics  . Smoking status: Current Some Day Smoker -- 0.25 packs/day for 16 years    Types: Cigarettes    Last Attempt to Quit: 05/12/2011  . Smokeless tobacco: Never Used  . Alcohol Use: No    Allergies:  Allergies  Allergen Reactions  . Amoxicillin Hives and Itching    Has patient had a PCN reaction causing immediate rash, facial/tongue/throat swelling, SOB or lightheadedness with hypotension: No Has patient had a PCN reaction  causing severe rash involving mucus membranes or skin necrosis: No Has patient had a PCN reaction that required hospitalization No Has patient had a PCN reaction occurring within the last 10 years: No If all of the above answers are "NO", then may proceed with Cephalosporin use.     Prescriptions prior to admission  Medication Sig Dispense Refill Last Dose  . amLODipine (NORVASC) 10 MG tablet Take 10 mg by mouth daily.   01/07/2016 at Unknown time  . diphenhydrAMINE (BENADRYL) 25 MG tablet Take 25 mg by mouth every 6 (six) hours as needed for sleep.   12/31/2015  . metFORMIN (GLUCOPHAGE) 500 MG tablet Take 500 mg by mouth 2 (two) times daily with a meal.   01/07/2016 at Unknown time  . promethazine (PHENERGAN) 25 MG tablet Take 1 tablet (25 mg total) by mouth every 6 (six) hours as needed for nausea or vomiting. 30 tablet 0     Review of Systems  Gastrointestinal: Positive for nausea, vomiting and diarrhea. Negative for abdominal pain.  All other systems reviewed and are negative.  Physical Exam   Blood pressure 147/83, pulse 101, temperature 98.8 F (37.1 C), temperature source Oral, resp. rate 20, height 5' 3.39" (1.61 m), weight 99.973 kg (220 lb 6.4 oz), last menstrual period 12/16/2015, SpO2 100 %, unknown if currently breastfeeding.  Physical Exam  Nursing note and vitals reviewed. Constitutional: She is  oriented to person, place, and time. She appears well-developed and well-nourished. No distress.  HENT:  Head: Normocephalic and atraumatic.  Cardiovascular: Normal rate.   Respiratory: Effort normal. No respiratory distress.  GI: Soft. She exhibits no distension. There is no tenderness.  Neurological: She is alert and oriented to person, place, and time.  Skin: Skin is warm and dry.  Psychiatric: She has a normal mood and affect. Her behavior is normal. Judgment and thought content normal.   Results for orders placed or performed during the hospital encounter of 01/28/16 (from  the past 24 hour(s))  Urinalysis, Routine w reflex microscopic (not at [facility])     Status: Abnormal   Collection Time: 01/28/16 12:40 PM  Result Value Ref Range   Color, Urine YELLOW YELLOW   APPearance CLEAR CLEAR   Specific Gravity, Urine >1.030 (H) 1.005 - 1.030   pH 5.5 5.0 - 8.0   Glucose, UA NEGATIVE NEGATIVE mg/dL   Hgb urine dipstick MODERATE (A) NEGATIVE   Bilirubin Urine NEGATIVE NEGATIVE   Ketones, ur 15 (A) NEGATIVE mg/dL   Protein, ur 30 (A) NEGATIVE mg/dL   Nitrite POSITIVE (A) NEGATIVE   Leukocytes, UA NEGATIVE NEGATIVE  Urine microscopic-add on     Status: Abnormal   Collection Time: 01/28/16 12:40 PM  Result Value Ref Range   Squamous Epithelial / LPF 0-5 (A) NONE SEEN   WBC, UA 0-5 0 - 5 WBC/hpf   RBC / HPF 0-5 0 - 5 RBC/hpf   Bacteria, UA MANY (A) NONE SEEN   Urine-Other MUCOUS PRESENT   CBC     Status: Abnormal   Collection Time: 01/28/16  3:44 PM  Result Value Ref Range   WBC 13.6 (H) 4.0 - 10.5 K/uL   RBC 4.50 3.87 - 5.11 MIL/uL   Hemoglobin 13.3 12.0 - 15.0 g/dL   HCT 39.5 36.0 - 46.0 %   MCV 87.8 78.0 - 100.0 fL   MCH 29.6 26.0 - 34.0 pg   MCHC 33.7 30.0 - 36.0 g/dL   RDW 14.6 11.5 - 15.5 %   Platelets 371 150 - 400 K/uL   MAU Course  Procedures  MDM IVF and antiemetic, pending cbc  Assessment and Plan  Nausea and vomiting first trimester Phenergan 25mg  PO #30 Follow up with Dr Ruthann Cancer Discharge  Clemmons,Lori Grissett 01/28/2016, 3:20 PM

## 2016-01-30 LAB — CULTURE, OB URINE: Culture: >=100000

## 2016-01-31 ENCOUNTER — Telehealth: Payer: Self-pay | Admitting: Physician Assistant

## 2016-01-31 MED ORDER — CEPHALEXIN 500 MG PO CAPS: 500.0000 mg | ORAL_CAPSULE | Freq: Three times a day (TID) | ORAL | Status: DC

## 2016-01-31 NOTE — Telephone Encounter (Signed)
E coli on ob urine culture.  Pt with listed allergy to pcn but answered no severe rxn and no cross-reactivity expected.  Keflex rx sent.   Pt called and LM.

## 2016-02-06 LAB — OB RESULTS CONSOLE HIV ANTIBODY (ROUTINE TESTING): HIV: NONREACTIVE

## 2016-02-06 LAB — OB RESULTS CONSOLE ABO/RH: RH TYPE: POSITIVE

## 2016-02-06 LAB — OB RESULTS CONSOLE RUBELLA ANTIBODY, IGM: RUBELLA: IMMUNE

## 2016-02-06 LAB — OB RESULTS CONSOLE GC/CHLAMYDIA: Gonorrhea: NEGATIVE

## 2016-02-06 LAB — OB RESULTS CONSOLE HGB/HCT, BLOOD
HCT: 35 %
HEMOGLOBIN: 11.7 g/dL

## 2016-02-06 LAB — OB RESULTS CONSOLE GBS: GBS: NEGATIVE

## 2016-02-06 LAB — OB RESULTS CONSOLE RPR: RPR: NONREACTIVE

## 2016-02-06 LAB — OB RESULTS CONSOLE HEPATITIS B SURFACE ANTIGEN: HEP B S AG: NEGATIVE

## 2016-02-06 LAB — OB RESULTS CONSOLE ANTIBODY SCREEN: ANTIBODY SCREEN: NEGATIVE

## 2016-02-14 ENCOUNTER — Inpatient Hospital Stay (HOSPITAL_COMMUNITY)
Admission: AD | Admit: 2016-02-14 | Discharge: 2016-02-14 | Disposition: A | Discharge status: Home / Self Care | Payer: Medicaid Other | Source: Ambulatory Visit | Attending: Obstetrics | Admitting: Obstetrics

## 2016-02-14 ENCOUNTER — Inpatient Hospital Stay (HOSPITAL_COMMUNITY): Payer: Medicaid Other

## 2016-02-14 ENCOUNTER — Encounter (HOSPITAL_COMMUNITY): Payer: Self-pay | Admitting: *Deleted

## 2016-02-14 DIAGNOSIS — Z3A12 12 weeks gestation of pregnancy: Secondary | ICD-10-CM | POA: Diagnosis not present

## 2016-02-14 DIAGNOSIS — Z880 Allergy status to penicillin: Secondary | ICD-10-CM | POA: Diagnosis not present

## 2016-02-14 DIAGNOSIS — O209 Hemorrhage in early pregnancy, unspecified: Secondary | ICD-10-CM

## 2016-02-14 DIAGNOSIS — Z7984 Long term (current) use of oral hypoglycemic drugs: Secondary | ICD-10-CM | POA: Insufficient documentation

## 2016-02-14 DIAGNOSIS — F17210 Nicotine dependence, cigarettes, uncomplicated: Secondary | ICD-10-CM | POA: Diagnosis not present

## 2016-02-14 DIAGNOSIS — IMO0002 Reserved for concepts with insufficient information to code with codable children: Secondary | ICD-10-CM

## 2016-02-14 DIAGNOSIS — O4691 Antepartum hemorrhage, unspecified, first trimester: Secondary | ICD-10-CM | POA: Diagnosis not present

## 2016-02-14 DIAGNOSIS — O99331 Smoking (tobacco) complicating pregnancy, first trimester: Secondary | ICD-10-CM | POA: Diagnosis not present

## 2016-02-14 DIAGNOSIS — O418X10 Other specified disorders of amniotic fluid and membranes, first trimester, not applicable or unspecified: Secondary | ICD-10-CM

## 2016-02-14 DIAGNOSIS — E119 Type 2 diabetes mellitus without complications: Secondary | ICD-10-CM | POA: Insufficient documentation

## 2016-02-14 DIAGNOSIS — O24111 Pre-existing diabetes mellitus, type 2, in pregnancy, first trimester: Secondary | ICD-10-CM | POA: Diagnosis not present

## 2016-02-14 DIAGNOSIS — O208 Other hemorrhage in early pregnancy: Secondary | ICD-10-CM | POA: Insufficient documentation

## 2016-02-14 DIAGNOSIS — O468X1 Other antepartum hemorrhage, first trimester: Secondary | ICD-10-CM

## 2016-02-14 LAB — URINALYSIS, ROUTINE W REFLEX MICROSCOPIC
BILIRUBIN URINE: NEGATIVE
Glucose, UA: NEGATIVE mg/dL
Ketones, ur: 15 mg/dL — AB
LEUKOCYTES UA: NEGATIVE
NITRITE: NEGATIVE
PH: 5.5 (ref 5.0–8.0)
Protein, ur: NEGATIVE mg/dL
SPECIFIC GRAVITY, URINE: 1.02 (ref 1.005–1.030)

## 2016-02-14 LAB — URINE MICROSCOPIC-ADD ON

## 2016-02-14 NOTE — MAU Provider Note (Signed)
History     CSN: [id number]  Arrival date and time: 02/14/16 1317   First Provider Initiated Contact with Patient 02/14/16 1623       Chief Complaint  Patient presents with  . Vaginal Bleeding   HPI  Meredith York is a 40 y.o. [id number] at [redacted]w[redacted]d who presents for vaginal bleeding.  This morning passed small brown clot & has noted brown spotting on toilet paper since then. Also reports some lower back cramping that is mild since this morning. Denies recent intercourse. Denies n/v/d. Reports some issues with constipation. Last BM was last night and she had to strain for that BM.   OB History    Gravida Para Term Preterm AB TAB SAB Ectopic Multiple Living   6 3 3  1 1    2       Past Medical History  Diagnosis Date  . Hypertension   . Diabetes in pregnancy ([place])   . Migraines   . Ovarian cyst   . Chlamydia   . Trichomonas     Past Surgical History  Procedure Laterality Date  . Cholecystectomy  02/2009  . Cesarean section  12/27/2011    Procedure: CESAREAN SECTION;  Surgeon: Shelly Bombard, MD;  Location: [facility];  Service: Gynecology;  Laterality: N/A;  Primary cesarean section with delivery of baby girl at 23. Apgars 3/3/6  . Dilation and curettage of uterus N/A 06/25/2014    Procedure: DILATATION AND CURETTAGE;  Surgeon: Donnamae Jude, MD;  Location: [facility];  Service: Gynecology;  Laterality: N/A;  . Lumbar laminectomy/decompression microdiscectomy Left 04/10/2015    Procedure: Left L5-S1 Microdiscectomy;  Surgeon: Marybelle Killings, MD;  Location: [facility];  Service: Orthopedics;  Laterality: Left;    Family History  Problem Relation Age of Onset  . Hypertension Maternal Aunt   . Hypertension Paternal Aunt   . Anesthesia problems Neg Hx   . Hypotension Neg Hx   . Malignant hyperthermia Neg Hx   . Pseudochol deficiency Neg Hx     Social History  Substance Use Topics  . Smoking status: Current Some Day Smoker -- 0.25 packs/day for 16 years    Types: Cigarettes    Last  Attempt to Quit: 05/12/2011  . Smokeless tobacco: Never Used  . Alcohol Use: No    Allergies:  Allergies  Allergen Reactions  . Amoxicillin Hives and Itching    Has patient had a PCN reaction causing immediate rash, facial/tongue/throat swelling, SOB or lightheadedness with hypotension: No Has patient had a PCN reaction causing severe rash involving mucus membranes or skin necrosis: No Has patient had a PCN reaction that required hospitalization No Has patient had a PCN reaction occurring within the last 10 years: No If all of the above answers are "NO", then may proceed with Cephalosporin use.     Prescriptions prior to admission  Medication Sig Dispense Refill Last Dose  . cephALEXin (KEFLEX) 500 MG capsule Take 1 capsule (500 mg total) by mouth 3 (three) times daily. 15 capsule 0   . diphenhydrAMINE (BENADRYL) 25 MG tablet Take 25 mg by mouth every 6 (six) hours as needed for sleep.   12/31/2015  . metFORMIN (GLUCOPHAGE) 500 MG tablet Take 500 mg by mouth 2 (two) times daily with a meal.   01/07/2016 at Unknown time  . promethazine (PHENERGAN) 25 MG tablet Take 1 tablet (25 mg total) by mouth every 6 (six) hours as needed for nausea or vomiting. 30 tablet 0   . promethazine (  PHENERGAN) 25 MG tablet Take 1 tablet (25 mg total) by mouth every 6 (six) hours as needed for nausea or vomiting. 30 tablet 0     Review of Systems  Constitutional: Negative.   Gastrointestinal: Positive for constipation. Negative for nausea, vomiting, abdominal pain and diarrhea.  Genitourinary: Negative for dysuria.       + vaginal bleeding (brown spotting)  Musculoskeletal: Positive for back pain.   Physical Exam   Blood pressure 135/78, pulse 93, temperature 98.7 F (37.1 C), temperature source Oral, resp. rate 18, last menstrual period 12/16/2015, unknown if currently breastfeeding.  Physical Exam  Nursing note and vitals reviewed. Constitutional: She is oriented to person, place, and time. She  appears well-developed and well-nourished. No distress.  HENT:  Head: Normocephalic and atraumatic.  Eyes: Conjunctivae are normal. Right eye exhibits no discharge. Left eye exhibits no discharge. No scleral icterus.  Neck: Normal range of motion.  Cardiovascular: Normal rate.   Respiratory: Effort normal. No respiratory distress.  GI: Soft. She exhibits no distension. There is no tenderness.  Genitourinary:  Cervix closed Small amount of brown discharge. No active bleeding  Neurological: She is alert and oriented to person, place, and time.  Skin: Skin is warm and dry. She is not diaphoretic.  Psychiatric: She has a normal mood and affect. Her behavior is normal. Judgment and thought content normal.    MAU Course  Procedures Results for orders placed or performed during the hospital encounter of 02/14/16 (from the past 24 hour(s))  Urinalysis, Routine w reflex microscopic (not at [facility])     Status: Abnormal   Collection Time: 02/14/16  1:31 PM  Result Value Ref Range   Color, Urine YELLOW YELLOW   APPearance CLEAR CLEAR   Specific Gravity, Urine 1.020 1.005 - 1.030   pH 5.5 5.0 - 8.0   Glucose, UA NEGATIVE NEGATIVE mg/dL   Hgb urine dipstick MODERATE (A) NEGATIVE   Bilirubin Urine NEGATIVE NEGATIVE   Ketones, ur 15 (A) NEGATIVE mg/dL   Protein, ur NEGATIVE NEGATIVE mg/dL   Nitrite NEGATIVE NEGATIVE   Leukocytes, UA NEGATIVE NEGATIVE  Urine microscopic-add on     Status: Abnormal   Collection Time: 02/14/16  1:31 PM  Result Value Ref Range   Squamous Epithelial / LPF 0-5 (A) NONE SEEN   WBC, UA 0-5 0 - 5 WBC/hpf   RBC / HPF 0-5 0 - 5 RBC/hpf   Bacteria, UA RARE (A) NONE SEEN   US Ob Comp Less 14 Wks  02/14/2016  CLINICAL DATA:  Cramping and spotting today. Fetal heart tones not heard. Estimated gestational age by a previous ultrasound of 12 weeks and 1 day. First trimester ultrasound. EXAM: OBSTETRIC <14 WK ULTRASOUND TECHNIQUE: Transabdominal ultrasound was performed for  evaluation of the gestation as well as the maternal uterus and adnexal regions. COMPARISON:  01/07/2016 FINDINGS: Intrauterine gestational sac: Visualized/normal in shape. Yolk sac:  Present Embryo:  Present Cardiac Activity: Present Heart Rate: 162 bpm MSD:   mm    w     d CRL:   60.8  Mm   12 w 4 d                  [place] EDC: 08/24/2016 Subchorionic hemorrhage: The small subchorionic hemorrhages slightly decreased in size from the prior exam. Maternal uterus/adnexae: Within normal limits IMPRESSION: Single intrauterine gestation with normal expected growth since the prior exam. Interval decrease in size of a small subchorionic hemorrhage. Electronically Signed   By: Harrell Gave  Mattern M.D.   On: 02/14/2016 16:14    MDM O positive Cervix closed Unable to doppler FHT, likely d/t body habitus; ultrasound ordered Ultrasound - IUP with cardiac activity. Also shows small [facility] that has decreased in size since previous u/s.  Assessment and Plan  A: 1. Subchorionic hematoma in first trimester   2. Fetal heart tones not heard   3. [redacted] weeks gestation of pregnancy   4. Vaginal bleeding in pregnancy, first trimester     P: Discharge home Increase water & fiber intake; take OTC stool softener Discussed reasons to return to MAU Keep f/u appt with OB  Jorje Guild 02/14/2016, 2:45 PM

## 2016-02-14 NOTE — Discharge Instructions (Signed)
Subchorionic Hematoma °A subchorionic hematoma is a gathering of blood between the outer wall of the placenta and the inner wall of the womb (uterus). The placenta is the organ that connects the fetus to the wall of the uterus. The placenta performs the feeding, breathing (oxygen to the fetus), and waste removal (excretory work) of the fetus.  °Subchorionic hematoma is the most common abnormality found on a result from ultrasonography done during the first trimester or early second trimester of pregnancy. If there has been little or no vaginal bleeding, early small hematomas usually shrink on their own and do not affect your baby or pregnancy. The blood is gradually absorbed over 1-2 weeks. When bleeding starts later in pregnancy or the hematoma is larger or occurs in an older pregnant woman, the outcome may not be as good. Larger hematomas may get bigger, which increases the chances for miscarriage. Subchorionic hematoma also increases the risk of premature detachment of the placenta from the uterus, preterm (premature) labor, and stillbirth. °HOME CARE INSTRUCTIONS °· Stay on bed rest if your health care provider recommends this. Although bed rest will not prevent more bleeding or prevent a miscarriage, your health care provider may recommend bed rest until you are advised otherwise. °· Avoid heavy lifting (more than 10 lb [4.5 kg]), exercise, sexual intercourse, or douching as directed by your health care provider. °· Keep track of the number of pads you use each day and how soaked (saturated) they are. Write down this information. °· Do not use tampons. °· Keep all follow-up appointments as directed by your health care provider. Your health care provider may ask you to have follow-up blood tests or ultrasound tests or both. °SEEK IMMEDIATE MEDICAL CARE IF: °· You have severe cramps in your stomach, back, abdomen, or pelvis. °· You have a fever. °· You pass large clots or tissue. Save any tissue for your health  care provider to look at. °· Your bleeding increases or you become lightheaded, feel weak, or have fainting episodes. °  °This information is not intended to replace advice given to you by your health care provider. Make sure you discuss any questions you have with your health care provider. °  °Document Released: 03/24/2007 Document Revised: 12/28/2014 Document Reviewed: 07/06/2013 °Elsevier Interactive Patient Education ©2016 Elsevier Inc[employer] ° ° ° °Constipation, Adult °Constipation is when a person has fewer than three bowel movements a week, has difficulty having a bowel movement, or has stools that are dry, hard, or larger than normal. As people grow older, constipation is more common. A low-fiber diet, not taking in enough fluids, and taking certain medicines may make constipation worse.  °CAUSES  °· Certain medicines, such as antidepressants, pain medicine, iron supplements, antacids, and water pills.   °· Certain diseases, such as diabetes, irritable bowel syndrome (IBS), thyroid disease, or depression.   °· Not drinking enough water.   °· Not eating enough fiber-rich foods.   °· Stress or travel.   °· Lack of physical activity or exercise.   °· Ignoring the urge to have a bowel movement.   °· Using laxatives too much.   °SIGNS AND SYMPTOMS  °· Having fewer than three bowel movements a week.   °· Straining to have a bowel movement.   °· Having stools that are hard, dry, or larger than normal.   °· Feeling full or bloated.   °· Pain in the lower abdomen.   °· Not feeling relief after having a bowel movement.   °DIAGNOSIS  °Your health care provider will take a medical history and perform a physical   exam. Further testing may be done for severe constipation. Some tests may include: °· A barium enema X-ray to examine your rectum, colon, and, sometimes, your small intestine.   °· A sigmoidoscopy to examine your lower colon.   °· A colonoscopy to examine your entire colon. °TREATMENT  °Treatment will depend on the  severity of your constipation and what is causing it. Some dietary treatments include drinking more fluids and eating more fiber-rich foods. Lifestyle treatments may include regular exercise. If these diet and lifestyle recommendations do not help, your health care provider may recommend taking over-the-counter laxative medicines to help you have bowel movements. Prescription medicines may be prescribed if over-the-counter medicines do not work.  °HOME CARE INSTRUCTIONS  °· Eat foods that have a lot of fiber, such as fruits, vegetables, whole grains, and beans. °· Limit foods high in fat and processed sugars, such as french fries, hamburgers, cookies, candies, and soda.   °· A fiber supplement may be added to your diet if you cannot get enough fiber from foods.   °· Drink enough fluids to keep your urine clear or pale yellow.   °· Exercise regularly or as directed by your health care provider.   °· Go to the restroom when you have the urge to go. Do not hold it.   °· Only take over-the-counter or prescription medicines as directed by your health care provider. Do not take other medicines for constipation without talking to your health care provider first.   °SEEK IMMEDIATE MEDICAL CARE IF:  °· You have bright red blood in your stool.   °· Your constipation lasts for more than 4 days or gets worse.   °· You have abdominal or rectal pain.   °· You have thin, pencil-like stools.   °· You have unexplained weight loss. °MAKE SURE YOU:  °· Understand these instructions. °· Will watch your condition. °· Will get help right away if you are not doing well or get worse. °  °This information is not intended to replace advice given to you by your health care provider. Make sure you discuss any questions you have with your health care provider. °  °Document Released: 09/04/2004 Document Revised: 12/28/2014 Document Reviewed: 09/18/2013 °Elsevier Interactive Patient Education ©2016 Elsevier Inc. ° °

## 2016-02-14 NOTE — MAU Note (Addendum)
Spotting, passed a small blood clot. Mild cramping in lower abdomen and back. Known small subchorionic hemorrhage per U/S.

## 2016-02-18 ENCOUNTER — Ambulatory Visit (HOSPITAL_COMMUNITY): Admission: RE | Admit: 2016-02-18 | Payer: Medicaid Other | Source: Ambulatory Visit

## 2016-02-18 ENCOUNTER — Ambulatory Visit (HOSPITAL_COMMUNITY)
Admission: RE | Admit: 2016-02-18 | Discharge: 2016-02-18 | Disposition: A | Discharge status: Home / Self Care | Payer: Medicaid Other | Source: Ambulatory Visit | Attending: Obstetrics | Admitting: Obstetrics

## 2016-02-18 ENCOUNTER — Other Ambulatory Visit (HOSPITAL_COMMUNITY): Payer: Self-pay | Admitting: Obstetrics

## 2016-02-18 DIAGNOSIS — O208 Other hemorrhage in early pregnancy: Secondary | ICD-10-CM | POA: Insufficient documentation

## 2016-02-18 DIAGNOSIS — IMO0002 Reserved for concepts with insufficient information to code with codable children: Secondary | ICD-10-CM

## 2016-02-18 DIAGNOSIS — Z3A13 13 weeks gestation of pregnancy: Secondary | ICD-10-CM | POA: Diagnosis not present

## 2016-03-03 ENCOUNTER — Inpatient Hospital Stay (HOSPITAL_COMMUNITY)
Admission: AD | Admit: 2016-03-03 | Discharge: 2016-03-03 | Disposition: A | Discharge status: Home / Self Care | Payer: Medicaid Other | Source: Ambulatory Visit | Attending: Obstetrics | Admitting: Obstetrics

## 2016-03-03 ENCOUNTER — Encounter (HOSPITAL_COMMUNITY): Payer: Self-pay | Admitting: *Deleted

## 2016-03-03 DIAGNOSIS — Z7984 Long term (current) use of oral hypoglycemic drugs: Secondary | ICD-10-CM | POA: Insufficient documentation

## 2016-03-03 DIAGNOSIS — Z3492 Encounter for supervision of normal pregnancy, unspecified, second trimester: Secondary | ICD-10-CM

## 2016-03-03 DIAGNOSIS — O24112 Pre-existing diabetes mellitus, type 2, in pregnancy, second trimester: Secondary | ICD-10-CM | POA: Diagnosis not present

## 2016-03-03 DIAGNOSIS — E119 Type 2 diabetes mellitus without complications: Secondary | ICD-10-CM | POA: Insufficient documentation

## 2016-03-03 DIAGNOSIS — Z880 Allergy status to penicillin: Secondary | ICD-10-CM | POA: Diagnosis not present

## 2016-03-03 DIAGNOSIS — IMO0002 Reserved for concepts with insufficient information to code with codable children: Secondary | ICD-10-CM

## 2016-03-03 DIAGNOSIS — Z3A15 15 weeks gestation of pregnancy: Secondary | ICD-10-CM | POA: Insufficient documentation

## 2016-03-03 DIAGNOSIS — Z87891 Personal history of nicotine dependence: Secondary | ICD-10-CM | POA: Insufficient documentation

## 2016-03-03 DIAGNOSIS — O289 Unspecified abnormal findings on antenatal screening of mother: Secondary | ICD-10-CM | POA: Diagnosis not present

## 2016-03-03 DIAGNOSIS — O10012 Pre-existing essential hypertension complicating pregnancy, second trimester: Secondary | ICD-10-CM | POA: Insufficient documentation

## 2016-03-03 NOTE — MAU Provider Note (Signed)
Chief Complaint: No chief complaint on file.   First Provider Initiated Contact with Patient 03/03/16 1434     SUBJECTIVE HPI: Meredith York is a 40 y.o. [id number] at [redacted]w[redacted]d who was sent to [facility] because her provider was unable to auscultate fetal heart tones by Doppler in the office prenatal visit today.  Associated signs and symptoms: Negative for abdominal pain, vaginal bleeding or leaking of fluid. He has not yet experienced quickening.  Past Medical History  Diagnosis Date  . Hypertension   . Diabetes in pregnancy ([place])   . Migraines   . Ovarian cyst   . Chlamydia   . Trichomonas    OB History  Gravida Para Term Preterm AB SAB TAB Ectopic Multiple Living  6 3 3  1  1   2     # Outcome Date GA Lbr Len/2nd Weight Sex Delivery Anes PTL Lv  6 Current           5 Term 12/27/11 [redacted]w[redacted]d    CS-LTranv Spinal    4 Term 04/03/07 [redacted]w[redacted]d  8 lb 15 oz (4.054 kg) F Vag-Spont EPI N Y  3 Term 05/14/97 [redacted]w[redacted]d  7 lb 12 oz (3.515 kg) F Vag-Spont EPI N Y  2 Gravida           1 TAB              Past Surgical History  Procedure Laterality Date  . Cholecystectomy  02/2009  . Cesarean section  12/27/2011    Procedure: CESAREAN SECTION;  Surgeon: Shelly Bombard, MD;  Location: [facility];  Service: Gynecology;  Laterality: N/A;  Primary cesarean section with delivery of baby girl at 34. Apgars 3/3/6  . Dilation and curettage of uterus N/A 06/25/2014    Procedure: DILATATION AND CURETTAGE;  Surgeon: Donnamae Jude, MD;  Location: [facility];  Service: Gynecology;  Laterality: N/A;  . Lumbar laminectomy/decompression microdiscectomy Left 04/10/2015    Procedure: Left L5-S1 Microdiscectomy;  Surgeon: Marybelle Killings, MD;  Location: [facility];  Service: Orthopedics;  Laterality: Left;  . Back surgery     Social History   Social History  . Marital Status: Single    Spouse Name: N/A  . Number of Children: N/A  . Years of Education: N/A   Occupational History  . Not on file.   Social History Main Topics   . Smoking status: Former Smoker -- 0.25 packs/day for 16 years    Types: Cigarettes    Quit date: 12/22/2015  . Smokeless tobacco: Never Used  . Alcohol Use: No  . Drug Use: Yes    Special: Marijuana  . Sexual Activity: Yes   Other Topics Concern  . Not on file   Social History Narrative   No current facility-administered medications on file prior to encounter.   Current Outpatient Prescriptions on File Prior to Encounter  Medication Sig Dispense Refill  . diphenhydrAMINE (BENADRYL) 25 MG tablet Take 25 mg by mouth every 6 (six) hours as needed for sleep.    . metFORMIN (GLUCOPHAGE) 500 MG tablet Take 500 mg by mouth every evening.     . cephALEXin (KEFLEX) 500 MG capsule Take 1 capsule (500 mg total) by mouth 3 (three) times daily. (Patient not taking: Reported on 03/03/2016) 15 capsule 0  . promethazine (PHENERGAN) 25 MG tablet Take 1 tablet (25 mg total) by mouth every 6 (six) hours as needed for nausea or vomiting. (Patient not taking: Reported on 03/03/2016) 30 tablet 0  Allergies  Allergen Reactions  . Amoxicillin Hives and Itching    Has patient had a PCN reaction causing immediate rash, facial/tongue/throat swelling, SOB or lightheadedness with hypotension: No Has patient had a PCN reaction causing severe rash involving mucus membranes or skin necrosis: No Has patient had a PCN reaction that required hospitalization No Has patient had a PCN reaction occurring within the last 10 years: No If all of the above answers are "NO", then may proceed with Cephalosporin use.     I have reviewed the past Medical Hx, Surgical Hx, Social Hx, Allergies and Medications.   Review of Systems  Gastrointestinal: Negative for abdominal pain.  Genitourinary: Negative for vaginal bleeding.    OBJECTIVE Patient Vitals for the past 24 hrs:  BP Temp Temp src Pulse Resp  03/03/16 1401 128/76 mmHg 98.4 F (36.9 C) Oral 96 18   Constitutional: Well-developed, well-nourished Morbidly obese  female in no acute distress.  Cardiovascular: normal rate Respiratory: normal rate and effort.  GI: Abd soft, non-tender, unable to palpate fundus possibly due to maternal body habitus. Neurologic: Alert and oriented x 4.   LAB RESULTS No results found for this or any previous visit (from the past 24 hour(s)).  IMAGING Informal BS US shows FHR 154. Active fetus.   MAU COURSE/MDM - 40 year-old female at 15.[redacted] weeks gestation with FHR seen on [place].   ASSESSMENT 1. Fetal heart tones not heard   2. Fetal heart tones present, second trimester     PLAN Discharge home in stable condition. Second trimester Precautions Follow-up Information    Follow up with MARSHALL,BERNARD A, MD In 2 weeks.   Specialty:  Obstetrics and Gynecology   Why:  Routine prenatal visit   Contact information:   [address] [place] [address] [phone]       Follow up with [facility].   Why:  As needed in emergencies   Contact information:   [address] [id number] [place] [phone]       Medication List    STOP taking these medications        cephALEXin 500 MG capsule  Commonly known as:  KEFLEX     promethazine 25 MG tablet  Commonly known as:  PHENERGAN      TAKE these medications        amLODipine 10 MG tablet  Commonly known as:  NORVASC  Take 10 mg by mouth daily.     diphenhydrAMINE 25 MG tablet  Commonly known as:  BENADRYL  Take 25 mg by mouth every 6 (six) hours as needed for sleep.     labetalol 200 MG tablet  Commonly known as:  NORMODYNE  Take 1 tablet by mouth 3 (three) times daily.     metFORMIN 500 MG tablet  Commonly known as:  GLUCOPHAGE  Take 500 mg by mouth every evening.     prenatal multivitamin Tabs tablet  Take 1 tablet by mouth daily at 12 noon.         [place], [place] 03/03/2016  2:46 PM

## 2016-03-03 NOTE — Discharge Instructions (Signed)

## 2016-03-03 NOTE — MAU Note (Signed)
Pt states she saw Dr. Ruthann Cancer this morning, he could not hear FHT's, was told to come to MAU.  Pt denies pain or bleeding.

## 2016-03-12 ENCOUNTER — Encounter: Payer: Medicaid Other | Attending: Obstetrics | Admitting: *Deleted

## 2016-03-12 ENCOUNTER — Ambulatory Visit (HOSPITAL_COMMUNITY)
Admission: RE | Admit: 2016-03-12 | Discharge: 2016-03-12 | Disposition: A | Discharge status: Home / Self Care | Payer: Medicaid Other | Source: Ambulatory Visit | Attending: Obstetrics | Admitting: Obstetrics

## 2016-03-12 ENCOUNTER — Other Ambulatory Visit (HOSPITAL_COMMUNITY): Payer: Self-pay | Admitting: *Deleted

## 2016-03-12 ENCOUNTER — Encounter (HOSPITAL_COMMUNITY): Payer: Self-pay

## 2016-03-12 DIAGNOSIS — Z029 Encounter for administrative examinations, unspecified: Secondary | ICD-10-CM | POA: Diagnosis not present

## 2016-03-12 DIAGNOSIS — O09529 Supervision of elderly multigravida, unspecified trimester: Secondary | ICD-10-CM

## 2016-03-12 DIAGNOSIS — O24119 Pre-existing diabetes mellitus, type 2, in pregnancy, unspecified trimester: Secondary | ICD-10-CM

## 2016-03-12 DIAGNOSIS — O09522 Supervision of elderly multigravida, second trimester: Secondary | ICD-10-CM

## 2016-03-12 NOTE — Progress Notes (Signed)
Genetic Counseling  High-Risk Gestation Note  Appointment Date:  03/12/2016 Referred By: Frederico Hamman, MD Date of Birth:  1976/01/14   Pregnancy History: [id number] Estimated Date of Delivery: 08/24/16 Estimated Gestational Age: [redacted]w[redacted]d Attending: Renella Cunas, MD   [name] was seen for genetic counseling because of a maternal age of 40 y.o..     In Summary:   Reviewed 1 in 52 risk for fetal aneuploidy related to maternal age  Patient declined Quad screen, NIPS, and amniocentesis  Detailed ultrasound scheduled for 04/02/16  Family history significant for patient's oldest daughter (different father from current pregnancy) with bilateral club foot, preaxial polydactyly of the hand, Hirschsprung disease, and intellectual disability  No underlying etiology is known; Patient reported her daughter has been seen by [facility] in the past and testing was within normal limits  Reviewed option of detailed ultrasound to assess fetal anatomy, but genetic testing not available without known genetic etiology  Patient and partner had a daughter in 04/10/12 who died at 9 days of age from reportedly pulmonary hypertension  Patient's medical history significant for diabetes and hypertension  Diabetic education today  MFM consult scheduled for 03/17/16  She was counseled regarding maternal age and the association with risk for chromosome conditions due to nondisjunction with aging of the ova.   We reviewed chromosomes, nondisjunction, and the associated 1 in 67 risk for fetal aneuploidy related to a maternal age of 40 y.o. at [redacted]w[redacted]d gestation.  She was counseled that the risk for aneuploidy decreases as gestational age increases, accounting for those pregnancies which spontaneously abort.  We specifically discussed Down syndrome (trisomy 39), trisomies 14 and 31, and sex chromosome aneuploidies (47,XXX and 47,XXY) including the common features and prognoses of each.   We reviewed  available screening options including Quad screen, noninvasive prenatal screening (NIPS)/cell free DNA (cfDNA) testing, and detailed ultrasound.  She was counseled that screening tests are used to modify a patient's a priori risk for aneuploidy, typically based on age. This estimate provides a pregnancy specific risk assessment. We reviewed the benefits and limitations of each option. Specifically, we discussed the conditions for which each test screens, the detection rates, and false positive rates of each. She was also counseled regarding diagnostic testing via amniocentesis. We reviewed the approximate 1 in [id number] risk for complications for amniocentesis, including spontaneous pregnancy loss. After consideration of all the options, she elected to proceed with detailed ultrasound only, which is scheduled for 04/02/16. She declined additional screening or testing for fetal aneuploidy, including Quad screen, NIPS, and amniocentesis, given that she stated identifying an underlying chromosome condition in the pregnancy would potentially cause her increased worry. She understands that screening tests cannot rule out all birth defects or genetic syndromes. The patient was advised of this limitation and states she still does not want additional testing at this time.   [name] was provided with written information regarding sickle cell anemia (SCA) including the carrier frequency and incidence in the Hispanic/African-American population, the availability of carrier testing and prenatal diagnosis if indicated.  In addition, we discussed that hemoglobinopathies are routinely screened for as part of the [place] newborn screening panel.  She reported that this was previously performed and was within normal range. Hemoglobin electrophoresis is available to screen for additional hemoglobin variants, in addition to hemoglobin S, if not previously performed and if desired.   Both family histories were reviewed and found  to be contributory for multiple anomalies and intellectual disability for Ms.  Meredith York's first child, a daughter from a previous partner. She reported that her daughter is 28 years old and was born with bilateral club foot, unilateral preaxial polydactyly of the hand, and Hirschsprung disease. She also was described to have intellectual disability. Meredith York reported that her daughter had surgical correction for club foot and Hirschsprung disease. She graduated from high school and is currently working. However, Meredith York is unsure if her daughter will be able to live independently. She has been evaluated by medical genetics through [facility], and Meredith York reported that genetic testing has been normal. No underlying cause is known for her daughter's features. We discussed that each of the features can occur separately or due to a single underlying etiology. There is increased suspicion for an underlying etiology with an increased number of medical concerns. However, we reviewed that knowledge of genetic causes for conditions can change over time and that while genetic testing is not exhaustive, there may be new genetic testing available over time. We reviewed examples of patterns of inheritance for genetic etiologies. We also discussed sporadic inheritance, environmental, and multifactorial causes. Given the reported family history and that the current pregnancy has a different father, we discussed that recurrence risk would likely be low but likely increased above the general population risk. We discussed that availability of detailed ultrasound to assess fetal anatomy and to screen for club foot and polydactyly. She understands that ultrasound does not diagnose or rule out all birth defects or genetic conditions. In the absence of an identified etiology, recurrence risk cannot accurately be assessed. Without further information regarding the provided family history, an accurate genetic risk cannot be  calculated. Further genetic counseling is warranted if more information is obtained.  Meredith York denied exposure to environmental toxins or chemical agents. She denied the use of alcohol, tobacco or street drugs. She denied significant viral illnesses during the course of her pregnancy. Her medical and surgical histories were contributory for diabetes and hypertension, for which she is treated with medication. Meredith York was seen for diabetes education today, which is documented separately. Meredith York also reported that her daughter in 2012/04/20 died at 58 days of age from pulmonary hypertension. MFM consultation is scheduled for 03/17/16.    I counseled Meredith York regarding the above risks and available options.  The approximate face-to-face time with the genetic counselor was 40 minutes.   Chipper Oman, MS,  Certified [employer] 03/12/2016

## 2016-03-12 NOTE — Progress Notes (Signed)
  Patient was seen on 4/46/52 for E7OL complicated by pregnancy . She is presently on Metformin ER 557m with dinner. Her present FBS range 86-132 and 2hpp range 88-134mdl. In consult with Dr. DeBurnett Harrye will discontinue the Metformin. RX Glyburide 1.2528mith dinner. She will be seen on 03/17/16 for MD Consult and evaluation of glucose readings and potential medication adjustment. I will then see her again on 03/26/16 for further f/U as we co-manage with Dr. MarRuthann Cancer The following learning objectives were met by the patient :   States why dietary management is important in controlling blood glucose  Describes the effects of carbohydrates on blood glucose levels  Demonstrates ability to create a balanced meal plan  Demonstrates carbohydrate counting   States when to check blood glucose levels  Demonstrates proper blood glucose monitoring techniques  States the effect of stress and exercise on blood glucose levels  States the importance of limiting caffeine and abstaining from alcohol and smoking  Plan:  Aim for 2 Carb Choices per meal (30 grams) +/- 1 either way for breakfast Aim for 3 Carb Choices per meal (45 grams) +/- 1 either way from lunch and dinner Aim for 1-2 Carbs per snack Begin reading food labels for Total Carbohydrate and sugar grams of foods Consider  increasing your activity level by walking daily as tolerated Begin checking BG before breakfast and 1-2 hours after first bit of breakfast, lunch and dinner after  as directed by MD  Take medication  as directed by MD  Patient instructed to monitor glucose levels: FBS: 60 - <90 2 hour: <120  Patient received the following handouts:  Nutrition Diabetes and Pregnancy  Carbohydrate Counting List  Meal Planning worksheet  Patient will be seen for follow-up : MD consult 03/17/16 and Diabetes F/U 03/26/2016

## 2016-03-17 ENCOUNTER — Encounter (HOSPITAL_COMMUNITY): Payer: Self-pay

## 2016-03-17 ENCOUNTER — Ambulatory Visit (HOSPITAL_COMMUNITY)
Admission: RE | Admit: 2016-03-17 | Discharge: 2016-03-17 | Disposition: A | Discharge status: Home / Self Care | Payer: Medicaid Other | Source: Ambulatory Visit | Attending: Obstetrics | Admitting: Obstetrics

## 2016-03-17 VITALS — BP 103/65 | HR 89 | Wt 223.4 lb

## 2016-03-17 DIAGNOSIS — O34219 Maternal care for unspecified type scar from previous cesarean delivery: Secondary | ICD-10-CM | POA: Insufficient documentation

## 2016-03-17 DIAGNOSIS — O09522 Supervision of elderly multigravida, second trimester: Secondary | ICD-10-CM | POA: Insufficient documentation

## 2016-03-17 DIAGNOSIS — Z880 Allergy status to penicillin: Secondary | ICD-10-CM | POA: Diagnosis not present

## 2016-03-17 DIAGNOSIS — E119 Type 2 diabetes mellitus without complications: Secondary | ICD-10-CM | POA: Diagnosis not present

## 2016-03-17 DIAGNOSIS — Z3A16 16 weeks gestation of pregnancy: Secondary | ICD-10-CM | POA: Diagnosis not present

## 2016-03-17 DIAGNOSIS — O10012 Pre-existing essential hypertension complicating pregnancy, second trimester: Secondary | ICD-10-CM | POA: Diagnosis not present

## 2016-03-17 DIAGNOSIS — O24112 Pre-existing diabetes mellitus, type 2, in pregnancy, second trimester: Secondary | ICD-10-CM | POA: Diagnosis not present

## 2016-03-17 DIAGNOSIS — O162 Unspecified maternal hypertension, second trimester: Secondary | ICD-10-CM

## 2016-03-17 NOTE — Consult Note (Signed)
MFM consult  40 yr old [id number] at [redacted]w[redacted]d with chronic hypertension and type II diabetes referred by Dr. Ruthann Cancer for consult.  Past OB hx: 2 full term SVD, 1 C section- for NRFHT- that baby died on day of life 3 patient reports due to pulmonary hypertension PSH: back surgery; D&C, cholecystectomy, C section PMH: hypertension, diabetes Medications: norvasc, labetalol, glyburide, PNV Allergies: amoxicillin- hives Social hx: negative x3  I counseled the patient as follows: 1. Hypertension: I discussed that women with preexistent hypertension are at increased risk of adverse pregnancy outcome, including preeclampsia, abruption, fetal growth restriction, and perinatal death. The risk increases with severity of hypertension and presence of end-organ damage. Risk of superimposed preeclampsia is 10 to 25 %; risk of abruption is 0.7 to 1.5 %; and risk of fetal growth restriction is 8 to 16 %. I also discussed that risk of preterm delivery is increased and is usually iatrogenic from the complications listed above. Risk of preterm delivery in women with chronic hypertension is 12-34%. There is also an increased risk of requiring C section for the above reasons. Patient' hypertension is currently managed with norvasc and labetalol. BP today is 103/65- if continues to be in this range recommend discontinue norvasc and titrate labetalol as needed. We discussed these medications are considered safe in pregnancy although have an increased risk of fetal growth restriction. I would recommend targeting therapy to keep systolic blood pressures [id number] and diastolic blood pressures [id number]. I recommend obtaining baseline studies: normal baseline labs, patient reports had 24 hour urine- I do not have these results. I recommend serial ultrasounds for fetal growth every 4 weeks starting at [redacted] weeks gestation. I recommend close surveillance for the development of signs/symptoms of preeclampsia especially in the third trimester. I  recommend antenatal testing to start at [redacted] weeks gestation. Recommend delivery by estimated due date. I recommend starting a low dose aspirin (81mg /day) and explained in high risk women use of low dose aspirin has been shown to reduce risk of getting preeclampsia. 2. Diabetes: Discussed increased risks in pregnancy include: fetal macrosomia, shoulder dystocia, and increased risk of requiring a Cesarean delivery. There is also an increased risk of developing preeclampsia during the pregnancy. There is an increased risk of congenital malformations related to level of diabetic control in the first trimester. I discussed there is an increased risk of stillbirth, neonatal hypoglycemia, neonatal jaundice, and neonatal electrolyte disturbances. I recommend strict glucose control maintaining fasting blood sugars <90 and 2 hour postprandial values <120. Patient currently is not on medication as glyburide had not been approved- I called [facility] and it is now approved so patient may start today. I reviewed the patient's blood sugars. Has slightly elevated fasting and post lunch. I recommend start glyburide 1.25mg  qam and qhs. Patient had previously been on metformin but could not tolerate it due to GI symptoms.  I recommend review of sugars at least weekly. Patient should call with any sugars <60 or >200. I recommend starting fetal kick counts- at [redacted] weeks gestation. I recommend starting antenatal testing with either weekly biophysical profiles or twice weekly nonstress tests and weekly amniotic fluid index starting at 32 weeks.  I recommend following fetal growth every 4 weeks. I recommend delivery by estimated due date. Had normal baseline labs as above. Recommend check TSH. The patient should have an Ophthamology exam if not recently performed. Recommend check hemoglobin A1C every trimester.  Given the increased risk of congenital anomalies recommend fetal echocardiogram at 18-22 weeks. 3. Advanced  maternal age: - previously counseled - declined aneuploidy screening 4. Daughter with learning disability, polydactyly, club foot, and Hirschsprung disease: - previously counseled 5. Patient reports that the child she lost on day of life 3 she was told died from pulmonary hypertension- reports no known cardiac defect. 6. Previous C section: - briefly discussed options of trial of labor after C section vs repeat C section - discussed risks of TOLAC- uterine rupture, need for emergent C section, blood loss, need for hysterectomy, morbidity and mortality to mother and fetus- discussed these risks are very low; risk of uterine rupture is 0.7-1% - discussed benefits of VBAC- shorter recovery time, improved neonatal transition, decreased risk of invasive placentation/previa in future pregnancies - reviewed risks of C section - patient will discuss mode of delivery with her primary OB - given patient has had 2 vaginal deliveries prior and C section was for nonrecurring cause I feel she has a high chance of successful VBAC 7. Recommend fetal anatomic survey at 18-20 weeks (scheduled)  I spent a total of 40 minutes with the patient of which >50% was in face to face consultation.  Please call with questions.  Meredith [place], MD

## 2016-03-26 ENCOUNTER — Ambulatory Visit (HOSPITAL_COMMUNITY)
Admission: RE | Admit: 2016-03-26 | Discharge: 2016-03-26 | Disposition: A | Discharge status: Home / Self Care | Payer: Medicaid Other | Source: Ambulatory Visit | Attending: Obstetrics | Admitting: Obstetrics

## 2016-03-26 ENCOUNTER — Encounter: Payer: Medicaid Other | Attending: Obstetrics | Admitting: *Deleted

## 2016-03-26 DIAGNOSIS — Z029 Encounter for administrative examinations, unspecified: Secondary | ICD-10-CM | POA: Insufficient documentation

## 2016-03-26 DIAGNOSIS — O24419 Gestational diabetes mellitus in pregnancy, unspecified control: Secondary | ICD-10-CM

## 2016-03-26 NOTE — Progress Notes (Signed)
Pt presents for review of glucose readings. She is presently taking Glyburide 1.25mg  BID. FBS range 86-94, 2hpp breakfst range 84-109mg /dl, 2hpp lunch 78-114, 2hpp dinner 89-126mg /dl . Only one reading outside of recommended parameters. Has appointment in one week for ultrasound. Will have MD review glucose readings at that time. Please advise patient when to return for Diabetes Consult or to call glucose readings into nurse as we co-manage GDM with Dr. Ruthann Cancer.

## 2016-04-02 ENCOUNTER — Encounter (HOSPITAL_COMMUNITY): Payer: Self-pay

## 2016-04-02 ENCOUNTER — Ambulatory Visit (HOSPITAL_COMMUNITY)
Admission: RE | Admit: 2016-04-02 | Discharge: 2016-04-02 | Disposition: A | Discharge status: Home / Self Care | Payer: Medicaid Other | Source: Ambulatory Visit | Attending: Obstetrics | Admitting: Obstetrics

## 2016-04-02 ENCOUNTER — Other Ambulatory Visit (HOSPITAL_COMMUNITY): Payer: Self-pay | Admitting: Maternal and Fetal Medicine

## 2016-04-02 ENCOUNTER — Other Ambulatory Visit: Payer: Self-pay

## 2016-04-02 ENCOUNTER — Emergency Department (HOSPITAL_COMMUNITY)
Admission: EM | Admit: 2016-04-02 | Discharge: 2016-04-02 | Disposition: A | Discharge status: Home / Self Care | Payer: Medicaid Other | Attending: Emergency Medicine | Admitting: Emergency Medicine

## 2016-04-02 ENCOUNTER — Encounter (HOSPITAL_COMMUNITY): Payer: Self-pay | Admitting: Emergency Medicine

## 2016-04-02 ENCOUNTER — Emergency Department (HOSPITAL_COMMUNITY): Payer: Medicaid Other

## 2016-04-02 VITALS — BP 122/57 | HR 91 | Wt 225.0 lb

## 2016-04-02 DIAGNOSIS — O352XX0 Maternal care for (suspected) hereditary disease in fetus, not applicable or unspecified: Secondary | ICD-10-CM | POA: Diagnosis not present

## 2016-04-02 DIAGNOSIS — R0781 Pleurodynia: Secondary | ICD-10-CM | POA: Insufficient documentation

## 2016-04-02 DIAGNOSIS — O34219 Maternal care for unspecified type scar from previous cesarean delivery: Secondary | ICD-10-CM | POA: Diagnosis not present

## 2016-04-02 DIAGNOSIS — Z880 Allergy status to penicillin: Secondary | ICD-10-CM | POA: Diagnosis not present

## 2016-04-02 DIAGNOSIS — O99212 Obesity complicating pregnancy, second trimester: Secondary | ICD-10-CM

## 2016-04-02 DIAGNOSIS — Z3689 Encounter for other specified antenatal screening: Secondary | ICD-10-CM

## 2016-04-02 DIAGNOSIS — O09522 Supervision of elderly multigravida, second trimester: Secondary | ICD-10-CM | POA: Diagnosis not present

## 2016-04-02 DIAGNOSIS — O24112 Pre-existing diabetes mellitus, type 2, in pregnancy, second trimester: Secondary | ICD-10-CM

## 2016-04-02 DIAGNOSIS — O10019 Pre-existing essential hypertension complicating pregnancy, unspecified trimester: Secondary | ICD-10-CM

## 2016-04-02 DIAGNOSIS — O10912 Unspecified pre-existing hypertension complicating pregnancy, second trimester: Secondary | ICD-10-CM | POA: Insufficient documentation

## 2016-04-02 DIAGNOSIS — O24419 Gestational diabetes mellitus in pregnancy, unspecified control: Secondary | ICD-10-CM | POA: Diagnosis not present

## 2016-04-02 DIAGNOSIS — Z3A19 19 weeks gestation of pregnancy: Secondary | ICD-10-CM

## 2016-04-02 DIAGNOSIS — O09292 Supervision of pregnancy with other poor reproductive or obstetric history, second trimester: Secondary | ICD-10-CM | POA: Diagnosis not present

## 2016-04-02 DIAGNOSIS — Z7982 Long term (current) use of aspirin: Secondary | ICD-10-CM | POA: Diagnosis not present

## 2016-04-02 DIAGNOSIS — O10012 Pre-existing essential hypertension complicating pregnancy, second trimester: Secondary | ICD-10-CM | POA: Insufficient documentation

## 2016-04-02 DIAGNOSIS — Z7984 Long term (current) use of oral hypoglycemic drugs: Secondary | ICD-10-CM | POA: Diagnosis not present

## 2016-04-02 DIAGNOSIS — Z79899 Other long term (current) drug therapy: Secondary | ICD-10-CM | POA: Diagnosis not present

## 2016-04-02 DIAGNOSIS — Z8742 Personal history of other diseases of the female genital tract: Secondary | ICD-10-CM | POA: Insufficient documentation

## 2016-04-02 DIAGNOSIS — Z8619 Personal history of other infectious and parasitic diseases: Secondary | ICD-10-CM | POA: Diagnosis not present

## 2016-04-02 DIAGNOSIS — O9989 Other specified diseases and conditions complicating pregnancy, childbirth and the puerperium: Secondary | ICD-10-CM | POA: Diagnosis present

## 2016-04-02 DIAGNOSIS — Z87891 Personal history of nicotine dependence: Secondary | ICD-10-CM | POA: Insufficient documentation

## 2016-04-02 DIAGNOSIS — O09529 Supervision of elderly multigravida, unspecified trimester: Secondary | ICD-10-CM

## 2016-04-02 LAB — CBC WITH DIFFERENTIAL/PLATELET
BASOS ABS: 0 10*3/uL (ref 0.0–0.1)
Basophils Relative: 0 %
EOS ABS: 0.1 10*3/uL (ref 0.0–0.7)
EOS PCT: 1 %
HCT: 32 % — ABNORMAL LOW (ref 36.0–46.0)
Hemoglobin: 10.3 g/dL — ABNORMAL LOW (ref 12.0–15.0)
Lymphocytes Relative: 23 %
Lymphs Abs: 2.3 10*3/uL (ref 0.7–4.0)
MCH: 28.6 pg (ref 26.0–34.0)
MCHC: 32.2 g/dL (ref 30.0–36.0)
MCV: 88.9 fL (ref 78.0–100.0)
Monocytes Absolute: 0.8 10*3/uL (ref 0.1–1.0)
Monocytes Relative: 8 %
Neutro Abs: 6.7 10*3/uL (ref 1.7–7.7)
Neutrophils Relative %: 68 %
Platelets: 325 10*3/uL (ref 150–400)
RBC: 3.6 MIL/uL — AB (ref 3.87–5.11)
RDW: 13.8 % (ref 11.5–15.5)
WBC: 10 10*3/uL (ref 4.0–10.5)

## 2016-04-02 LAB — I-STAT TROPONIN, ED
Troponin i, poc: 0 ng/mL (ref 0.00–0.08)
Troponin i, poc: 0.02 ng/mL (ref 0.00–0.08)

## 2016-04-02 LAB — I-STAT CHEM 8, ED
BUN: 6 mg/dL (ref 6–20)
CALCIUM ION: 1.27 mmol/L — AB (ref 1.12–1.23)
Chloride: 104 mmol/L (ref 101–111)
Creatinine, Ser: 0.5 mg/dL (ref 0.44–1.00)
GLUCOSE: 113 mg/dL — AB (ref 65–99)
HCT: 34 % — ABNORMAL LOW (ref 36.0–46.0)
HEMOGLOBIN: 11.6 g/dL — AB (ref 12.0–15.0)
Potassium: 3.7 mmol/L (ref 3.5–5.1)
Sodium: 138 mmol/L (ref 135–145)
TCO2: 22 mmol/L (ref 0–100)

## 2016-04-02 MED ORDER — IOPAMIDOL (ISOVUE-370) INJECTION 76%
INTRAVENOUS | Status: AC
  Administered 2016-04-02: 100 mL
  Filled 2016-04-02: qty 100

## 2016-04-02 MED ORDER — SODIUM CHLORIDE 0.9 % IV BOLUS (SEPSIS)
1000.0000 mL | Freq: Once | INTRAVENOUS | Status: AC
  Administered 2016-04-02: 1000 mL via INTRAVENOUS

## 2016-04-02 MED ORDER — ACETAMINOPHEN 500 MG PO TABS
1000.0000 mg | ORAL_TABLET | Freq: Once | ORAL | Status: AC
  Administered 2016-04-02: 1000 mg via ORAL
  Filled 2016-04-02: qty 2

## 2016-04-02 NOTE — ED Notes (Signed)
Pt began having left sided chest pain that "goes around my left breast,"  The pain started as she was trying to go to sleep, breathing makes it worse and nothing makes it better.  She was at the [facility] today as she is 19 weeks preg, reporting no complication w/ her pregnency.  Hx of diabetes and HNT.

## 2016-04-02 NOTE — ED Provider Notes (Signed)
CSN: [id number]     Arrival date & time 04/02/16  [id number] History   First MD Initiated Contact with Patient 04/02/16 0546     Chief Complaint  Patient presents with  . Chest Pain     (Consider location/radiation/quality/duration/timing/severity/associated sxs/prior Treatment) HPI  Fryda Dorcely is a 40 y.o. female with past medical history of hypertension, presenting today with left-sided chest pain. Patient states she is currently [redacted] weeks pregnant. She desired he had a normal ultrasound this pregnancy. She states her left-sided chest pain is sharp and goes around her left breast. It is worse when she takes a deep breath. Nothing makes it better. This occurred while she was laying in bed. She denies any history of blood clots. She states she does have swelling in both her legs, left greater than right. She denies any pain in her legs. She has no recent long trips or surgeries. There are no further complaints.  10 Systems reviewed and are negative for acute change except as noted in the HPI.     Past Medical History  Diagnosis Date  . Hypertension   . Diabetes in pregnancy ([place])   . Migraines   . Ovarian cyst   . Chlamydia   . Trichomonas    Past Surgical History  Procedure Laterality Date  . Cholecystectomy  02/2009  . Cesarean section  12/27/2011    Procedure: CESAREAN SECTION;  Surgeon: Shelly Bombard, MD;  Location: [facility];  Service: Gynecology;  Laterality: N/A;  Primary cesarean section with delivery of baby girl at 73. Apgars 3/3/6  . Dilation and curettage of uterus N/A 06/25/2014    Procedure: DILATATION AND CURETTAGE;  Surgeon: Donnamae Jude, MD;  Location: [facility];  Service: Gynecology;  Laterality: N/A;  . Lumbar laminectomy/decompression microdiscectomy Left 04/10/2015    Procedure: Left L5-S1 Microdiscectomy;  Surgeon: Marybelle Killings, MD;  Location: [facility];  Service: Orthopedics;  Laterality: Left;  . Back surgery     Family History  Problem Relation Age of Onset  .  Hypertension Maternal Aunt   . Hypertension Paternal Aunt   . Anesthesia problems Neg Hx   . Hypotension Neg Hx   . Malignant hyperthermia Neg Hx   . Pseudochol deficiency Neg Hx   . Club foot Daughter   . Hirschsprung's disease Daughter   . Learning disabilities Daughter   . Polydactyly Daughter    Social History  Substance Use Topics  . Smoking status: Former Smoker -- 0.25 packs/day for 16 years    Types: Cigarettes    Quit date: 12/22/2015  . Smokeless tobacco: Never Used  . Alcohol Use: No   OB History    Gravida Para Term Preterm AB TAB SAB Ectopic Multiple Living   5 3 3  1 1    2      Review of Systems    Allergies  Amoxicillin  Home Medications   Prior to Admission medications   Medication Sig Start Date End Date Taking? Authorizing Provider  amLODipine (NORVASC) 10 MG tablet Take 10 mg by mouth daily.   Yes Historical Provider, MD  aspirin EC 81 MG tablet Take 81 mg by mouth daily.   Yes Historical Provider, MD  diphenhydrAMINE (BENADRYL) 25 MG tablet Take 25 mg by mouth every 6 (six) hours as needed for sleep.   Yes Historical Provider, MD  glyBURIDE (DIABETA) 1.25 MG tablet Take 1.25 mg by mouth 2 (two) times daily.   Yes Historical Provider, MD  labetalol (NORMODYNE) 200  MG tablet Take 1 tablet by mouth 3 (three) times daily. 02/24/16  Yes Historical Provider, MD  Prenatal Vit-Fe Fumarate-FA (PRENATAL MULTIVITAMIN) TABS tablet Take 1 tablet by mouth daily at 12 noon.   Yes Historical Provider, MD   BP 121/67 mmHg  Pulse 85  Temp(Src) 98.2 F (36.8 C) (Oral)  Resp 13  Ht 5' 2.5" (1.588 m)  Wt 226 lb (102.513 kg)  BMI 40.65 kg/m2  SpO2 98%  LMP 12/16/2015 (Approximate) Physical Exam  Constitutional: She is oriented to person, place, and time. She appears well-developed and well-nourished. No distress.  HENT:  Head: Normocephalic and atraumatic.  Nose: Nose normal.  Mouth/Throat: Oropharynx is clear and moist. No oropharyngeal exudate.  Eyes:  Conjunctivae and EOM are normal. Pupils are equal, round, and reactive to light. No scleral icterus.  Neck: Normal range of motion. Neck supple. No JVD present. No tracheal deviation present. No thyromegaly present.  Cardiovascular: Normal rate, regular rhythm and normal heart sounds.  Exam reveals no gallop and no friction rub.   No murmur heard. Pulmonary/Chest: Effort normal and breath sounds normal. No respiratory distress. She has no wheezes. She exhibits no tenderness.  Abdominal: Soft. Bowel sounds are normal. She exhibits no distension and no mass. There is no tenderness. There is no rebound and no guarding.  Musculoskeletal: Normal range of motion. She exhibits no edema or tenderness.  Lymphadenopathy:    She has no cervical adenopathy.  Neurological: She is alert and oriented to person, place, and time. No cranial nerve deficit. She exhibits normal muscle tone.  Skin: Skin is warm and dry. No rash noted. No erythema. No pallor.  Nursing note and vitals reviewed.   ED Course  Procedures (including critical care time) Labs Review Labs Reviewed  CBC WITH DIFFERENTIAL/PLATELET - Abnormal; Notable for the following:    RBC 3.60 (*)    Hemoglobin 10.3 (*)    HCT 32.0 (*)    All other components within normal limits  I-STAT CHEM 8, ED - Abnormal; Notable for the following:    Glucose, Bld 113 (*)    Calcium, Ion 1.27 (*)    Hemoglobin 11.6 (*)    HCT 34.0 (*)    All other components within normal limits  I-STAT TROPOININ, ED    Imaging Review No results found. I have personally reviewed and evaluated these images and lab results as part of my medical decision-making.   EKG Interpretation None      MDM   Final diagnoses:  None    Patient presents to emergency department for chest pain. I concern for possible pulmonary embolism. She does have quite difficulty breathing with a deep breath when I was auscultating her lungs. EKG is unremarkable, CT scan of the chest is  currently pending. Bedside ultrasound reveals live IUP. Patient be signed out to oncoming provider for follow-up with CT scan. She was given tylenol for pain.   EMERGENCY DEPARTMENT [place] PREGNANCY "Study: Limited Ultrasound of the Pelvis"  INDICATIONS:Pregnancy(required) Multiple views of the uterus and pelvic cavity are obtained with a multi-frequency probe.  APPROACH:Transabdominal   PERFORMED BY: Myself  IMAGES ARCHIVED?: Yes  LIMITATIONS: Body habitus  PREGNANCY FREE FLUID: None  PREGNANCY UTERUS FINDINGS:Uterus enlarged ADNEXAL FINDINGS: none  PREGNANCY FINDINGS: Live IUP  INTERPRETATION: Viable intrauterine pregnancy  GESTATIONAL AGE, ESTIMATE: did not obtain  FETAL HEART RATE: video obtained       Everlene Balls, MD 04/02/16 0700

## 2016-04-02 NOTE — ED Notes (Signed)
Blood sugar log copied, scanned to chart and reviewed by Dr. Burnett Harry.

## 2016-04-02 NOTE — Discharge Instructions (Signed)
We saw you in the ER for the chest pain/shortness of breath. All of our cardiac workup is normal, including labs, EKG and chest X-RAY are normal. We are not sure what is causing your discomfort, but we feel comfortable sending you home at this time. The workup in the ER is not complete, and you should follow up with your primary care doctor for further evaluation.   Nonspecific Chest Pain  Chest pain can be caused by many different conditions. There is always a chance that your pain could be related to something serious, such as a heart attack or a blood clot in your lungs. Chest pain can also be caused by conditions that are not life-threatening. If you have chest pain, it is very important to follow up with your health care provider. CAUSES  Chest pain can be caused by:  Heartburn.  Pneumonia or bronchitis.  Anxiety or stress.  Inflammation around your heart (pericarditis) or lung (pleuritis or pleurisy).  A blood clot in your lung.  A collapsed lung (pneumothorax). It can develop suddenly on its own (spontaneous pneumothorax) or from trauma to the chest.  Shingles infection (varicella-zoster virus).  Heart attack.  Damage to the bones, muscles, and cartilage that make up your chest wall. This can include:  Bruised bones due to injury.  Strained muscles or cartilage due to frequent or repeated coughing or overwork.  Fracture to one or more ribs.  Sore cartilage due to inflammation (costochondritis). RISK FACTORS  Risk factors for chest pain may include:  Activities that increase your risk for trauma or injury to your chest.  Respiratory infections or conditions that cause frequent coughing.  Medical conditions or overeating that can cause heartburn.  Heart disease or family history of heart disease.  Conditions or health behaviors that increase your risk of developing a blood clot.  Having had chicken pox (varicella zoster). SIGNS AND SYMPTOMS Chest pain can feel  like:  Burning or tingling on the surface of your chest or deep in your chest.  Crushing, pressure, aching, or squeezing pain.  Dull or sharp pain that is worse when you move, cough, or take a deep breath.  Pain that is also felt in your back, neck, shoulder, or arm, or pain that spreads to any of these areas. Your chest pain may come and go, or it may stay constant. DIAGNOSIS Lab tests or other studies may be needed to find the cause of your pain. Your health care provider may have you take a test called an ambulatory ECG (electrocardiogram). An ECG records your heartbeat patterns at the time the test is performed. You may also have other tests, such as:  Transthoracic echocardiogram (TTE). During echocardiography, sound waves are used to create a picture of all of the heart structures and to look at how blood flows through your heart.  Transesophageal echocardiogram (TEE).This is a more advanced imaging test that obtains images from inside your body. It allows your health care provider to see your heart in finer detail.  Cardiac monitoring. This allows your health care provider to monitor your heart rate and rhythm in real time.  Holter monitor. This is a portable device that records your heartbeat and can help to diagnose abnormal heartbeats. It allows your health care provider to track your heart activity for several days, if needed.  Stress tests. These can be done through exercise or by taking medicine that makes your heart beat more quickly.  Blood tests.  Imaging tests. TREATMENT  Your treatment  depends on what is causing your chest pain. Treatment may include:  Medicines. These may include:  Acid blockers for heartburn.  Anti-inflammatory medicine.  Pain medicine for inflammatory conditions.  Antibiotic medicine, if an infection is present.  Medicines to dissolve blood clots.  Medicines to treat coronary artery disease.  Supportive care for conditions that do not  require medicines. This may include:  Resting.  Applying heat or cold packs to injured areas.  Limiting activities until pain decreases. HOME CARE INSTRUCTIONS  If you were prescribed an antibiotic medicine, finish it all even if you start to feel better.  Avoid any activities that bring on chest pain.  Do not use any tobacco products, including cigarettes, chewing tobacco, or electronic cigarettes. If you need help quitting, ask your health care provider.  Do not drink alcohol.  Take medicines only as directed by your health care provider.  Keep all follow-up visits as directed by your health care provider. This is important. This includes any further testing if your chest pain does not go away.  If heartburn is the cause for your chest pain, you may be told to keep your head raised (elevated) while sleeping. This reduces the chance that acid will go from your stomach into your esophagus.  Make lifestyle changes as directed by your health care provider. These may include:  Getting regular exercise. Ask your health care provider to suggest some activities that are safe for you.  Eating a heart-healthy diet. A registered dietitian can help you to learn healthy eating options.  Maintaining a healthy weight.  Managing diabetes, if necessary.  Reducing stress. SEEK MEDICAL CARE IF:  Your chest pain does not go away after treatment.  You have a rash with blisters on your chest.  You have a fever. SEEK IMMEDIATE MEDICAL CARE IF:   Your chest pain is worse.  You have an increasing cough, or you cough up blood.  You have severe abdominal pain.  You have severe weakness.  You faint.  You have chills.  You have sudden, unexplained chest discomfort.  You have sudden, unexplained discomfort in your arms, back, neck, or jaw.  You have shortness of breath at any time.  You suddenly start to sweat, or your skin gets clammy.  You feel nauseous or you vomit.  You  suddenly feel light-headed or dizzy.  Your heart begins to beat quickly, or it feels like it is skipping beats. These symptoms may represent a serious problem that is an emergency. Do not wait to see if the symptoms will go away. Get medical help right away. Call your local emergency services (911 in the [place] Do not drive yourself to the hospital.   This information is not intended to replace advice given to you by your health care provider. Make sure you discuss any questions you have with your health care provider.   Document Released: 09/16/2005 Document Revised: 12/28/2014 Document Reviewed: 07/13/2014 Elsevier Interactive Patient Education [employer].

## 2016-04-03 ENCOUNTER — Other Ambulatory Visit (HOSPITAL_COMMUNITY): Payer: Self-pay

## 2016-04-03 ENCOUNTER — Other Ambulatory Visit (HOSPITAL_COMMUNITY): Payer: Self-pay | Admitting: *Deleted

## 2016-04-03 DIAGNOSIS — O24119 Pre-existing diabetes mellitus, type 2, in pregnancy, unspecified trimester: Secondary | ICD-10-CM

## 2016-04-23 ENCOUNTER — Encounter (HOSPITAL_COMMUNITY): Payer: Self-pay | Admitting: *Deleted

## 2016-04-23 ENCOUNTER — Inpatient Hospital Stay (HOSPITAL_COMMUNITY)
Admission: AD | Admit: 2016-04-23 | Discharge: 2016-04-23 | Disposition: A | Discharge status: Home / Self Care | Payer: Medicaid Other | Source: Ambulatory Visit | Attending: Obstetrics | Admitting: Obstetrics

## 2016-04-23 DIAGNOSIS — Z880 Allergy status to penicillin: Secondary | ICD-10-CM | POA: Diagnosis not present

## 2016-04-23 DIAGNOSIS — E119 Type 2 diabetes mellitus without complications: Secondary | ICD-10-CM | POA: Insufficient documentation

## 2016-04-23 DIAGNOSIS — Z79899 Other long term (current) drug therapy: Secondary | ICD-10-CM | POA: Diagnosis not present

## 2016-04-23 DIAGNOSIS — Z3A22 22 weeks gestation of pregnancy: Secondary | ICD-10-CM | POA: Insufficient documentation

## 2016-04-23 DIAGNOSIS — Z87891 Personal history of nicotine dependence: Secondary | ICD-10-CM | POA: Insufficient documentation

## 2016-04-23 DIAGNOSIS — Z7982 Long term (current) use of aspirin: Secondary | ICD-10-CM | POA: Diagnosis not present

## 2016-04-23 DIAGNOSIS — O212 Late vomiting of pregnancy: Secondary | ICD-10-CM | POA: Diagnosis not present

## 2016-04-23 DIAGNOSIS — O219 Vomiting of pregnancy, unspecified: Secondary | ICD-10-CM | POA: Diagnosis not present

## 2016-04-23 DIAGNOSIS — I10 Essential (primary) hypertension: Secondary | ICD-10-CM | POA: Diagnosis not present

## 2016-04-23 DIAGNOSIS — O24112 Pre-existing diabetes mellitus, type 2, in pregnancy, second trimester: Secondary | ICD-10-CM | POA: Diagnosis not present

## 2016-04-23 DIAGNOSIS — R112 Nausea with vomiting, unspecified: Secondary | ICD-10-CM | POA: Diagnosis present

## 2016-04-23 HISTORY — DX: Type 2 diabetes mellitus without complications: E11.9

## 2016-04-23 LAB — COMPREHENSIVE METABOLIC PANEL
ALBUMIN: 3 g/dL — AB (ref 3.5–5.0)
ALT: 46 U/L (ref 14–54)
AST: 33 U/L (ref 15–41)
Alkaline Phosphatase: 80 U/L (ref 38–126)
Anion gap: 8 (ref 5–15)
BUN: 8 mg/dL (ref 6–20)
CO2: 20 mmol/L — ABNORMAL LOW (ref 22–32)
Calcium: 8.9 mg/dL (ref 8.9–10.3)
Chloride: 106 mmol/L (ref 101–111)
Creatinine, Ser: 0.49 mg/dL (ref 0.44–1.00)
GFR calc Af Amer: >60 mL/min (ref >60)
GFR calc non Af Amer: >60 mL/min (ref >60)
Glucose, Bld: 101 mg/dL — ABNORMAL HIGH (ref 65–99)
POTASSIUM: 3.1 mmol/L — AB (ref 3.5–5.1)
SODIUM: 134 mmol/L — AB (ref 135–145)
Total Bilirubin: 0.3 mg/dL (ref 0.3–1.2)
Total Protein: 7.3 g/dL (ref 6.5–8.1)

## 2016-04-23 LAB — URINALYSIS, ROUTINE W REFLEX MICROSCOPIC
Glucose, UA: NEGATIVE mg/dL
Ketones, ur: 15 mg/dL — AB
Leukocytes, UA: NEGATIVE
NITRITE: NEGATIVE
PH: 5.5 (ref 5.0–8.0)
Protein, ur: NEGATIVE mg/dL
SPECIFIC GRAVITY, URINE: 1.025 (ref 1.005–1.030)

## 2016-04-23 LAB — CBC
HCT: 31.7 % — ABNORMAL LOW (ref 36.0–46.0)
Hemoglobin: 10.5 g/dL — ABNORMAL LOW (ref 12.0–15.0)
MCH: 29.2 pg (ref 26.0–34.0)
MCHC: 33.1 g/dL (ref 30.0–36.0)
MCV: 88.3 fL (ref 78.0–100.0)
PLATELETS: 317 10*3/uL (ref 150–400)
RBC: 3.59 MIL/uL — AB (ref 3.87–5.11)
RDW: 14 % (ref 11.5–15.5)
WBC: 7.7 10*3/uL (ref 4.0–10.5)

## 2016-04-23 LAB — GLUCOSE, CAPILLARY: GLUCOSE-CAPILLARY: 96 mg/dL (ref 65–99)

## 2016-04-23 LAB — URINE MICROSCOPIC-ADD ON: WBC UA: NONE SEEN WBC/hpf (ref 0–5)

## 2016-04-23 MED ORDER — LACTATED RINGERS IV BOLUS (SEPSIS)
1000.0000 mL | Freq: Once | INTRAVENOUS | Status: AC
  Administered 2016-04-23: 1000 mL via INTRAVENOUS

## 2016-04-23 MED ORDER — ONDANSETRON 8 MG PO TBDP: 8.0000 mg | ORAL_TABLET | Freq: Three times a day (TID) | ORAL | Status: DC | PRN

## 2016-04-23 MED ORDER — SODIUM CHLORIDE 0.9 % IV SOLN
8.0000 mg | Freq: Once | INTRAVENOUS | Status: AC
  Administered 2016-04-23: 8 mg via INTRAVENOUS
  Filled 2016-04-23: qty 4

## 2016-04-23 NOTE — MAU Provider Note (Signed)
History     CSN: [id number]  Arrival date and time: 04/23/16 1220   First Provider Initiated Contact with Patient 04/23/16 1408       Chief Complaint  Patient presents with  . Nausea  . Emesis   HPI  Meredith York is a 40 y.o. [id number] at [redacted]w[redacted]d who presents with n/v. Symptoms began yesterday. Has vomited 4 times in the last 24 hours. Still currently nauseated. Hasn't tried to eat anything today. Took phenergan last night without relief. Denies diarrhea, constipation, fever, sick contacts, vaginal bleeding, LOF, or heartburn. Does endorse constant lower abdominal & back aching since last night. Rate [place] 8/10. Has not treated.  Positive fetal movement.   OB History    Gravida Para Term Preterm AB TAB SAB Ectopic Multiple Living   6 3 3  2 1 1   2       Past Medical History  Diagnosis Date  . Hypertension   . Migraines   . Ovarian cyst   . Chlamydia   . Trichomonas   . Type 2 diabetes mellitus (HCC)     glyburide    Past Surgical History  Procedure Laterality Date  . Cholecystectomy  02/2009  . Cesarean section  12/27/2011    Procedure: CESAREAN SECTION;  Surgeon: Shelly Bombard, MD;  Location: [facility];  Service: Gynecology;  Laterality: N/A;  Primary cesarean section with delivery of baby girl at 81. Apgars 3/3/6  . Dilation and curettage of uterus N/A 06/25/2014    Procedure: DILATATION AND CURETTAGE;  Surgeon: Donnamae Jude, MD;  Location: [facility];  Service: Gynecology;  Laterality: N/A;  . Lumbar laminectomy/decompression microdiscectomy Left 04/10/2015    Procedure: Left L5-S1 Microdiscectomy;  Surgeon: Marybelle Killings, MD;  Location: [facility];  Service: Orthopedics;  Laterality: Left;  . Back surgery      Family History  Problem Relation Age of Onset  . Hypertension Maternal Aunt   . Hypertension Paternal Aunt   . Anesthesia problems Neg Hx   . Hypotension Neg Hx   . Malignant hyperthermia Neg Hx   . Pseudochol deficiency Neg Hx   . Club foot Daughter   .  Hirschsprung's disease Daughter   . Learning disabilities Daughter   . Polydactyly Daughter     Social History  Substance Use Topics  . Smoking status: Former Smoker -- 0.25 packs/day for 16 years    Types: Cigarettes    Quit date: 12/22/2015  . Smokeless tobacco: Never Used  . Alcohol Use: No    Allergies:  Allergies  Allergen Reactions  . Amoxicillin Hives and Itching    Has patient had a PCN reaction causing immediate rash, facial/tongue/throat swelling, SOB or lightheadedness with hypotension: No Has patient had a PCN reaction causing severe rash involving mucus membranes or skin necrosis: No Has patient had a PCN reaction that required hospitalization No Has patient had a PCN reaction occurring within the last 10 years: No If all of the above answers are "NO", then may proceed with Cephalosporin use.     Prescriptions prior to admission  Medication Sig Dispense Refill Last Dose  . amLODipine (NORVASC) 10 MG tablet Take 10 mg by mouth daily.   04/22/2016 at Unknown time  . aspirin EC 81 MG tablet Take 81 mg by mouth daily.   04/22/2016 at Unknown time  . diphenhydrAMINE (BENADRYL) 25 MG tablet Take 25 mg by mouth every 6 (six) hours as needed for sleep. Reported on 04/02/2016   Past Month  at Unknown time  . glyBURIDE (DIABETA) 1.25 MG tablet Take 1.25 mg by mouth 2 (two) times daily.   04/22/2016 at Unknown time  . labetalol (NORMODYNE) 200 MG tablet Take 1 tablet by mouth 3 (three) times daily.  2 04/22/2016 at 1800  . Prenatal Vit-Fe Fumarate-FA (PRENATAL MULTIVITAMIN) TABS tablet Take 1 tablet by mouth daily at 12 noon.   04/22/2016 at Unknown time    Review of Systems  Constitutional: Negative.   Gastrointestinal: Positive for nausea, vomiting and abdominal pain. Negative for heartburn, diarrhea and constipation.  Genitourinary: Negative.   Musculoskeletal: Positive for back pain.   Physical Exam   Blood pressure 122/61, pulse 105, temperature 98.7 F (37.1 C), temperature  source Oral, resp. rate 18, height 5' 2.25" (1.581 m), weight 220 lb 6.4 oz (99.973 kg), last menstrual period 12/16/2015, SpO2 100 %, unknown if currently breastfeeding.  Physical Exam  Nursing note and vitals reviewed. Constitutional: She is oriented to person, place, and time. She appears well-developed and well-nourished. No distress.  HENT:  Head: Normocephalic and atraumatic.  Eyes: Conjunctivae are normal. Right eye exhibits no discharge. Left eye exhibits no discharge. No scleral icterus.  Neck: Normal range of motion.  Cardiovascular: Normal rate, regular rhythm and normal heart sounds.   No murmur heard. Respiratory: Effort normal and breath sounds normal. No respiratory distress. She has no wheezes.  GI: Soft. Bowel sounds are normal. There is no tenderness.  Neurological: She is alert and oriented to person, place, and time.  Skin: Skin is warm and dry. She is not diaphoretic.  Psychiatric: She has a normal mood and affect. Her behavior is normal. Judgment and thought content normal.   Dilation: Closed Effacement (%): Thick Cervical Position: Posterior Exam by:: Jorje Guild NP  MAU Course  Procedures Results for orders placed or performed during the hospital encounter of 04/23/16 (from the past 24 hour(s))  Urinalysis, Routine w reflex microscopic (not at [facility])     Status: Abnormal   Collection Time: 04/23/16 12:35 PM  Result Value Ref Range   Color, Urine AMBER (A) YELLOW   APPearance CLEAR CLEAR   Specific Gravity, Urine 1.025 1.005 - 1.030   pH 5.5 5.0 - 8.0   Glucose, UA NEGATIVE NEGATIVE mg/dL   Hgb urine dipstick SMALL (A) NEGATIVE   Bilirubin Urine SMALL (A) NEGATIVE   Ketones, ur 15 (A) NEGATIVE mg/dL   Protein, ur NEGATIVE NEGATIVE mg/dL   Nitrite NEGATIVE NEGATIVE   Leukocytes, UA NEGATIVE NEGATIVE  Urine microscopic-add on     Status: Abnormal   Collection Time: 04/23/16 12:35 PM  Result Value Ref Range   Squamous Epithelial / LPF 0-5 (A) NONE SEEN    WBC, UA NONE SEEN 0 - 5 WBC/hpf   RBC / HPF 0-5 0 - 5 RBC/hpf   Bacteria, UA RARE (A) NONE SEEN  CBC     Status: Abnormal   Collection Time: 04/23/16  2:18 PM  Result Value Ref Range   WBC 7.7 4.0 - 10.5 K/uL   RBC 3.59 (L) 3.87 - 5.11 MIL/uL   Hemoglobin 10.5 (L) 12.0 - 15.0 g/dL   HCT 31.7 (L) 36.0 - 46.0 %   MCV 88.3 78.0 - 100.0 fL   MCH 29.2 26.0 - 34.0 pg   MCHC 33.1 30.0 - 36.0 g/dL   RDW 14.0 11.5 - 15.5 %   Platelets 317 150 - 400 K/uL  Comprehensive metabolic panel     Status: Abnormal   Collection Time: 04/23/16  2:18 PM  Result Value Ref Range   Sodium 134 (L) 135 - 145 mmol/L   Potassium 3.1 (L) 3.5 - 5.1 mmol/L   Chloride 106 101 - 111 mmol/L   CO2 20 (L) 22 - 32 mmol/L   Glucose, Bld 101 (H) 65 - 99 mg/dL   BUN 8 6 - 20 mg/dL   Creatinine, Ser 0.49 0.44 - 1.00 mg/dL   Calcium 8.9 8.9 - 10.3 mg/dL   Total Protein 7.3 6.5 - 8.1 g/dL   Albumin 3.0 (L) 3.5 - 5.0 g/dL   AST 33 15 - 41 U/L   ALT 46 14 - 54 U/L   Alkaline Phosphatase 80 38 - 126 U/L   Total Bilirubin 0.3 0.3 - 1.2 mg/dL   GFR calc non Af Amer >60 >60 mL/min   GFR calc Af Amer >60 >60 mL/min   Anion gap 8 5 - 15  Glucose, capillary     Status: None   Collection Time: 04/23/16  2:26 PM  Result Value Ref Range   Glucose-Capillary 96 65 - 99 mg/dL    MDM FHT 160 by doppler IV hydration with 1 bag of LR Zofran 8 mg IV Pt not observed vomiting & able to tolerate water & crackers Cervix closed CBG normal Assessment and Plan  A: 1. Vomiting of pregnancy    P; Discharge home Rx zofran Advance diet as tolerated; discussed BRAT diet Keep f/u with Dr. Ruthann Cancer Discussed reasons to return  Jorje Guild 04/23/2016, 2:08 PM

## 2016-04-23 NOTE — MAU Note (Signed)
Pt reports she has not been able to keep  Anything down since yesterday.Vomiting 4-5 time. C/o abd and back pain as well.

## 2016-04-23 NOTE — Discharge Instructions (Signed)

## 2016-04-30 ENCOUNTER — Other Ambulatory Visit (HOSPITAL_COMMUNITY): Payer: Self-pay | Admitting: Maternal and Fetal Medicine

## 2016-04-30 ENCOUNTER — Encounter (HOSPITAL_COMMUNITY): Payer: Self-pay

## 2016-04-30 ENCOUNTER — Ambulatory Visit (HOSPITAL_COMMUNITY)
Admission: RE | Admit: 2016-04-30 | Discharge: 2016-04-30 | Disposition: A | Discharge status: Home / Self Care | Payer: Medicaid Other | Source: Ambulatory Visit | Attending: Obstetrics | Admitting: Obstetrics

## 2016-04-30 VITALS — BP 104/51 | HR 84 | Wt 226.4 lb

## 2016-04-30 DIAGNOSIS — O352XX0 Maternal care for (suspected) hereditary disease in fetus, not applicable or unspecified: Secondary | ICD-10-CM | POA: Diagnosis not present

## 2016-04-30 DIAGNOSIS — O99212 Obesity complicating pregnancy, second trimester: Secondary | ICD-10-CM

## 2016-04-30 DIAGNOSIS — Z3A23 23 weeks gestation of pregnancy: Secondary | ICD-10-CM | POA: Diagnosis not present

## 2016-04-30 DIAGNOSIS — O09292 Supervision of pregnancy with other poor reproductive or obstetric history, second trimester: Secondary | ICD-10-CM | POA: Diagnosis not present

## 2016-04-30 DIAGNOSIS — O24112 Pre-existing diabetes mellitus, type 2, in pregnancy, second trimester: Secondary | ICD-10-CM | POA: Diagnosis not present

## 2016-04-30 DIAGNOSIS — O09522 Supervision of elderly multigravida, second trimester: Secondary | ICD-10-CM

## 2016-04-30 DIAGNOSIS — O34219 Maternal care for unspecified type scar from previous cesarean delivery: Secondary | ICD-10-CM | POA: Insufficient documentation

## 2016-04-30 DIAGNOSIS — O10012 Pre-existing essential hypertension complicating pregnancy, second trimester: Secondary | ICD-10-CM | POA: Insufficient documentation

## 2016-04-30 DIAGNOSIS — O24119 Pre-existing diabetes mellitus, type 2, in pregnancy, unspecified trimester: Secondary | ICD-10-CM

## 2016-04-30 NOTE — ED Notes (Signed)
Blood sugar log copied, scanned to chart and reviewed by Dr. Burnett Harry.

## 2016-05-06 ENCOUNTER — Other Ambulatory Visit (HOSPITAL_COMMUNITY): Payer: Self-pay

## 2016-06-03 ENCOUNTER — Other Ambulatory Visit (HOSPITAL_COMMUNITY): Payer: Self-pay

## 2016-06-11 ENCOUNTER — Ambulatory Visit (HOSPITAL_COMMUNITY)
Admission: RE | Admit: 2016-06-11 | Discharge: 2016-06-11 | Disposition: A | Discharge status: Home / Self Care | Payer: Medicaid Other | Source: Ambulatory Visit | Attending: Obstetrics | Admitting: Obstetrics

## 2016-06-11 ENCOUNTER — Encounter (HOSPITAL_COMMUNITY): Payer: Self-pay

## 2016-06-11 VITALS — BP 115/71 | HR 89 | Wt 234.4 lb

## 2016-06-11 DIAGNOSIS — O99213 Obesity complicating pregnancy, third trimester: Secondary | ICD-10-CM | POA: Insufficient documentation

## 2016-06-11 DIAGNOSIS — O24113 Pre-existing diabetes mellitus, type 2, in pregnancy, third trimester: Secondary | ICD-10-CM | POA: Insufficient documentation

## 2016-06-11 DIAGNOSIS — O34219 Maternal care for unspecified type scar from previous cesarean delivery: Secondary | ICD-10-CM | POA: Diagnosis not present

## 2016-06-11 DIAGNOSIS — Z3A29 29 weeks gestation of pregnancy: Secondary | ICD-10-CM | POA: Insufficient documentation

## 2016-06-11 DIAGNOSIS — O352XX0 Maternal care for (suspected) hereditary disease in fetus, not applicable or unspecified: Secondary | ICD-10-CM | POA: Diagnosis not present

## 2016-06-11 DIAGNOSIS — O10013 Pre-existing essential hypertension complicating pregnancy, third trimester: Secondary | ICD-10-CM | POA: Insufficient documentation

## 2016-06-11 DIAGNOSIS — O09523 Supervision of elderly multigravida, third trimester: Secondary | ICD-10-CM | POA: Insufficient documentation

## 2016-06-11 DIAGNOSIS — O24119 Pre-existing diabetes mellitus, type 2, in pregnancy, unspecified trimester: Secondary | ICD-10-CM

## 2016-06-11 DIAGNOSIS — O09293 Supervision of pregnancy with other poor reproductive or obstetric history, third trimester: Secondary | ICD-10-CM | POA: Diagnosis not present

## 2016-06-11 DIAGNOSIS — O09522 Supervision of elderly multigravida, second trimester: Secondary | ICD-10-CM

## 2016-06-24 ENCOUNTER — Encounter: Payer: Self-pay | Admitting: Obstetrics

## 2016-06-29 ENCOUNTER — Other Ambulatory Visit (HOSPITAL_COMMUNITY): Payer: Self-pay | Admitting: Maternal and Fetal Medicine

## 2016-06-29 ENCOUNTER — Encounter (HOSPITAL_COMMUNITY): Payer: Self-pay

## 2016-06-29 ENCOUNTER — Ambulatory Visit (HOSPITAL_COMMUNITY)
Admission: RE | Admit: 2016-06-29 | Discharge: 2016-06-29 | Disposition: A | Discharge status: Home / Self Care | Payer: Medicaid Other | Source: Ambulatory Visit | Attending: Obstetrics | Admitting: Obstetrics

## 2016-06-29 DIAGNOSIS — O34219 Maternal care for unspecified type scar from previous cesarean delivery: Secondary | ICD-10-CM | POA: Insufficient documentation

## 2016-06-29 DIAGNOSIS — O352XX0 Maternal care for (suspected) hereditary disease in fetus, not applicable or unspecified: Secondary | ICD-10-CM | POA: Diagnosis not present

## 2016-06-29 DIAGNOSIS — Z3A31 31 weeks gestation of pregnancy: Secondary | ICD-10-CM | POA: Insufficient documentation

## 2016-06-29 DIAGNOSIS — O24113 Pre-existing diabetes mellitus, type 2, in pregnancy, third trimester: Secondary | ICD-10-CM | POA: Insufficient documentation

## 2016-06-29 DIAGNOSIS — O09523 Supervision of elderly multigravida, third trimester: Secondary | ICD-10-CM | POA: Insufficient documentation

## 2016-06-29 DIAGNOSIS — O09299 Supervision of pregnancy with other poor reproductive or obstetric history, unspecified trimester: Secondary | ICD-10-CM

## 2016-06-29 DIAGNOSIS — O09522 Supervision of elderly multigravida, second trimester: Secondary | ICD-10-CM

## 2016-06-29 DIAGNOSIS — O10013 Pre-existing essential hypertension complicating pregnancy, third trimester: Secondary | ICD-10-CM | POA: Insufficient documentation

## 2016-06-29 DIAGNOSIS — O99213 Obesity complicating pregnancy, third trimester: Secondary | ICD-10-CM | POA: Insufficient documentation

## 2016-06-29 DIAGNOSIS — O09293 Supervision of pregnancy with other poor reproductive or obstetric history, third trimester: Secondary | ICD-10-CM | POA: Insufficient documentation

## 2016-06-29 NOTE — ED Notes (Signed)
Refill for glyburide 1.25mg  Q AM, 2.5mg  Q HS called to [facility] pharmacy per Dr. Lisbeth Renshaw.

## 2016-07-01 ENCOUNTER — Other Ambulatory Visit (HOSPITAL_COMMUNITY): Payer: Self-pay

## 2016-07-02 ENCOUNTER — Encounter (HOSPITAL_COMMUNITY): Payer: Self-pay

## 2016-07-02 ENCOUNTER — Ambulatory Visit (HOSPITAL_COMMUNITY)
Admission: RE | Admit: 2016-07-02 | Discharge: 2016-07-02 | Disposition: A | Discharge status: Home / Self Care | Payer: Medicaid Other | Source: Ambulatory Visit | Attending: Obstetrics | Admitting: Obstetrics

## 2016-07-02 DIAGNOSIS — Z3483 Encounter for supervision of other normal pregnancy, third trimester: Secondary | ICD-10-CM | POA: Insufficient documentation

## 2016-07-06 ENCOUNTER — Other Ambulatory Visit (HOSPITAL_COMMUNITY): Payer: Self-pay | Admitting: Maternal and Fetal Medicine

## 2016-07-06 ENCOUNTER — Ambulatory Visit (HOSPITAL_COMMUNITY)
Admission: RE | Admit: 2016-07-06 | Discharge: 2016-07-06 | Disposition: A | Discharge status: Home / Self Care | Payer: Medicaid Other | Source: Ambulatory Visit | Attending: Obstetrics | Admitting: Obstetrics

## 2016-07-06 ENCOUNTER — Encounter (HOSPITAL_COMMUNITY): Payer: Self-pay

## 2016-07-06 DIAGNOSIS — O34219 Maternal care for unspecified type scar from previous cesarean delivery: Secondary | ICD-10-CM

## 2016-07-06 DIAGNOSIS — O10013 Pre-existing essential hypertension complicating pregnancy, third trimester: Secondary | ICD-10-CM | POA: Insufficient documentation

## 2016-07-06 DIAGNOSIS — O09293 Supervision of pregnancy with other poor reproductive or obstetric history, third trimester: Secondary | ICD-10-CM | POA: Insufficient documentation

## 2016-07-06 DIAGNOSIS — Z3A32 32 weeks gestation of pregnancy: Secondary | ICD-10-CM

## 2016-07-06 DIAGNOSIS — O24313 Unspecified pre-existing diabetes mellitus in pregnancy, third trimester: Secondary | ICD-10-CM | POA: Diagnosis not present

## 2016-07-06 DIAGNOSIS — O24113 Pre-existing diabetes mellitus, type 2, in pregnancy, third trimester: Secondary | ICD-10-CM

## 2016-07-06 DIAGNOSIS — O352XX0 Maternal care for (suspected) hereditary disease in fetus, not applicable or unspecified: Secondary | ICD-10-CM | POA: Diagnosis not present

## 2016-07-06 DIAGNOSIS — O99213 Obesity complicating pregnancy, third trimester: Secondary | ICD-10-CM

## 2016-07-06 DIAGNOSIS — O10019 Pre-existing essential hypertension complicating pregnancy, unspecified trimester: Secondary | ICD-10-CM

## 2016-07-06 DIAGNOSIS — O09522 Supervision of elderly multigravida, second trimester: Secondary | ICD-10-CM

## 2016-07-06 DIAGNOSIS — O09523 Supervision of elderly multigravida, third trimester: Secondary | ICD-10-CM | POA: Diagnosis not present

## 2016-07-07 ENCOUNTER — Encounter: Payer: Self-pay | Admitting: Obstetrics

## 2016-07-07 ENCOUNTER — Ambulatory Visit (INDEPENDENT_AMBULATORY_CARE_PROVIDER_SITE_OTHER): Payer: Medicaid Other | Admitting: Obstetrics

## 2016-07-07 VITALS — BP 118/75 | HR 105 | Temp 98.3°F | Wt 235.0 lb

## 2016-07-07 DIAGNOSIS — Z1389 Encounter for screening for other disorder: Secondary | ICD-10-CM | POA: Diagnosis not present

## 2016-07-07 DIAGNOSIS — Z3483 Encounter for supervision of other normal pregnancy, third trimester: Secondary | ICD-10-CM | POA: Diagnosis not present

## 2016-07-07 DIAGNOSIS — Z331 Pregnant state, incidental: Secondary | ICD-10-CM

## 2016-07-07 LAB — POCT URINALYSIS DIPSTICK
BILIRUBIN UA: NEGATIVE
GLUCOSE UA: NEGATIVE
Leukocytes, UA: NEGATIVE
NITRITE UA: NEGATIVE
RBC UA: NEGATIVE
Spec Grav, UA: 1.025
Urobilinogen, UA: NEGATIVE
pH, UA: 5

## 2016-07-07 NOTE — Progress Notes (Signed)
Subjective:    Meredith York is a 40 y.o. female being seen today for her obstetrical visit. She is at [redacted]w[redacted]d gestation. Patient reports no complaints. Fetal movement: normal.  Problem List Items Addressed This Visit    None    Visit Diagnoses    Encounter for supervision of other normal pregnancy in third trimester    -  Primary    Relevant Orders    POCT urinalysis dipstick (Completed)      Patient Active Problem List   Diagnosis Date Noted  . Advanced maternal age in multigravida 03/12/2016  . S/P lumbar discectomy 04/10/2015  . Type II or unspecified type diabetes mellitus without mention of complication, not stated as uncontrolled 06/25/2014  . Abortion, missed 06/25/2014  . Headache(784.0) 07/05/2011  . Hypertension 07/05/2011   Objective:    BP 118/75 mmHg  Pulse 105  Temp(Src) 98.3 F (36.8 C)  Wt 235 lb (106.595 kg)  LMP 12/16/2015 (Approximate) FHT:  150 BPM  Uterine Size: size greater than dates  Presentation: unsure     Assessment:    Pregnancy @ [redacted]w[redacted]d weeks .  Previous C/S.  Wants VBAC.  OK'd for VBAC by Dr. Ruthann Cancer.    Diabetes, on Glyburide.  Stable.  Chronic HTN.  BP's stable on Norvasc  Plan:    Referred to [facility] for BPP's, and ultrasounds for growth monthly   labs reviewed, problem list updated Consent for VBAC given to patient to read and sign. TDAP offered  Rhogam given for RH negative Pediatrician: discussed. Infant feeding: plans to breastfeed. Maternity leave: discussed. Cigarette smoking: former smoker.  Orders Placed This Encounter  Procedures  . POCT urinalysis dipstick   No orders of the defined types were placed in this encounter.   Follow up in 1 Week.

## 2016-07-09 ENCOUNTER — Other Ambulatory Visit (HOSPITAL_COMMUNITY): Payer: Self-pay

## 2016-07-09 ENCOUNTER — Other Ambulatory Visit (HOSPITAL_COMMUNITY): Payer: Self-pay | Admitting: Obstetrics and Gynecology

## 2016-07-09 ENCOUNTER — Ambulatory Visit (HOSPITAL_COMMUNITY)
Admission: RE | Admit: 2016-07-09 | Discharge: 2016-07-09 | Disposition: A | Discharge status: Home / Self Care | Payer: Medicaid Other | Source: Ambulatory Visit | Attending: Obstetrics | Admitting: Obstetrics

## 2016-07-09 ENCOUNTER — Encounter (HOSPITAL_COMMUNITY): Payer: Self-pay

## 2016-07-09 ENCOUNTER — Other Ambulatory Visit (HOSPITAL_COMMUNITY): Payer: Self-pay | Admitting: *Deleted

## 2016-07-09 ENCOUNTER — Ambulatory Visit (HOSPITAL_COMMUNITY)
Admission: RE | Admit: 2016-07-09 | Discharge: 2016-07-09 | Disposition: A | Discharge status: Home / Self Care | Payer: Medicaid Other | Source: Ambulatory Visit | Attending: Family Medicine | Admitting: Family Medicine

## 2016-07-09 DIAGNOSIS — O352XX0 Maternal care for (suspected) hereditary disease in fetus, not applicable or unspecified: Secondary | ICD-10-CM | POA: Insufficient documentation

## 2016-07-09 DIAGNOSIS — O283 Abnormal ultrasonic finding on antenatal screening of mother: Secondary | ICD-10-CM | POA: Insufficient documentation

## 2016-07-09 DIAGNOSIS — O99213 Obesity complicating pregnancy, third trimester: Secondary | ICD-10-CM

## 2016-07-09 DIAGNOSIS — O10013 Pre-existing essential hypertension complicating pregnancy, third trimester: Secondary | ICD-10-CM

## 2016-07-09 DIAGNOSIS — Z3A33 33 weeks gestation of pregnancy: Secondary | ICD-10-CM

## 2016-07-09 DIAGNOSIS — O34219 Maternal care for unspecified type scar from previous cesarean delivery: Secondary | ICD-10-CM | POA: Diagnosis not present

## 2016-07-09 DIAGNOSIS — O09523 Supervision of elderly multigravida, third trimester: Secondary | ICD-10-CM | POA: Insufficient documentation

## 2016-07-09 DIAGNOSIS — O1013 Pre-existing hypertensive heart disease complicating the puerperium: Secondary | ICD-10-CM | POA: Insufficient documentation

## 2016-07-09 DIAGNOSIS — O09293 Supervision of pregnancy with other poor reproductive or obstetric history, third trimester: Secondary | ICD-10-CM

## 2016-07-09 DIAGNOSIS — O288 Other abnormal findings on antenatal screening of mother: Secondary | ICD-10-CM

## 2016-07-09 DIAGNOSIS — O24313 Unspecified pre-existing diabetes mellitus in pregnancy, third trimester: Secondary | ICD-10-CM | POA: Diagnosis present

## 2016-07-09 DIAGNOSIS — O24319 Unspecified pre-existing diabetes mellitus in pregnancy, unspecified trimester: Secondary | ICD-10-CM

## 2016-07-09 DIAGNOSIS — Z7984 Long term (current) use of oral hypoglycemic drugs: Principal | ICD-10-CM

## 2016-07-13 ENCOUNTER — Ambulatory Visit (HOSPITAL_COMMUNITY)
Admission: RE | Admit: 2016-07-13 | Discharge: 2016-07-13 | Disposition: A | Discharge status: Home / Self Care | Payer: Medicaid Other | Source: Ambulatory Visit | Attending: Obstetrics | Admitting: Obstetrics

## 2016-07-13 ENCOUNTER — Other Ambulatory Visit (HOSPITAL_COMMUNITY): Payer: Self-pay | Admitting: Obstetrics and Gynecology

## 2016-07-13 ENCOUNTER — Encounter (HOSPITAL_COMMUNITY): Payer: Self-pay

## 2016-07-13 DIAGNOSIS — O99213 Obesity complicating pregnancy, third trimester: Secondary | ICD-10-CM | POA: Insufficient documentation

## 2016-07-13 DIAGNOSIS — O09523 Supervision of elderly multigravida, third trimester: Secondary | ICD-10-CM | POA: Diagnosis not present

## 2016-07-13 DIAGNOSIS — O10019 Pre-existing essential hypertension complicating pregnancy, unspecified trimester: Secondary | ICD-10-CM

## 2016-07-13 DIAGNOSIS — O24313 Unspecified pre-existing diabetes mellitus in pregnancy, third trimester: Secondary | ICD-10-CM | POA: Insufficient documentation

## 2016-07-13 DIAGNOSIS — O34219 Maternal care for unspecified type scar from previous cesarean delivery: Secondary | ICD-10-CM

## 2016-07-13 DIAGNOSIS — O09293 Supervision of pregnancy with other poor reproductive or obstetric history, third trimester: Secondary | ICD-10-CM | POA: Diagnosis not present

## 2016-07-13 DIAGNOSIS — Z3A33 33 weeks gestation of pregnancy: Secondary | ICD-10-CM

## 2016-07-13 DIAGNOSIS — O09522 Supervision of elderly multigravida, second trimester: Secondary | ICD-10-CM

## 2016-07-13 DIAGNOSIS — O10013 Pre-existing essential hypertension complicating pregnancy, third trimester: Secondary | ICD-10-CM | POA: Insufficient documentation

## 2016-07-13 DIAGNOSIS — Z7984 Long term (current) use of oral hypoglycemic drugs: Secondary | ICD-10-CM

## 2016-07-13 DIAGNOSIS — O352XX0 Maternal care for (suspected) hereditary disease in fetus, not applicable or unspecified: Secondary | ICD-10-CM

## 2016-07-13 DIAGNOSIS — O24319 Unspecified pre-existing diabetes mellitus in pregnancy, unspecified trimester: Secondary | ICD-10-CM

## 2016-07-14 ENCOUNTER — Encounter: Payer: Self-pay | Admitting: Obstetrics and Gynecology

## 2016-07-14 ENCOUNTER — Encounter (HOSPITAL_COMMUNITY): Payer: Self-pay | Admitting: Obstetrics & Gynecology

## 2016-07-14 ENCOUNTER — Ambulatory Visit (INDEPENDENT_AMBULATORY_CARE_PROVIDER_SITE_OTHER): Payer: Medicaid Other | Admitting: Obstetrics and Gynecology

## 2016-07-14 VITALS — BP 98/64 | HR 96 | Wt 239.0 lb

## 2016-07-14 DIAGNOSIS — O09523 Supervision of elderly multigravida, third trimester: Secondary | ICD-10-CM

## 2016-07-14 DIAGNOSIS — O0993 Supervision of high risk pregnancy, unspecified, third trimester: Secondary | ICD-10-CM

## 2016-07-14 DIAGNOSIS — O0990 Supervision of high risk pregnancy, unspecified, unspecified trimester: Secondary | ICD-10-CM | POA: Insufficient documentation

## 2016-07-14 DIAGNOSIS — I10 Essential (primary) hypertension: Secondary | ICD-10-CM

## 2016-07-14 DIAGNOSIS — O34219 Maternal care for unspecified type scar from previous cesarean delivery: Secondary | ICD-10-CM

## 2016-07-14 DIAGNOSIS — E118 Type 2 diabetes mellitus with unspecified complications: Secondary | ICD-10-CM

## 2016-07-14 DIAGNOSIS — Z98891 History of uterine scar from previous surgery: Secondary | ICD-10-CM | POA: Insufficient documentation

## 2016-07-14 NOTE — Progress Notes (Signed)
Prenatal Visit Note Date: 07/14/2016 Clinic: [facility]  Subjective:  Meredith York is a 40 y.o. [id number] at [redacted]w[redacted]d being seen today for ongoing prenatal care.  She is currently monitored for the following issues for this high-risk pregnancy and has Hypertension; DM (diabetes mellitus), type 2 ([place]); Advanced maternal age in multigravida; Supervision of high-risk pregnancy; and History of cesarean delivery affecting pregnancy on her problem list.  Patient reports no complaints.   Contractions: Irregular. Vag. Bleeding: None.  Movement: Present. Denies leaking of fluid.   The following portions of the patient's history were reviewed and updated as appropriate: allergies, current medications, past family history, past medical history, past social history, past surgical history and problem list. Problem list updated.  Objective:   Vitals:   07/14/16 1356  BP: 98/64  Pulse: 96  Weight: 239 lb (108.4 kg)    Fetal Status: Fetal Heart Rate (bpm): 150   Movement: Present     General:  Alert, oriented and cooperative. Patient is in no acute distress.  Skin: Skin is warm and dry. No rash noted.   Cardiovascular: Normal heart rate noted  Respiratory: Normal respiratory effort, no problems with respiration noted  Abdomen: Soft, gravid, appropriate for gestational age. Pain/Pressure: Present     Pelvic:  Cervical exam deferred        Extremities: Normal range of motion.  Edema: Trace  Mental Status: Normal mood and affect. Normal behavior. Normal judgment and thought content.   Urinalysis: Urine Protein: Negative Urine Glucose: Negative  Assessment and Plan:  Pregnancy: [id number] at [redacted]w[redacted]d  1. Supervision of high-risk pregnancy, third trimester Routine care. GBS nv  2. History of cesarean delivery affecting pregnancy D/w pt re: r/b/a to VBAC including maternal fetal risks and she would like a rpt c-section and BTL. Papers signed today and copy given to patient. Request sent to scheduler.   3.  Essential hypertension On labetalol 200 q8h. Normal BPs today. Follow up one week and may need to back off dose  4. Type 2 diabetes mellitus with complication, without long-term current use of insulin ([place]) On glyburide 1.25/2.5. Normal BS log today. Getting antenatal testing at [facility] UTD recent AFI, BPPs and growth scan (large AC). appts already scheduled.   5. Advanced maternal age in multigravida, third trimester Previously counseled. Declined screening. Normal anatomy  Term labor symptoms and general obstetric precautions including but not limited to vaginal bleeding, contractions, leaking of fluid and fetal movement were reviewed in detail with the patient. Please refer to After Visit Summary for other counseling recommendations.   RTC 1 wk  Aletha Halim, MD

## 2016-07-15 ENCOUNTER — Encounter (HOSPITAL_COMMUNITY): Payer: Self-pay

## 2016-07-15 ENCOUNTER — Encounter: Payer: Self-pay | Admitting: *Deleted

## 2016-07-16 ENCOUNTER — Ambulatory Visit (HOSPITAL_COMMUNITY)
Admission: RE | Admit: 2016-07-16 | Discharge: 2016-07-16 | Disposition: A | Discharge status: Home / Self Care | Payer: Medicaid Other | Source: Ambulatory Visit | Attending: Obstetrics | Admitting: Obstetrics

## 2016-07-16 ENCOUNTER — Encounter (HOSPITAL_COMMUNITY): Payer: Self-pay

## 2016-07-16 DIAGNOSIS — O358XX0 Maternal care for other (suspected) fetal abnormality and damage, not applicable or unspecified: Secondary | ICD-10-CM | POA: Insufficient documentation

## 2016-07-16 DIAGNOSIS — O283 Abnormal ultrasonic finding on antenatal screening of mother: Secondary | ICD-10-CM | POA: Insufficient documentation

## 2016-07-16 DIAGNOSIS — O24313 Unspecified pre-existing diabetes mellitus in pregnancy, third trimester: Secondary | ICD-10-CM | POA: Diagnosis present

## 2016-07-16 DIAGNOSIS — O288 Other abnormal findings on antenatal screening of mother: Secondary | ICD-10-CM

## 2016-07-16 DIAGNOSIS — O10013 Pre-existing essential hypertension complicating pregnancy, third trimester: Secondary | ICD-10-CM | POA: Insufficient documentation

## 2016-07-16 DIAGNOSIS — Z3A34 34 weeks gestation of pregnancy: Secondary | ICD-10-CM | POA: Diagnosis not present

## 2016-07-16 DIAGNOSIS — O09523 Supervision of elderly multigravida, third trimester: Secondary | ICD-10-CM

## 2016-07-16 DIAGNOSIS — O99213 Obesity complicating pregnancy, third trimester: Secondary | ICD-10-CM | POA: Diagnosis not present

## 2016-07-20 ENCOUNTER — Ambulatory Visit (HOSPITAL_COMMUNITY)
Admission: RE | Admit: 2016-07-20 | Discharge: 2016-07-20 | Disposition: A | Discharge status: Home / Self Care | Payer: Medicaid Other | Source: Ambulatory Visit | Attending: Obstetrics | Admitting: Obstetrics

## 2016-07-20 ENCOUNTER — Other Ambulatory Visit (HOSPITAL_COMMUNITY): Payer: Self-pay | Admitting: Obstetrics and Gynecology

## 2016-07-20 DIAGNOSIS — O283 Abnormal ultrasonic finding on antenatal screening of mother: Secondary | ICD-10-CM | POA: Diagnosis not present

## 2016-07-20 DIAGNOSIS — O10013 Pre-existing essential hypertension complicating pregnancy, third trimester: Secondary | ICD-10-CM | POA: Diagnosis not present

## 2016-07-20 DIAGNOSIS — Z7984 Long term (current) use of oral hypoglycemic drugs: Principal | ICD-10-CM

## 2016-07-20 DIAGNOSIS — O99213 Obesity complicating pregnancy, third trimester: Secondary | ICD-10-CM | POA: Insufficient documentation

## 2016-07-20 DIAGNOSIS — O352XX0 Maternal care for (suspected) hereditary disease in fetus, not applicable or unspecified: Secondary | ICD-10-CM | POA: Insufficient documentation

## 2016-07-20 DIAGNOSIS — Z3A34 34 weeks gestation of pregnancy: Secondary | ICD-10-CM | POA: Diagnosis not present

## 2016-07-20 DIAGNOSIS — O09293 Supervision of pregnancy with other poor reproductive or obstetric history, third trimester: Secondary | ICD-10-CM | POA: Diagnosis not present

## 2016-07-20 DIAGNOSIS — O24313 Unspecified pre-existing diabetes mellitus in pregnancy, third trimester: Secondary | ICD-10-CM | POA: Insufficient documentation

## 2016-07-20 DIAGNOSIS — O09523 Supervision of elderly multigravida, third trimester: Secondary | ICD-10-CM

## 2016-07-20 DIAGNOSIS — O24319 Unspecified pre-existing diabetes mellitus in pregnancy, unspecified trimester: Secondary | ICD-10-CM

## 2016-07-21 ENCOUNTER — Ambulatory Visit (INDEPENDENT_AMBULATORY_CARE_PROVIDER_SITE_OTHER): Payer: Medicaid Other | Admitting: Family Medicine

## 2016-07-21 VITALS — BP 132/74 | HR 106 | Wt 237.6 lb

## 2016-07-21 DIAGNOSIS — O0993 Supervision of high risk pregnancy, unspecified, third trimester: Secondary | ICD-10-CM | POA: Diagnosis not present

## 2016-07-21 DIAGNOSIS — O09523 Supervision of elderly multigravida, third trimester: Secondary | ICD-10-CM

## 2016-07-21 DIAGNOSIS — O34219 Maternal care for unspecified type scar from previous cesarean delivery: Secondary | ICD-10-CM

## 2016-07-21 DIAGNOSIS — E118 Type 2 diabetes mellitus with unspecified complications: Secondary | ICD-10-CM

## 2016-07-21 DIAGNOSIS — I10 Essential (primary) hypertension: Secondary | ICD-10-CM

## 2016-07-21 MED ORDER — GLYBURIDE 1.25 MG PO TABS: 1.2500 mg | ORAL_TABLET | ORAL | 1 refills | Status: DC

## 2016-07-21 NOTE — Progress Notes (Signed)
Subjective:  Meredith York is a 40 y.o. [id number] at 104w5d being seen today for ongoing prenatal care.  She is currently monitored for the following issues for this high-risk pregnancy and has Hypertension; DM (diabetes mellitus), type 2 ([place]); Advanced maternal age in multigravida; Supervision of high-risk pregnancy; History of cesarean delivery affecting pregnancy; and Neonatal death on her problem list.  GDM: Patient taking glyburide 1.25mg  in AM and 2.5mg  QHS.  Reports no hypoglycemic episodes.  Tolerating medication well Fasting: 86-92 2hr PP: 99-123  Patient reports no complaints.   . Vag. Bleeding: None.  Movement: Present. Denies leaking of fluid.   The following portions of the patient's history were reviewed and updated as appropriate: allergies, current medications, past family history, past medical history, past social history, past surgical history and problem list. Problem list updated.  Objective:   Vitals:   07/21/16 1333  BP: 132/74  Pulse: (!) 106  Weight: 237 lb 9.6 oz (107.8 kg)    Fetal Status: Fetal Heart Rate (bpm): 140   Movement: Present     General:  Alert, oriented and cooperative. Patient is in no acute distress.  Skin: Skin is warm and dry. No rash noted.   Cardiovascular: Normal heart rate noted  Respiratory: Normal respiratory effort, no problems with respiration noted  Abdomen: Soft, gravid, appropriate for gestational age. Pain/Pressure: Absent     Pelvic: Vag. Bleeding: None Vag D/C Character: Thin   Cervical exam deferred        Extremities: Normal range of motion.  Edema: None  Mental Status: Normal mood and affect. Normal behavior. Normal judgment and thought content.   Urinalysis: Urine Protein: Negative Urine Glucose: Negative  Assessment and Plan:  Pregnancy: [id number] at [redacted]w[redacted]d  1. Supervision of high-risk pregnancy, third trimester Normal FHT.  2. Type 2 diabetes mellitus with complication, without long-term current use of insulin  (HCC) Increase glyburide to 1.25mg  in AM and 3.75mg  at bedtime. Continue antenatal testing.  3. Essential hypertension Continue labetalol.  4. Advanced maternal age in multigravida, third trimester   5. History of cesarean delivery affecting pregnancy Will schedule RLTCS with BTL.  Preterm labor symptoms and general obstetric precautions including but not limited to vaginal bleeding, contractions, leaking of fluid and fetal movement were reviewed in detail with the patient. Please refer to After Visit Summary for other counseling recommendations.  Return in about 1 week (around 07/28/2016) for OB f/u.   Truett Mainland, DO

## 2016-07-23 ENCOUNTER — Encounter (HOSPITAL_COMMUNITY): Payer: Self-pay | Admitting: *Deleted

## 2016-07-23 ENCOUNTER — Inpatient Hospital Stay (HOSPITAL_COMMUNITY)
Admission: AD | Admit: 2016-07-23 | Discharge: 2016-07-23 | Disposition: A | Discharge status: Home / Self Care | Payer: Medicaid Other | Source: Ambulatory Visit | Attending: Obstetrics & Gynecology | Admitting: Obstetrics & Gynecology

## 2016-07-23 ENCOUNTER — Encounter (HOSPITAL_COMMUNITY): Payer: Self-pay

## 2016-07-23 ENCOUNTER — Other Ambulatory Visit (HOSPITAL_COMMUNITY): Payer: Self-pay | Admitting: Maternal and Fetal Medicine

## 2016-07-23 ENCOUNTER — Ambulatory Visit (HOSPITAL_COMMUNITY)
Admission: RE | Admit: 2016-07-23 | Discharge: 2016-07-23 | Disposition: A | Discharge status: Home / Self Care | Payer: Medicaid Other | Source: Ambulatory Visit | Attending: Obstetrics | Admitting: Obstetrics

## 2016-07-23 VITALS — BP 123/71 | HR 96 | Wt 238.6 lb

## 2016-07-23 DIAGNOSIS — O288 Other abnormal findings on antenatal screening of mother: Secondary | ICD-10-CM

## 2016-07-23 DIAGNOSIS — O4703 False labor before 37 completed weeks of gestation, third trimester: Secondary | ICD-10-CM | POA: Insufficient documentation

## 2016-07-23 DIAGNOSIS — O09523 Supervision of elderly multigravida, third trimester: Secondary | ICD-10-CM | POA: Diagnosis not present

## 2016-07-23 DIAGNOSIS — Z3A35 35 weeks gestation of pregnancy: Secondary | ICD-10-CM | POA: Insufficient documentation

## 2016-07-23 DIAGNOSIS — O09293 Supervision of pregnancy with other poor reproductive or obstetric history, third trimester: Secondary | ICD-10-CM | POA: Insufficient documentation

## 2016-07-23 DIAGNOSIS — O283 Abnormal ultrasonic finding on antenatal screening of mother: Secondary | ICD-10-CM | POA: Diagnosis not present

## 2016-07-23 DIAGNOSIS — O99213 Obesity complicating pregnancy, third trimester: Secondary | ICD-10-CM | POA: Diagnosis not present

## 2016-07-23 DIAGNOSIS — O24313 Unspecified pre-existing diabetes mellitus in pregnancy, third trimester: Secondary | ICD-10-CM | POA: Diagnosis present

## 2016-07-23 DIAGNOSIS — O352XX0 Maternal care for (suspected) hereditary disease in fetus, not applicable or unspecified: Secondary | ICD-10-CM | POA: Diagnosis not present

## 2016-07-23 DIAGNOSIS — O479 False labor, unspecified: Secondary | ICD-10-CM | POA: Diagnosis not present

## 2016-07-23 DIAGNOSIS — O10013 Pre-existing essential hypertension complicating pregnancy, third trimester: Secondary | ICD-10-CM | POA: Diagnosis not present

## 2016-07-23 LAB — URINALYSIS, ROUTINE W REFLEX MICROSCOPIC
Bilirubin Urine: NEGATIVE
Glucose, UA: 500 mg/dL — AB
Hgb urine dipstick: NEGATIVE
KETONES UR: 15 mg/dL — AB
LEUKOCYTES UA: NEGATIVE
NITRITE: NEGATIVE
PROTEIN: NEGATIVE mg/dL
Specific Gravity, Urine: 1.025 (ref 1.005–1.030)
pH: 6 (ref 5.0–8.0)

## 2016-07-23 NOTE — MAU Provider Note (Signed)
[facility] Attending MAU Note  Subjective:  40 y.o. [id number] at [redacted]w[redacted]d who was at [facility] today for antenatal testing for [facility] and DM in pregnancy (had BPP 10/10); here for cervical check due to occasional contractions seen on tocometer. Patient reports having contractions q7-10 minutes, no LOF or vaginal bleeding. Good FM.    Objective:  Blood pressure 105/56, pulse 106, temperature 98.2 F (36.8 C), temperature source Oral, height 5' 2.5" (1.588 m), weight 238 lb 9 oz (108.2 kg), last menstrual period 12/16/2015, unknown if currently breastfeeding. FHT  Baseline 145 bpm, moderate variability, +accelerations, no decelerations Toco: None Gen: NAD HENT: Normocephalic, atraumatic Lungs: Normal respiratory effort Heart: Regular rate noted Abdomen: NT, gravid fundus, soft Cervix: Dilation: 1 Effacement (%): Thick Cervical Position: [place]: Ballotable (high) Presentation: Vertex Exam by:: Jamai Dolce Ext: 2+ DTRs, no edema, no cyanosis, negative Homan's sign  Assessment & Plan:  40 y.o. [id number] at [redacted]w[redacted]d with Braxton-Hicks contractions. Reassuring fetal status. Labor and fetal movement precautions reviewed. Continue with scheduled antenatal testing and OB visits at [place], MD, [place] Attending [place], [facility]

## 2016-07-23 NOTE — MAU Note (Signed)
Patient sent from [facility] at [redacted] weeks gestation for labor eval. States NST and BPP were good. Fetus active. Denies bleeding or discharge. States she is monitoring her glucose levels and eating correctly and taking her BP meds and both are fine.

## 2016-07-23 NOTE — Discharge Instructions (Signed)
Braxton Hicks Contractions °Contractions of the uterus can occur throughout pregnancy. Contractions are not always a sign that you are in labor.  °WHAT ARE BRAXTON HICKS CONTRACTIONS?  °Contractions that occur before labor are called Braxton Hicks contractions, or false labor. Toward the end of pregnancy (32-34 weeks), these contractions can develop more often and may become more forceful. This is not true labor because these contractions do not result in opening (dilatation) and thinning of the cervix. They are sometimes difficult to tell apart from true labor because these contractions can be forceful and people have different pain tolerances. You should not feel embarrassed if you go to the hospital with false labor. Sometimes, the only way to tell if you are in true labor is for your health care provider to look for changes in the cervix. °If there are no prenatal problems or other health problems associated with the pregnancy, it is completely safe to be sent home with false labor and await the onset of true labor. °HOW CAN YOU TELL THE DIFFERENCE BETWEEN TRUE AND FALSE LABOR? °False Labor °· The contractions of false labor are usually shorter and not as hard as those of true labor.   °· The contractions are usually irregular.   °· The contractions are often felt in the front of the lower abdomen and in the groin.   °· The contractions may go away when you walk around or change positions while lying down.   °· The contractions get weaker and are shorter lasting as time goes on.   °· The contractions do not usually become progressively stronger, regular, and closer together as with true labor.   °True Labor °· Contractions in true labor last 30-70 seconds, become very regular, usually become more intense, and increase in frequency.   °· The contractions do not go away with walking.   °· The discomfort is usually felt in the top of the uterus and spreads to the lower abdomen and low back.   °· True labor can be  determined by your health care provider with an exam. This will show that the cervix is dilating and getting thinner.   °WHAT TO REMEMBER °· Keep up with your usual exercises and follow other instructions given by your health care provider.   °· Take medicines as directed by your health care provider.   °· Keep your regular prenatal appointments.   °· Eat and drink lightly if you think you are going into labor.   °· If Braxton Hicks contractions are making you uncomfortable:   °¨ Change your position from lying down or resting to walking, or from walking to resting.   °¨ Sit and rest in a tub of warm water.   °¨ Drink 2-3 glasses of water. Dehydration may cause these contractions.   °¨ Do slow and deep breathing several times an hour.   °WHEN SHOULD I SEEK IMMEDIATE MEDICAL CARE? °Seek immediate medical care if: °· Your contractions become stronger, more regular, and closer together.   °· You have fluid leaking or gushing from your vagina.   °· You have a fever.   °· You pass blood-tinged mucus.   °· You have vaginal bleeding.   °· You have continuous abdominal pain.   °· You have low back pain that you never had before.   °· You feel your baby's head pushing down and causing pelvic pressure.   °· Your baby is not moving as much as it used to.   °  °This information is not intended to replace advice given to you by your health care provider. Make sure you discuss any questions you have with your health care   provider. °  °Document Released: 12/07/2005 Document Revised: 12/12/2013 Document Reviewed: 09/18/2013 °Elsevier Interactive Patient Education ©2016 [employer] ° °

## 2016-07-26 ENCOUNTER — Encounter (HOSPITAL_COMMUNITY): Payer: Self-pay | Admitting: *Deleted

## 2016-07-26 ENCOUNTER — Inpatient Hospital Stay (HOSPITAL_COMMUNITY)
Admission: AD | Admit: 2016-07-26 | Discharge: 2016-07-27 | Disposition: A | Discharge status: Home / Self Care | Payer: Medicaid Other | Source: Ambulatory Visit | Attending: Obstetrics & Gynecology | Admitting: Obstetrics & Gynecology

## 2016-07-26 DIAGNOSIS — O10913 Unspecified pre-existing hypertension complicating pregnancy, third trimester: Secondary | ICD-10-CM | POA: Diagnosis not present

## 2016-07-26 DIAGNOSIS — Z7982 Long term (current) use of aspirin: Secondary | ICD-10-CM | POA: Insufficient documentation

## 2016-07-26 DIAGNOSIS — Z3A35 35 weeks gestation of pregnancy: Secondary | ICD-10-CM | POA: Diagnosis not present

## 2016-07-26 DIAGNOSIS — Z79899 Other long term (current) drug therapy: Secondary | ICD-10-CM | POA: Diagnosis not present

## 2016-07-26 DIAGNOSIS — O09523 Supervision of elderly multigravida, third trimester: Secondary | ICD-10-CM | POA: Insufficient documentation

## 2016-07-26 DIAGNOSIS — Z7984 Long term (current) use of oral hypoglycemic drugs: Secondary | ICD-10-CM | POA: Diagnosis not present

## 2016-07-26 DIAGNOSIS — O24113 Pre-existing diabetes mellitus, type 2, in pregnancy, third trimester: Secondary | ICD-10-CM | POA: Insufficient documentation

## 2016-07-26 LAB — URINALYSIS, ROUTINE W REFLEX MICROSCOPIC
BILIRUBIN URINE: NEGATIVE
GLUCOSE, UA: 100 mg/dL — AB
HGB URINE DIPSTICK: NEGATIVE
Ketones, ur: 15 mg/dL — AB
Leukocytes, UA: NEGATIVE
Nitrite: NEGATIVE
Protein, ur: NEGATIVE mg/dL
pH: 6 (ref 5.0–8.0)

## 2016-07-26 NOTE — MAU Note (Signed)
Pt reports contractions and has not felt the baby move since 1900

## 2016-07-26 NOTE — Discharge Instructions (Signed)
Drink plenty of fluids to stay hydrated. Follow up on all prenatal appointments.

## 2016-07-26 NOTE — MAU Provider Note (Signed)
History     CSN: [id number]  Arrival date and time: 07/26/16 2156   First Provider Initiated Contact with Patient 07/26/16 2256      Chief Complaint  Patient presents with  . Decreased Fetal Movement  . Contractions   HPI Pt is a 40yo [id number] at [redacted]w[redacted]d who presents with contractions every 2-8 minutes. She reports that she started getting contractions about a month ago but about 3.5 hours ago the contractions got stronger. She denies any vaginal bleeding or loss of fluid.   Baby has not been active but she reports that baby is often not active so this is not new or different. Her OB hx is significant for 2 full term SVD, 1 C section- for NRFHT- that baby died on day of life 3 patient reports due to pulmonary hypertension.  She is followed in the high risk clinic for: Hypertension; DM (diabetes mellitus), type 2 ([place]); Advanced maternal age in multigravida.    OB History    Gravida Para Term Preterm AB Living   6 3 3   2 2    SAB TAB Ectopic Multiple Live Births   1 1     3       Past Medical History:  Diagnosis Date  . Chlamydia   . Hypertension   . Migraines   . Ovarian cyst   . S/P lumbar discectomy 04/10/2015  . Trichomonas   . Type 2 diabetes mellitus (H[facility])    glyburide    Past Surgical History:  Procedure Laterality Date  . BACK SURGERY    . CESAREAN SECTION  12/27/2011   Procedure: CESAREAN SECTION;  Surgeon: Shelly Bombard, MD;  Location: [facility];  Service: Gynecology;  Laterality: N/A;  Primary cesarean section with delivery of baby girl at 84. Apgars 3/3/6  . CHOLECYSTECTOMY  02/2009  . DILATION AND CURETTAGE OF UTERUS N/A 06/25/2014   Procedure: DILATATION AND CURETTAGE;  Surgeon: Donnamae Jude, MD;  Location: [facility];  Service: Gynecology;  Laterality: N/A;  . LUMBAR LAMINECTOMY/DECOMPRESSION MICRODISCECTOMY Left 04/10/2015   Procedure: Left L5-S1 Microdiscectomy;  Surgeon: Marybelle Killings, MD;  Location: [facility];  Service: Orthopedics;  Laterality: Left;    Family  History  Problem Relation Age of Onset  . Hypertension Maternal Aunt   . Hypertension Paternal Aunt   . Club foot Daughter   . Hirschsprung's disease Daughter   . Learning disabilities Daughter   . Polydactyly Daughter   . Anesthesia problems Neg Hx   . Hypotension Neg Hx   . Malignant hyperthermia Neg Hx   . Pseudochol deficiency Neg Hx     Social History  Substance Use Topics  . Smoking status: Former Smoker    Packs/day: 0.25    Years: 16.00    Types: Cigarettes    Quit date: 12/22/2015  . Smokeless tobacco: Never Used  . Alcohol use No    Allergies:  Allergies  Allergen Reactions  . Amoxicillin Hives and Itching    Has patient had a PCN reaction causing immediate rash, facial/tongue/throat swelling, SOB or lightheadedness with hypotension: No Has patient had a PCN reaction causing severe rash involving mucus membranes or skin necrosis: No Has patient had a PCN reaction that required hospitalization No Has patient had a PCN reaction occurring within the last 10 years: No If all of the above answers are "NO", then may proceed with Cephalosporin use.     Prescriptions Prior to Admission  Medication Sig Dispense Refill Last Dose  . acetaminophen (  TYLENOL) 500 MG tablet Take 1,000 mg by mouth every 6 (six) hours as needed for mild pain or headache.    07/25/2016 at 2230  . aspirin EC 81 MG tablet Take 81 mg by mouth daily.   07/26/2016 at 1130  . glyBURIDE (DIABETA) 1.25 MG tablet Take 1 tablet (1.25 mg total) by mouth See admin instructions. 1 tab in AM and 3 tabs at bedtime 120 tablet 1 07/26/2016 at 2030  . labetalol (NORMODYNE) 200 MG tablet Take 1 tablet by mouth 3 (three) times daily.  2 07/26/2016 at 1900  . Prenatal Vit-Fe Fumarate-FA (PRENATAL MULTIVITAMIN) TABS tablet Take 1 tablet by mouth daily.    07/26/2016 at 1130  . ondansetron (ZOFRAN ODT) 8 MG disintegrating tablet Take 1 tablet (8 mg total) by mouth every 8 (eight) hours as needed for nausea or vomiting. (Patient not  taking: Reported on 07/23/2016) 20 tablet 0 Not Taking at Unknown time    Review of Systems  Constitutional: Negative for chills and fever.  Eyes: Negative for blurred vision and double vision.  Respiratory: Negative for cough and shortness of breath.   Cardiovascular: Negative for chest pain and palpitations.  Gastrointestinal: Positive for abdominal pain. Negative for constipation, diarrhea, heartburn, nausea and vomiting.  Genitourinary: Negative for dysuria, frequency and urgency.  Neurological: Negative for headaches.   Physical Exam   Blood pressure 148/83, pulse 100, temperature 98.3 F (36.8 C), temperature source Oral, resp. rate 19, height 5' 2.5" (1.588 m), weight 239 lb (108.4 kg), last menstrual period 12/16/2015, SpO2 98 %, unknown if currently breastfeeding.  Physical Exam  Constitutional: She is oriented to person, place, and time. She appears well-developed and well-nourished.  Neck: Normal range of motion.  Cardiovascular: Normal rate and regular rhythm.   Respiratory: Effort normal and breath sounds normal.  GI: Soft. Bowel sounds are normal.  Genitourinary: Vagina normal.  Neurological: She is alert and oriented to person, place, and time. She has normal reflexes.  Skin: Skin is warm and dry.  Psychiatric: She has a normal mood and affect. Her behavior is normal. Thought content normal.    MAU Course  Procedures & Lab -Urinalysis  -Cervical exam: closed and posterior  Fetal heart tones: reassuring Fetal Heart Rate A  Mode External filed at 07/26/2016 2345  Baseline Rate (A) 150 bpm filed at 07/26/2016 2345  Variability 6-25 BPM filed at 07/26/2016 2345  Accelerations 10 x 10 filed at 07/26/2016 2345  Decelerations None filed at 07/26/2016 2345     Assessment and Plan  Pt is a 40yo [id number] at [redacted]w[redacted]d who presents with irregular contractions every 2-10 minutes. These are most likely braxton hicks. Pt is reassured and encouraged to follow up with her regular  prenatal appointments.    Casimer Leek, MD, PGY-1, MPH 07/26/2016, 11:40 PM

## 2016-07-27 ENCOUNTER — Encounter (HOSPITAL_COMMUNITY): Payer: Self-pay

## 2016-07-27 ENCOUNTER — Ambulatory Visit (HOSPITAL_COMMUNITY)
Admission: RE | Admit: 2016-07-27 | Discharge: 2016-07-27 | Disposition: A | Discharge status: Home / Self Care | Payer: Medicaid Other | Source: Ambulatory Visit | Attending: Obstetrics | Admitting: Obstetrics

## 2016-07-27 ENCOUNTER — Other Ambulatory Visit (HOSPITAL_COMMUNITY): Payer: Self-pay | Admitting: Obstetrics and Gynecology

## 2016-07-27 DIAGNOSIS — O24313 Unspecified pre-existing diabetes mellitus in pregnancy, third trimester: Secondary | ICD-10-CM | POA: Diagnosis not present

## 2016-07-27 DIAGNOSIS — O09523 Supervision of elderly multigravida, third trimester: Secondary | ICD-10-CM | POA: Diagnosis not present

## 2016-07-27 DIAGNOSIS — O283 Abnormal ultrasonic finding on antenatal screening of mother: Secondary | ICD-10-CM | POA: Diagnosis not present

## 2016-07-27 DIAGNOSIS — O24319 Unspecified pre-existing diabetes mellitus in pregnancy, unspecified trimester: Secondary | ICD-10-CM

## 2016-07-27 DIAGNOSIS — O352XX0 Maternal care for (suspected) hereditary disease in fetus, not applicable or unspecified: Secondary | ICD-10-CM | POA: Insufficient documentation

## 2016-07-27 DIAGNOSIS — O99213 Obesity complicating pregnancy, third trimester: Secondary | ICD-10-CM | POA: Diagnosis not present

## 2016-07-27 DIAGNOSIS — O09293 Supervision of pregnancy with other poor reproductive or obstetric history, third trimester: Secondary | ICD-10-CM | POA: Diagnosis not present

## 2016-07-27 DIAGNOSIS — O10013 Pre-existing essential hypertension complicating pregnancy, third trimester: Secondary | ICD-10-CM | POA: Insufficient documentation

## 2016-07-27 DIAGNOSIS — Z3A35 35 weeks gestation of pregnancy: Secondary | ICD-10-CM | POA: Diagnosis not present

## 2016-07-27 DIAGNOSIS — Z7984 Long term (current) use of oral hypoglycemic drugs: Secondary | ICD-10-CM

## 2016-07-29 ENCOUNTER — Ambulatory Visit (INDEPENDENT_AMBULATORY_CARE_PROVIDER_SITE_OTHER): Payer: Medicaid Other | Admitting: Obstetrics & Gynecology

## 2016-07-29 VITALS — BP 124/78 | HR 107 | Wt 245.0 lb

## 2016-07-29 DIAGNOSIS — Z3493 Encounter for supervision of normal pregnancy, unspecified, third trimester: Secondary | ICD-10-CM | POA: Diagnosis not present

## 2016-07-29 DIAGNOSIS — O09523 Supervision of elderly multigravida, third trimester: Secondary | ICD-10-CM

## 2016-07-29 DIAGNOSIS — IMO0001 Reserved for inherently not codable concepts without codable children: Secondary | ICD-10-CM | POA: Insufficient documentation

## 2016-07-29 DIAGNOSIS — I10 Essential (primary) hypertension: Secondary | ICD-10-CM

## 2016-07-29 DIAGNOSIS — O0993 Supervision of high risk pregnancy, unspecified, third trimester: Secondary | ICD-10-CM

## 2016-07-29 DIAGNOSIS — O24419 Gestational diabetes mellitus in pregnancy, unspecified control: Secondary | ICD-10-CM

## 2016-07-29 LAB — OB RESULTS CONSOLE RPR: RPR: NONREACTIVE

## 2016-07-29 NOTE — Progress Notes (Signed)
Subjective:  Meredith York is a 40 y.o. [id number] at [redacted]w[redacted]d being seen today for ongoing prenatal care.  She is currently monitored for the following issues for this high-risk pregnancy and has Hypertension; DM (diabetes mellitus), type 2 ([place]); Advanced maternal age in multigravida; Supervision of high-risk pregnancy; History of cesarean delivery affecting pregnancy; Neonatal death; and Gestational diabetes mellitus, class B1 on her problem list.  Patient reports occasional contractions.  Contractions: Irritability. Vag. Bleeding: None.  Movement: Present. Denies leaking of fluid.   The following portions of the patient's history were reviewed and updated as appropriate: allergies, current medications, past family history, past medical history, past social history, past surgical history and problem list. Problem list updated.  Objective:   Vitals:   07/29/16 0808  BP: 124/78  Pulse: (!) 107  Weight: 245 lb (111.1 kg)    Fetal Status: Fetal Heart Rate (bpm): 155 Fundal Height: 39 cm Movement: Present     General:  Alert, oriented and cooperative. Patient is in no acute distress.  Skin: Skin is warm and dry. No rash noted.   Cardiovascular: Normal heart rate noted  Respiratory: Normal respiratory effort, no problems with respiration noted  Abdomen: Soft, gravid, appropriate for gestational age. Pain/Pressure: Present     Pelvic:  Cervical exam performed        Extremities: Normal range of motion.  Edema: Trace  Mental Status: Normal mood and affect. Normal behavior. Normal judgment and thought content.   Urinalysis: Urine Protein: Trace Urine Glucose: Negative  Assessment and Plan:  Pregnancy: [id number] at [redacted]w[redacted]d  1. Prenatal care, third trimester Prior c-section.  Scheduled for repeat with BTL after 39 weeks.  Note sent to scheduling to adjust to Sept 1st if possible.  Pt notified that she will receive a letter.  2. Supervision of high-risk pregnancy, third trimester  - Culture, beta  strep (group b only) - GC/Chlamydia Probe Amp  3. Neonatal death Pt in antenatal testing  4. Essential hypertension Well controlled on meds  5. Advanced maternal age in multigravida, third trimester  6. Gestational diabetes mellitus, class B1 Well controlled.  Log reviewed EFW >71st%ile.  [place] for growth scheduled in 5 days    Preterm labor symptoms and general obstetric precautions including but not limited to vaginal bleeding, contractions, leaking of fluid and fetal movement were reviewed in detail with the patient. Please refer to After Visit Summary for other counseling recommendations.  Return in about 1 week (around 08/05/2016).   Lavonia Drafts, MD

## 2016-07-29 NOTE — Patient Instructions (Signed)
Cesarean Delivery, Care After  Refer to this sheet in the next few weeks. These instructions provide you with information on caring for yourself after your procedure. Your health care provider may also give you specific instructions. Your treatment has been planned according to current medical practices, but problems sometimes occur. Call your health care provider if you have any problems or questions after you go home.  HOME CARE INSTRUCTIONS   Only take over-the-counter or prescription medications as directed by your health care provider.   Do not drink alcohol, especially if you are breastfeeding or taking medication to relieve pain.   Do not chew or smoke tobacco.   Continue to use good perineal care. Good perineal care includes:    Wiping your perineum from front to back.    Keeping your perineum clean.   Check your surgical cut (incision) daily for increased redness, drainage, swelling, or separation of skin.   Clean your incision gently with soap and water every day, and then pat it dry. If your health care provider says it is okay, leave the incision uncovered. Use a bandage (dressing) if the incision is draining fluid or appears irritated. If the adhesive strips across the incision do not fall off within 7 days, carefully peel them off.   Hug a pillow when coughing or sneezing until your incision is healed. This helps to relieve pain.   Do not use tampons or douche until your health care provider says it is okay.   Shower, wash your hair, and take tub baths as directed by your health care provider.   Wear a well-fitting bra that provides breast support.   Limit wearing support panties or control-top hose.   Drink enough fluids to keep your urine clear or pale yellow.   Eat high-fiber foods such as whole grain cereals and breads, brown rice, beans, and fresh fruits and vegetables every day. These foods may help prevent or relieve constipation.   Resume activities such as climbing stairs,  driving, lifting, exercising, or traveling as directed by your health care provider.   Talk to your health care provider about resuming sexual activities. This is dependent upon your risk of infection, your rate of healing, and your comfort and desire to resume sexual activity.   Try to have someone help you with your household activities and your newborn for at least a few days after you leave the hospital.   Rest as much as possible. Try to rest or take a nap when your newborn is sleeping.   Increase your activities gradually.   Keep all of your scheduled postpartum appointments. It is very important to keep your scheduled follow-up appointments. At these appointments, your health care provider will be checking to make sure that you are healing physically and emotionally.  SEEK MEDICAL CARE IF:    You are passing large clots from your vagina. Save any clots to show your health care provider.   You have a foul smelling discharge from your vagina.   You have trouble urinating.   You are urinating frequently.   You have pain when you urinate.   You have a change in your bowel movements.   You have increasing redness, pain, or swelling near your incision.   You have pus draining from your incision.   Your incision is separating.   You have painful, hard, or reddened breasts.   You have a severe headache.   You have blurred vision or see spots.   You feel sad   or depressed.   You have thoughts of hurting yourself or your newborn.   You have questions about your care, the care of your newborn, or medications.   You are dizzy or light-headed.   You have a rash.   You have pain, redness, or swelling at the site of the removed intravenous access (IV) tube.   You have nausea or vomiting.   You stopped breastfeeding and have not had a menstrual period within 12 weeks of stopping.   You are not breastfeeding and have not had a menstrual period within 12 weeks of delivery.   You have a fever.  SEEK  IMMEDIATE MEDICAL CARE IF:   You have persistent pain.   You have chest pain.   You have shortness of breath.   You faint.   You have leg pain.   You have stomach pain.   Your vaginal bleeding saturates 2 or more sanitary pads in 1 hour.  MAKE SURE YOU:    Understand these instructions.   Will watch your condition.   Will get help right away if you are not doing well or get worse.     This information is not intended to replace advice given to you by your health care provider. Make sure you discuss any questions you have with your health care provider.     Document Released: 08/29/2002 Document Revised: 12/28/2014 Document Reviewed: 08/03/2012  Elsevier Interactive Patient Education 2016 [employer]

## 2016-07-30 ENCOUNTER — Encounter (HOSPITAL_COMMUNITY): Payer: Self-pay | Admitting: *Deleted

## 2016-07-30 ENCOUNTER — Ambulatory Visit (HOSPITAL_COMMUNITY): Payer: Medicaid Other

## 2016-07-30 LAB — GC/CHLAMYDIA PROBE AMP
Chlamydia trachomatis, NAA: NEGATIVE
Neisseria gonorrhoeae by PCR: NEGATIVE

## 2016-08-02 ENCOUNTER — Encounter (HOSPITAL_COMMUNITY): Payer: Self-pay

## 2016-08-02 ENCOUNTER — Inpatient Hospital Stay (HOSPITAL_COMMUNITY)
Admission: AD | Admit: 2016-08-02 | Discharge: 2016-08-06 | DRG: 765 | Disposition: A | Discharge status: Home / Self Care | Payer: Medicaid Other | Source: Ambulatory Visit | Attending: Obstetrics & Gynecology | Admitting: Obstetrics & Gynecology

## 2016-08-02 DIAGNOSIS — E119 Type 2 diabetes mellitus without complications: Secondary | ICD-10-CM

## 2016-08-02 DIAGNOSIS — IMO0001 Reserved for inherently not codable concepts without codable children: Secondary | ICD-10-CM | POA: Diagnosis present

## 2016-08-02 DIAGNOSIS — O1002 Pre-existing essential hypertension complicating childbirth: Secondary | ICD-10-CM | POA: Diagnosis present

## 2016-08-02 DIAGNOSIS — O2412 Pre-existing diabetes mellitus, type 2, in childbirth: Secondary | ICD-10-CM | POA: Diagnosis present

## 2016-08-02 DIAGNOSIS — O99214 Obesity complicating childbirth: Secondary | ICD-10-CM | POA: Diagnosis present

## 2016-08-02 DIAGNOSIS — Z98891 History of uterine scar from previous surgery: Secondary | ICD-10-CM

## 2016-08-02 DIAGNOSIS — Z87891 Personal history of nicotine dependence: Secondary | ICD-10-CM

## 2016-08-02 DIAGNOSIS — Z7982 Long term (current) use of aspirin: Secondary | ICD-10-CM

## 2016-08-02 DIAGNOSIS — Z3A36 36 weeks gestation of pregnancy: Secondary | ICD-10-CM

## 2016-08-02 DIAGNOSIS — Z6841 Body Mass Index (BMI) 40.0 and over, adult: Secondary | ICD-10-CM

## 2016-08-02 DIAGNOSIS — D62 Acute posthemorrhagic anemia: Secondary | ICD-10-CM | POA: Diagnosis not present

## 2016-08-02 DIAGNOSIS — O9081 Anemia of the puerperium: Secondary | ICD-10-CM | POA: Diagnosis not present

## 2016-08-02 DIAGNOSIS — Z8249 Family history of ischemic heart disease and other diseases of the circulatory system: Secondary | ICD-10-CM

## 2016-08-02 DIAGNOSIS — Z7984 Long term (current) use of oral hypoglycemic drugs: Secondary | ICD-10-CM

## 2016-08-02 DIAGNOSIS — I10 Essential (primary) hypertension: Secondary | ICD-10-CM

## 2016-08-02 DIAGNOSIS — Z302 Encounter for sterilization: Secondary | ICD-10-CM

## 2016-08-02 DIAGNOSIS — O34211 Maternal care for low transverse scar from previous cesarean delivery: Principal | ICD-10-CM | POA: Diagnosis present

## 2016-08-02 LAB — CBC
HCT: 29.9 % — ABNORMAL LOW (ref 36.0–46.0)
Hemoglobin: 9.9 g/dL — ABNORMAL LOW (ref 12.0–15.0)
MCH: 27 pg (ref 26.0–34.0)
MCHC: 33.1 g/dL (ref 30.0–36.0)
MCV: 81.7 fL (ref 78.0–100.0)
PLATELETS: 324 10*3/uL (ref 150–400)
RBC: 3.66 MIL/uL — ABNORMAL LOW (ref 3.87–5.11)
RDW: 15.2 % (ref 11.5–15.5)
WBC: 9.7 10*3/uL (ref 4.0–10.5)

## 2016-08-02 LAB — POCT FERN TEST: POCT Fern Test: POSITIVE

## 2016-08-02 LAB — CULTURE, BETA STREP (GROUP B ONLY): STREP GP B CULTURE: NEGATIVE

## 2016-08-02 MED ORDER — BETAMETHASONE SOD PHOS & ACET 6 (3-3) MG/ML IJ SUSP
12.0000 mg | Freq: Once | INTRAMUSCULAR | Status: AC
  Administered 2016-08-03: 12 mg via INTRAMUSCULAR
  Filled 2016-08-02: qty 2

## 2016-08-02 MED ORDER — LACTATED RINGERS IV SOLN
INTRAVENOUS | Status: DC
  Administered 2016-08-02: 125 mL/h via INTRAVENOUS
  Administered 2016-08-03 (×2): via INTRAVENOUS

## 2016-08-02 MED ORDER — SOD CITRATE-CITRIC ACID 500-334 MG/5ML PO SOLN
30.0000 mL | Freq: Once | ORAL | Status: AC
  Administered 2016-08-03: 30 mL via ORAL
  Filled 2016-08-02: qty 15

## 2016-08-02 MED ORDER — FAMOTIDINE IN NACL 20-0.9 MG/50ML-% IV SOLN
20.0000 mg | Freq: Once | INTRAVENOUS | Status: AC
  Administered 2016-08-03: 20 mg via INTRAVENOUS
  Filled 2016-08-02: qty 50

## 2016-08-03 ENCOUNTER — Encounter (HOSPITAL_COMMUNITY)
Admission: AD | Disposition: A | Discharge status: Home / Self Care | Payer: Self-pay | Source: Ambulatory Visit | Attending: Obstetrics & Gynecology

## 2016-08-03 ENCOUNTER — Inpatient Hospital Stay (HOSPITAL_COMMUNITY): Payer: Medicaid Other | Admitting: Anesthesiology

## 2016-08-03 ENCOUNTER — Ambulatory Visit (HOSPITAL_COMMUNITY): Admission: RE | Admit: 2016-08-03 | Payer: Medicaid Other | Source: Ambulatory Visit

## 2016-08-03 ENCOUNTER — Encounter (HOSPITAL_COMMUNITY): Payer: Self-pay | Admitting: Anesthesiology

## 2016-08-03 ENCOUNTER — Ambulatory Visit (HOSPITAL_COMMUNITY)
Admission: RE | Admit: 2016-08-03 | Payer: Medicaid Other | Source: Ambulatory Visit | Attending: Obstetrics | Admitting: Obstetrics

## 2016-08-03 ENCOUNTER — Other Ambulatory Visit (HOSPITAL_COMMUNITY): Payer: Medicaid Other

## 2016-08-03 ENCOUNTER — Ambulatory Visit (HOSPITAL_COMMUNITY): Payer: Medicaid Other

## 2016-08-03 ENCOUNTER — Encounter: Payer: Medicaid Other | Admitting: Obstetrics & Gynecology

## 2016-08-03 ENCOUNTER — Inpatient Hospital Stay (HOSPITAL_COMMUNITY)
Admission: RE | Admit: 2016-08-03 | Payer: Medicaid Other | Source: Ambulatory Visit | Attending: Obstetrics | Admitting: Obstetrics

## 2016-08-03 DIAGNOSIS — D62 Acute posthemorrhagic anemia: Secondary | ICD-10-CM

## 2016-08-03 DIAGNOSIS — O1002 Pre-existing essential hypertension complicating childbirth: Secondary | ICD-10-CM | POA: Diagnosis not present

## 2016-08-03 DIAGNOSIS — O34211 Maternal care for low transverse scar from previous cesarean delivery: Secondary | ICD-10-CM | POA: Diagnosis not present

## 2016-08-03 DIAGNOSIS — E119 Type 2 diabetes mellitus without complications: Secondary | ICD-10-CM | POA: Diagnosis not present

## 2016-08-03 DIAGNOSIS — O99214 Obesity complicating childbirth: Secondary | ICD-10-CM | POA: Diagnosis not present

## 2016-08-03 DIAGNOSIS — Z3A36 36 weeks gestation of pregnancy: Secondary | ICD-10-CM

## 2016-08-03 DIAGNOSIS — Z6841 Body Mass Index (BMI) 40.0 and over, adult: Secondary | ICD-10-CM | POA: Diagnosis not present

## 2016-08-03 DIAGNOSIS — Z8249 Family history of ischemic heart disease and other diseases of the circulatory system: Secondary | ICD-10-CM | POA: Diagnosis not present

## 2016-08-03 DIAGNOSIS — IMO0001 Reserved for inherently not codable concepts without codable children: Secondary | ICD-10-CM

## 2016-08-03 DIAGNOSIS — Z87891 Personal history of nicotine dependence: Secondary | ICD-10-CM | POA: Diagnosis not present

## 2016-08-03 DIAGNOSIS — O2412 Pre-existing diabetes mellitus, type 2, in childbirth: Secondary | ICD-10-CM | POA: Diagnosis not present

## 2016-08-03 DIAGNOSIS — Z7984 Long term (current) use of oral hypoglycemic drugs: Secondary | ICD-10-CM | POA: Diagnosis not present

## 2016-08-03 DIAGNOSIS — Z7982 Long term (current) use of aspirin: Secondary | ICD-10-CM | POA: Diagnosis not present

## 2016-08-03 DIAGNOSIS — O9081 Anemia of the puerperium: Secondary | ICD-10-CM | POA: Diagnosis not present

## 2016-08-03 DIAGNOSIS — Z302 Encounter for sterilization: Secondary | ICD-10-CM | POA: Diagnosis not present

## 2016-08-03 LAB — GLUCOSE, CAPILLARY
GLUCOSE-CAPILLARY: 172 mg/dL — AB (ref 65–99)
GLUCOSE-CAPILLARY: 135 mg/dL — AB (ref 65–99)
Glucose-Capillary: 119 mg/dL — ABNORMAL HIGH (ref 65–99)

## 2016-08-03 LAB — RPR: RPR: NONREACTIVE

## 2016-08-03 LAB — TYPE AND SCREEN
ABO/RH(D): O POS
Antibody Screen: NEGATIVE

## 2016-08-03 SURGERY — Surgical Case
Anesthesia: Spinal

## 2016-08-03 MED ORDER — MEASLES, MUMPS & RUBELLA VAC ~~LOC~~ INJ
0.5000 mL | INJECTION | Freq: Once | SUBCUTANEOUS | Status: DC
  Filled 2016-08-03: qty 0.5

## 2016-08-03 MED ORDER — TERBUTALINE SULFATE 1 MG/ML IJ SOLN
0.2500 mg | Freq: Once | INTRAMUSCULAR | Status: AC
  Administered 2016-08-03: 0.25 mg via SUBCUTANEOUS
  Filled 2016-08-03: qty 1

## 2016-08-03 MED ORDER — FENTANYL CITRATE (PF) 250 MCG/5ML IJ SOLN
INTRAMUSCULAR | Status: AC
  Filled 2016-08-03: qty 5

## 2016-08-03 MED ORDER — MIDAZOLAM HCL 5 MG/5ML IJ SOLN
INTRAMUSCULAR | Status: DC | PRN
  Administered 2016-08-03 (×2): 1 mg via INTRAVENOUS

## 2016-08-03 MED ORDER — PNEUMOCOCCAL VAC POLYVALENT 25 MCG/0.5ML IJ INJ
0.5000 mL | INJECTION | INTRAMUSCULAR | Status: AC
  Administered 2016-08-04: 0.5 mL via INTRAMUSCULAR
  Filled 2016-08-03 (×2): qty 0.5

## 2016-08-03 MED ORDER — OXYCODONE-ACETAMINOPHEN 5-325 MG PO TABS
1.0000 | ORAL_TABLET | ORAL | Status: DC | PRN
Start: 2016-08-03 — End: 2016-08-03

## 2016-08-03 MED ORDER — IBUPROFEN 600 MG PO TABS: 600.0000 mg | ORAL_TABLET | Freq: Four times a day (QID) | ORAL | Status: DC | PRN

## 2016-08-03 MED ORDER — LACTATED RINGERS IV SOLN
500.0000 mL | INTRAVENOUS | Status: DC | PRN
Start: 2016-08-03 — End: 2016-08-05

## 2016-08-03 MED ORDER — ONDANSETRON HCL 4 MG/2ML IJ SOLN: 4.0000 mg | Freq: Three times a day (TID) | INTRAMUSCULAR | Status: DC | PRN

## 2016-08-03 MED ORDER — MEPERIDINE HCL 25 MG/ML IJ SOLN: 6.2500 mg | INTRAMUSCULAR | Status: DC | PRN

## 2016-08-03 MED ORDER — CEFAZOLIN SODIUM-DEXTROSE 2-4 GM/100ML-% IV SOLN: 2.0000 g | INTRAVENOUS | Status: DC

## 2016-08-03 MED ORDER — BUPIVACAINE HCL (PF) 0.5 % IJ SOLN
INTRAMUSCULAR | Status: AC
  Filled 2016-08-03: qty 30

## 2016-08-03 MED ORDER — MENTHOL 3 MG MT LOZG: 1.0000 | LOZENGE | OROMUCOSAL | Status: DC | PRN

## 2016-08-03 MED ORDER — LACTATED RINGERS IV SOLN
INTRAVENOUS | Status: DC
  Administered 2016-08-03: 12:00:00 via INTRAVENOUS

## 2016-08-03 MED ORDER — KETAMINE HCL 10 MG/ML IJ SOLN
INTRAMUSCULAR | Status: AC
  Filled 2016-08-03: qty 1

## 2016-08-03 MED ORDER — ENALAPRIL MALEATE 10 MG PO TABS
10.0000 mg | ORAL_TABLET | Freq: Every day | ORAL | Status: DC
  Administered 2016-08-03 – 2016-08-05 (×3): 10 mg via ORAL
  Filled 2016-08-03 (×5): qty 1

## 2016-08-03 MED ORDER — BUPIVACAINE HCL (PF) 0.5 % IJ SOLN
INTRAMUSCULAR | Status: DC | PRN
  Administered 2016-08-03: 30 mL

## 2016-08-03 MED ORDER — FLEET ENEMA 7-19 GM/118ML RE ENEM: 1.0000 | ENEMA | RECTAL | Status: DC | PRN

## 2016-08-03 MED ORDER — PROMETHAZINE HCL 25 MG/ML IJ SOLN: 6.2500 mg | INTRAMUSCULAR | Status: DC | PRN

## 2016-08-03 MED ORDER — OXYCODONE-ACETAMINOPHEN 5-325 MG PO TABS: 2.0000 | ORAL_TABLET | ORAL | Status: DC | PRN

## 2016-08-03 MED ORDER — KETAMINE HCL 10 MG/ML IJ SOLN
INTRAMUSCULAR | Status: DC | PRN
  Administered 2016-08-03: 10 mg via INTRAVENOUS
  Administered 2016-08-03: 30 mg via INTRAVENOUS
  Administered 2016-08-03: 40 mg via INTRAVENOUS
  Administered 2016-08-03: 20 mg via INTRAVENOUS

## 2016-08-03 MED ORDER — HYDROMORPHONE HCL 1 MG/ML IJ SOLN: 0.2500 mg | INTRAMUSCULAR | Status: DC | PRN

## 2016-08-03 MED ORDER — MORPHINE SULFATE-NACL 0.5-0.9 MG/ML-% IV SOSY
PREFILLED_SYRINGE | INTRAVENOUS | Status: AC
  Filled 2016-08-03: qty 1

## 2016-08-03 MED ORDER — NALBUPHINE HCL 10 MG/ML IJ SOLN: 5.0000 mg | INTRAMUSCULAR | Status: DC | PRN

## 2016-08-03 MED ORDER — TETANUS-DIPHTH-ACELL PERTUSSIS 5-2.5-18.5 LF-MCG/0.5 IM SUSP
0.5000 mL | Freq: Once | INTRAMUSCULAR | Status: AC
  Administered 2016-08-04: 0.5 mL via INTRAMUSCULAR
  Filled 2016-08-03: qty 0.5

## 2016-08-03 MED ORDER — OXYTOCIN 40 UNITS IN LACTATED RINGERS INFUSION - SIMPLE MED
2.5000 [IU]/h | INTRAVENOUS | Status: AC
Start: 2016-08-03 — End: 2016-08-03

## 2016-08-03 MED ORDER — FENTANYL CITRATE (PF) 100 MCG/2ML IJ SOLN
INTRAMUSCULAR | Status: DC | PRN
  Administered 2016-08-03: 20 ug via INTRATHECAL
  Administered 2016-08-03: 80 ug via INTRAVENOUS
  Administered 2016-08-03 (×2): 100 ug via INTRAVENOUS
  Administered 2016-08-03: 50 ug via INTRAVENOUS

## 2016-08-03 MED ORDER — SCOPOLAMINE 1 MG/3DAYS TD PT72
MEDICATED_PATCH | TRANSDERMAL | Status: DC | PRN
  Administered 2016-08-03: 1 via TRANSDERMAL

## 2016-08-03 MED ORDER — OXYTOCIN 40 UNITS IN LACTATED RINGERS INFUSION - SIMPLE MED: 2.5000 [IU]/h | INTRAVENOUS | Status: DC

## 2016-08-03 MED ORDER — SOD CITRATE-CITRIC ACID 500-334 MG/5ML PO SOLN: 30.0000 mL | ORAL | Status: DC

## 2016-08-03 MED ORDER — MAGNESIUM HYDROXIDE 400 MG/5ML PO SUSP: 30.0000 mL | ORAL | Status: DC | PRN

## 2016-08-03 MED ORDER — ACETAMINOPHEN 500 MG PO TABS
1000.0000 mg | ORAL_TABLET | Freq: Four times a day (QID) | ORAL | Status: AC
  Administered 2016-08-03 (×2): 1000 mg via ORAL
  Filled 2016-08-03 (×2): qty 2

## 2016-08-03 MED ORDER — ZOLPIDEM TARTRATE 5 MG PO TABS: 5.0000 mg | ORAL_TABLET | Freq: Every evening | ORAL | Status: DC | PRN

## 2016-08-03 MED ORDER — DIPHENHYDRAMINE HCL 25 MG PO CAPS: 25.0000 mg | ORAL_CAPSULE | Freq: Four times a day (QID) | ORAL | Status: DC | PRN

## 2016-08-03 MED ORDER — MIDAZOLAM HCL 2 MG/2ML IJ SOLN
INTRAMUSCULAR | Status: AC
  Filled 2016-08-03: qty 2

## 2016-08-03 MED ORDER — DIPHENHYDRAMINE HCL 50 MG/ML IJ SOLN: 12.5000 mg | INTRAMUSCULAR | Status: DC | PRN

## 2016-08-03 MED ORDER — DIPHENHYDRAMINE HCL 25 MG PO CAPS
25.0000 mg | ORAL_CAPSULE | ORAL | Status: DC | PRN
  Administered 2016-08-03: 25 mg via ORAL
  Filled 2016-08-03: qty 1

## 2016-08-03 MED ORDER — KETOROLAC TROMETHAMINE 30 MG/ML IJ SOLN
30.0000 mg | Freq: Four times a day (QID) | INTRAMUSCULAR | Status: AC | PRN
  Administered 2016-08-03: 30 mg via INTRAMUSCULAR

## 2016-08-03 MED ORDER — OXYCODONE-ACETAMINOPHEN 5-325 MG PO TABS
1.0000 | ORAL_TABLET | ORAL | Status: DC | PRN
  Administered 2016-08-03 – 2016-08-05 (×2): 1 via ORAL
  Filled 2016-08-03 (×2): qty 1

## 2016-08-03 MED ORDER — NALBUPHINE HCL 10 MG/ML IJ SOLN: 5.0000 mg | Freq: Once | INTRAMUSCULAR | Status: DC | PRN

## 2016-08-03 MED ORDER — LABETALOL HCL 5 MG/ML IV SOLN
INTRAVENOUS | Status: DC | PRN
  Administered 2016-08-03: 10 mg via INTRAVENOUS

## 2016-08-03 MED ORDER — NALOXONE HCL 2 MG/2ML IJ SOSY
1.0000 ug/kg/h | PREFILLED_SYRINGE | INTRAVENOUS | Status: DC | PRN
  Filled 2016-08-03: qty 2

## 2016-08-03 MED ORDER — PRENATAL MULTIVITAMIN CH
1.0000 | ORAL_TABLET | Freq: Every day | ORAL | Status: DC
  Administered 2016-08-03 – 2016-08-06 (×4): 1 via ORAL
  Filled 2016-08-03 (×4): qty 1

## 2016-08-03 MED ORDER — FERROUS SULFATE 325 (65 FE) MG PO TABS
325.0000 mg | ORAL_TABLET | Freq: Two times a day (BID) | ORAL | Status: DC
  Administered 2016-08-03 – 2016-08-06 (×7): 325 mg via ORAL
  Filled 2016-08-03 (×7): qty 1

## 2016-08-03 MED ORDER — OXYCODONE-ACETAMINOPHEN 5-325 MG PO TABS
2.0000 | ORAL_TABLET | ORAL | Status: DC | PRN
  Administered 2016-08-04: 2 via ORAL
  Filled 2016-08-03: qty 2

## 2016-08-03 MED ORDER — COCONUT OIL OIL: 1.0000 "application " | TOPICAL_OIL | Status: DC | PRN

## 2016-08-03 MED ORDER — SOD CITRATE-CITRIC ACID 500-334 MG/5ML PO SOLN: 30.0000 mL | ORAL | Status: DC | PRN

## 2016-08-03 MED ORDER — ONDANSETRON HCL 4 MG/2ML IJ SOLN: 4.0000 mg | Freq: Four times a day (QID) | INTRAMUSCULAR | Status: DC | PRN

## 2016-08-03 MED ORDER — IBUPROFEN 600 MG PO TABS
600.0000 mg | ORAL_TABLET | Freq: Four times a day (QID) | ORAL | Status: DC
  Administered 2016-08-03 – 2016-08-06 (×13): 600 mg via ORAL
  Filled 2016-08-03 (×13): qty 1

## 2016-08-03 MED ORDER — LACTATED RINGERS IV SOLN
INTRAVENOUS | Status: DC | PRN
  Administered 2016-08-03: 02:00:00 via INTRAVENOUS

## 2016-08-03 MED ORDER — OXYTOCIN BOLUS FROM INFUSION: 500.0000 mL | Freq: Once | INTRAVENOUS | Status: DC

## 2016-08-03 MED ORDER — DEXTROSE 5 % IV SOLN: INTRAVENOUS | Status: DC | PRN

## 2016-08-03 MED ORDER — ACETAMINOPHEN 325 MG PO TABS: 650.0000 mg | ORAL_TABLET | ORAL | Status: DC | PRN

## 2016-08-03 MED ORDER — GENTAMICIN SULFATE 40 MG/ML IJ SOLN
INTRAVENOUS | Status: AC
  Administered 2016-08-03: 110 mL via INTRAVENOUS
  Filled 2016-08-03: qty 4.25

## 2016-08-03 MED ORDER — ONDANSETRON HCL 4 MG/2ML IJ SOLN
INTRAMUSCULAR | Status: DC | PRN
  Administered 2016-08-03: 4 mg via INTRAVENOUS

## 2016-08-03 MED ORDER — FENTANYL CITRATE (PF) 100 MCG/2ML IJ SOLN
INTRAMUSCULAR | Status: AC
  Filled 2016-08-03: qty 2

## 2016-08-03 MED ORDER — BUPIVACAINE IN DEXTROSE 0.75-8.25 % IT SOLN
INTRATHECAL | Status: DC | PRN
  Administered 2016-08-03: 1.5 mL via INTRATHECAL

## 2016-08-03 MED ORDER — LIDOCAINE HCL (PF) 1 % IJ SOLN
30.0000 mL | INTRAMUSCULAR | Status: DC | PRN
  Filled 2016-08-03: qty 30

## 2016-08-03 MED ORDER — GLYBURIDE 2.5 MG PO TABS
2.5000 mg | ORAL_TABLET | Freq: Two times a day (BID) | ORAL | Status: DC
  Administered 2016-08-03 – 2016-08-05 (×5): 2.5 mg via ORAL
  Filled 2016-08-03 (×7): qty 1

## 2016-08-03 MED ORDER — NALOXONE HCL 0.4 MG/ML IJ SOLN: 0.4000 mg | INTRAMUSCULAR | Status: DC | PRN

## 2016-08-03 MED ORDER — MORPHINE SULFATE (PF) 0.5 MG/ML IJ SOLN
INTRAMUSCULAR | Status: DC | PRN
  Administered 2016-08-03: .3 mg via INTRAVENOUS
  Administered 2016-08-03: .2 mg via INTRATHECAL

## 2016-08-03 MED ORDER — KETOROLAC TROMETHAMINE 30 MG/ML IJ SOLN: 30.0000 mg | Freq: Once | INTRAMUSCULAR | Status: DC

## 2016-08-03 MED ORDER — WITCH HAZEL-GLYCERIN EX PADS: 1.0000 "application " | MEDICATED_PAD | CUTANEOUS | Status: DC | PRN

## 2016-08-03 MED ORDER — SIMETHICONE 80 MG PO CHEW
80.0000 mg | CHEWABLE_TABLET | ORAL | Status: DC | PRN
Start: 2016-08-03 — End: 2016-08-06
  Administered 2016-08-03: 80 mg via ORAL
  Filled 2016-08-03: qty 1

## 2016-08-03 MED ORDER — OXYTOCIN 10 UNIT/ML IJ SOLN
INTRAVENOUS | Status: DC | PRN
  Administered 2016-08-03: 40 [IU] via INTRAVENOUS

## 2016-08-03 MED ORDER — SENNOSIDES-DOCUSATE SODIUM 8.6-50 MG PO TABS
2.0000 | ORAL_TABLET | ORAL | Status: DC
  Administered 2016-08-04 – 2016-08-06 (×3): 2 via ORAL
  Filled 2016-08-03 (×3): qty 2

## 2016-08-03 MED ORDER — SODIUM CHLORIDE 0.9% FLUSH: 3.0000 mL | INTRAVENOUS | Status: DC | PRN

## 2016-08-03 MED ORDER — LACTATED RINGERS IV SOLN: INTRAVENOUS | Status: DC

## 2016-08-03 MED ORDER — KETOROLAC TROMETHAMINE 30 MG/ML IJ SOLN
30.0000 mg | Freq: Four times a day (QID) | INTRAMUSCULAR | Status: AC | PRN
Start: 2016-08-03 — End: 2016-08-04

## 2016-08-03 MED ORDER — KETOROLAC TROMETHAMINE 30 MG/ML IJ SOLN
INTRAMUSCULAR | Status: AC
  Filled 2016-08-03: qty 1

## 2016-08-03 MED ORDER — PHENYLEPHRINE 8 MG IN D5W 100 ML (0.08MG/ML) PREMIX OPTIME
INJECTION | INTRAVENOUS | Status: AC
  Filled 2016-08-03: qty 100

## 2016-08-03 MED ORDER — SCOPOLAMINE 1 MG/3DAYS TD PT72
1.0000 | MEDICATED_PATCH | Freq: Once | TRANSDERMAL | Status: DC
  Filled 2016-08-03: qty 1

## 2016-08-03 MED ORDER — DIBUCAINE 1 % RE OINT: 1.0000 "application " | TOPICAL_OINTMENT | RECTAL | Status: DC | PRN

## 2016-08-03 MED ORDER — LABETALOL HCL 5 MG/ML IV SOLN
INTRAVENOUS | Status: AC
  Filled 2016-08-03: qty 4

## 2016-08-03 MED ORDER — SIMETHICONE 80 MG PO CHEW
80.0000 mg | CHEWABLE_TABLET | ORAL | Status: DC
  Administered 2016-08-04 – 2016-08-06 (×3): 80 mg via ORAL
  Filled 2016-08-03 (×3): qty 1

## 2016-08-03 SURGICAL SUPPLY — 29 items
CHLORAPREP W/TINT 26ML (MISCELLANEOUS) ×2 IMPLANT
CLAMP CORD UMBIL (MISCELLANEOUS) IMPLANT
CLOTH BEACON ORANGE TIMEOUT ST (SAFETY) ×2 IMPLANT
DRSG OPSITE POSTOP 4X10 (GAUZE/BANDAGES/DRESSINGS) ×2 IMPLANT
ELECT REM PT RETURN 9FT ADLT (ELECTROSURGICAL) ×2
ELECTRODE REM PT RTRN 9FT ADLT (ELECTROSURGICAL) ×1 IMPLANT
EXTRACTOR VACUUM M CUP 4 TUBE (SUCTIONS) IMPLANT
GLOVE BIOGEL PI IND STRL 7.0 (GLOVE) ×3 IMPLANT
GLOVE BIOGEL PI INDICATOR 7.0 (GLOVE) ×3
GLOVE ECLIPSE 7.0 STRL STRAW (GLOVE) ×2 IMPLANT
GOWN STRL REUS W/TWL LRG LVL3 (GOWN DISPOSABLE) ×4 IMPLANT
KIT ABG SYR 3ML LUER SLIP (SYRINGE) IMPLANT
NDL HYPO 25X5/8 SAFETYGLIDE (NEEDLE) ×1 IMPLANT
NEEDLE HYPO 22GX1.5 SAFETY (NEEDLE) ×2 IMPLANT
NEEDLE HYPO 25X5/8 SAFETYGLIDE (NEEDLE) ×2 IMPLANT
NS IRRIG 1000ML POUR BTL (IV SOLUTION) ×2 IMPLANT
PACK C SECTION WH (CUSTOM PROCEDURE TRAY) ×2 IMPLANT
PAD ABD 7.5X8 STRL (GAUZE/BANDAGES/DRESSINGS) ×2 IMPLANT
PAD OB MATERNITY 4.3X12.25 (PERSONAL CARE ITEMS) ×2 IMPLANT
PENCIL SMOKE EVAC W/HOLSTER (ELECTROSURGICAL) ×2 IMPLANT
RTRCTR C-SECT PINK 25CM LRG (MISCELLANEOUS) IMPLANT
SUT PDS AB 0 CTX 36 PDP370T (SUTURE) ×2 IMPLANT
SUT PLAIN 2 0 XLH (SUTURE) IMPLANT
SUT VIC AB 0 CTX 36 (SUTURE) ×6
SUT VIC AB 0 CTX36XBRD ANBCTRL (SUTURE) ×3 IMPLANT
SUT VIC AB 4-0 KS 27 (SUTURE) ×2 IMPLANT
SYR CONTROL 10ML LL (SYRINGE) ×2 IMPLANT
TOWEL OR 17X24 6PK STRL BLUE (TOWEL DISPOSABLE) ×2 IMPLANT
TRAY FOLEY CATH SILVER 14FR (SET/KITS/TRAYS/PACK) ×2 IMPLANT

## 2016-08-03 NOTE — Anesthesia Postprocedure Evaluation (Signed)
Anesthesia Post Note  Patient: Meredith York  Procedure(s) Performed: Procedure(s) (LRB): CESAREAN SECTION (N/A)  Patient location during evaluation: PACU Anesthesia Type: Spinal Level of consciousness: awake Pain management: pain level controlled Vital Signs Assessment: post-procedure vital signs reviewed and stable Respiratory status: spontaneous breathing Cardiovascular status: stable Postop Assessment: no headache, no backache, spinal receding, patient able to bend at knees and no signs of nausea or vomiting Anesthetic complications: no     Last Vitals:  Vitals:   08/03/16 0401 08/03/16 0402  BP:    Pulse: 88 92  Resp: 15   Temp:      Last Pain:  Vitals:   08/03/16 0400  TempSrc: Oral  PainSc:    Pain Goal:                 Skyley Grandmaison JR,JOHN Mehek Grega

## 2016-08-03 NOTE — Lactation Note (Signed)
This note was copied from a baby's chart. Lactation Consultation Note  Patient Name: Meredith York [id number] Date: 08/03/2016 Reason for consult: Initial assessment;NICU baby   Initial consult with mom of 1 hour old infant. Mom reports she has been pumping every 3 hours and not getting colostrum at this time. She reports she was shown how to hand express and was able to return demonstration. No colostrum was seen. Mom has colostrum collection containers, # stickers, BM labels at bedside. Reviewed Providing Milk for Your Baby in NICU Booklet with mom. Enc mom to pump every 2-3 hours for 15 minutes in Initiate setting with DEBP followed by hand expression.   Mom is a [facility] client and does not have a pump at home. [place] referral faxed to [facility] office and mom was made aware of [facility] Loaner pump program. Hand Expression and BF Resources Handouts given. [facility] Brochure given, mom informed of [place] phone #, BF Support Groups and IP/OP Services. Enc mom to call with questions/concerns prn.    Maternal Data Formula Feeding for Exclusion: No Has patient been taught Hand Expression?: Yes Does the patient have breastfeeding experience prior to this delivery?: No (did not BF 40 yo and said 40 yo would not latch)  Feeding Feeding Type: Formula Length of feed: 30 min  LATCH Score/Interventions                      Lactation Tools Discussed/Used WIC Program: Yes Pump Review: Setup, frequency, and cleaning;Milk Storage Initiated by:: Bedside RN   Consult Status Consult Status: Follow-up Date: 07/28/16 Follow-up type: In-patient    Debby Freiberg [name] 08/03/2016, 3:42 PM

## 2016-08-03 NOTE — Addendum Note (Signed)
Addendum  created 08/03/16 0744 by Riki Sheer, CRNA   Sign clinical note

## 2016-08-03 NOTE — Consult Note (Addendum)
Neonatology Note:   Attendance at C-section:    I was asked by Dr. Harolyn Rutherford to attend this repeat C/S at 36 4/[redacted] weeks GA after onset of preterm labor, and NRFHR . The mother is a G6P3A2 O pos, GBS neg with chronic HTN, on Labetalol, and Type 2 DM, on Glyburide. She got one dose each of Betamethasone, Gentamicin, and Clindamycin 1 hour before delivery. ROM 4 hours prior to delivery, fluid bloody. Infant vigorous with good spontaneous cry and tone. Delayed cord clamping was done. Needed only minimal bulb suctioning. Ap 8/8. Lungs clear to ausc in DR, no distress. However, the baby was still slightly dusky at 5 minutes, so we placed a pulse oximeter, showing saturations of 44% in room air. BBO2 was given, with increase in the O2 saturation to 88-90%, but requiring 100% FIO2. History of sibling with severe PPHN at term who expired. Infant was transferred on 100% BBO2 to NICU for further care, with her grandmother in attendance.   Real Cons, MD

## 2016-08-03 NOTE — Anesthesia Procedure Notes (Signed)
Spinal  Patient location during procedure: OR Start time: 08/03/2016 1:00 AM End time: 08/03/2016 1:03 AM Staffing Anesthesiologist: Lyn Hollingshead Performed: anesthesiologist  Preanesthetic Checklist Completed: patient identified, surgical consent, pre-op evaluation, timeout performed, IV checked, risks and benefits discussed and monitors and equipment checked Spinal Block Patient position: sitting Prep: site prepped and draped and DuraPrep Patient monitoring: heart rate, cardiac monitor, continuous pulse ox and blood pressure Approach: midline Location: L3-4 Injection technique: single-shot Needle Needle type: Pencan  Needle gauge: 24 G Needle length: 9 cm Needle insertion depth: 9 cm Assessment Sensory level: T4

## 2016-08-03 NOTE — Plan of Care (Signed)
Problem: Education: Goal: Knowledge of condition will improve Outcome: Completed/Met Date Met: 08/03/16 Plan of care discussed with patient and what to expect for the next couple of days.  Problem: Coping: Goal: Ability to cope will improve Outcome: Completed/Met Date Met: 08/03/16 Understands why her baby is in NICU.  Problem: Respiratory: Goal: Ability to maintain adequate ventilation will improve Outcome: Completed/Met Date Met: 08/03/16 Shown how to use her I/S and she can get it up to 1750 x 5.  Problem: Skin Integrity: Goal: Demonstration of wound healing without infection will improve Outcome: Completed/Met Date Met: 08/03/16 Incision care discussed with patient.

## 2016-08-03 NOTE — Plan of Care (Signed)
Problem: Life Cycle: Goal: Risk for postpartum hemorrhage will decrease Outcome: Progressing Vaginal bleeding is minimal at this time.

## 2016-08-03 NOTE — Op Note (Signed)
Meredith York PROCEDURE DATE: 08/03/2016  PREOPERATIVE DIAGNOSES: Intrauterine pregnancy at [redacted]w[redacted]d weeks gestation; previous cesarean section x 1; declined TOLAC; active preterm labor; nonreassuring FHT pattern; Type II DM,; Chronic Hypertension; undesired fertility  POSTOPERATIVE DIAGNOSES: The same  PROCEDURE: Repeat Low Transverse Cesarean Section, Bilateral Tubal Sterilization via the [place] method  SURGEON:  Dr. Verita Schneiders  ASSISTANT:  Dr. Jacquiline Doe  ANESTHESIOLOGIST: Dr. Lyn Hollingshead  INDICATIONS: Meredith York is a 40 y.o. [id number] at [redacted]w[redacted]d here for repeat cesarean section and bilateral tubal sterilization secondary to the indications listed under preoperative diagnoses; please see preoperative note for further details.  The risks of surgery were discussed with the patient including but were not limited to: bleeding which may require transfusion or reoperation; infection which may require antibiotics; injury to bowel, bladder, ureters or other surrounding organs; injury to the fetus; need for additional procedures including hysterectomy in the event of a life-threatening hemorrhage; placental abnormalities wth subsequent pregnancies, incisional problems, thromboembolic phenomenon and other postoperative/anesthesia complications.  Patient also desires permanent sterilization.  Other reversible forms of contraception were discussed with patient; she declines all other modalities. Risks of procedure discussed with patient including but not limited to: risk of regret, permanence of method, bleeding, infection, injury to surrounding organs and need for additional procedures.  Failure risk of 1% with increased risk of ectopic gestation if pregnancy occurs was also discussed with patient.  The patient concurred with the proposed plan, giving informed written consent for the procedures.    FINDINGS:  Viable female infant in cephalic presentation.  Apgars 8 and 9.  Clear amniotic fluid.   Intact placenta, three vessel cord.  Normal uterus, fallopian tubes and ovaries bilaterally. Fallopian tubes sterilized via the Parkland method bilaterally. Moderate intraperitoneal adhesive disease involving the lower uterine segment and vesicouterine peritoneum.  ANESTHESIA: Spinal INTRAVENOUS FLUIDS: 2500 ml ESTIMATED BLOOD LOSS: 800 ml URINE OUTPUT:  300 ml SPECIMENS: Placenta sent to pathology COMPLICATIONS: None immediate  PROCEDURE IN DETAIL:  The patient preoperatively received intravenous antibiotics and had sequential compression devices applied to her lower extremities.   She was then taken to the operating room where spinal anesthesia was administered and was found to be adequate. She was then placed in a dorsal supine position with a leftward tilt, and prepped and draped in a sterile manner.  A foley catheter was placed into her bladder and attached to constant gravity.  After an adequate timeout was performed, a Pfannenstiel skin incision was made with scalpel over her preexisting scar and carried through to the underlying layer of fascia. The fascia was incised in the midline, and this incision was extended bilaterally using the Mayo scissors.  Kocher clamps were applied to the superior aspect of the fascial incision and the underlying rectus muscles were dissected off bluntly. A similar process was carried out on the inferior aspect of the fascial incision. The rectus muscles were separated in the midline bluntly and the peritoneum was entered sharply.  The vesicouterine peritoneum was noted to be high up on the lower uterine segment and the omentum was noted to be adherent to the anterior abdominal wall. Then adhesions were taken down sharply and bluntly.  Attention was turned to the lower uterine segment where a low transverse hysterotomy was made with a scalpel and extended bilaterally bluntly.  The infant was successfully delivered, the cord was clamped and cut after one minute, and the  infant was handed over to awaiting neonatology team. Uterine massage was then administered, and the  placenta delivered intact with a three-vessel cord. The uterus was then cleared of clots and debris.  The hysterotomy was closed with 0 Vicryl in a running locked fashion, and an imbricating layer was also placed with 0 Vicryl.  Figure-of-eight 0 Vicryl serosal stitches were placed to help with hemostasis.  Attention was then turned to the fallopian tubes. The left fallopian tube was then identified, and the Babcock clamp was then used to grasp the tube approximately 3 cm from the cornual region. Another clamp was placed about 3 cm distal to this initial clamp.  An opening was created sharply in an avascular portion of the intervening mesosalpinx. Two free ties of 0 plain gut were passed through the opening and the proximal end of the tube was doubly ligated. Same process was carried out for the distal end. The 2 cm tube segment was then sharply excised between the sutures, and this was sent for pathology.  This entire process was repeated in an identical fashion to the left fallopian tube, allowing for bilateral tubal sterilization. The pelvis was cleared of all clot and debris. Hemostasis was confirmed on all surfaces.  The fascia was then closed using 0 PDS in a running fashion.  The subcutaneous layer was irrigated, then reapproximated with a 2-0 plain gut running stitch, and 30 ml of 0.5% Marcaine was injected subcutaneously around the incision.  The skin was closed with a 4-0 Vicryl subcuticular stitch. The patient tolerated the procedure well. Sponge, lap, instrument and needle counts were correct x 3.  She was taken to the recovery room in stable condition.    Verita Schneiders, MD, [facility] Attending [place], [facility]

## 2016-08-03 NOTE — Anesthesia Preprocedure Evaluation (Signed)
Anesthesia Evaluation  Patient identified by MRN, date of birth, ID band Patient awake    Reviewed: Allergy & Precautions, H&P , NPO status , Patient's Chart, lab work & pertinent test results  Airway Mallampati: II  TM Distance: >3 FB Neck ROM: full    Dental no notable dental hx.    Pulmonary neg pulmonary ROS, former smoker,    Pulmonary exam normal        Cardiovascular hypertension, Pt. on home beta blockers Normal cardiovascular exam     Neuro/Psych negative psych ROS   GI/Hepatic negative GI ROS, Neg liver ROS,   Endo/Other  diabetes, Type 2, Oral Hypoglycemic AgentsMorbid obesity  Renal/GU negative Renal ROS     Musculoskeletal   Abdominal (+) + obese,   Peds  Hematology negative hematology ROS (+)   Anesthesia Other Findings   Reproductive/Obstetrics (+) Pregnancy                             Anesthesia Physical Anesthesia Plan  ASA: III  Anesthesia Plan: Spinal   Post-op Pain Management:    Induction:   Airway Management Planned:   Additional Equipment:   Intra-op Plan:   Post-operative Plan:   Informed Consent: I have reviewed the patients History and Physical, chart, labs and discussed the procedure including the risks, benefits and alternatives for the proposed anesthesia with the patient or authorized representative who has indicated his/her understanding and acceptance.     Plan Discussed with: CRNA and Surgeon  Anesthesia Plan Comments:         Anesthesia Quick Evaluation

## 2016-08-03 NOTE — Anesthesia Postprocedure Evaluation (Signed)
Anesthesia Post Note  Patient: Hazleigh Manwaring  Procedure(s) Performed: Procedure(s) (LRB): CESAREAN SECTION (N/A)  Patient location during evaluation: [facility] Anesthesia Type: Spinal Level of consciousness: oriented and awake and alert Pain management: pain level controlled Vital Signs Assessment: post-procedure vital signs reviewed and stable Respiratory status: spontaneous breathing and respiratory function stable Cardiovascular status: blood pressure returned to baseline and stable Postop Assessment: no headache and no backache Anesthetic complications: no     Last Vitals:  Vitals:   08/03/16 0420 08/03/16 0535  BP: 132/75 129/65  Pulse: 82 81  Resp: 18 18  Temp: 36.3 C 36.8 C    Last Pain:  Vitals:   08/03/16 0535  TempSrc: Oral  PainSc: 7    Pain Goal:                 Riki Sheer

## 2016-08-03 NOTE — Plan of Care (Signed)
Problem: Education: Goal: Knowledge of [facility] Education information/materials will improve Outcome: Completed/Met Date Met: 08/03/16 Patient is aware of her plan of care.  Problem: Tissue Perfusion: Goal: Risk factors for ineffective tissue perfusion will decrease Outcome: Completed/Met Date Met: 08/03/16 SCD's in place.  Problem: Fluid Volume: Goal: Ability to maintain a balanced intake and output will improve Outcome: Completed/Met Date Met: 08/03/16 Tolerates liquids well.

## 2016-08-03 NOTE — H&P (Signed)
LABOR AND DELIVERY ADMISSION HISTORY AND PHYSICAL NOTE  Meredith York is a 40 y.o. female [phone] with IUP at [redacted]w[redacted]d by 6 week ultrasound presenting for rupture of membranes and contraction..   She reports positive fetal movement. She denies bleeding and reports rupture membranes on 10 PM.  Prenatal History/Complications: Patient's pregnancy has been complicated by chronic hypertension and chronic diabetes. She's been on glyburide throughout her pregnancy and was on metformin prior to pregnancy. She asks if she can remain on glyburide post pregnancy as she does not like her metformin made her feel. Additionally she was on Norvasc prior to pregnancy but was not well controlled with her blood pressure. Her blood pressures have been fairly well controlled on labetalol twice a day at this time.  She does have a history of stat C-section with fetal death.  Past Medical History: Past Medical History:  Diagnosis Date  . Chlamydia   . Hypertension   . Migraines   . Ovarian cyst   . S/P lumbar discectomy 04/10/2015  . Trichomonas   . Type 2 diabetes mellitus (HCC)    glyburide    Past Surgical History: Past Surgical History:  Procedure Laterality Date  . BACK SURGERY    . CESAREAN SECTION  12/27/2011   Procedure: CESAREAN SECTION;  Surgeon: Shelly Bombard, MD;  Location: [facility];  Service: Gynecology;  Laterality: N/A;  Primary cesarean section with delivery of baby girl at 70. Apgars 3/3/6  . CHOLECYSTECTOMY  02/2009  . DILATION AND CURETTAGE OF UTERUS N/A 06/25/2014   Procedure: DILATATION AND CURETTAGE;  Surgeon: Donnamae Jude, MD;  Location: [facility];  Service: Gynecology;  Laterality: N/A;  . LUMBAR LAMINECTOMY/DECOMPRESSION MICRODISCECTOMY Left 04/10/2015   Procedure: Left L5-S1 Microdiscectomy;  Surgeon: Marybelle Killings, MD;  Location: [facility];  Service: Orthopedics;  Laterality: Left;    Obstetrical History: OB History    Gravida Para Term Preterm AB Living   6 3 3   2 2    SAB TAB Ectopic  Multiple Live Births   1 1     3       Social History: Social History   Social History  . Marital status: Single    Spouse name: N/A  . Number of children: N/A  . Years of education: N/A   Social History Main Topics  . Smoking status: Former Smoker    Packs/day: 0.25    Years: 16.00    Types: Cigarettes    Quit date: 12/22/2015  . Smokeless tobacco: Never Used  . Alcohol use No  . Drug use: No  . Sexual activity: Yes    Birth control/ protection: None   Other Topics Concern  . None   Social History Narrative  . None    Family History: Family History  Problem Relation Age of Onset  . Hypertension Maternal Aunt   . Hypertension Paternal Aunt   . Club foot Daughter   . Hirschsprung's disease Daughter   . Learning disabilities Daughter   . Polydactyly Daughter   . Anesthesia problems Neg Hx   . Hypotension Neg Hx   . Malignant hyperthermia Neg Hx   . Pseudochol deficiency Neg Hx     Allergies: Allergies  Allergen Reactions  . Amoxicillin Hives and Itching    Has patient had a PCN reaction causing immediate rash, facial/tongue/throat swelling, SOB or lightheadedness with hypotension: No Has patient had a PCN reaction causing severe rash involving mucus membranes or skin necrosis: No Has patient had a PCN  reaction that required hospitalization No Has patient had a PCN reaction occurring within the last 10 years: No If all of the above answers are "NO", then may proceed with Cephalosporin use.     Prescriptions Prior to Admission  Medication Sig Dispense Refill Last Dose  . ACCU-CHEK SMARTVIEW test strip U UTD QID  0 08/02/2016  . acetaminophen (TYLENOL) 500 MG tablet Take 1,000 mg by mouth every 6 (six) hours as needed for mild pain or headache.    08/02/2016  . aspirin EC 81 MG tablet Take 81 mg by mouth daily.   08/02/2016  . glyBURIDE (DIABETA) 1.25 MG tablet Take 1 tablet (1.25 mg total) by mouth See admin instructions. 1 tab in AM and 3 tabs at bedtime 120  tablet 1 08/02/2016  . labetalol (NORMODYNE) 200 MG tablet Take 1 tablet by mouth 3 (three) times daily.  2 08/02/2016  . ondansetron (ZOFRAN ODT) 8 MG disintegrating tablet Take 1 tablet (8 mg total) by mouth every 8 (eight) hours as needed for nausea or vomiting. 20 tablet 0 08/02/2016  . Prenatal Vit-Fe Fumarate-FA (PRENATAL MULTIVITAMIN) TABS tablet Take 1 tablet by mouth daily.    08/02/2016     Review of Systems   All systems reviewed and negative except as stated in HPI  Blood pressure 149/97, pulse 94, temperature 98.5 F (36.9 C), temperature source Oral, resp. rate 18, last menstrual period 12/16/2015, SpO2 99 %, unknown if currently breastfeeding. General appearance: alert, cooperative and appears stated age Lungs: No respiratory distress, no audible wheezing Heart: regular rate to distal pulses intact Abdomen: soft, non-tender; gravid Extremities: No calf swelling or tenderness Presentation: cephalic by nurses exam Fetal monitoring: Category 2 tracing with minimal variability Uterine activity: Contractions every 3 minutes Dilation: 3 Effacement (%): 50 Station: -3 Exam by:: Erasmo Score RNC   Prenatal labs: ABO, Rh: O/Positive/-- (02/16 0000) Antibody: Negative (02/16 0000) Rubella: Immune RPR: Nonreactive (08/09 0000)  HBsAg: Negative (02/16 0000)  HIV: Non-reactive (02/16 0000)  GBS: Negative (02/16 0000)  Anatomy US: Normal  Prenatal Transfer Tool  Maternal Diabetes: Yes:  Diabetes Type:  Pre-pregnancy Genetic Screening: Declined Maternal Ultrasounds/Referrals: Normal Fetal Ultrasounds or other Referrals:  Other:  Regular BPP's Maternal Substance Abuse:  No Significant Maternal Medications:  Meds include: Other:  glyburide and labetalol Significant Maternal Lab Results: Lab values include: Group B Strep negative  Results for orders placed or performed during the hospital encounter of 08/02/16 (from the past 24 hour(s))  Fern Test   Collection Time:  08/02/16 10:47 PM  Result Value Ref Range   POCT Fern Test Positive = ruptured amniotic membanes   CBC   Collection Time: 08/02/16 10:50 PM  Result Value Ref Range   WBC 9.7 4.0 - 10.5 K/uL   RBC 3.66 (L) 3.87 - 5.11 MIL/uL   Hemoglobin 9.9 (L) 12.0 - 15.0 g/dL   HCT 29.9 (L) 36.0 - 46.0 %   MCV 81.7 78.0 - 100.0 fL   MCH 27.0 26.0 - 34.0 pg   MCHC 33.1 30.0 - 36.0 g/dL   RDW 15.2 11.5 - 15.5 %   Platelets 324 150 - 400 K/uL    Patient Active Problem List   Diagnosis Date Noted  . Gestational diabetes mellitus, class B1 07/29/2016  . Supervision of high-risk pregnancy Aug 02, 2016  . History of cesarean delivery affecting pregnancy 02-Aug-2016  . Neonatal death 08-02-2016  . Advanced maternal age in multigravida 03/12/2016  . DM (diabetes mellitus), type 2 ([place]) 06/25/2014  .  Hypertension 07/05/2011    Assessment: Mirajane Seepersad is a 40 y.o. [id number] at [redacted]w[redacted]d here for rupture of membranes and early labor with previous C-section  #Labor: We will stop labor at this time with terbutaline and plan for urgent C-section #Pain: Spinal #FWB: Regarding 2 tracing with minimal variability. Patient has been bolused and has been repositioned #ID:  GBS  Negative #MOF: Breast and bottle #MOC: Bilateral tubal ligation #Circ:  Not applicable  Postpartum we'll plan to place patient back on glyburide as she did not like the way metformin made her feel. Additionally we will start patient on enalapril 10 mg. She was non-Ace before as she had intact fertility but now that she can have a tubal ligation we do not have doing about treaded genic side effects.  Jacquiline Doe 08/03/2016, 12:15 AM

## 2016-08-03 NOTE — Transfer of Care (Signed)
Immediate Anesthesia Transfer of Care Note  Patient: Meredith York  Procedure(s) Performed: Procedure(s): CESAREAN SECTION (N/A)  Patient Location: PACU  Anesthesia Type:Spinal  Level of Consciousness: awake  Airway & Oxygen Therapy: Patient Spontanous Breathing  Post-op Assessment: Report given to RN and Post -op Vital signs reviewed and stable  Post vital signs: stable  Last Vitals:  Vitals:   08/02/16 2240  BP: 149/97  Pulse: 94  Resp: 18  Temp: 36.9 C    Last Pain:  Vitals:   08/02/16 2240  TempSrc: Oral         Complications: No apparent anesthesia complications

## 2016-08-04 ENCOUNTER — Encounter: Payer: Self-pay | Admitting: Obstetrics & Gynecology

## 2016-08-04 DIAGNOSIS — O411290 Chorioamnionitis, unspecified trimester, not applicable or unspecified: Secondary | ICD-10-CM | POA: Insufficient documentation

## 2016-08-04 LAB — CBC
HEMATOCRIT: 21 % — AB (ref 36.0–46.0)
HEMOGLOBIN: 7.1 g/dL — AB (ref 12.0–15.0)
MCH: 26.9 pg (ref 26.0–34.0)
MCHC: 33.8 g/dL (ref 30.0–36.0)
MCV: 79.5 fL (ref 78.0–100.0)
Platelets: 289 10*3/uL (ref 150–400)
RBC: 2.64 MIL/uL — ABNORMAL LOW (ref 3.87–5.11)
RDW: 15.7 % — ABNORMAL HIGH (ref 11.5–15.5)
WBC: 16.1 10*3/uL — ABNORMAL HIGH (ref 4.0–10.5)

## 2016-08-04 LAB — GLUCOSE, CAPILLARY
GLUCOSE-CAPILLARY: 169 mg/dL — AB (ref 65–99)
GLUCOSE-CAPILLARY: 121 mg/dL — AB (ref 65–99)
GLUCOSE-CAPILLARY: 66 mg/dL (ref 65–99)
GLUCOSE-CAPILLARY: 78 mg/dL (ref 65–99)
Glucose-Capillary: 157 mg/dL — ABNORMAL HIGH (ref 65–99)
Glucose-Capillary: 91 mg/dL (ref 65–99)
Glucose-Capillary: 86 mg/dL (ref 65–99)

## 2016-08-04 NOTE — Lactation Note (Signed)
This note was copied from a baby's chart. Lactation Consultation Note  Patient Name: Meredith York [id number] Date: 08/04/2016 Reason for consult: Follow-up assessment;NICU baby  NICU baby 50 hours old. Mom reports that she has been pumping every 3 hours but isn't getting anything. Mom reports that she had breast milk with each of her children. Enc mom to pump every 2-3 hours for a total of 8 times/24 hours followed by hand expression. Enc mom to call for assistance as needed. Mom states that [facility] has already called and she has an appointment for day of D/C--probably 08-06-16. Maternal Data    Feeding Feeding Type: Formula Nipple Type: Slow - flow Length of feed: 30 min  LATCH Score/Interventions                      Lactation Tools Discussed/Used     Consult Status Consult Status: Follow-up Date: 08/05/16 Follow-up type: In-patient    [name] 08/04/2016, 6:11 PM

## 2016-08-04 NOTE — Progress Notes (Signed)
Pt was a no show for this visit

## 2016-08-04 NOTE — Progress Notes (Signed)
Subjective: Postpartum Day 1: Cesarean Delivery Patient reports incisional pain, tolerating PO, + flatus and no problems voiding.    Objective: Vital signs in last 24 hours: Temp:  [97.9 F (36.6 C)-98.6 F (37 C)] 97.9 F (36.6 C) (08/15 1323) Pulse Rate:  [82-106] 100 (08/15 1323) Resp:  [16-18] 18 (08/15 1323) BP: (103-145)/(55-75) 127/64 (08/15 1323) SpO2:  [98 %-100 %] 99 % (08/15 1323) Weight:  [108.9 kg (240 lb)] 108.9 kg (240 lb) (08/14 2021)  Physical Exam:  General: alert, cooperative and no distress Heart: RRR Lungs: CTAB Lochia: appropriate Uterine Fundus: firm Incision: healing well, no significant drainage, no dehiscence, no significant erythema, honeycomb dsg c/d/i DVT Evaluation: No evidence of DVT seen on physical exam. Negative Homan's sign. No cords or calf tenderness. No significant calf/ankle edema.   Recent Labs  08/02/16 2250 08/04/16 0621  HGB 9.9* 7.1*  HCT 29.9* 21.0*  FBS: 157  Assessment/Plan: Status post Cesarean section & BTL. Doing well postoperatively.  Chronic HTN-stable on Enalapril T2DM-stable on Glyburide IDA and acute ABL anemia-asymptomatic, Fe started Consider discharge home tomorrow  Micheline Rough 08/04/2016, 2:46 PM

## 2016-08-04 NOTE — Progress Notes (Signed)
CLINICAL SOCIAL WORK MATERNAL/CHILD NOTE  Patient Details  Name: Meredith York MRN: [id number] Date of Birth: 08/03/2015  Date:  08/04/2016  Clinical Social Worker Initiating Note:  [place], [place]            Date/ Time Initiated:  08/04/16/1433                        Child's Name:  Meredith York   Legal Guardian:  Mother   Need for Interpreter:  None   Date of Referral:  08/04/16     Reason for Referral:  Other (Comment) ([facility] Admission, loss of a child (18 days old in 2013))   Referral Source:  RN   Address:     Phone number:      Household Members: Minor Children, Adult Children, Spouse   Natural Supports (not living in the home): Community, Extended Family, Friends, Immediate Family   Professional Supports:None   Employment:Part-time   Type of Work: Medical sales representative   Education:  Database administrator Resources:Medicaid   Other Resources: [employer], Physicist, medical    Cultural/Religious Considerations Which May Impact Care: none reported  Strengths: Ability to meet basic needs , Compliance with medical plan , Home prepared for child , Pediatrician chosen    Risk Factors/Current Problems: Adjustment to Illness    Cognitive State: Alert , Linear Thinking , Insightful , Goal Oriented    Mood/Affect: Calm , Interested , Happy    CSW Assessment:LCSW met with MOB and FOB at the bedside due to NICU admission. Chart review complete and LCSW reviewed note from [place] in 2013.  MOB also has history of loss of child (after 3 days) in 2013.  History from SW note in 2013:MOB informed SW that her 39 year old receives SSI. She states that she has bilateral club feet, developmental delays and had Hirschsprung's Disease. SW assisted MOB in completing SSI application and will submit paperwork once birth certificate has been completed and a copy has been obtained. SW inquired about which pediatrician MOB takes her daughters to  and she informed SW that her 39 year old goes to Dr. Koleen Nimrod at [facility] and her 39 year old goes to Dr. Joneen Caraway at [facility]. She plans to take this baby to Dr. Joneen Caraway also. SW is not sure that MOB fully understands the severity of the situation with baby's medical condition, but suspects she may be handling it so well since she has had experience with a child with special needs.  MOB reports today oldest child is 27 years old, has developmental disabilities, but was able to complete high school and has a job. She reports she does not know if child will be able to live on her own, but she is able to function independently. LCSW did not assess loss of child as this Probation officer felt it was not appropriate at this time to bring it up as MOB and FOB were bonding with new born.  Will discuss with MOB at a later time.  MOB reports feeling overwhelmed, but reports having a good support system. Reports she is happy baby is here and healthy and reports she is feeling better each day. Emotionally MOB reports she is drained and working to care for self, get home prepared for daughter.  She reports no depression or anxiety at this time.  LCSW is aware MOB was in NICU in past and briefly explained role and SW intervention if needed. At this  time, MOB and FOB deny needs. Report they have reliable transportation and will be here frequently.  NO other needs or stressors identified at this time. LCSW will be available throughout NICU admission. MOB and FOB aware of how to contact CSW if needed.   CSW Plan/Description: Engineer, mining , Psychosocial Support and Ongoing Assessment of Needs    Marshell Garfinkel 08/04/2016, 2:35 PM

## 2016-08-05 LAB — GLUCOSE, CAPILLARY
GLUCOSE-CAPILLARY: 106 mg/dL — AB (ref 65–99)
GLUCOSE-CAPILLARY: 51 mg/dL — AB (ref 65–99)
GLUCOSE-CAPILLARY: 77 mg/dL (ref 65–99)
Glucose-Capillary: 66 mg/dL (ref 65–99)
Glucose-Capillary: 97 mg/dL (ref 65–99)

## 2016-08-05 NOTE — Progress Notes (Signed)
MD on call notified of patient's continued low CBGs. MD stated that they would review the patients medication.

## 2016-08-05 NOTE — Progress Notes (Signed)
Patient ID: Meredith York, female   DOB: 03-Jan-1976, 40 y.o.   MRN: [id number]  POSTPARTUM PROGRESS NOTE  Post Partum Day / Post Op Day # 2 Subjective:  Natalee Taormina is a 40 y.o. [id number] [redacted]w[redacted]d s/p RLTCS with BTL.  No acute events overnight.  Pt denies problems with ambulating, voiding or po intake.  She denies nausea or vomiting.  Pain is well controlled.  She has had flatus. She has not had bowel movement.  Lochia Small. Per patient and RN, baby is still in NICU.   Objective: Blood pressure (!) 113/57, pulse 87, temperature 97.8 F (36.6 C), temperature source Oral, resp. rate 14, height 5' 2.5" (1.588 m), weight 240 lb (108.9 kg), last menstrual period 12/16/2015, SpO2 100 %, unknown if currently breastfeeding.  Physical Exam:  General: alert, cooperative and no distress Lochia:normal flow Chest: CTAB Heart: RRR no m/r/g Abdomen: +BS, soft, nontender,  Uterine Fundus: firm, below umbilicus Incision: honeycomb dressing in place, minimal old serous-anginous drainage, clean/dry/intact. DVT Evaluation: No calf swelling or tenderness, negative Homan's sign. Extremities: Trace edema   Recent Labs  08/02/16 2250 08/04/16 0621  HGB 9.9* 7.1*  HCT 29.9* 21.0*    Assessment/Plan:  ASSESSMENT: Adalicia Boisselle is a 40 y.o. [id number] [redacted]w[redacted]d s/p RLTCS with BTL. Chronic HTN - controlled NIDDM, type 2 - controlled Acute blood loss anemia - asymptomatic  Plan for discharge tomorrow and Contraception BTL performed.  Fe supplements Continue with glyburide 2.5mg  BID, will send home on regimen Enalapril, will send home with Rx   LOS: 2 days   Katherine Basset, DO 08/05/2016, 10:29 AM

## 2016-08-06 ENCOUNTER — Other Ambulatory Visit: Payer: Self-pay | Admitting: Advanced Practice Midwife

## 2016-08-06 ENCOUNTER — Ambulatory Visit (HOSPITAL_COMMUNITY): Payer: Medicaid Other

## 2016-08-06 LAB — GLUCOSE, CAPILLARY
GLUCOSE-CAPILLARY: 102 mg/dL — AB (ref 65–99)
Glucose-Capillary: 101 mg/dL — ABNORMAL HIGH (ref 65–99)

## 2016-08-06 MED ORDER — ENALAPRIL MALEATE 10 MG PO TABS: 10.0000 mg | ORAL_TABLET | Freq: Every day | ORAL | 0 refills | Status: DC

## 2016-08-06 MED ORDER — OXYCODONE-ACETAMINOPHEN 5-325 MG PO TABS: 1.0000 | ORAL_TABLET | ORAL | 0 refills | Status: DC | PRN

## 2016-08-06 MED ORDER — FERROUS SULFATE 325 (65 FE) MG PO TABS: 325.0000 mg | ORAL_TABLET | Freq: Three times a day (TID) | ORAL | 1 refills | Status: DC

## 2016-08-06 MED ORDER — IBUPROFEN 600 MG PO TABS: 600.0000 mg | ORAL_TABLET | Freq: Four times a day (QID) | ORAL | 0 refills | Status: DC

## 2016-08-06 NOTE — Discharge Instructions (Signed)
Cesarean Delivery, Care After  Refer to this sheet in the next few weeks. These instructions provide you with information on caring for yourself after your procedure. Your health care provider may also give you specific instructions. Your treatment has been planned according to current medical practices, but problems sometimes occur. Call your health care provider if you have any problems or questions after you go home.  HOME CARE INSTRUCTIONS   Only take over-the-counter or prescription medications as directed by your health care provider.   Do not drink alcohol, especially if you are breastfeeding or taking medication to relieve pain.   Do not chew or smoke tobacco.   Continue to use good perineal care. Good perineal care includes:    Wiping your perineum from front to back.    Keeping your perineum clean.   Check your surgical cut (incision) daily for increased redness, drainage, swelling, or separation of skin.   Clean your incision gently with soap and water every day, and then pat it dry. If your health care provider says it is okay, leave the incision uncovered. Use a bandage (dressing) if the incision is draining fluid or appears irritated. If the adhesive strips across the incision do not fall off within 7 days, carefully peel them off.   Hug a pillow when coughing or sneezing until your incision is healed. This helps to relieve pain.   Do not use tampons or douche until your health care provider says it is okay.   Shower, wash your hair, and take tub baths as directed by your health care provider.   Wear a well-fitting bra that provides breast support.   Limit wearing support panties or control-top hose.   Drink enough fluids to keep your urine clear or pale yellow.   Eat high-fiber foods such as whole grain cereals and breads, brown rice, beans, and fresh fruits and vegetables every day. These foods may help prevent or relieve constipation.   Resume activities such as climbing stairs,  driving, lifting, exercising, or traveling as directed by your health care provider.   Talk to your health care provider about resuming sexual activities. This is dependent upon your risk of infection, your rate of healing, and your comfort and desire to resume sexual activity.   Try to have someone help you with your household activities and your newborn for at least a few days after you leave the hospital.   Rest as much as possible. Try to rest or take a nap when your newborn is sleeping.   Increase your activities gradually.   Keep all of your scheduled postpartum appointments. It is very important to keep your scheduled follow-up appointments. At these appointments, your health care provider will be checking to make sure that you are healing physically and emotionally.  SEEK MEDICAL CARE IF:    You are passing large clots from your vagina. Save any clots to show your health care provider.   You have a foul smelling discharge from your vagina.   You have trouble urinating.   You are urinating frequently.   You have pain when you urinate.   You have a change in your bowel movements.   You have increasing redness, pain, or swelling near your incision.   You have pus draining from your incision.   Your incision is separating.   You have painful, hard, or reddened breasts.   You have a severe headache.   You have blurred vision or see spots.   You feel sad   or depressed.   You have thoughts of hurting yourself or your newborn.   You have questions about your care, the care of your newborn, or medications.   You are dizzy or light-headed.   You have a rash.   You have pain, redness, or swelling at the site of the removed intravenous access (IV) tube.   You have nausea or vomiting.   You stopped breastfeeding and have not had a menstrual period within 12 weeks of stopping.   You are not breastfeeding and have not had a menstrual period within 12 weeks of delivery.   You have a fever.  SEEK  IMMEDIATE MEDICAL CARE IF:   You have persistent pain.   You have chest pain.   You have shortness of breath.   You faint.   You have leg pain.   You have stomach pain.   Your vaginal bleeding saturates 2 or more sanitary pads in 1 hour.  MAKE SURE YOU:    Understand these instructions.   Will watch your condition.   Will get help right away if you are not doing well or get worse.     This information is not intended to replace advice given to you by your health care provider. Make sure you discuss any questions you have with your health care provider.     Document Released: 08/29/2002 Document Revised: 12/28/2014 Document Reviewed: 08/03/2012  Elsevier Interactive Patient Education 2016 [employer]

## 2016-08-06 NOTE — Progress Notes (Signed)
Pt is discharged in the care of friend. Downstairs per Inambulatory. Understood  all discharged instructions well. Infant to remain in NICU Abdominal dressing clean and dry.

## 2016-08-06 NOTE — Discharge Summary (Signed)
OB Discharge Summary     Patient Name: Meredith York DOB: [birth year] MRN: [id number]  Date of admission: 08/02/2016 Delivering MD: Verita Schneiders A   Date of discharge: 08/06/2016  Admitting diagnosis: 32 WKS, WATER BROKE Intrauterine pregnancy: [redacted]w[redacted]d     Secondary diagnosis:  Principal Problem:   S/P cesarean section Active Problems:   Hypertension   DM (diabetes mellitus), type 2 ([place])   Gestational diabetes mellitus, class B1   Active labor  Additional problems: None     Discharge diagnosis: Preterm Pregnancy Delivered, CHTN, Type 2 DM and Anemia                                                                                                Post partum procedures:None  Augmentation: None  Complications: None  Hospital course:  Onset of Labor With Unplanned C/S  40 y.o. yo [id number] at [redacted]w[redacted]d was admitted in Latent Labor w/ SROM on 08/02/2016. Patient had a labor course significant for Nothing. Membrane Rupture Time/Date: 10:00 PM ,08/02/2016   The patient went for cesarean section due to Elective Repeat, and delivered a Viable infant,08/03/2016  Details of operation can be found in separate operative note. Patient had an uncomplicated postpartum course.  She is ambulating,tolerating a regular diet, passing flatus, and urinating well.  Patient is discharged home in stable condition 08/06/16.   Physical exam Vitals:   08/05/16 0622 08/05/16 1259 08/05/16 1848 08/06/16 0030  BP: (!) 113/57 131/69 131/76 (!) 142/81  Pulse: 87 93 95 89  Resp: 14 18 18 18   Temp: 97.8 F (36.6 C) 99.1 F (37.3 C) 98.5 F (36.9 C) 98.4 F (36.9 C)  TempSrc: Oral Oral Oral Oral  SpO2: 100% 100% 100% 93%  Weight:      Height:       General: alert, cooperative and no distress Lochia: appropriate Uterine Fundus: firm Incision: Dressing is clean, dry, and intact DVT Evaluation: Negative Homan's sign. Labs: Lab Results  Component Value Date   WBC 16.1 (H) 08/04/2016   HGB 7.1 (L)  08/04/2016   HCT 21.0 (L) 08/04/2016   MCV 79.5 08/04/2016   PLT 289 08/04/2016   CMP Latest Ref Rng & Units 04/23/2016  Glucose 65 - 99 mg/dL 101(H)  BUN 6 - 20 mg/dL 8  Creatinine 0.44 - 1.00 mg/dL 0.49  Sodium 135 - 145 mmol/L 134(L)  Potassium 3.5 - 5.1 mmol/L 3.1(L)  Chloride 101 - 111 mmol/L 106  CO2 22 - 32 mmol/L 20(L)  Calcium 8.9 - 10.3 mg/dL 8.9  Total Protein 6.5 - 8.1 g/dL 7.3  Total Bilirubin 0.3 - 1.2 mg/dL 0.3  Alkaline Phos 38 - 126 U/L 80  AST 15 - 41 U/L 33  ALT 14 - 54 U/L 46    Discharge instruction: per After Visit Summary and "Baby and Me Booklet".  After visit meds:    Medication List    STOP taking these medications   aspirin EC 81 MG tablet   glyBURIDE 1.25 MG tablet Commonly known as:  DIABETA   labetalol 200 MG tablet Commonly known as:  NORMODYNE  TAKE these medications   ACCU-CHEK SMARTVIEW test strip Generic drug:  glucose blood U UTD QID   acetaminophen 500 MG tablet Commonly known as:  TYLENOL Take 1,000 mg by mouth every 6 (six) hours as needed for mild pain or headache.   enalapril 10 MG tablet Commonly known as:  VASOTEC Take 1 tablet (10 mg total) by mouth daily.   ferrous sulfate 325 (65 FE) MG tablet Take 1 tablet (325 mg total) by mouth 3 (three) times daily with meals.   ibuprofen 600 MG tablet Commonly known as:  ADVIL,MOTRIN Take 1 tablet (600 mg total) by mouth every 6 (six) hours.   oxyCODONE-acetaminophen 5-325 MG tablet Commonly known as:  PERCOCET/ROXICET Take 1-2 tablets by mouth every 4 (four) hours as needed (pain scale > 7).   prenatal multivitamin Tabs tablet Take 1 tablet by mouth daily.       Diet: carb modified diet  Activity: Advance as tolerated. Pelvic rest for 6 weeks.   Outpatient follow up:6 weeks Follow-up [facility] for [facility] Follow up in 6 week(s).   Specialty:  Obstetrics and Gynecology Why:  Postpartum visit Contact information: [place]       [facility] .   Why:  as needed in emergencies Contact information: [address] [id number] [place] [phone]         F/U w/ PCP RE: Restarted DM meds. Hold for now due to hypoglycemia.   Postpartum contraception: Tubal Ligation  Newborn Data: Live born female  Birth Weight: 7 lb 12.5 oz (3530 g) APGAR: 8, 8  Baby Feeding: Bottle Disposition:NICU   08/06/2016 Manya Silvas, CNM

## 2016-08-06 NOTE — Lactation Note (Signed)
This note was copied from a baby's chart. Lactation Consultation Note  Patient Name: Meredith York [id number] Date: 08/06/2016 Reason for consult: Follow-up assessment  With this mom of a NICU baby, now 63 hours old, and 37 weeks CGA, and weighs 7 lbs 4 oz. Mom is pumping and getting up to 20-30 ml's today. Mom to loan DEP, and call [facility] for appt to get DEP.  Mom advised to pump and follow with hand expression at least 8 times a day. Mom wants to latch baby in NICU, and I advised mom to call for lactation assistance as needed.  I advised mom to make an o/p lactation appointment at baby's discharge.    Maternal Data    Feeding Feeding Type: Formula Nipple Type: Slow - flow Length of feed: 20 min  LATCH Score/Interventions                      Lactation Tools Discussed/Used     Consult Status Consult Status: PRN Follow-up type: In-patient (NICU)    [name] 08/06/2016, 1:30 PM

## 2016-08-10 ENCOUNTER — Ambulatory Visit (HOSPITAL_COMMUNITY): Payer: Medicaid Other

## 2016-08-13 ENCOUNTER — Ambulatory Visit (HOSPITAL_COMMUNITY): Payer: Medicaid Other

## 2016-08-17 ENCOUNTER — Ambulatory Visit (HOSPITAL_COMMUNITY): Payer: Medicaid Other

## 2016-08-19 ENCOUNTER — Telehealth: Payer: Self-pay | Admitting: *Deleted

## 2016-08-19 NOTE — Telephone Encounter (Signed)
[facility] nurse called to report patients Bp left arm 162/94 and repeat bp right arm 154/92 and patient had taken her Vasotec 10 mg daily dose.Dr Elly Modena informed and Norvasc 10 mg po daily added to daily medication.Patient notified needs repeat bp in office on 08/25/16.Explained to patient to go to [facility] if has H/A,blurred vision,RUQ pain,or SOB.Norvasc called to [employer] and [place]. [phone].

## 2016-08-22 ENCOUNTER — Inpatient Hospital Stay (HOSPITAL_COMMUNITY)
Admission: RE | Admit: 2016-08-22 | Payer: Medicaid Other | Source: Ambulatory Visit | Admitting: Obstetrics & Gynecology

## 2016-08-22 ENCOUNTER — Encounter (HOSPITAL_COMMUNITY): Admission: RE | Payer: Self-pay | Source: Ambulatory Visit

## 2016-08-22 SURGERY — Surgical Case
Anesthesia: Regional | Site: Abdomen | Laterality: Bilateral

## 2016-08-25 ENCOUNTER — Ambulatory Visit: Payer: Medicaid Other | Admitting: *Deleted

## 2016-08-25 VITALS — BP 132/83 | HR 92

## 2016-08-25 DIAGNOSIS — I10 Essential (primary) hypertension: Secondary | ICD-10-CM

## 2016-09-14 ENCOUNTER — Ambulatory Visit (INDEPENDENT_AMBULATORY_CARE_PROVIDER_SITE_OTHER): Payer: Medicaid Other | Admitting: Obstetrics and Gynecology

## 2016-09-14 MED ORDER — METFORMIN HCL 500 MG PO TABS: 500.0000 mg | ORAL_TABLET | Freq: Two times a day (BID) | ORAL | 6 refills | Status: DC

## 2016-09-14 NOTE — Progress Notes (Signed)
Subjective:     Meredith York is a 40 y.o. female who presents for a postpartum visit. She is 5 weeks postpartum following a low cervical transverse Cesarean section. I have fully reviewed the prenatal and intrapartum course. The delivery was at 36.4 gestational weeks (PROM in labor). Outcome: repeat cesarean section, low transverse incision. Anesthesia: spinal. Postpartum course has been uncomplicated. Baby's course has been uncomplicated. Baby is feeding by bottle - Similac Sensitive RS. Bleeding no bleeding. Bowel function is normal. Bladder function is normal. Patient is not sexually active. Contraception method is tubal ligation. Postpartum depression screening: negative. She receives ample assistance from her husband, mother and older daughters.     Review of Systems Pertinent items are noted in HPI.   Objective:    There were no vitals taken for this visit.  General:  alert, cooperative and no distress   Breasts:  inspection negative, no nipple discharge or bleeding, no masses or nodularity palpable  Lungs: clear to auscultation bilaterally  Heart:  regular rate and rhythm  Abdomen: soft, non-tender; bowel sounds normal; no masses,  no organomegaly and incision: healing well without erythema, induration or drainage   Vulva:  normal  Vagina: normal vagina, no discharge, exudate, lesion, or erythema  Cervix:  no lesions  Corpus: normal size, contour, position, consistency, mobility, non-tender  Adnexa:  no mass, fullness, tenderness  Rectal Exam: Not performed.        Assessment:     Normal postpartum exam. Pap smear not done at today's visit.   Plan:    1. Contraception: tubal ligation 2. Patient is medically cleared to resume all activities of daily living. Advised patient to continue antihypertensive medications. She was on Metformin pre-pregnancy. Rx metfominin 500 mg BID provided. Advised patient to actively lose weight through diet and exercise. Follow up with PCP for  medication management 3. Follow up in: 6 months for annual exam or prn or as needed.

## 2016-10-09 ENCOUNTER — Encounter: Payer: Self-pay | Admitting: *Deleted

## 2016-11-06 ENCOUNTER — Encounter: Payer: Self-pay | Admitting: *Deleted

## 2017-02-11 ENCOUNTER — Encounter (HOSPITAL_COMMUNITY): Payer: Self-pay

## 2017-05-06 ENCOUNTER — Emergency Department (HOSPITAL_COMMUNITY)
Admission: EM | Admit: 2017-05-06 | Discharge: 2017-05-06 | Disposition: A | Discharge status: Home / Self Care | Payer: Medicaid Other | Attending: Emergency Medicine | Admitting: Emergency Medicine

## 2017-05-06 ENCOUNTER — Emergency Department (HOSPITAL_COMMUNITY): Payer: Medicaid Other

## 2017-05-06 ENCOUNTER — Encounter (HOSPITAL_COMMUNITY): Payer: Self-pay | Admitting: Emergency Medicine

## 2017-05-06 DIAGNOSIS — G5601 Carpal tunnel syndrome, right upper limb: Secondary | ICD-10-CM | POA: Diagnosis not present

## 2017-05-06 DIAGNOSIS — I10 Essential (primary) hypertension: Secondary | ICD-10-CM | POA: Diagnosis not present

## 2017-05-06 DIAGNOSIS — Z79899 Other long term (current) drug therapy: Secondary | ICD-10-CM | POA: Diagnosis not present

## 2017-05-06 DIAGNOSIS — Z87891 Personal history of nicotine dependence: Secondary | ICD-10-CM | POA: Insufficient documentation

## 2017-05-06 DIAGNOSIS — Z7984 Long term (current) use of oral hypoglycemic drugs: Secondary | ICD-10-CM | POA: Insufficient documentation

## 2017-05-06 DIAGNOSIS — E119 Type 2 diabetes mellitus without complications: Secondary | ICD-10-CM | POA: Diagnosis not present

## 2017-05-06 DIAGNOSIS — M25531 Pain in right wrist: Secondary | ICD-10-CM | POA: Diagnosis present

## 2017-05-06 MED ORDER — MELOXICAM 15 MG PO TABS: 15.0000 mg | ORAL_TABLET | Freq: Every day | ORAL | 0 refills | Status: DC

## 2017-05-06 MED ORDER — MELOXICAM 15 MG PO TABS
15.0000 mg | ORAL_TABLET | Freq: Once | ORAL | Status: AC
  Administered 2017-05-06: 15 mg via ORAL
  Filled 2017-05-06: qty 1

## 2017-05-06 NOTE — ED Notes (Signed)
Bed: WTR8 Expected date:  Expected time:  Means of arrival:  Comments: 

## 2017-05-06 NOTE — ED Provider Notes (Signed)
[facility] DEPT Provider Note    By signing my name below, I, Bea Graff, attest that this documentation has been prepared under the direction and in the presence of [address] [place], [employer]. Electronically Signed: Bea Graff, ED Scribe. 05/06/17. 10:42 AM.    History   Chief Complaint Chief Complaint  Patient presents with  . Wrist Pain  . Hand Pain   The history is provided by the patient and medical records. No language interpreter was used.  Wrist Pain  This is a recurrent problem. The current episode started more than 1 week ago. The problem occurs constantly. The problem has been gradually worsening. Pertinent negatives include no chest pain, no abdominal pain and no shortness of breath. Exacerbated by: repetitive wrist movements. Nothing relieves the symptoms. She has tried acetaminophen (and advil and wrist splint) for the symptoms. The treatment provided no relief.    Meredith York is an obese 41 y.o. female with PMHx of T2DM, HTN, and carpal tunnel syndrome, who presents to the Emergency Department complaining of carpal tunnel flare up of R wrist. She describes the pain as 8/10 constant burning and aching right wrist pain that radiates up the entire RUE, worse with wrist movements particularly rotation, and unrelieved with tylenol, advil, and using her wrist splint. She is only using the hard part of the splint at night, not during the day. She states this is the same as her carpal tunnel pain, but it began worsening about 2.5 weeks ago. 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 She reports associated numbness/tingling of all fingers and right hand, and occasional swelling. She denies redness, warmth, fever, chills, CP, SOB, abdominal pain, nausea, vomiting, constipation, diarrhea, dysuria, hematuria, focal weakness, or any other complaints at this time.    Past  Medical History:  Diagnosis Date  . Chlamydia   . Hypertension   . Migraines   . Ovarian cyst   . S/P lumbar discectomy 04/10/2015  . Trichomonas   . Type 2 diabetes mellitus ([place])    glyburide    Patient Active Problem List   Diagnosis Date Noted  . Acute chorioamnionitis on placental pathology s/p cesarean section on 08/04/15 08/04/2016  . Active labor 08/03/2016  . Gestational diabetes mellitus, class B1 07/29/2016  . Supervision of high-risk pregnancy 08/10/2016  . S/P cesarean section 08/10/2016  . Neonatal death 08/10/16  . Advanced maternal age in multigravida 03/12/2016  . DM (diabetes mellitus), type 2 ([place]) 06/25/2014  . Hypertension 07/05/2011    Past Surgical History:  Procedure Laterality Date  . BACK SURGERY    . CESAREAN SECTION  12/27/2011   Procedure: CESAREAN SECTION;  Surgeon: Shelly Bombard, MD;  Location: [facility];  Service: Gynecology;  Laterality: N/A;  Primary cesarean section with delivery of baby girl at 6. Apgars 3/3/6  . CESAREAN SECTION N/A 08/03/2016   Procedure: CESAREAN SECTION;  Surgeon: Osborne Oman, MD;  Location: [facility];  Service: Obstetrics;  Laterality: N/A;  . CHOLECYSTECTOMY  02/2009  . DILATION AND CURETTAGE OF UTERUS N/A 06/25/2014   Procedure: DILATATION AND CURETTAGE;  Surgeon: Donnamae Jude, MD;  Location: [facility];  Service: Gynecology;  Laterality: N/A;  . LUMBAR LAMINECTOMY/DECOMPRESSION MICRODISCECTOMY Left 04/10/2015   Procedure: Left L5-S1 Microdiscectomy;  Surgeon: Marybelle Killings, MD;  Location: [facility];  Service: Orthopedics;  Laterality: Left;    OB History  Gravida Para Term Preterm AB Living   6 4 3 1 2 3    SAB TAB Ectopic Multiple Live Births   1 1   0 4       Home Medications    Prior to Admission medications   Medication Sig Start Date End Date Taking? Authorizing Provider  ACCU-CHEK SMARTVIEW test strip U UTD QID 07/21/16   [provider]  acetaminophen (TYLENOL) 500 MG tablet Take 1,000  mg by mouth every 6 (six) hours as needed for mild pain or headache.     [provider]  amLODipine (NORVASC) 10 MG tablet Take 10 mg by mouth daily.    [provider]  enalapril (VASOTEC) 10 MG tablet Take 1 tablet (10 mg total) by mouth daily. 08/06/16   Bufford Lope, DO  ferrous sulfate 325 (65 FE) MG tablet Take 1 tablet (325 mg total) by mouth 3 (three) times daily with meals. Patient not taking: Reported on 09/14/2016 08/06/16   Tamala Julian, [place], CNM  ibuprofen (ADVIL,MOTRIN) 600 MG tablet Take 1 tablet (600 mg total) by mouth every 6 (six) hours. Patient not taking: Reported on 09/14/2016 08/06/16   Tamala Julian, [place], CNM  metFORMIN (GLUCOPHAGE) 500 MG tablet Take 1 tablet (500 mg total) by mouth 2 (two) times daily with a meal. 09/14/16   Constant, Peggy, MD  Prenatal Vit-Fe Fumarate-FA (PRENATAL MULTIVITAMIN) TABS tablet Take 1 tablet by mouth daily.     [provider]    Family History Family History  Problem Relation Age of Onset  . Hypertension Maternal Aunt   . Hypertension Paternal Aunt   . Club foot Daughter   . Hirschsprung's disease Daughter   . Learning disabilities Daughter   . Polydactyly Daughter   . Anesthesia problems Neg Hx   . Hypotension Neg Hx   . Malignant hyperthermia Neg Hx   . Pseudochol deficiency Neg Hx     Social History Social History  Substance Use Topics  . Smoking status: Former Smoker    Packs/day: 0.25    Years: 16.00    Types: Cigarettes    Quit date: 12/22/2015  . Smokeless tobacco: Never Used  . Alcohol use No     Allergies   Amoxicillin   Review of Systems Review of Systems  Constitutional: Negative for chills and fever.  Respiratory: Negative for shortness of breath.   Cardiovascular: Negative for chest pain.  Gastrointestinal: Negative for abdominal pain, constipation, diarrhea, nausea and vomiting.  Genitourinary: Negative for dysuria and hematuria.  Musculoskeletal: Positive for joint swelling and  myalgias. Negative for arthralgias.  Skin: Negative for color change.  Allergic/Immunologic: Positive for immunocompromised state.  Neurological: Positive for numbness. Negative for weakness.  Psychiatric/Behavioral: Negative for confusion.  All other systems reviewed and are negative for acute change except as noted in the HPI.  Physical Exam Updated Vital Signs BP (!) 143/94 (BP Location: Left Arm)   Pulse 79   Temp 98.5 F (36.9 C) (Oral)   Resp 18   LMP 04/12/2017 (Within Days)   SpO2 99%   Physical Exam  Constitutional: She is oriented to person, place, and time. Vital signs are normal. She appears well-developed and well-nourished.  Non-toxic appearance. No distress.  Afebrile, nontoxic, NAD  HENT:  Head: Normocephalic and atraumatic.  Mouth/Throat: Mucous membranes are normal.  Eyes: Conjunctivae and EOM are normal. Right eye exhibits no discharge. Left eye exhibits no discharge.  Neck: Normal range of motion. Neck supple.  Cardiovascular: Normal rate and intact  distal pulses.   Pulmonary/Chest: Effort normal. No respiratory distress.  Abdominal: Normal appearance. She exhibits no distension.  Musculoskeletal: Normal range of motion.       Right wrist: She exhibits tenderness. She exhibits normal range of motion, no bony tenderness, no swelling, no effusion, no crepitus, no deformity and no laceration.  Right wrist with FROM intact, no swelling or effusion, no warmth or erythema, no bruising, with diffuse tenderness particularly on volar aspect of the wrist. +Tinel's sign. Strength and sensation grossly intact, distal pulses intact, compartments soft.  Neurological: She is alert and oriented to person, place, and time. She has normal strength. No sensory deficit.  Skin: Skin is warm, dry and intact. No rash noted.  Psychiatric: She has a normal mood and affect. Her behavior is normal.  Nursing note and vitals reviewed.    ED Treatments / Results  DIAGNOSTIC  STUDIES: Oxygen Saturation is 99% on RA, normal by my interpretation.   COORDINATION OF CARE: 10:17 AM- Will give referral to orthopedist. Encouraged pt to continue wearing brace and apply heat or ice therapy. Will order pain medication prior to discharge. Pt verbalizes understanding and agrees to plan.  Medications  meloxicam (MOBIC) tablet 15 mg (15 mg Oral Given 05/06/17 1042)    Labs (all labs ordered are listed, but only abnormal results are displayed) Labs Reviewed - No data to display  EKG  EKG Interpretation None       Radiology Dg Hand Complete Right  Result Date: 05/06/2017 CLINICAL DATA:  Worsening pain in the right thumb and arm over the past several days. EXAM: RIGHT HAND - COMPLETE 3+ VIEW COMPARISON:  Right wrist radiographs 02/23/2013 FINDINGS: There is no evidence of fracture or dislocation. There is no evidence of arthropathy or other focal bone abnormality. Soft tissues are unremarkable. IMPRESSION: Negative. Electronically Signed   By: Logan Bores M.D.   On: 05/06/2017 09:52    Procedures Procedures (including critical care time)  Medications Ordered in ED Medications  meloxicam (MOBIC) tablet 15 mg (15 mg Oral Given 05/06/17 1042)     Initial Impression / Assessment and Plan / ED Course  I have reviewed the triage vital signs and the nursing notes.  Pertinent labs & imaging results that were available during my care of the patient were reviewed by me and considered in my medical decision making (see chart for details).     41 y.o. female here with R wrist pain due to carpal tunnel; intermittent paresthesias as well. On exam, no swelling/redness/warmth, NVI with soft compartments, +tinel's sign, diffuse volar wrist tenderness. Xray obtained in triage which is negative, unclear why this was done as it wasn't really necessary for the evaluation today. Pt does repetitive movements which likely contributes to her carpal tunnel. Advised use of hard volar wrist  cock up splint during the day for the next week to allow wrist to rest; adequate sugar control advised. RICE, and alternating heat as well advised. Will rx mobic, discussed use of tylenol. Will refer to hand specialist for ongoing management of carpal tunnel. I explained the diagnosis and have given explicit precautions to return to the ER including for any other new or worsening symptoms. The patient understands and accepts the medical plan as it's been dictated and I have answered their questions. Discharge instructions concerning home care and prescriptions have been given. The patient is STABLE and is discharged to home in good condition.   I personally performed the services described in this documentation, which  was scribed in my presence. The recorded information has been reviewed and is accurate.   Final Clinical Impressions(s) / ED Diagnoses   Final diagnoses:  Carpal tunnel syndrome on right    New Prescriptions New Prescriptions   No medications on [address], [place], [place] 05/06/17 [place]    Drenda Freeze, MD 05/08/17 [phone]

## 2017-05-06 NOTE — ED Triage Notes (Signed)
Pt with know R carpal tunnel syndrome and patient had worsening pain x several days. Pt states she has been wearing brace and using OTC meds without any relief.

## 2017-05-06 NOTE — Discharge Instructions (Signed)
Wear wrist brace for the next week or so to allow your wrist to rest. Alternate between ice and heat, and elevate wrist throughout the day, using ice or heat pack for no more than 20 minutes every hour.  Use mobic daily as instructed, using tylenol as needed for additional pain relief. Call hand specialist follow up today or tomorrow to schedule followup appointment for recheck of ongoing wrist pain in 1-2 weeks. Return to the ER for changes or worsening symptoms.

## 2017-05-06 NOTE — ED Notes (Signed)
Bed: WTR7 Expected date:  Expected time:  Means of arrival:  Comments: 

## 2017-07-13 IMAGING — US US OB COMP LESS 14 WK
1 series · 15 of 28 positions shown · non-contrast
Comparison: None.

CLINICAL DATA: Lower abdominal pain for 2 weeks

EXAM:
OBSTETRIC <14 WK US AND TRANSVAGINAL OB US
TECHNIQUE: Both transabdominal and transvaginal ultrasound examinations were
performed for complete evaluation of the gestation as well as the
maternal uterus, adnexal regions, and pelvic cul-de-sac.
Transvaginal technique was performed to assess early pregnancy.

[Series 1: us ob comp less 14 wk · 113 acquisitions, 15 frames shown]
[im 1/113]
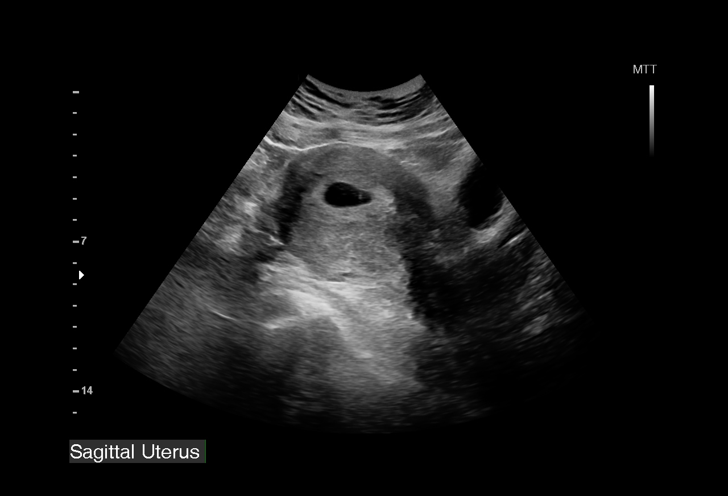
[im 9/113]
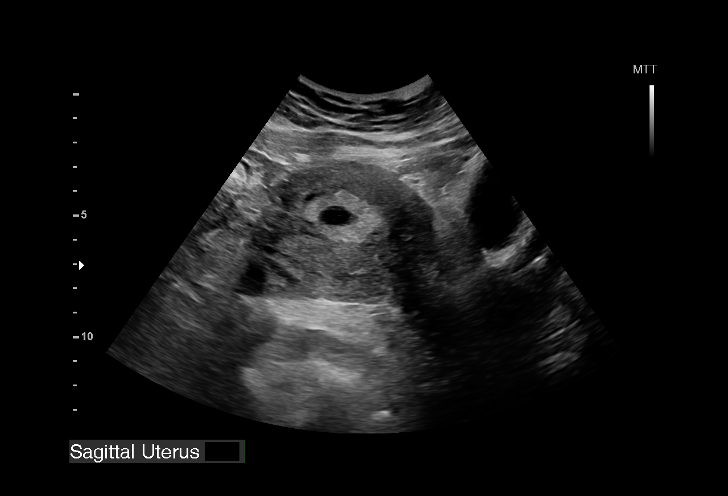
[im 17/113]
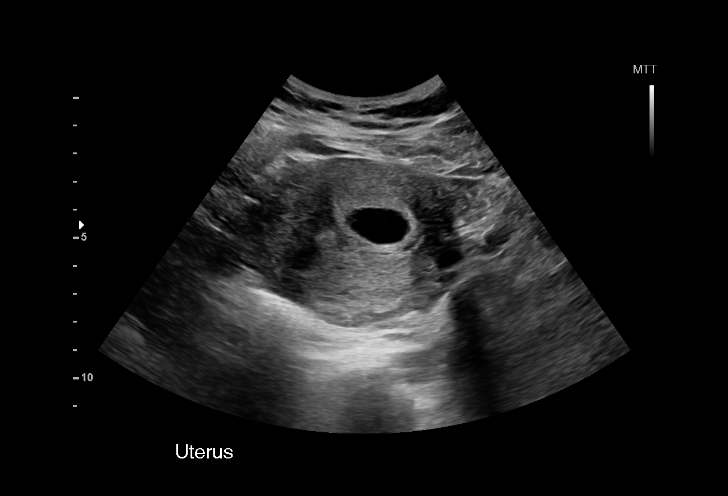
[im 25/113]
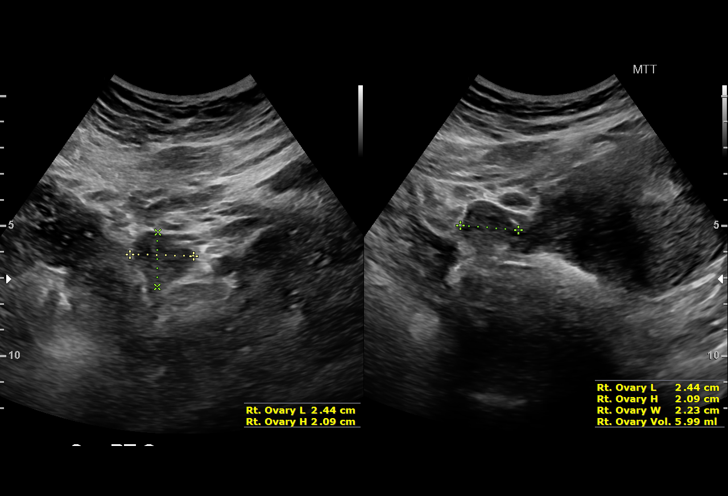
[im 34/113]
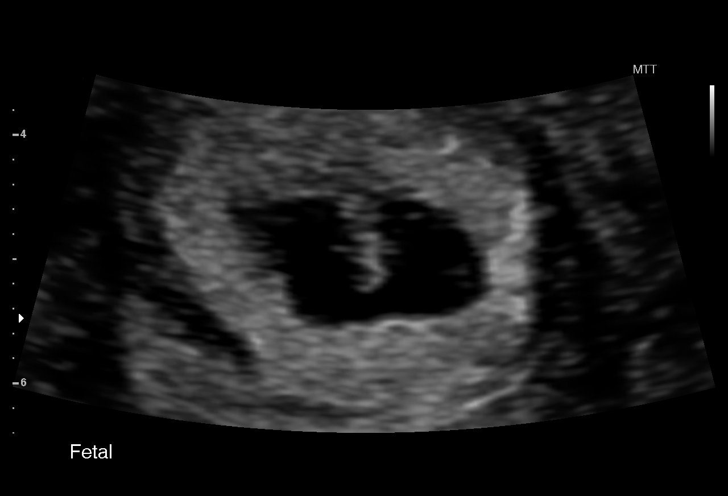
[im 42/113]
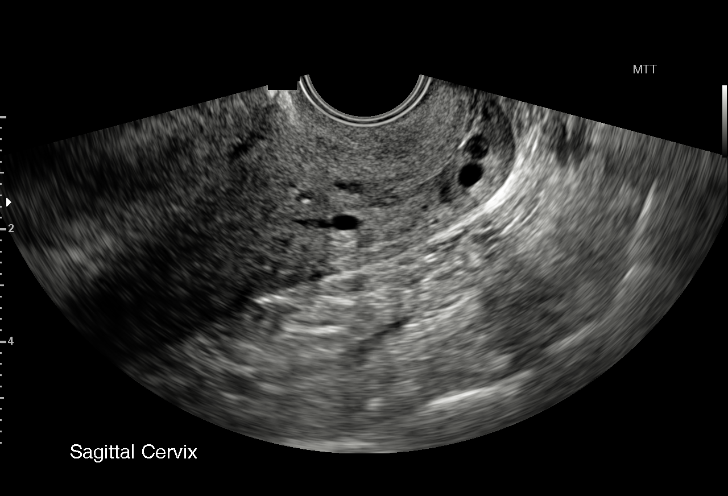
[im 50/113]
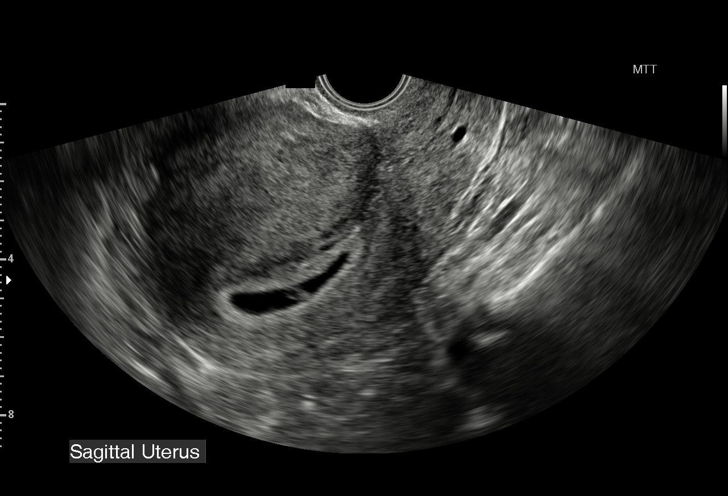
[im 59/113]
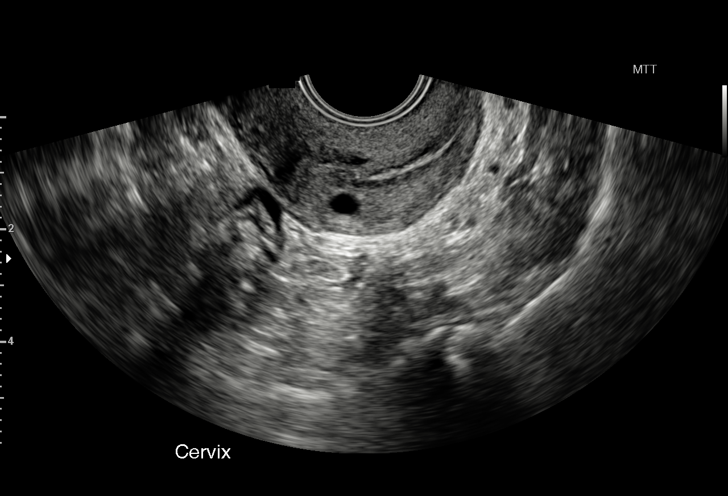
[im 63/113]
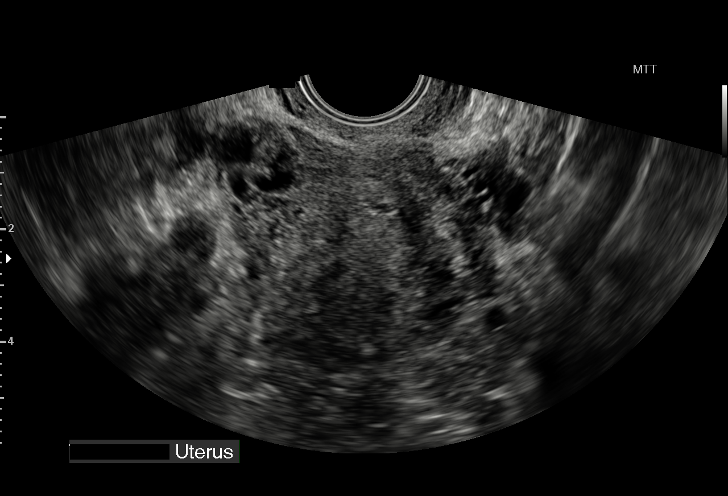
[im 71/113]
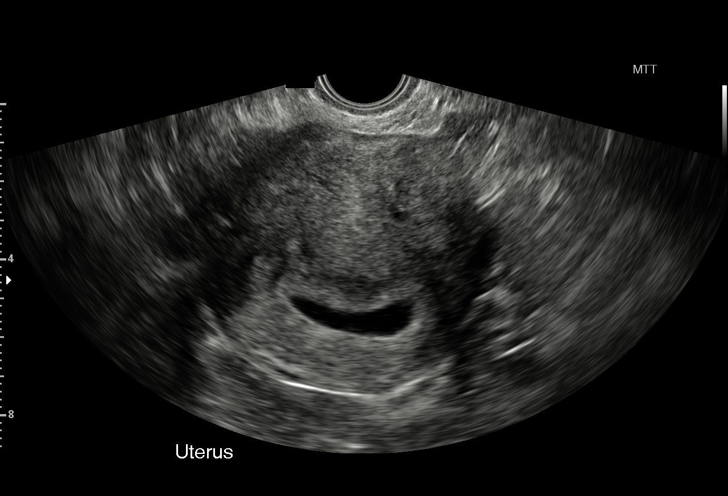
[im 79/113]
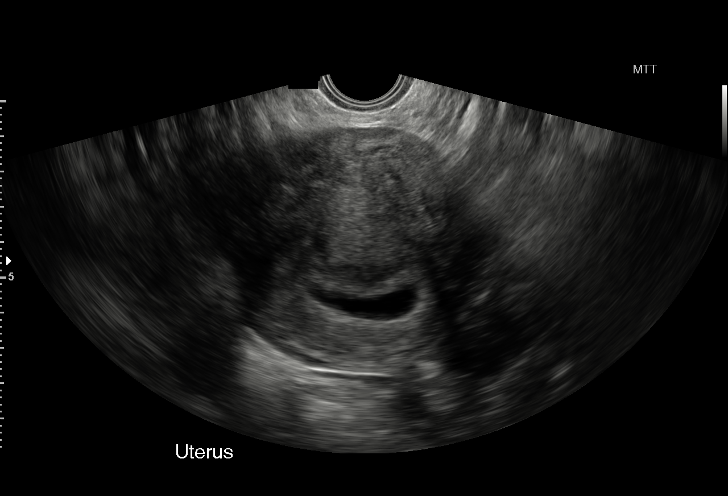
[im 88/113]
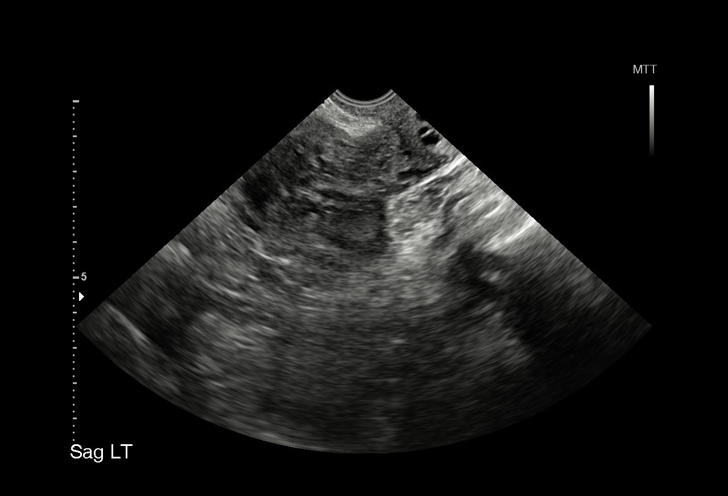
[im 96/113]
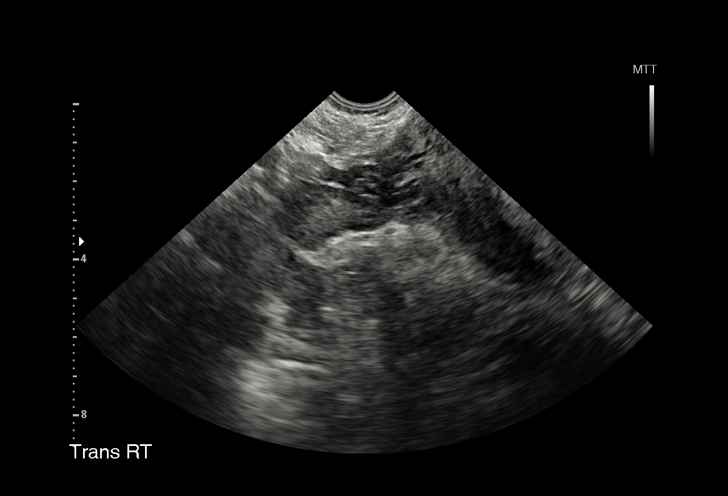
[im 104/113]
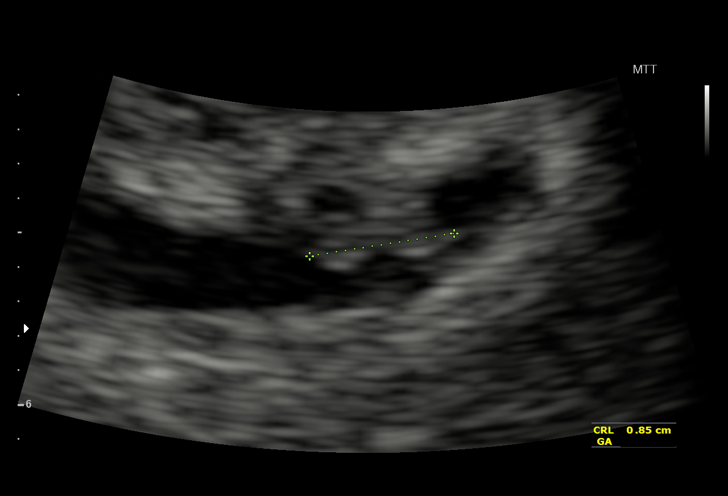
[im 113/113]
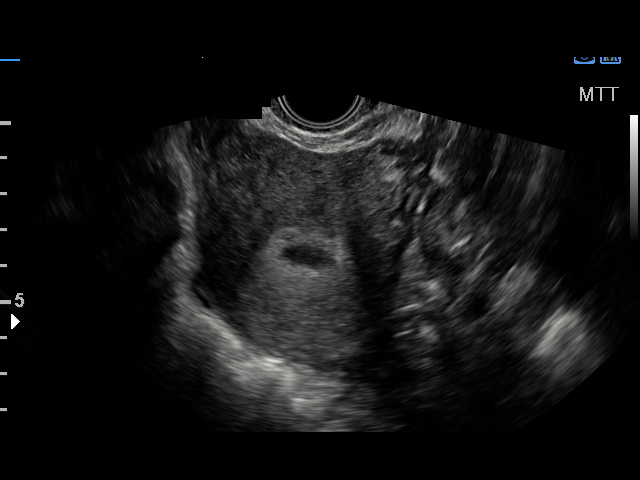

[15 of 28 positions shown; findings below may reference images not displayed]

FINDINGS: Intrauterine gestational sac: Visualized/normal in shape.

Yolk sac:  Present

Embryo:  Present

Cardiac Activity: Present

Heart Rate: 124  bpm

CRL:  8.2  mm   6 w   5 d                  US EDC: 08/27/2016

Subchorionic hemorrhage:  Small subchorionic hemorrhage.

Maternal uterus/adnexae: Normal bilateral ovaries. Left corpus
luteum cyst. Trace pelvic free fluid.
IMPRESSION: 1. Single live intrauterine pregnancy dating 6 weeks 5 days.
2. Small subchorionic hemorrhage.

## 2017-12-16 IMAGING — US US MFM OB FOLLOW-UP
1 series · 14 of 28 positions shown · non-contrast
Comparison: none

[Series 1: us mfm ob follow-up · 55 acquisitions, 14 frames shown]
[im 3/55]
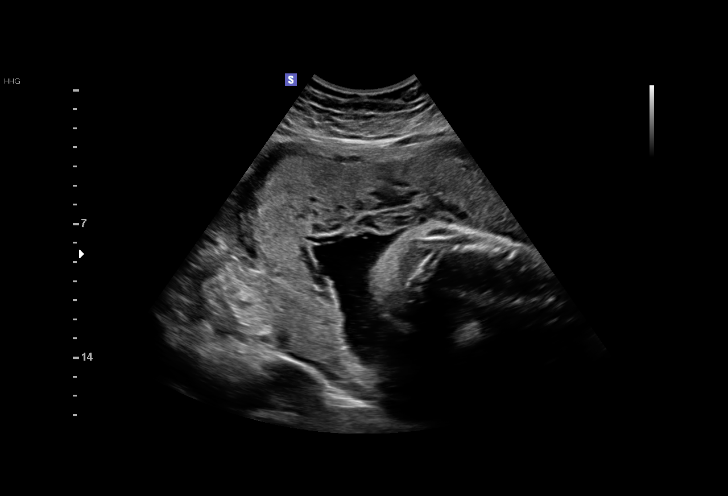
[im 7/55]
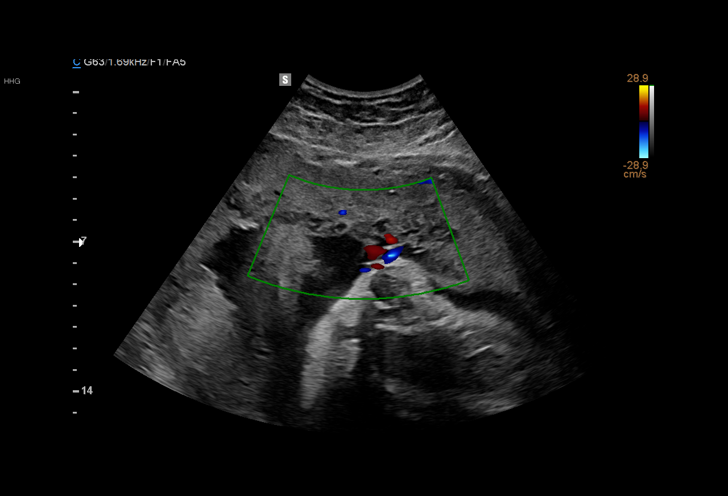
[im 11/55]
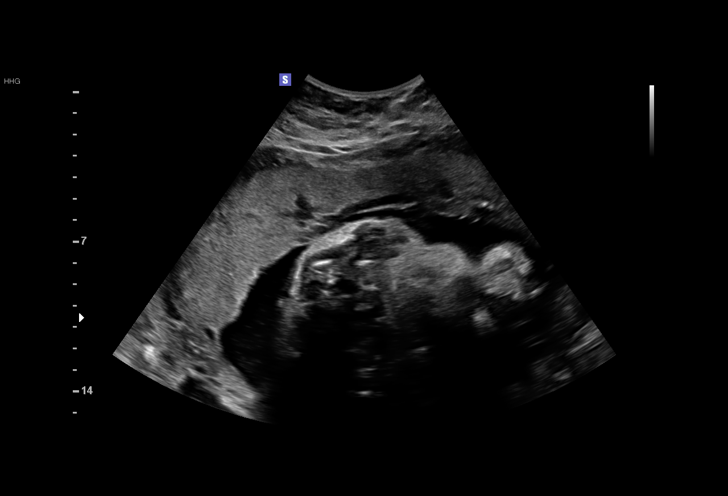
[im 15/55]
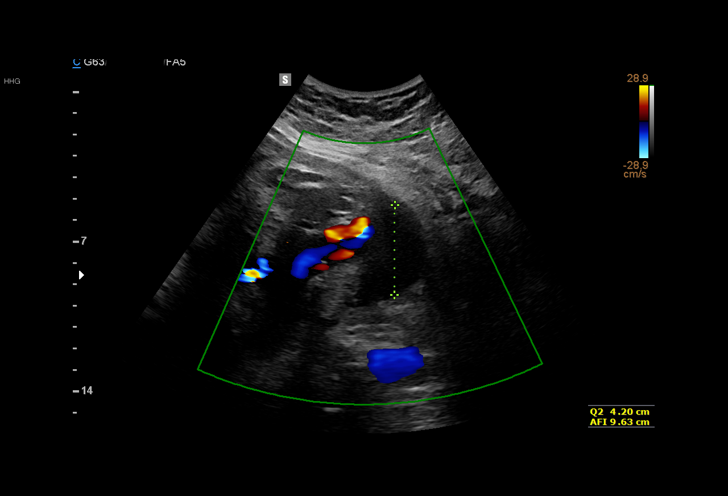
[im 19/55]
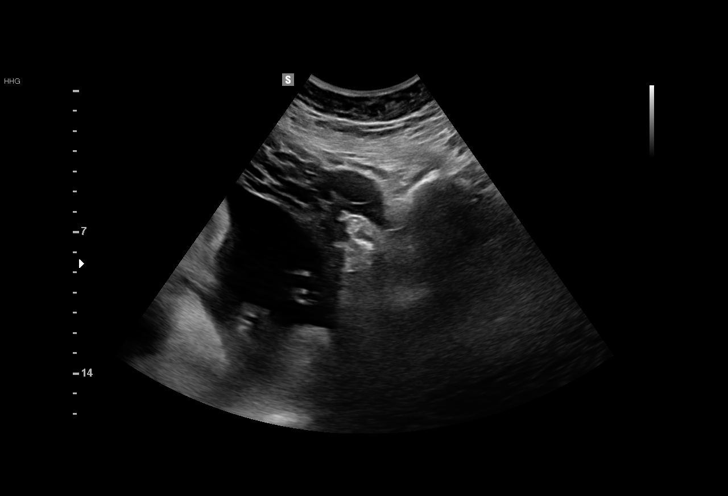
[im 23/55]
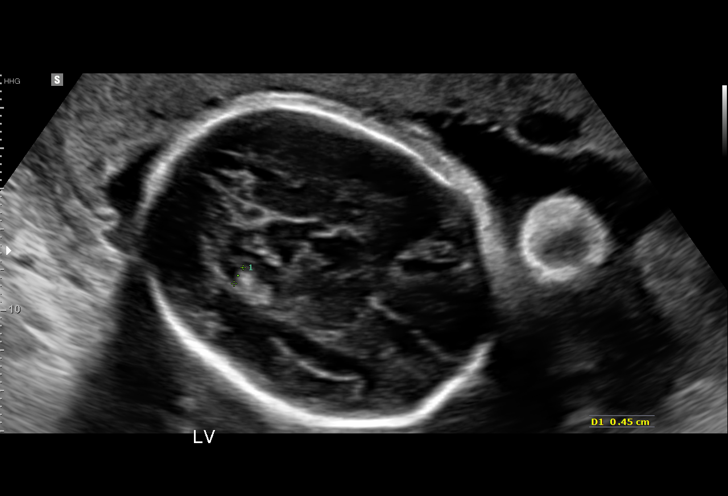
[im 27/55]
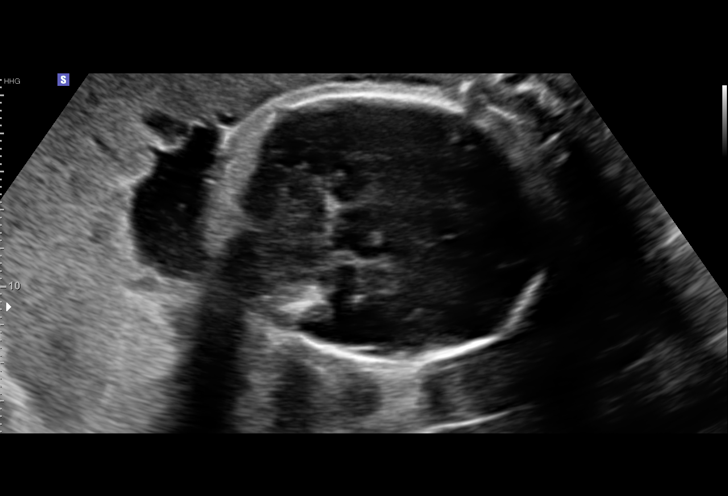
[im 31/55]
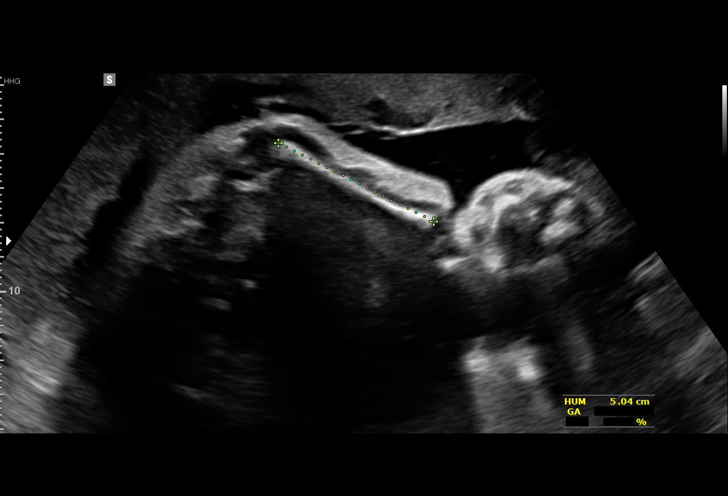
[im 35/55]
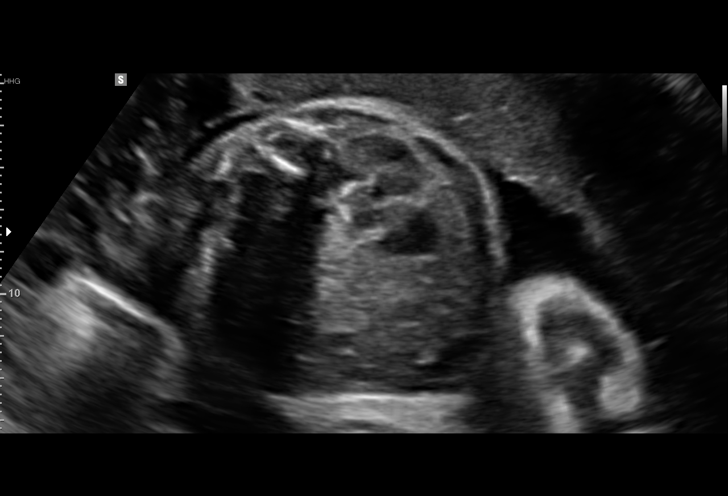
[im 39/55]
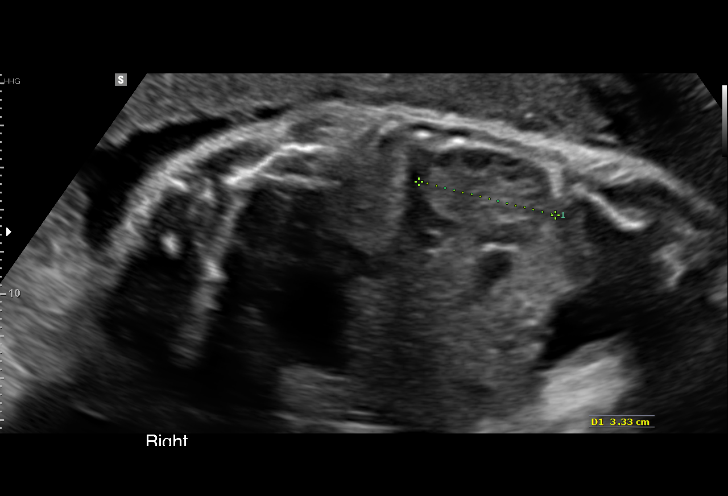
[im 43/55]
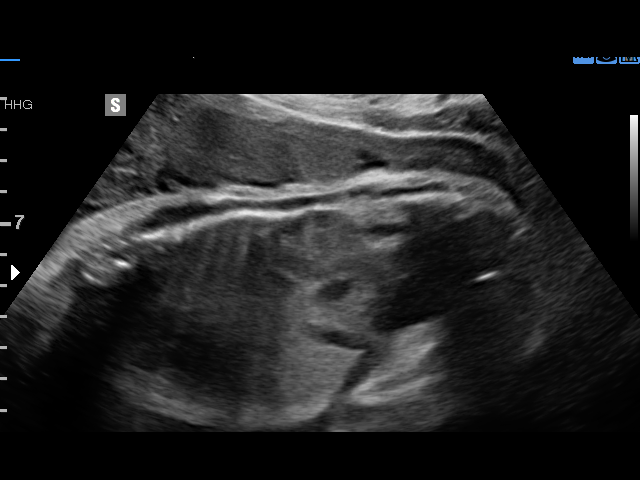
[im 47/55]
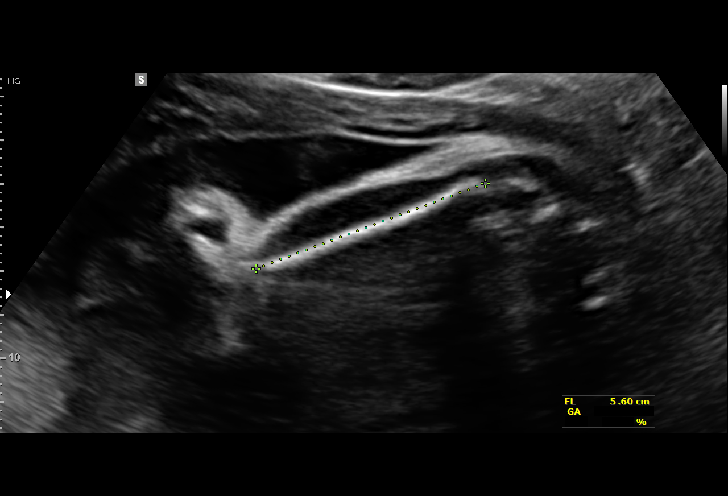
[im 51/55]
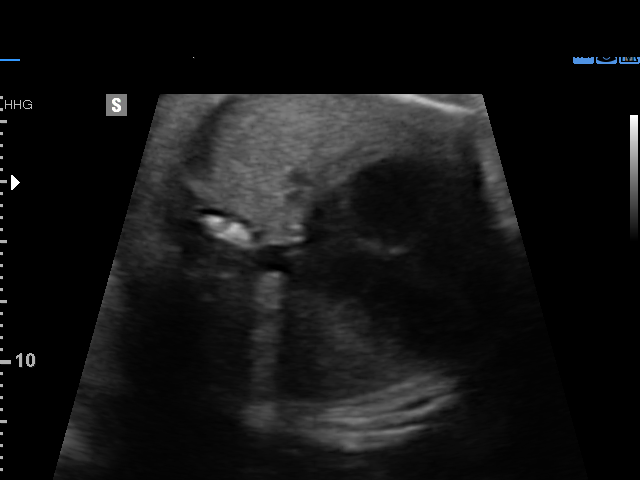
[im 55/55]
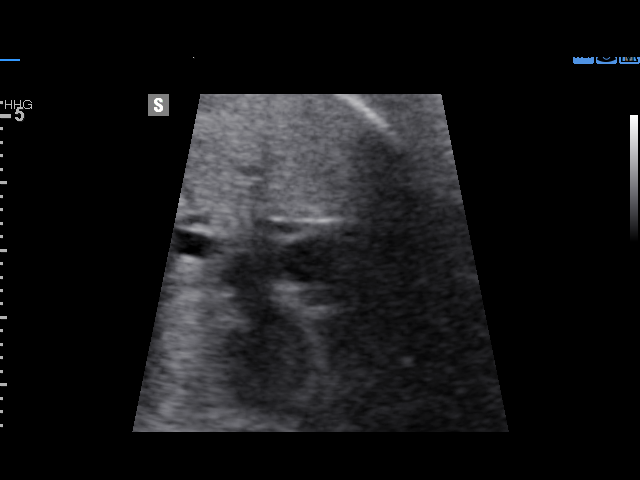

[14 of 28 positions shown; findings below may reference images not displayed]

1  MERAPELO TIMOTHY T LEWETSE            [id number]      [id number]     [id number]
Indications

29 weeks gestation of pregnancy
Pre-existing diabetes, type 2, in pregnancy,
third trimester; glyburide
Hypertension - Chronic/Pre-existing;
labetalol and Norvasc; ASA
Advanced maternal age multigravida 35+,
third trimester ([facility]/Avis Sato, declined screening &
testing)
Previous child with congenital anomaly
(bilateral club foot, preaxial polydactyly of
the hand, Hirschsprung disease, intellectual
disability)
Poor obstetric history: Previous neonatal
death (3 days old, hypoxic-ischemic
encephalopathy, seizures, pulmonary
hypertension; prenatal: poly, )
Obesity complicating pregnancy, third
trimester
Previous cesarean delivery, antepartum
OB History

Blood Type:            Height:         Weight (lb):  226.6    BMI:
Gravidity:    6         Term:   3        Prem:   0        SAB:   1
TOP:          1       Ectopic:  0        Living: 3
Fetal Evaluation

Num Of Fetuses:     1
Fetal Heart         133
Rate(bpm):
Cardiac Activity:   Observed
Presentation:       Breech
Placenta:           Anterior Fundal, above cervical os
P. Cord Insertion:  Visualized, central

Amniotic Fluid
AFI FV:      Subjectively within normal limits

AFI Sum(cm)     %Tile       Largest Pocket(cm)
17.81           67

RUQ(cm)       RLQ(cm)       LUQ(cm)        LLQ(cm)
5.44
Biometry

BPD:      69.4  mm     G. Age:  27w 6d         10  %    CI:        69.47   %   70 - 86
FL/HC:      20.9   %   19.6 -
HC:      265.8  mm     G. Age:  29w 0d         18  %    HC/AC:      1.03       0.99 -
AC:      257.2  mm     G. Age:  29w 6d         70  %    FL/BPD:     80.0   %   71 - 87
FL:       55.5  mm     G. Age:  29w 2d         42  %    FL/AC:      21.6   %   20 - 24
HUM:      50.2  mm     G. Age:  29w 3d         52  %

Est. FW:    0787  gm      3 lb 1 oz     59  %
Gestational Age

U/S Today:     29w 0d                                        EDD:   08/27/16
Best:          29w 0d    Det. By:   Early Ultrasound         EDD:   08/27/16
(01/07/16)
Anatomy

Cranium:               Appears normal         Aortic Arch:            Previously seen
Cavum:                 Previously seen        Ductal Arch:            Previously seen
Ventricles:            Appears normal         Diaphragm:              Appears normal
Choroid Plexus:        Appears normal         Stomach:                Appears normal, left
sided
Cerebellum:            Previously seen        Abdomen:                Appears normal
Posterior Fossa:       Previously seen        Abdominal Wall:         Previously seen
Nuchal Fold:           Not applicable (>20    Cord Vessels:           Previously seen
wks GA)
Face:                  Orbits and profile     Kidneys:                Appear normal
previously seen
Lips:                  Previously seen        Bladder:                Appears normal
Thoracic:              Appears normal         Spine:                  Previously seen
Heart:                 Previously seen        Upper Extremities:      Previously seen
RVOT:                  Appears normal         Lower Extremities:      Previously seen
LVOT:                  Previously seen

Other:  Female gender previously seen. Heels and 5th digit previously seen.
Technically difficult due to maternal habitus and fetal position.
Cervix Uterus Adnexa

Cervix
Not visualized (advanced GA >67wks)

Uterus
No abnormality visualized.

Left Ovary
Not visualized.
Right Ovary
Not visualized.
Impression

SIUP at 29+0 weeks
Normal interval anatomy; anatomic survey complete
Normal amniotic fluid volume
Appropriate interval growth with EFW at the 59th %tile

BSs reviewed: most all in targeted range; no change in
glyburide dosage
Recommendations

Start twice weekly NSTs with weekly AFIs in 3 weeks
(scheduled here)
Follow-up ultrasound for growth in 4 weeks

## 2018-01-10 IMAGING — US US MFM OB FOLLOW-UP
1 series · 13 of 28 positions shown · non-contrast
Comparison: none

[Series 1: us mfm ob follow-up · 13 of 31 slices shown]
[im 2/31]
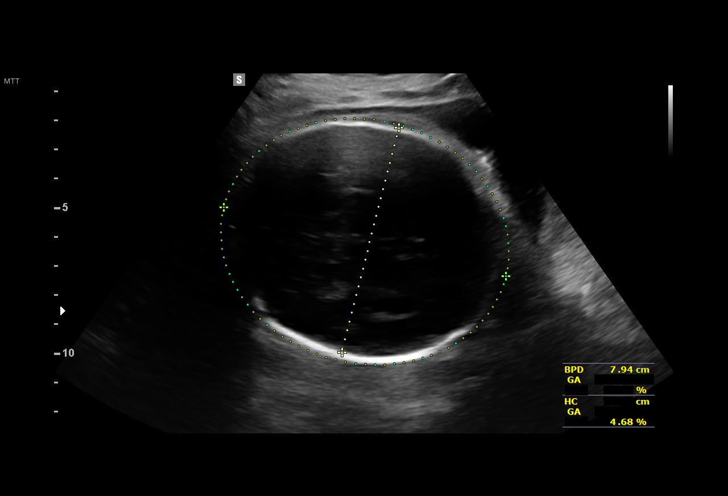
[im 4/31]
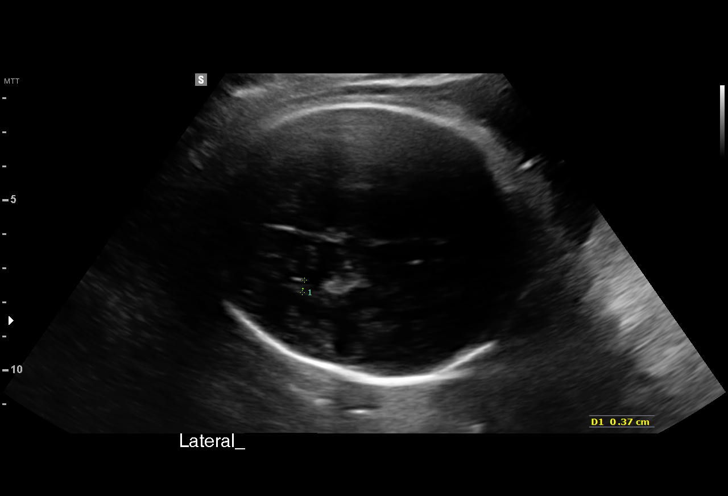
[im 6/31]
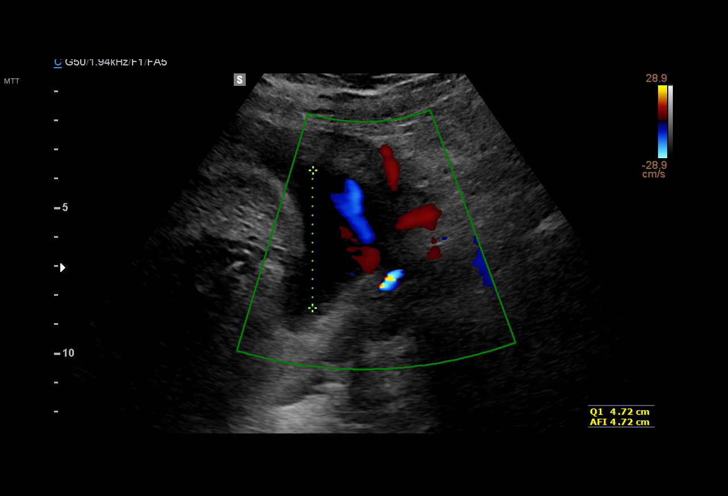
[im 8/31]
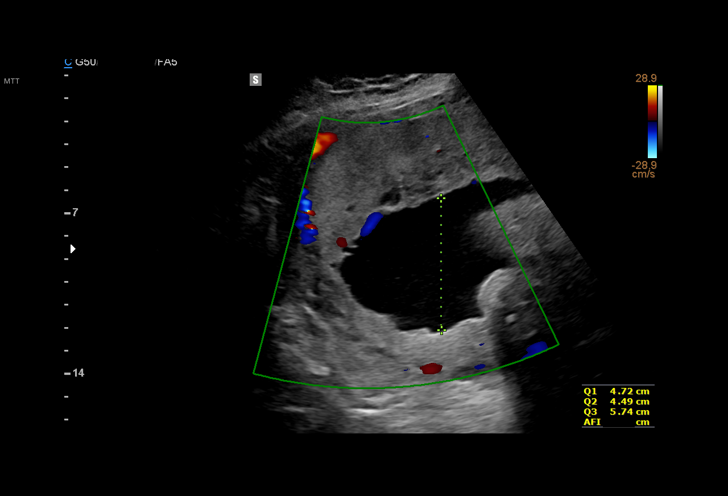
[im 11/31]
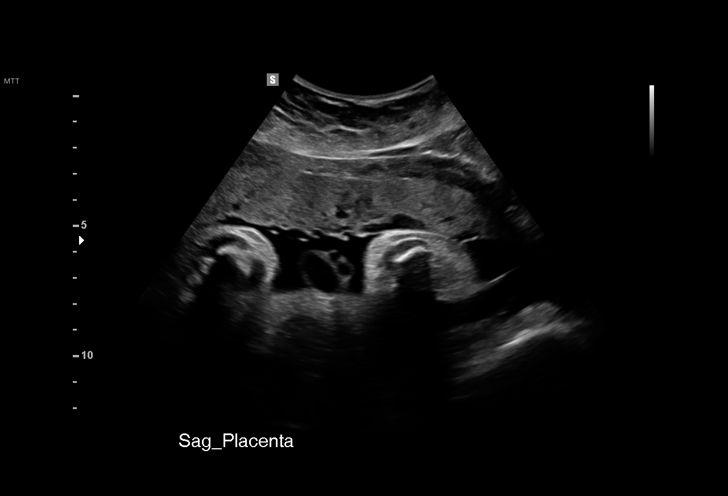
[im 13/31]
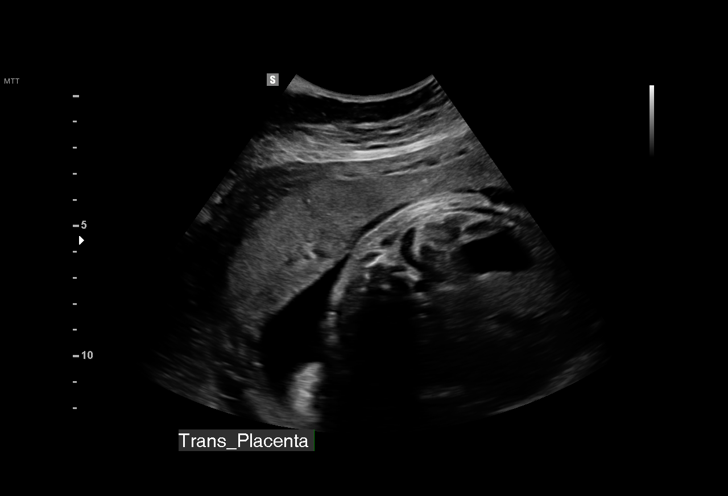
[im 16/31]
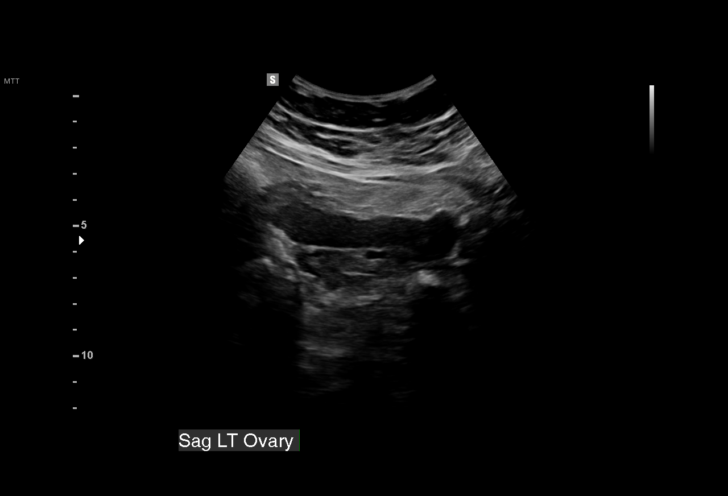
[im 18/31]
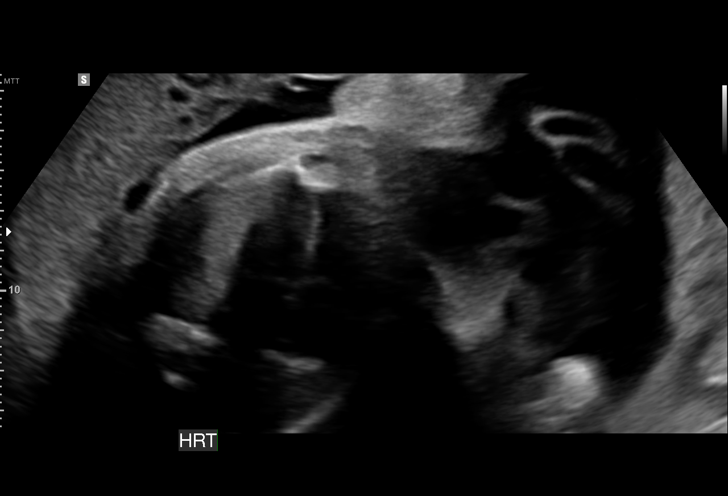
[im 21/31]
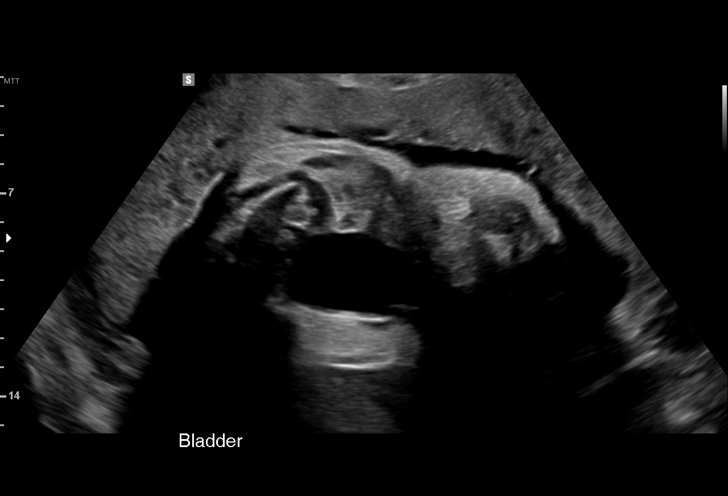
[im 23/31]
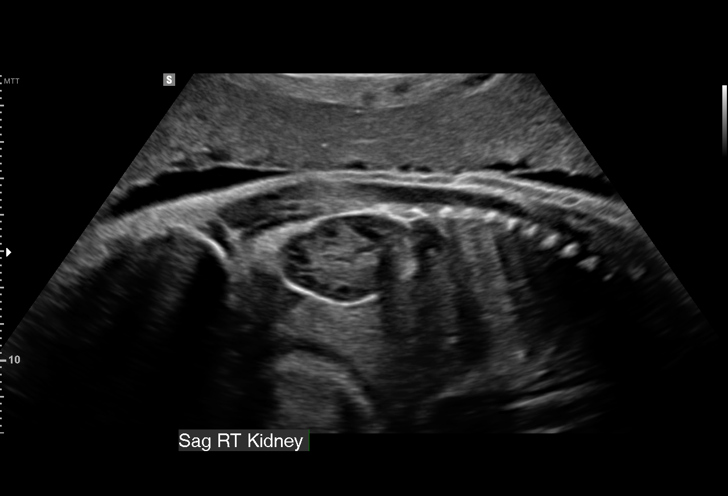
[im 25/31]
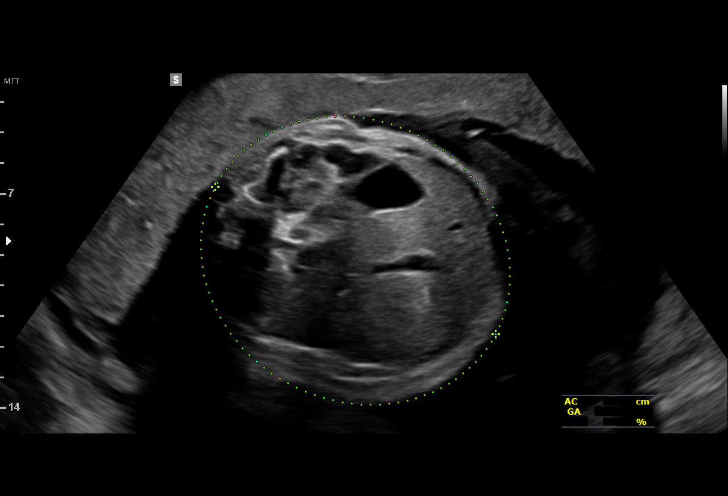
[im 27/31]
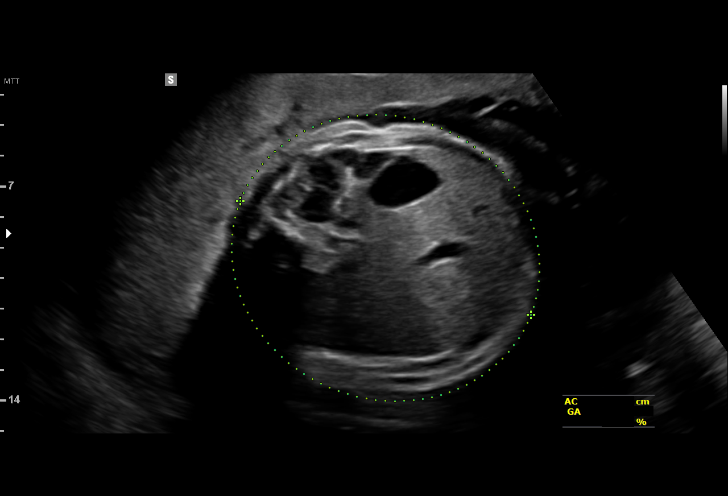
[im 29/31]
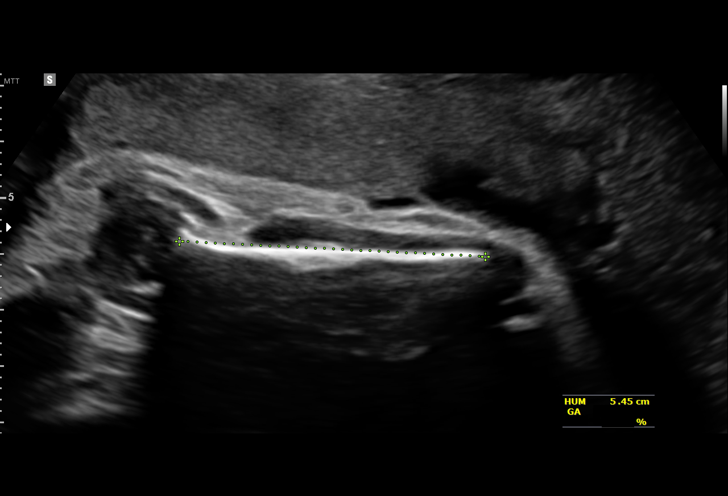

[13 of 28 positions shown; findings below may reference images not displayed]

1  ZENEN FATIMA            [id number]      [id number]     [id number]
Indications

32 weeks gestation of pregnancy
Diabetes - Pregestational,3rd trimester
(glyburide)
Hypertension - Chronic/Pre-existing;
labetalol and Norvasc; ASA
Advanced maternal age multigravida 35+,
third trimester (Lui Abdi/Shisheng Imaoka, declined screening &
testing)
Previous child with congenital anomaly
(bilateral club foot, preaxial polydactyly of the
hand, Hirschsprung disease, intellectual
disability)
Poor obstetric history: Previous neonatal
death (3 days old, hypoxic-ischemic
encephalopathy, seizures, pulmonary
hypertension; prenatal: poly, )
Obesity complicating pregnancy, third
trimester
Previous cesarean delivery, antepartum
OB History

Blood Type:            Height:         Weight (lb):  226.6     BMI:
Gravidity:    6         Term:   3        Prem:   0         SAB:   1
TOP:          1       Ectopic:  0        Living: 3
Fetal Evaluation

Num Of Fetuses:     1
Fetal Heart         144
Rate(bpm):
Cardiac Activity:   Observed
Presentation:       Cephalic
Placenta:           Anterior, above cervical os
P. Cord Insertion:  Previously Visualized

Amniotic Fluid
AFI FV:      Subjectively within normal limits

AFI Sum(cm)     %Tile       Largest Pocket(cm)
20.19           76

RUQ(cm)       RLQ(cm)       LUQ(cm)        LLQ(cm)
4.72
Biometry

BPD:      79.7  mm     G. Age:  32w 0d         26  %    CI:         76.31  %    70 - 86
FL/HC:       20.8  %    19.9 -
HC:      289.1  mm     G. Age:  31w 6d          6  %    HC/AC:       0.93       0.96 -
AC:      309.2  mm     G. Age:  34w 6d         96  %    FL/BPD:      75.3  %    71 - 87
FL:         60  mm     G. Age:  31w 1d         10  %    FL/AC:       19.4  %    20 - 24
HUM:      54.1  mm     G. Age:  31w 3d         33  %

Est. FW:    3390   gm    4 lb 12 oz     71  %
Gestational Age

U/S Today:     32w 3d                                        EDD:    08/28/16
Best:          32w 4d     Det. By:  Early Ultrasound         EDD:    08/27/16
(01/07/16)
Anatomy

Cranium:               Appears normal         Aortic Arch:            Previously seen
Cavum:                 Appears normal         Ductal Arch:            Previously seen
Ventricles:            Appears normal         Diaphragm:              Previously seen
Choroid Plexus:        Previously seen        Stomach:                Appears normal, left
sided
Cerebellum:            Previously seen        Abdomen:                Appears normal
Posterior Fossa:       Previously seen        Abdominal Wall:         Previously seen
Nuchal Fold:           Not applicable (>20    Cord Vessels:           Previously seen
wks GA)
Face:                  Orbits and profile     Kidneys:                Appear normal
previously seen
Lips:                  Previously seen        Bladder:                Appears normal
Thoracic:              Appears normal         Spine:                  Previously seen
Heart:                 Appears normal         Upper Extremities:      Previously seen
(4CH, axis, and situs
RVOT:                  Previously seen        Lower Extremities:      Previously seen
LVOT:                  Previously seen

Other:  Female gender previously seen. Heels and 5th digit previously seen.
Technically difficult due to maternal habitus and fetal position.
Cervix Uterus Adnexa

Cervix
Not visualized (advanced GA >27wks)

Left Ovary
Within normal limits.
Right Ovary
Not visualized.

Adnexa:       No abnormality visualized.
Impression

SIUP at 32+4 weeks
Normal interval anatomy; anatomic survey complete
Normal amniotic fluid volume
Appropriate interval growth with EFW at the 71st %tile
NST reactive
Recommendations

Continue twice weekly NSTs with weekly AFIs
Growth US in 4 weeks

## 2018-01-20 IMAGING — US US MFM FETAL BPP W/O NON-STRESS
1 series · 13 of 16 positions shown · non-contrast
Comparison: none

1  HAJA DELVALLE            [id number]      [id number]     [id number]
Indications

34 weeks gestation of pregnancy
Diabetes - Pregestational,3rd trimester
(glyburide)
Hypertension - Chronic/Pre-existing;
labetalol and Norvasc; ASA
Advanced maternal age multigravida 35+,
third trimester (Preshi/Ivomavrud Abazov, declined screening &
testing)
Previous child with congenital anomaly
(bilateral club foot, preaxial polydactyly of
the hand, Hirschsprung disease, intellectual
disability)
Poor obstetric history: Previous neonatal
death (3 days old, hypoxic-ischemic
encephalopathy, seizures, pulmonary
hypertension; prenatal: poly, )
Obesity complicating pregnancy, third
trimester
Non-reactive NST
OB History
Blood Type:            Height:         Weight (lb):  226.6    BMI:
Gravidity:    6         Term:   3        Prem:   0        SAB:   1
TOP:          1       Ectopic:  0        Living: 3
Fetal Evaluation
Num Of Fetuses:     1
Fetal Heart         150
Rate(bpm):
Cardiac Activity:   Observed
Presentation:       Cephalic
Placenta:           Anterior, above cervical os
Amniotic Fluid
AFI FV:      Subjectively within normal limits
AFI Sum(cm)     %Tile       Largest Pocket(cm)
21.53           81
RUQ(cm)       RLQ(cm)       LUQ(cm)        LLQ(cm)
6.75
Biophysical Evaluation
Amniotic F.V:   Within normal limits       F. Tone:        Observed
F. Movement:    Observed                   N.S.T:          Nonreactive
F. Breathing:   Observed                   Score:          [DATE]
Gestational Age
Best:          34w 0d    Det. By:   Early Ultrasound         EDD:   08/27/16
(01/07/16)
Impression
INDICATION: 40 yr old [id number] at 77w9d with type II diabetes
and chronic hypertension for BPP secondary to nonreactive
NST.

[Series 1: us mfm fetal bpp w/o non-stress · 16 acquisitions, 13 frames shown]
[im 1/16]
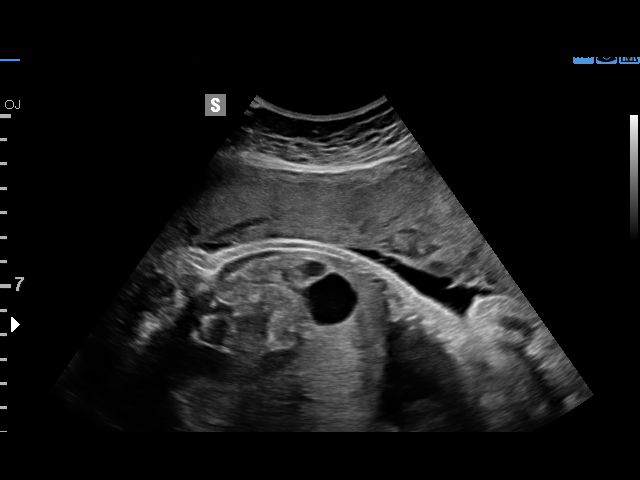
[im 2/16]
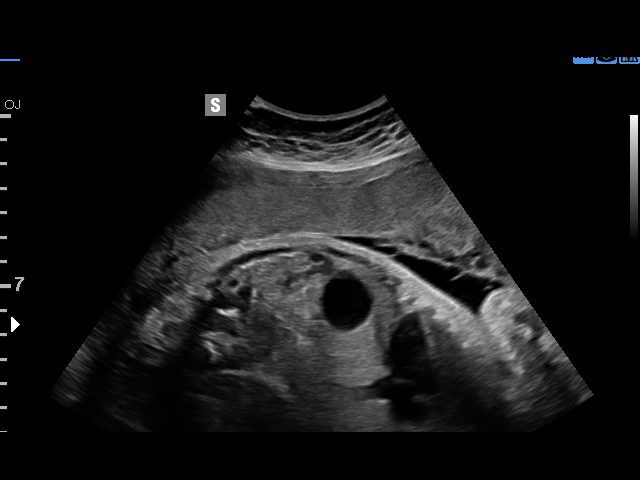
[im 4/16]
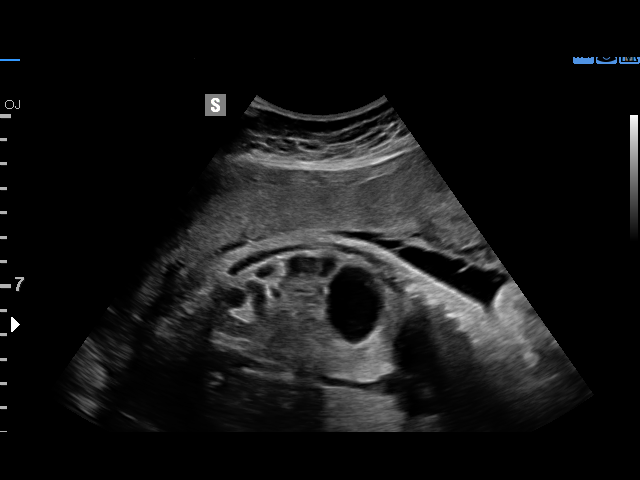
[im 5/16]
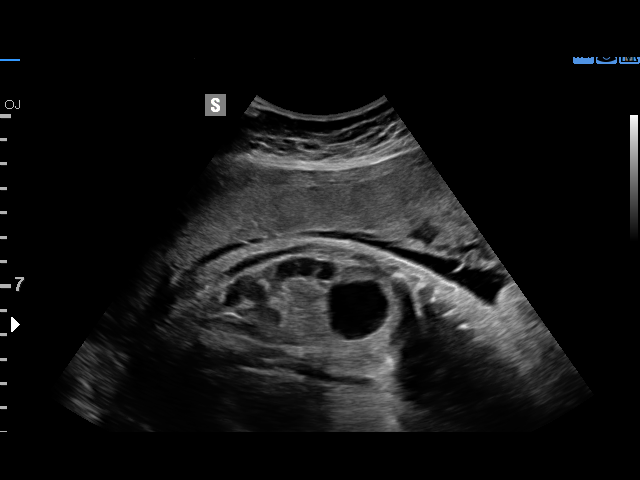
[im 6/16]
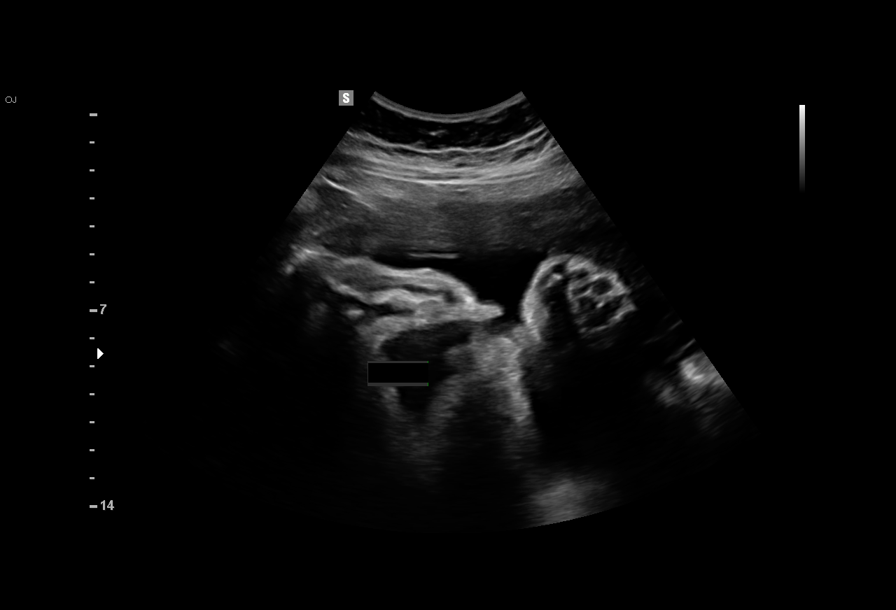
[im 7/16]
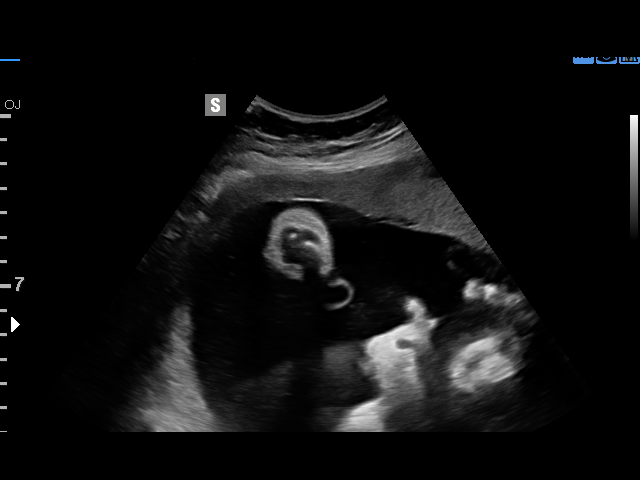
[im 9/16]
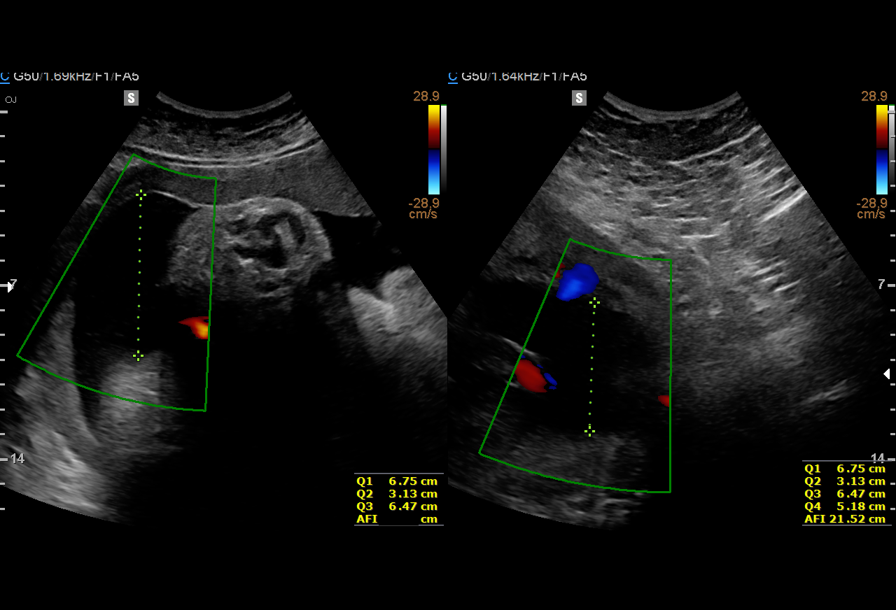
[im 10/16]
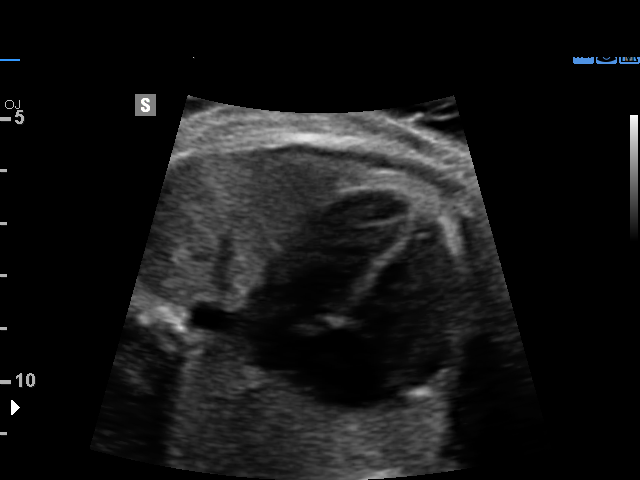
[im 11/16]
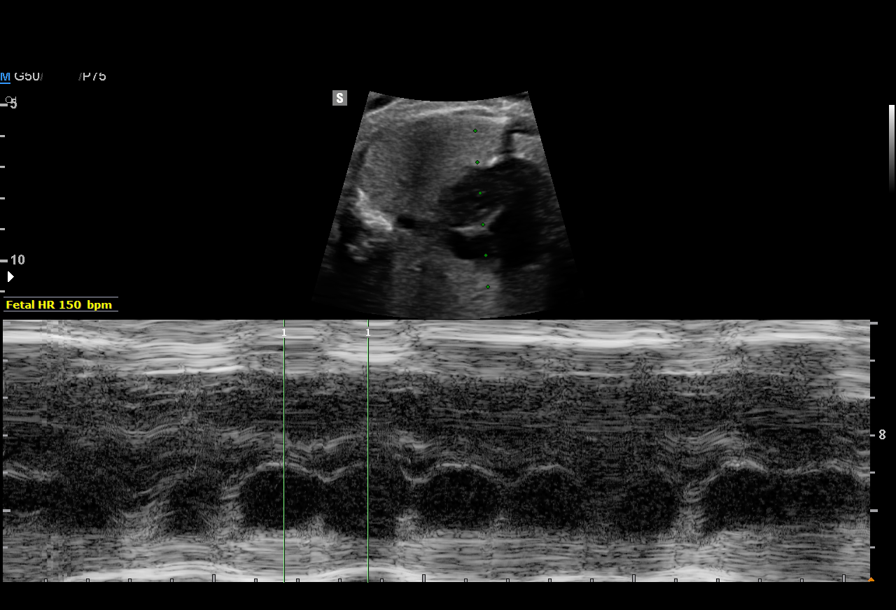
[im 12/16]
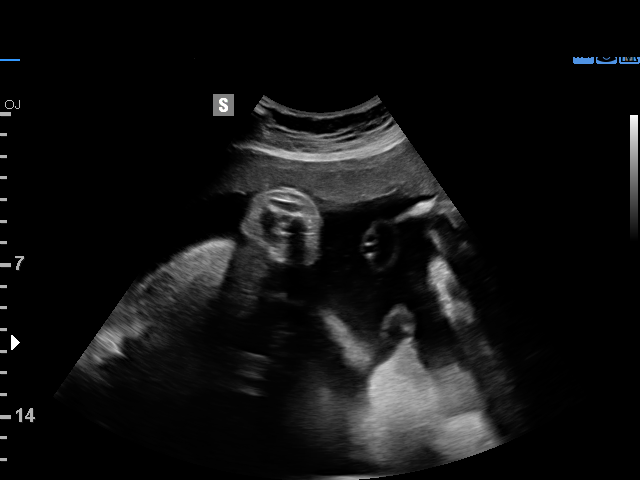
[im 13/16]
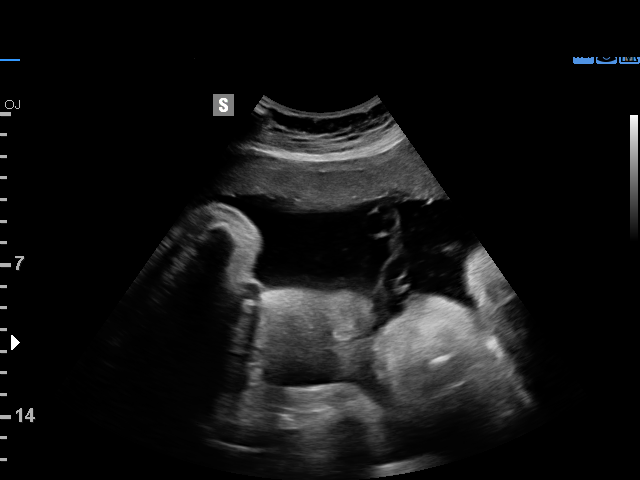
[im 15/16]
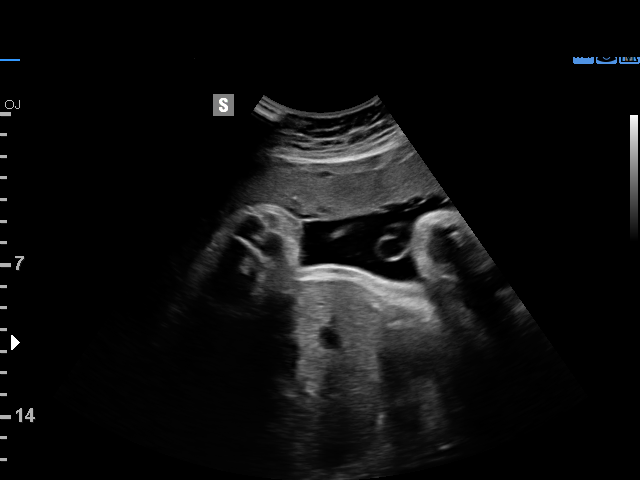
[im 16/16]
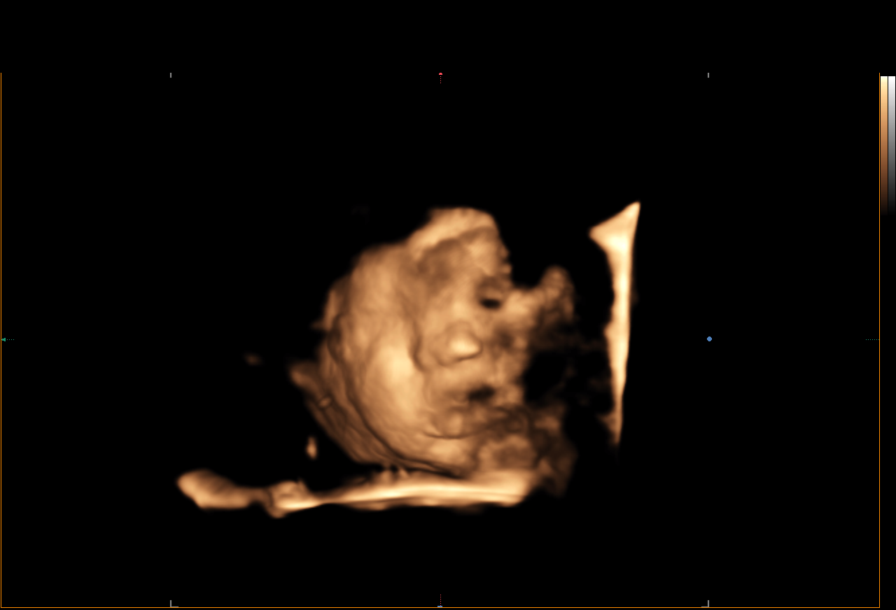

[13 of 16 positions shown; findings below may reference images not displayed]

FINDINGS: 1. Single intrauterine pregnancy.
2. Anterior placenta without evidence of previa.
3. Normal amniotic fluid index.
4. Normal biophysical profile of [DATE].
Recommendations

1. Hypertension:
- previously counseled
- on labetalol and norvasc
- on low dose aspirin
- recommend fetal growth every 4 weeks; due in 2 weeks
- recommend continue antenatal testing
- recommend close surveillance for the development of
signs/symptoms of preeclampsia
2. Diabetes:
- previously counseled
- on glyburide
- recommend fetal surveillance as above
3. Advanced maternal age:
- previously counseled
- declined aneuploidy screening
4. Previous neonatal loss:
- previously counseled
5. Previous child with Zamurio disability, polydactyly, club
foot, and Hirschsprung:
- previously counseled
6. Overall BPP [DATE] today;
- continue antenatal testing

## 2019-10-31 ENCOUNTER — Other Ambulatory Visit: Payer: Self-pay

## 2019-10-31 ENCOUNTER — Encounter (HOSPITAL_COMMUNITY): Payer: Self-pay

## 2019-10-31 ENCOUNTER — Emergency Department (HOSPITAL_COMMUNITY)
Admission: EM | Admit: 2019-10-31 | Discharge: 2019-10-31 | Disposition: A | Discharge status: Home / Self Care | Payer: 59 | Attending: Emergency Medicine | Admitting: Emergency Medicine

## 2019-10-31 ENCOUNTER — Emergency Department (HOSPITAL_COMMUNITY): Payer: 59

## 2019-10-31 DIAGNOSIS — N939 Abnormal uterine and vaginal bleeding, unspecified: Secondary | ICD-10-CM

## 2019-10-31 DIAGNOSIS — Z87891 Personal history of nicotine dependence: Secondary | ICD-10-CM | POA: Insufficient documentation

## 2019-10-31 DIAGNOSIS — I10 Essential (primary) hypertension: Secondary | ICD-10-CM | POA: Insufficient documentation

## 2019-10-31 DIAGNOSIS — Z7984 Long term (current) use of oral hypoglycemic drugs: Secondary | ICD-10-CM | POA: Diagnosis not present

## 2019-10-31 DIAGNOSIS — N938 Other specified abnormal uterine and vaginal bleeding: Secondary | ICD-10-CM | POA: Insufficient documentation

## 2019-10-31 DIAGNOSIS — E119 Type 2 diabetes mellitus without complications: Secondary | ICD-10-CM | POA: Diagnosis not present

## 2019-10-31 DIAGNOSIS — R102 Pelvic and perineal pain: Secondary | ICD-10-CM | POA: Diagnosis not present

## 2019-10-31 LAB — CBC WITH DIFFERENTIAL/PLATELET
Abs Immature Granulocytes: 0.02 10*3/uL (ref 0.00–0.07)
Basophils Absolute: 0 10*3/uL (ref 0.0–0.1)
Basophils Relative: 0 %
Eosinophils Absolute: 0.2 10*3/uL (ref 0.0–0.5)
Eosinophils Relative: 2 %
HCT: 38.9 % (ref 36.0–46.0)
Hemoglobin: 12.2 g/dL (ref 12.0–15.0)
Immature Granulocytes: 0 %
Lymphocytes Relative: 37 %
Lymphs Abs: 2.7 10*3/uL (ref 0.7–4.0)
MCH: 29.5 pg (ref 26.0–34.0)
MCHC: 31.4 g/dL (ref 30.0–36.0)
MCV: 94 fL (ref 80.0–100.0)
Monocytes Absolute: 0.6 10*3/uL (ref 0.1–1.0)
Monocytes Relative: 9 %
Neutro Abs: 3.7 10*3/uL (ref 1.7–7.7)
Neutrophils Relative %: 52 %
Platelets: 314 10*3/uL (ref 150–400)
RBC: 4.14 MIL/uL (ref 3.87–5.11)
RDW: 13.9 % (ref 11.5–15.5)
WBC: 7.2 10*3/uL (ref 4.0–10.5)
nRBC: 0 % (ref 0.0–0.2)

## 2019-10-31 LAB — BASIC METABOLIC PANEL
Anion gap: 6 (ref 5–15)
BUN: 11 mg/dL (ref 6–20)
CO2: 23 mmol/L (ref 22–32)
Calcium: 8.7 mg/dL — ABNORMAL LOW (ref 8.9–10.3)
Chloride: 108 mmol/L (ref 98–111)
Creatinine, Ser: 0.63 mg/dL (ref 0.44–1.00)
GFR calc Af Amer: >60 mL/min (ref >60)
GFR calc non Af Amer: >60 mL/min (ref >60)
Glucose, Bld: 97 mg/dL (ref 70–99)
Potassium: 3.8 mmol/L (ref 3.5–5.1)
Sodium: 137 mmol/L (ref 135–145)

## 2019-10-31 LAB — URINALYSIS, ROUTINE W REFLEX MICROSCOPIC
Bilirubin Urine: NEGATIVE
Glucose, UA: NEGATIVE mg/dL
Hgb urine dipstick: NEGATIVE
Ketones, ur: NEGATIVE mg/dL
Leukocytes,Ua: NEGATIVE
Nitrite: NEGATIVE
Protein, ur: NEGATIVE mg/dL
Specific Gravity, Urine: 1.02 (ref 1.005–1.030)
pH: 6 (ref 5.0–8.0)

## 2019-10-31 LAB — WET PREP, GENITAL
Clue Cells Wet Prep HPF POC: NONE SEEN
Sperm: NONE SEEN
Trich, Wet Prep: NONE SEEN
Yeast Wet Prep HPF POC: NONE SEEN

## 2019-10-31 LAB — I-STAT BETA HCG BLOOD, ED (MC, WL, AP ONLY): I-stat hCG, quantitative: <5 m[IU]/mL (ref <5)

## 2019-10-31 LAB — HIV ANTIBODY (ROUTINE TESTING W REFLEX): HIV Screen 4th Generation wRfx: NONREACTIVE

## 2019-10-31 LAB — RPR: RPR Ser Ql: NONREACTIVE

## 2019-10-31 MED ORDER — IBUPROFEN 800 MG PO TABS
800.0000 mg | ORAL_TABLET | Freq: Once | ORAL | Status: AC
  Administered 2019-10-31: 800 mg via ORAL
  Filled 2019-10-31: qty 1

## 2019-10-31 NOTE — Discharge Instructions (Signed)
Follow-up with gynecology.  If you develop severe vaginal bleeding, changing pad sooner than every hour, dizziness or lightheadedness please seek reevaluation emergency department.

## 2019-10-31 NOTE — ED Triage Notes (Signed)
Pt arrives POV from home with complaints of abd pain and  Dysfunctional menstrual bleeding, having a heavy flow every 2 weeks. Pt denies N/V/D. Pt denies fevers

## 2019-10-31 NOTE — ED Provider Notes (Signed)
[facility] DEPT Provider Note   CSN: [id number] Arrival date & time: 10/31/19  0740    History   Chief Complaint Chief Complaint  Patient presents with   Abdominal Pain   HPI Meredith York is a 43 y.o. female with past medical history significant for ovarian cysts, Tubal ligation, HTN, DM who presents for evaluation of vaginal bleeding. Has had intermittent vaginal bleeding every 2 weeks. Lower abd cramping. Has not taken anything for her pain. Pain located to lower pelvis. Changing pad every 2 hours when she has her cycle however she denies current vaginal bleeding or discharge. States she is "supposed to start and I wanted to get ahead of the bleeding." She is not followed by Obgyn currently. Denies prior hx of cervical ca or prior endometrial thickening. Has been having abnormal cycles for 4 months. Will currently get her cycles every 2 weeks and last 5-6 days. Denies fever, chills, N/V, dizziness, CP, SOB, unilateral weakness. Ambulatory without difficulty. Prior hx of tubal ligation with Dr. [facility] in 2017. Denies additional aggrivating or alleviating factors. Denies concern for STDs.   History obtained from patient and past medical records. No interpretor was used.     HPI  Past Medical History:  Diagnosis Date   Chlamydia    Hypertension    Migraines    Ovarian cyst    S/P lumbar discectomy 04/10/2015   Trichomonas    Type 2 diabetes mellitus ([place])    glyburide    Patient Active Problem List   Diagnosis Date Noted   Acute chorioamnionitis on placental pathology s/p cesarean section on 08/04/15 08/04/2016   Active labor 08/03/2016   Gestational diabetes mellitus, class B1 07/29/2016   Supervision of high-risk pregnancy Jul 28, 2016   S/P cesarean section 07-28-16   Neonatal death 07/28/2016   Advanced maternal age in multigravida 03/12/2016   DM (diabetes mellitus), type 2 ([place]) 06/25/2014   Hypertension  07/05/2011    Past Surgical History:  Procedure Laterality Date   BACK SURGERY     CESAREAN SECTION  12/27/2011   Procedure: CESAREAN SECTION;  Surgeon: Shelly Bombard, MD;  Location: [facility];  Service: Gynecology;  Laterality: N/A;  Primary cesarean section with delivery of baby girl at 26. Apgars 3/3/6   CESAREAN SECTION N/A 08/03/2016   Procedure: CESAREAN SECTION;  Surgeon: Osborne Oman, MD;  Location: [facility];  Service: Obstetrics;  Laterality: N/A;   CHOLECYSTECTOMY  02/2009   DILATION AND CURETTAGE OF UTERUS N/A 06/25/2014   Procedure: DILATATION AND CURETTAGE;  Surgeon: Donnamae Jude, MD;  Location: [facility];  Service: Gynecology;  Laterality: N/A;   LUMBAR LAMINECTOMY/DECOMPRESSION MICRODISCECTOMY Left 04/10/2015   Procedure: Left L5-S1 Microdiscectomy;  Surgeon: Marybelle Killings, MD;  Location: [facility];  Service: Orthopedics;  Laterality: Left;     OB History    Gravida  6   Para  4   Term  3   Preterm  1   AB  2   Living  3     SAB  1   TAB  1   Ectopic      Multiple  0   Live Births  4            Home Medications    Prior to Admission medications   Medication Sig Start Date End Date Taking? Authorizing Provider  acetaminophen (TYLENOL) 325 MG tablet Take 650 mg by mouth every 6 (six) hours as needed for mild pain or  headache.   Yes [provider]  ibuprofen (ADVIL) 200 MG tablet Take 800-1,000 mg by mouth every 6 (six) hours as needed for fever, headache or moderate pain.   Yes [provider]  ACCU-CHEK SMARTVIEW test strip U UTD QID 07/21/16   [provider]  enalapril (VASOTEC) 10 MG tablet Take 1 tablet (10 mg total) by mouth daily. Patient not taking: Reported on 10/31/2019 08/06/16   Bufford Lope, DO  ferrous sulfate 325 (65 FE) MG tablet Take 1 tablet (325 mg total) by mouth 3 (three) times daily with meals. Patient not taking: Reported on 09/14/2016 08/06/16   Tamala Julian, [place], CNM  ibuprofen (ADVIL,MOTRIN)  600 MG tablet Take 1 tablet (600 mg total) by mouth every 6 (six) hours. Patient not taking: Reported on 09/14/2016 08/06/16   Tamala Julian, [place], CNM  meloxicam (MOBIC) 15 MG tablet Take 1 tablet (15 mg total) by mouth daily. TAKE WITH MEALS Patient not taking: Reported on 10/31/2019 05/06/17   Street, [place], PA-C  metFORMIN (GLUCOPHAGE) 500 MG tablet Take 1 tablet (500 mg total) by mouth 2 (two) times daily with a meal. Patient not taking: Reported on 10/31/2019 09/14/16   Constant, Peggy, MD    Family History Family History  Problem Relation Age of Onset   Hypertension Maternal Aunt    Hypertension Paternal Aunt    Club foot Daughter    Hirschsprung's disease Daughter    Learning disabilities Daughter    Polydactyly Daughter    Anesthesia problems Neg Hx    Hypotension Neg Hx    Malignant hyperthermia Neg Hx    Pseudochol deficiency Neg Hx     Social History Social History   Tobacco Use   Smoking status: Former Smoker    Packs/day: 0.25    Years: 16.00    Pack years: 4.00    Types: Cigarettes    Quit date: 12/22/2015    Years since quitting: 3.8   Smokeless tobacco: Never Used  Substance Use Topics   Alcohol use: No    Alcohol/week: 2.0 standard drinks    Types: 1 Glasses of wine, 1 Shots of liquor per week    Comment: occ   Drug use: No    Types: Marijuana    Comment: occ     Allergies   Amoxicillin   Review of Systems Review of Systems  Constitutional: Negative.   HENT: Negative.   Respiratory: Negative.   Cardiovascular: Negative.   Gastrointestinal: Negative.   Genitourinary: Positive for pelvic pain and vaginal bleeding. Negative for decreased urine volume, difficulty urinating, dysuria, enuresis, flank pain, frequency, genital sores, hematuria, menstrual problem, urgency, vaginal discharge and vaginal pain.  Musculoskeletal: Negative.   Skin: Negative.   Neurological: Negative.   All other systems reviewed and are negative.    Physical  Exam Updated Vital Signs BP 140/80 (BP Location: Left Arm)    Pulse 77    Temp 98 F (36.7 C) (Oral)    Resp 16    Wt 85.3 kg    SpO2 100%    BMI 33.84 kg/m   Physical Exam Vitals signs and nursing note reviewed. Exam conducted with a chaperone present.  Constitutional:      General: She is not in acute distress.    Appearance: She is well-developed. She is not ill-appearing or toxic-appearing.  HENT:     Head: Normocephalic and atraumatic.     Mouth/Throat:     Mouth: Mucous membranes are moist.  Eyes:  Pupils: Pupils are equal, round, and reactive to light.  Neck:     Musculoskeletal: Normal range of motion.  Cardiovascular:     Rate and Rhythm: Normal rate.     Heart sounds: Normal heart sounds.  Pulmonary:     Effort: Pulmonary effort is normal. No respiratory distress.     Breath sounds: Normal breath sounds.  Abdominal:     General: Bowel sounds are normal. There is no distension.     Palpations: Abdomen is soft.     Tenderness: There is no abdominal tenderness. There is no right CVA tenderness, left CVA tenderness, guarding or rebound. Negative signs include Murphy's sign and McBurney's sign.     Hernia: No hernia is present. There is no hernia in the left inguinal area or right inguinal area.  Genitourinary:    General: Normal vulva.     Comments: Normal appearing external female genitalia without rashes or lesions, normal vaginal epithelium. Normal appearing cervix without discharge or petechiae. Dry epithelium to vaginal vault. Cervical os is closed. There is no bleeding noted at the os. No Odor. Bimanual: No CMT, nontender.  No palpable adnexal masses or tenderness. Uterus midline and not fixed. Rectovaginal exam was deferred.  No cystocele or rectocele noted. No pelvic lymphadenopathy noted. Wet prep was obtained.  Cultures for gonorrhea and chlamydia collected. Exam performed with chaperone in room. Musculoskeletal: Normal range of motion.  Lymphadenopathy:      Lower Body: No right inguinal adenopathy. No left inguinal adenopathy.  Skin:    General: Skin is warm and dry.  Neurological:     Mental Status: She is alert.    ED Treatments / Results  Labs (all labs ordered are listed, but only abnormal results are displayed) Labs Reviewed  WET PREP, GENITAL - Abnormal; Notable for the following components:      Result Value   WBC, Wet Prep HPF POC FEW (*)    All other components within normal limits  BASIC METABOLIC PANEL - Abnormal; Notable for the following components:   Calcium 8.7 (*)    All other components within normal limits  CBC WITH DIFFERENTIAL/PLATELET  URINALYSIS, ROUTINE W REFLEX MICROSCOPIC  RPR  HIV ANTIBODY (ROUTINE TESTING W REFLEX)  I-STAT BETA HCG BLOOD, ED ([facility], [facility], [facility] ONLY)  GC/CHLAMYDIA PROBE AMP ([place]) NOT AT [facility]    EKG None  Radiology US Transvaginal Non-ob  Result Date: 10/31/2019 CLINICAL DATA:  43 year old female with abnormal uterine bleeding. History of C-sections and D and C. EXAM: TRANSABDOMINAL AND TRANSVAGINAL ULTRASOUND OF PELVIS TECHNIQUE: Both transabdominal and transvaginal ultrasound examinations of the pelvis were performed. Transabdominal technique was performed for global imaging of the pelvis including uterus, ovaries, adnexal regions, and pelvic cul-de-sac. It was necessary to proceed with endovaginal exam following the transabdominal exam to visualize the endometrium and ovaries. COMPARISON:  None FINDINGS: Uterus Measurements: 10.3 x 5.4 x 5.9 cm = volume: 172 mL. The uterus is anteverted and retroflexed. The uterus demonstrates a heterogeneous echotexture which may represent underlying adenomyosis. Clinical correlation is recommended. Endometrium Thickness: 4 mm. No focal abnormality identified in the visualized endometrium. Right ovary Measurements: 2.2 x 1.0 x 2.4 cm = volume: 2.9 mL. Normal appearance/no adnexal mass. Left ovary Measurements: 2.7 x 1.7 x 2.9 cm = volume: 7.0 mL. There is a  corpus luteum in the left ovary. The ovaries otherwise unremarkable. Other findings No abnormal free fluid. IMPRESSION: 1. Slightly heterogeneous uterus. 2. Unremarkable endometrium and ovaries. Electronically Signed   By:  Anner Crete M.D.   On: 10/31/2019 09:35   US Pelvis Complete  Result Date: 10/31/2019 CLINICAL DATA:  43 year old female with abnormal uterine bleeding. History of C-sections and D and C. EXAM: TRANSABDOMINAL AND TRANSVAGINAL ULTRASOUND OF PELVIS TECHNIQUE: Both transabdominal and transvaginal ultrasound examinations of the pelvis were performed. Transabdominal technique was performed for global imaging of the pelvis including uterus, ovaries, adnexal regions, and pelvic cul-de-sac. It was necessary to proceed with endovaginal exam following the transabdominal exam to visualize the endometrium and ovaries. COMPARISON:  None FINDINGS: Uterus Measurements: 10.3 x 5.4 x 5.9 cm = volume: 172 mL. The uterus is anteverted and retroflexed. The uterus demonstrates a heterogeneous echotexture which may represent underlying adenomyosis. Clinical correlation is recommended. Endometrium Thickness: 4 mm. No focal abnormality identified in the visualized endometrium. Right ovary Measurements: 2.2 x 1.0 x 2.4 cm = volume: 2.9 mL. Normal appearance/no adnexal mass. Left ovary Measurements: 2.7 x 1.7 x 2.9 cm = volume: 7.0 mL. There is a corpus luteum in the left ovary. The ovaries otherwise unremarkable. Other findings No abnormal free fluid. IMPRESSION: 1. Slightly heterogeneous uterus. 2. Unremarkable endometrium and ovaries. Electronically Signed   By: Anner Crete M.D.   On: 10/31/2019 09:35    Procedures Procedures (including critical care time)  Medications Ordered in ED Medications  ibuprofen (ADVIL) tablet 800 mg (800 mg Oral Given 10/31/19 0931)    Initial Impression / Assessment and Plan / ED Course  I have reviewed the triage vital signs and the nursing notes.  Pertinent  labs & imaging results that were available during my care of the patient were reviewed by me and considered in my medical decision making (see chart for details).  43 year old presents for evaluation of vaginal bleeding. Afebrile, non septic, non ill appearing. Abnormal uternin bleeding with cycle every 2 weeks, lasting 5-6 days. Abdomen soft with mild tenderness over lower pelvis. Non surgical abdomen. No dizziness or lightheadedness. Not currently bleeding however "Wants to get ahead of this." Not followed by ObGyn. Plan for labs, [place], Pelvic and reevaluate.  Labs and imaging personally reviewed: CBC without leukocytosis. Hgb stable at 12.2 BMP without acute findings Wet prep with few WBC Pregnancy negative Urinalysis negative [place] without acute AP findings  0845: GU exam with dry epithelium to vaginal vault. No bleeding or discharge. No CMT or adnexal tenderness.  0955: Patient reevaluated, benign abdominal exam.  She is tolerating p.o. intake without difficulty.  Discussed with patient results of ultrasound findings.  Given she has no bleeding at this time.  Feel she be best to follow-up with OB/GYN outpatient.  Discussed strict return precautions.  Patient voiced understanding is agreeable follow-up.  Patient is nontoxic, nonseptic appearing, in no apparent distress.  Patient's pain and other symptoms adequately managed in emergency department.  Fluid bolus given.  Labs, imaging and vitals reviewed.  Patient does not meet the SIRS or Sepsis criteria.  On repeat exam patient does not have a surgical abdomin and there are no peritoneal signs.  No indication of appendicitis, bowel obstruction, bowel perforation, cholecystitis, diverticulitis, PID or ectopic pregnancy.  Patient discharged home with symptomatic treatment and given strict instructions for follow-up with their primary care physician.  I have also discussed reasons to return immediately to the ER.  Patient expresses understanding and  agrees with plan.        Final Clinical Impressions(s) / ED Diagnoses   Final diagnoses:  Abnormal uterine bleeding    ED Discharge Orders  Ordered    Ambulatory referral to Obstetrics / Gynecology    Comments: Dysfunctional uterine bleeding   10/31/19 0953           Cheyrl Buley A, PA-C 10/31/19 0957    Lennice Sites, DO 10/31/19 1025

## 2019-11-01 LAB — GC/CHLAMYDIA PROBE AMP (~~LOC~~) NOT AT ARMC
Chlamydia: NEGATIVE
Neisseria Gonorrhea: NEGATIVE

## 2019-11-29 ENCOUNTER — Encounter: Payer: Self-pay | Admitting: Obstetrics and Gynecology

## 2019-11-29 ENCOUNTER — Other Ambulatory Visit: Payer: Self-pay

## 2019-11-29 ENCOUNTER — Ambulatory Visit (INDEPENDENT_AMBULATORY_CARE_PROVIDER_SITE_OTHER): Payer: 59 | Admitting: Obstetrics and Gynecology

## 2019-11-29 DIAGNOSIS — Z124 Encounter for screening for malignant neoplasm of cervix: Secondary | ICD-10-CM | POA: Diagnosis not present

## 2019-11-29 DIAGNOSIS — Z1231 Encounter for screening mammogram for malignant neoplasm of breast: Secondary | ICD-10-CM | POA: Diagnosis not present

## 2019-11-29 DIAGNOSIS — N924 Excessive bleeding in the premenopausal period: Secondary | ICD-10-CM

## 2019-11-29 DIAGNOSIS — Z9851 Tubal ligation status: Secondary | ICD-10-CM | POA: Insufficient documentation

## 2019-11-29 DIAGNOSIS — Z01419 Encounter for gynecological examination (general) (routine) without abnormal findings: Secondary | ICD-10-CM | POA: Insufficient documentation

## 2019-11-29 DIAGNOSIS — Z1151 Encounter for screening for human papillomavirus (HPV): Secondary | ICD-10-CM | POA: Diagnosis not present

## 2019-11-29 DIAGNOSIS — Z01411 Encounter for gynecological examination (general) (routine) with abnormal findings: Secondary | ICD-10-CM | POA: Diagnosis not present

## 2019-11-29 MED ORDER — MEGESTROL ACETATE 40 MG PO TABS: 20.0000 mg | ORAL_TABLET | Freq: Every day | ORAL | 5 refills | Status: DC

## 2019-11-29 NOTE — Progress Notes (Signed)
Meredith York is a 44 y.o. [phone] female here for a routine annual gynecologic exam. She reports heavy painful cycles for the last 3 yrs, since her BTL. Cycles now last 7-9 day, changes pads q 1 hour on heavy days, has had accidents and missed worked. Occ she has 2 cycles in a month last time was in Sept.     Gynecologic History Patient's last menstrual period was 11/05/2019. Contraception: tubal ligation Last Pap: 2017. Results were: normal Last mammogram: uncertain. Results were: normal  Obstetric History OB History  Gravida Para Term Preterm AB Living  6 4 3 1 2 3   SAB TAB Ectopic Multiple Live Births  1 1   0 4    # Outcome Date GA Lbr Len/2nd Weight Sex Delivery Anes PTL Lv  6 Preterm 08/03/16 [redacted]w[redacted]d  7 lb 12.5 oz (3.53 kg) F CS-LTranv Spinal  LIV  5 SAB 07/02/14          4 Term 12/27/11 [redacted]w[redacted]d    CS-LTranv Spinal  ND  3 Term 04/03/07 [redacted]w[redacted]d  8 lb 15 oz (4.054 kg) F Vag-Spont EPI N LIV  2 Term 05/14/97 [redacted]w[redacted]d  7 lb 12 oz (3.515 kg) F Vag-Spont EPI N LIV  1 TAB             Past Medical History:  Diagnosis Date  . Chlamydia   . Hypertension   . Migraines   . Ovarian cyst   . S/P lumbar discectomy 04/10/2015  . Trichomonas   . Type 2 diabetes mellitus (HCC)    glyburide    Past Surgical History:  Procedure Laterality Date  . BACK SURGERY    . CESAREAN SECTION  12/27/2011   Procedure: CESAREAN SECTION;  Surgeon: Shelly Bombard, MD;  Location: [facility];  Service: Gynecology;  Laterality: N/A;  Primary cesarean section with delivery of baby girl at 63. Apgars 3/3/6  . CESAREAN SECTION N/A 08/03/2016   Procedure: CESAREAN SECTION;  Surgeon: Osborne Oman, MD;  Location: [facility];  Service: Obstetrics;  Laterality: N/A;  . CHOLECYSTECTOMY  02/2009  . DILATION AND CURETTAGE OF UTERUS N/A 06/25/2014   Procedure: DILATATION AND CURETTAGE;  Surgeon: Donnamae Jude, MD;  Location: [facility];  Service: Gynecology;  Laterality: N/A;  . LUMBAR LAMINECTOMY/DECOMPRESSION  MICRODISCECTOMY Left 04/10/2015   Procedure: Left L5-S1 Microdiscectomy;  Surgeon: Marybelle Killings, MD;  Location: [facility];  Service: Orthopedics;  Laterality: Left;    Current Outpatient Medications on File Prior to Visit  Medication Sig Dispense Refill  . acetaminophen (TYLENOL) 325 MG tablet Take 650 mg by mouth every 6 (six) hours as needed for mild pain or headache.    . ibuprofen (ADVIL) 200 MG tablet Take 800-1,000 mg by mouth every 6 (six) hours as needed for fever, headache or moderate pain.     No current facility-administered medications on file prior to visit.     Allergies  Allergen Reactions  . Amoxicillin Hives and Itching    Has patient had a PCN reaction causing immediate rash, facial/tongue/throat swelling, SOB or lightheadedness with hypotension: No Has patient had a PCN reaction causing severe rash involving mucus membranes or skin necrosis: No Has patient had a PCN reaction that required hospitalization No Has patient had a PCN reaction occurring within the last 10 years: No If all of the above answers are "NO", then may proceed with Cephalosporin use.     Social History   Socioeconomic History  . Marital status: Single  Spouse name: Not on file  . Number of children: Not on file  . Years of education: Not on file  . Highest education level: Not on file  Occupational History  . Not on file  Social Needs  . Financial resource strain: Not on file  . Food insecurity    Worry: Not on file    Inability: Not on file  . Transportation needs    Medical: Not on file    Non-medical: Not on file  Tobacco Use  . Smoking status: Former Smoker    Packs/day: 0.25    Years: 16.00    Pack years: 4.00    Types: Cigarettes    Quit date: 12/22/2015    Years since quitting: 3.9  . Smokeless tobacco: Never Used  Substance and Sexual Activity  . Alcohol use: No    Alcohol/week: 2.0 standard drinks    Types: 1 Glasses of wine, 1 Shots of liquor per week    Comment: occ   . Drug use: No    Types: Marijuana    Comment: occ  . Sexual activity: Not Currently    Birth control/protection: None, Abstinence  Lifestyle  . Physical activity    Days per week: Not on file    Minutes per session: Not on file  . Stress: Not on file  Relationships  . Social Herbalist on phone: Not on file    Gets together: Not on file    Attends religious service: Not on file    Active member of club or organization: Not on file    Attends meetings of clubs or organizations: Not on file    Relationship status: Not on file  . Intimate partner violence    Fear of current or ex partner: Not on file    Emotionally abused: Not on file    Physically abused: Not on file    Forced sexual activity: Not on file  Other Topics Concern  . Not on file  Social History Narrative  . Not on file    Family History  Problem Relation Age of Onset  . Hypertension Maternal Aunt   . Hypertension Paternal Aunt   . Club foot Daughter   . Hirschsprung's disease Daughter   . Learning disabilities Daughter   . Polydactyly Daughter   . Anesthesia problems Neg Hx   . Hypotension Neg Hx   . Malignant hyperthermia Neg Hx   . Pseudochol deficiency Neg Hx     The following portions of the patient's history were reviewed and updated as appropriate: allergies, current medications, past family history, past medical history, past social history, past surgical history and problem list.  Review of Systems Pertinent items noted in HPI and remainder of comprehensive ROS otherwise negative.   Objective:  BP (!) 149/91   Pulse 94   Wt 197 lb (89.4 kg)   LMP 11/05/2019   BMI 35.46 kg/m  CONSTITUTIONAL: Well-developed, well-nourished female in no acute distress.  HENT:  Normocephalic, atraumatic, External right and left ear normal. Oropharynx is clear and moist EYES: Conjunctivae and EOM are normal. Pupils are equal, round, and reactive to light. No scleral icterus.  NECK: Normal range of  motion, supple, no masses.  Normal thyroid.  SKIN: Skin is warm and dry. No rash noted. Not diaphoretic. No erythema. No pallor. [place]: Alert and oriented to person, place, and time. Normal reflexes, muscle tone coordination. No cranial nerve deficit noted. PSYCHIATRIC: Normal mood and affect. Normal behavior. Normal  judgment and thought content. CARDIOVASCULAR: Normal heart rate noted, regular rhythm RESPIRATORY: Clear to auscultation bilaterally. Effort and breath sounds normal, no problems with respiration noted. BREASTS: Deferred ABDOMEN: Soft, normal bowel sounds, no distention noted.  No tenderness, rebound or guarding.  PELVIC: Normal appearing external genitalia; normal appearing vaginal mucosa and cervix.  No abnormal discharge noted.  Pap smear obtained.  Normal uterine size, no other palpable masses, no uterine or adnexal tenderness. MUSCULOSKELETAL: Normal range of motion. No tenderness.  No cyanosis, clubbing, or edema.  2+ distal pulses.   Assessment:  Annual gynecologic examination with pap smear Menorraghia   Plan:  Will follow up results of pap smear and manage accordingly. Mammogram scheduled Labs for heavy cycles and GYN U/S Discussed with pt. Will start Megace qd for now. U/R/B reviewed with pt. Possible EMBX with next visit. Pt instructed to obtain a PCP d/t H/O HTN and DM in the past. F/U with me in 4 weeks Routine preventative health maintenance measures emphasized. Please refer to After Visit Summary for other counseling recommendations.    Chancy Milroy, MD, [place] Attending [place] for [facility], [place]

## 2019-11-29 NOTE — Patient Instructions (Signed)
Health Maintenance, Female Adopting a healthy lifestyle and getting preventive care are important in promoting health and wellness. Ask your health care provider about:  The right schedule for you to have regular tests and exams.  Things you can do on your own to prevent diseases and keep yourself healthy. What should I know about diet, weight, and exercise? Eat a healthy diet   Eat a diet that includes plenty of vegetables, fruits, low-fat dairy products, and lean protein.  Do not eat a lot of foods that are high in solid fats, added sugars, or sodium. Maintain a healthy weight Body mass index (BMI) is used to identify weight problems. It estimates body fat based on height and weight. Your health care provider can help determine your BMI and help you achieve or maintain a healthy weight. Get regular exercise Get regular exercise. This is one of the most important things you can do for your health. Most adults should:  Exercise for at least 150 minutes each week. The exercise should increase your heart rate and make you sweat (moderate-intensity exercise).  Do strengthening exercises at least twice a week. This is in addition to the moderate-intensity exercise.  Spend less time sitting. Even light physical activity can be beneficial. Watch cholesterol and blood lipids Have your blood tested for lipids and cholesterol at 42 years of age, then have this test every 5 years. Have your cholesterol levels checked more often if:  Your lipid or cholesterol levels are high.  You are older than 42 years of age.  You are at high risk for heart disease. What should I know about cancer screening? Depending on your health history and family history, you may need to have cancer screening at various ages. This may include screening for:  Breast cancer.  Cervical cancer.  Colorectal cancer.  Skin cancer.  Lung cancer. What should I know about heart disease, diabetes, and high blood  pressure? Blood pressure and heart disease  High blood pressure causes heart disease and increases the risk of stroke. This is more likely to develop in people who have high blood pressure readings, are of African descent, or are overweight.  Have your blood pressure checked: ? Every 3-5 years if you are 18-39 years of age. ? Every year if you are 40 years old or older. Diabetes Have regular diabetes screenings. This checks your fasting blood sugar level. Have the screening done:  Once every three years after age 40 if you are at a normal weight and have a low risk for diabetes.  More often and at a younger age if you are overweight or have a high risk for diabetes. What should I know about preventing infection? Hepatitis B If you have a higher risk for hepatitis B, you should be screened for this virus. Talk with your health care provider to find out if you are at risk for hepatitis B infection. Hepatitis C Testing is recommended for:  Everyone born from 1945 through 1965.  Anyone with known risk factors for hepatitis C. Sexually transmitted infections (STIs)  Get screened for STIs, including gonorrhea and chlamydia, if: ? You are sexually active and are younger than 42 years of age. ? You are older than 42 years of age and your health care provider tells you that you are at risk for this type of infection. ? Your sexual activity has changed since you were last screened, and you are at increased risk for chlamydia or gonorrhea. Ask your health care provider if   you are at risk.  Ask your health care provider about whether you are at high risk for HIV. Your health care provider may recommend a prescription medicine to help prevent HIV infection. If you choose to take medicine to prevent HIV, you should first get tested for HIV. You should then be tested every 3 months for as long as you are taking the medicine. Pregnancy  If you are about to stop having your period (premenopausal) and  you may become pregnant, seek counseling before you get pregnant.  Take 400 to 800 micrograms (mcg) of folic acid every day if you become pregnant.  Ask for birth control (contraception) if you want to prevent pregnancy. Osteoporosis and menopause Osteoporosis is a disease in which the bones lose minerals and strength with aging. This can result in bone fractures. If you are 65 years old or older, or if you are at risk for osteoporosis and fractures, ask your health care provider if you should:  Be screened for bone loss.  Take a calcium or vitamin D supplement to lower your risk of fractures.  Be given hormone replacement therapy (HRT) to treat symptoms of menopause. Follow these instructions at home: Lifestyle  Do not use any products that contain nicotine or tobacco, such as cigarettes, e-cigarettes, and chewing tobacco. If you need help quitting, ask your health care provider.  Do not use street drugs.  Do not share needles.  Ask your health care provider for help if you need support or information about quitting drugs. Alcohol use  Do not drink alcohol if: ? Your health care provider tells you not to drink. ? You are pregnant, may be pregnant, or are planning to become pregnant.  If you drink alcohol: ? Limit how much you use to 0-1 drink a day. ? Limit intake if you are breastfeeding.  Be aware of how much alcohol is in your drink. In the [place] one drink equals one 12 oz bottle of beer (355 mL), one 5 oz glass of wine (148 mL), or one 1 oz glass of hard liquor (44 mL). General instructions  Schedule regular health, dental, and eye exams.  Stay current with your vaccines.  Tell your health care provider if: ? You often feel depressed. ? You have ever been abused or do not feel safe at home. Summary  Adopting a healthy lifestyle and getting preventive care are important in promoting health and wellness.  Follow your health care provider's instructions about healthy  diet, exercising, and getting tested or screened for diseases.  Follow your health care provider's instructions on monitoring your cholesterol and blood pressure. This information is not intended to replace advice given to you by your health care provider. Make sure you discuss any questions you have with your health care provider. Document Released: 06/22/2011 Document Revised: 11/30/2018 Document Reviewed: 11/30/2018 Elsevier Patient Education  2020 Elsevier Inc.  

## 2019-11-30 ENCOUNTER — Inpatient Hospital Stay (INDEPENDENT_AMBULATORY_CARE_PROVIDER_SITE_OTHER): Payer: 59 | Admitting: Primary Care

## 2019-11-30 LAB — COMPREHENSIVE METABOLIC PANEL
ALT: 10 IU/L (ref 0–32)
AST: 11 IU/L (ref 0–40)
Albumin/Globulin Ratio: 1.8 (ref 1.2–2.2)
Albumin: 4.5 g/dL (ref 3.8–4.8)
Alkaline Phosphatase: 92 IU/L (ref 39–117)
BUN/Creatinine Ratio: 13 (ref 9–23)
BUN: 9 mg/dL (ref 6–24)
Bilirubin Total: 0.5 mg/dL (ref 0.0–1.2)
CO2: 21 mmol/L (ref 20–29)
Calcium: 9.6 mg/dL (ref 8.7–10.2)
Chloride: 103 mmol/L (ref 96–106)
Creatinine, Ser: 0.71 mg/dL (ref 0.57–1.00)
GFR calc Af Amer: 121 mL/min/{1.73_m2} (ref >59)
GFR calc non Af Amer: 105 mL/min/{1.73_m2} (ref >59)
Globulin, Total: 2.5 g/dL (ref 1.5–4.5)
Glucose: 95 mg/dL (ref 65–99)
Potassium: 3.8 mmol/L (ref 3.5–5.2)
Sodium: 137 mmol/L (ref 134–144)
Total Protein: 7 g/dL (ref 6.0–8.5)

## 2019-11-30 LAB — CBC
Hematocrit: 40.6 % (ref 34.0–46.6)
Hemoglobin: 13.6 g/dL (ref 11.1–15.9)
MCH: 30 pg (ref 26.6–33.0)
MCHC: 33.5 g/dL (ref 31.5–35.7)
MCV: 90 fL (ref 79–97)
Platelets: 301 10*3/uL (ref 150–450)
RBC: 4.53 x10E6/uL (ref 3.77–5.28)
RDW: 13 % (ref 11.7–15.4)
WBC: 7.9 10*3/uL (ref 3.4–10.8)

## 2019-11-30 LAB — HEMOGLOBIN A1C
Est. average glucose Bld gHb Est-mCnc: 117 mg/dL
Hgb A1c MFr Bld: 5.7 % — ABNORMAL HIGH (ref 4.8–5.6)

## 2019-11-30 LAB — TSH: TSH: 0.849 u[IU]/mL (ref 0.450–4.500)

## 2019-11-30 LAB — CYTOLOGY - PAP
Comment: NEGATIVE
Diagnosis: NEGATIVE
High risk HPV: NEGATIVE

## 2019-12-06 ENCOUNTER — Telehealth: Payer: Self-pay | Admitting: *Deleted

## 2019-12-06 ENCOUNTER — Ambulatory Visit
Admission: RE | Admit: 2019-12-06 | Discharge: 2019-12-06 | Disposition: A | Discharge status: Home / Self Care | Payer: 59 | Source: Ambulatory Visit | Attending: Obstetrics and Gynecology | Admitting: Obstetrics and Gynecology

## 2019-12-06 DIAGNOSIS — N924 Excessive bleeding in the premenopausal period: Secondary | ICD-10-CM

## 2019-12-06 NOTE — Telephone Encounter (Signed)
Received a call from [facility] today regarding pt u/s that is scheduled for today. Questioned wether or not Dr Rip Harbour would like for pt to have u/s today as she just had one in November. U/s tech states that u/s was normal.  Advised to proceed with u/s today as ordered due to recent visit and problem at that time would indicate need. U/s tech ask what provider is looking for as previous scan is normal. Advised looking at endometrium and ?need for Endo Bx.  U/s tech states she will leave it up to pt wether or not scan is done today. Advised that is fine, pt may choose what she wishes to do.

## 2019-12-26 ENCOUNTER — Encounter: Payer: Self-pay | Admitting: Nurse Practitioner

## 2019-12-26 ENCOUNTER — Ambulatory Visit (INDEPENDENT_AMBULATORY_CARE_PROVIDER_SITE_OTHER): Payer: 59 | Admitting: Nurse Practitioner

## 2019-12-26 ENCOUNTER — Other Ambulatory Visit: Payer: Self-pay

## 2019-12-26 VITALS — BP 134/72 | HR 99 | Temp 98.4°F | Ht 62.0 in | Wt 196.6 lb

## 2019-12-26 DIAGNOSIS — R7303 Prediabetes: Secondary | ICD-10-CM | POA: Diagnosis not present

## 2019-12-26 DIAGNOSIS — Z23 Encounter for immunization: Secondary | ICD-10-CM

## 2019-12-26 DIAGNOSIS — G4700 Insomnia, unspecified: Secondary | ICD-10-CM | POA: Diagnosis not present

## 2019-12-26 DIAGNOSIS — R519 Headache, unspecified: Secondary | ICD-10-CM | POA: Diagnosis not present

## 2019-12-26 MED ORDER — MAGNESIUM 250 MG PO TABS: 1.0000 | ORAL_TABLET | Freq: Every day | ORAL | 3 refills | Status: DC

## 2019-12-26 MED ORDER — KETOROLAC TROMETHAMINE 60 MG/2ML IM SOLN
60.0000 mg | Freq: Once | INTRAMUSCULAR | Status: AC
  Administered 2019-12-26: 60 mg via INTRAMUSCULAR

## 2019-12-26 NOTE — Progress Notes (Signed)
This visit occurred during the SARS-CoV-2 public health emergency.  Safety protocols were in place, including screening questions prior to the visit, additional usage of staff PPE, and extensive cleaning of exam room while observing appropriate contact time as indicated for disinfecting solutions.  Subjective:     Patient ID: Meredith York , female    DOB: 02/03/1976 , 44 y.o.   MRN: [id number]   Chief Complaint  Patient presents with  . Establish Care    HPI  Here to re-establish care she had been coming here about 4 years ago until she had her 66 year old daughter.  Has not seen a PCP since that.  She does a lot of walking at [employer].  Single. 3 daughters 4, 38, and 62.  She did lose one of her daughters after living for 3 days. She also had a D & C after a miscarriage.   PMH - 2 C -sections, cholecystectomy, low back surgery, tubal ligation.   HTN (during pregnancy) and gestational diabetes, migraines (10 years ago) she states she has lost approximately 50 lbs.     [facility] - Father - heart attack (deceased in 33's), Mother - HTN, DM.    She is being seen by an OB/GYN for irregular menstrual cycles - Dr. Rip Harbour has her Megestrol.    She is averaging approximately 50 oz water a day.  She drinks caffeine but has cut back with her headaches.  Would   Headache  This is a recurrent problem. The current episode started more than 1 month ago (initially started in March 2020). The problem occurs intermittently. The problem has been gradually worsening. The pain is located in the right unilateral (frontal) region. The pain does not radiate. The pain quality is similar to prior headaches. The quality of the pain is described as shooting. The pain is at a severity of 8/10. Pertinent negatives include no abdominal pain, blurred vision or dizziness. Exacerbated by: position change. Treatments tried: lay down, cool compress and darkness. Her past medical history is significant for  hypertension, migraine headaches and obesity. There is no history of cancer or sinus disease.     Past Medical History:  Diagnosis Date  . Chlamydia   . Hypertension   . Migraines   . Ovarian cyst   . S/P lumbar discectomy 04/10/2015  . Trichomonas   . Type 2 diabetes mellitus (HCC)    glyburide     Family History  Problem Relation Age of Onset  . Hypertension Maternal Aunt   . Hypertension Paternal Aunt   . Club foot Daughter   . Hirschsprung's disease Daughter   . Learning disabilities Daughter   . Polydactyly Daughter   . Hypertension Mother   . Diabetes Mother   . Heart disease Father   . Anesthesia problems Neg Hx   . Hypotension Neg Hx   . Malignant hyperthermia Neg Hx   . Pseudochol deficiency Neg Hx      Current Outpatient Medications:  .  acetaminophen (TYLENOL) 325 MG tablet, Take 650 mg by mouth every 6 (six) hours as needed for mild pain or headache., Disp: , Rfl:  .  ibuprofen (ADVIL) 200 MG tablet, Take 800-1,000 mg by mouth every 6 (six) hours as needed for fever, headache or moderate pain., Disp: , Rfl:  .  megestrol (MEGACE) 40 MG tablet, Take 0.5 tablets (20 mg total) by mouth daily. Can increase to two tablets twice a day in the event of heavy bleeding, Disp: 60  tablet, Rfl: 5   Allergies  Allergen Reactions  . Amoxicillin Hives and Itching    Has patient had a PCN reaction causing immediate rash, facial/tongue/throat swelling, SOB or lightheadedness with hypotension: No Has patient had a PCN reaction causing severe rash involving mucus membranes or skin necrosis: No Has patient had a PCN reaction that required hospitalization No Has patient had a PCN reaction occurring within the last 10 years: No If all of the above answers are "NO", then may proceed with Cephalosporin use.      Review of Systems  Constitutional: Negative for fatigue.  Eyes: Negative for blurred vision.  Respiratory: Negative.   Cardiovascular: Negative.  Negative for chest  pain, palpitations and leg swelling.  Gastrointestinal: Negative.  Negative for abdominal pain.  Musculoskeletal: Negative for arthralgias.  Neurological: Negative for dizziness and headaches.  Psychiatric/Behavioral: Negative.      Today's Vitals   12/26/19 1551  BP: 134/72  Pulse: 99  Temp: 98.4 F (36.9 C)  Weight: 196 lb 9.6 oz (89.2 kg)  Height: 5\' 2"  (1.575 m)   Body mass index is 35.96 kg/m.   Objective:  Physical Exam Constitutional:      Appearance: Normal appearance.  Cardiovascular:     Rate and Rhythm: Normal rate and regular rhythm.     Pulses: Normal pulses.     Heart sounds: No murmur.  Pulmonary:     Effort: Pulmonary effort is normal. No respiratory distress.     Breath sounds: Normal breath sounds.  Skin:    Capillary Refill: Capillary refill takes less than 2 seconds.  Neurological:     General: No focal deficit present.     Mental Status: She is alert and oriented to person, place, and time.     Cranial Nerves: No cranial nerve deficit.  Psychiatric:        Mood and Affect: Mood normal.        Behavior: Behavior normal.        Thought Content: Thought content normal.        Judgment: Judgment normal.         Assessment And Plan:     1. Need for influenza vaccination  Influenza vaccine given in office  Advised to take Tylenol as needed for muscle aches or fever - Flu Vaccine QUAD 6+ mos PF IM (Fluarix Quad PF)  2. Prediabetes  Chronic per patient  Will refer to eye provider for evaluation - Ambulatory referral to Ophthalmology  3. Intractable episodic headache, unspecified headache type  History of headaches currently having a persistent headache  Will treat with toradol in office  She is to take magnesium with evening meal to help decrease the frequency of headaches - Magnesium 250 MG TABS; Take 1 tablet (250 mg total) by mouth daily.  Dispense: 30 tablet; Refill: 3 - ketorolac (TORADOL) injection 60 mg  4. Insomnia,  unspecified type  Will try magnesium with evening meals first before considering a sleep aid  Encouraged to limit intake of caffeine - Magnesium 250 MG TABS; Take 1 tablet (250 mg total) by mouth daily.  Dispense: 30 tablet; Refill: 3   Minette Brine, FNP    THE PATIENT IS ENCOURAGED TO PRACTICE SOCIAL DISTANCING DUE TO THE COVID-19 PANDEMIC.

## 2019-12-26 NOTE — Patient Instructions (Signed)
General Headache Without Cause A headache is pain or discomfort that is felt around the head or neck area. There are many causes and types of headaches. In some cases, the cause may not be found. Follow these instructions at home: Watch your condition for any changes. Let your doctor know about them. Take these steps to help with your condition: Managing pain      Take over-the-counter and prescription medicines only as told by your doctor.  Lie down in a dark, quiet room when you have a headache.  If told, put ice on your head and neck area: ? Put ice in a plastic bag. ? Place a towel between your skin and the bag. ? Leave the ice on for 20 minutes, 2-3 times per day.  If told, put heat on the affected area. Use the heat source that your doctor recommends, such as a moist heat pack or a heating pad. ? Place a towel between your skin and the heat source. ? Leave the heat on for 20-30 minutes. ? Remove the heat if your skin turns bright red. This is very important if you are unable to feel pain, heat, or cold. You may have a greater risk of getting burned.  Keep lights dim if bright lights bother you or make your headaches worse. Eating and drinking  Eat meals on a regular schedule.  If you drink alcohol: ? Limit how much you use to:  0-1 drink a day for women.  0-2 drinks a day for men. ? Be aware of how much alcohol is in your drink. In the [place] one drink equals one 12 oz bottle of beer (355 mL), one 5 oz glass of wine (148 mL), or one 1 oz glass of hard liquor (44 mL).  Stop drinking caffeine, or reduce how much caffeine you drink. General instructions   Keep a journal to find out if certain things bring on headaches. For example, write down: ? What you eat and drink. ? How much sleep you get. ? Any change to your diet or medicines.  Get a massage or try other ways to relax.  Limit stress.  Sit up straight. Do not tighten (tense) your muscles.  Do not use any  products that contain nicotine or tobacco. This includes cigarettes, e-cigarettes, and chewing tobacco. If you need help quitting, ask your doctor.  Exercise regularly as told by your doctor.  Get enough sleep. This often means 7-9 hours of sleep each night.  Keep all follow-up visits as told by your doctor. This is important. Contact a doctor if:  Your symptoms are not helped by medicine.  You have a headache that feels different than the other headaches.  You feel sick to your stomach (nauseous) or you throw up (vomit).  You have a fever. Get help right away if:  Your headache gets very bad quickly.  Your headache gets worse after a lot of physical activity.  You keep throwing up.  You have a stiff neck.  You have trouble seeing.  You have trouble speaking.  You have pain in the eye or ear.  Your muscles are weak or you lose muscle control.  You lose your balance or have trouble walking.  You feel like you will pass out (faint) or you pass out.  You are mixed up (confused).  You have a seizure. Summary  A headache is pain or discomfort that is felt around the head or neck area.  There are many causes and   types of headaches. In some cases, the cause may not be found.  Keep a journal to help find out what causes your headaches. Watch your condition for any changes. Let your doctor know about them.  Contact a doctor if you have a headache that is different from usual, or if your headache is not helped by medicine.  Get help right away if your headache gets very bad, you throw up, you have trouble seeing, you lose your balance, or you have a seizure. This information is not intended to replace advice given to you by your health care provider. Make sure you discuss any questions you have with your health care provider. Document Revised: 06/27/2018 Document Reviewed: 06/27/2018 Elsevier Patient Education  2020 [employer]  

## 2019-12-27 ENCOUNTER — Encounter: Payer: Self-pay | Admitting: Obstetrics and Gynecology

## 2019-12-27 ENCOUNTER — Other Ambulatory Visit: Payer: Self-pay

## 2019-12-27 ENCOUNTER — Ambulatory Visit (INDEPENDENT_AMBULATORY_CARE_PROVIDER_SITE_OTHER): Payer: 59 | Admitting: Obstetrics and Gynecology

## 2019-12-27 VITALS — BP 155/93 | HR 90 | Wt 198.6 lb

## 2019-12-27 DIAGNOSIS — N924 Excessive bleeding in the premenopausal period: Secondary | ICD-10-CM

## 2019-12-27 MED ORDER — IBUPROFEN 800 MG PO TABS: 800.0000 mg | ORAL_TABLET | Freq: Three times a day (TID) | ORAL | 3 refills | Status: DC | PRN

## 2019-12-27 NOTE — Progress Notes (Signed)
Patient ID: Meredith York, female   DOB: [birth year], 44 y.o.   MRN: [id number] Ms Digiacomo presents for follow up for menorrhagia and dysmenorrhea. She is currently taking Meagce 20 mg daily and is doing well with this.  Bleeding pattern has greatly improved. Some cramps however. U/S normal except for simple ovarian cyst Reviewed with pt. Has also see PCP  PE  AF VSS Lungs clear Heart RRR Abd soft + BS  A/P Menorrhagia        Dysmenorrhea  Doing well on Megace. Discussed other treatment options such as IUD. Pt desires to continue with daily Megace for now. Rx for Motrin for dysmenorrhea. F/U in 4 months or PRN

## 2019-12-27 NOTE — Progress Notes (Signed)
Pt is here for follow up visit for heavy cycles. Started Megace qd last month. [place] on 12/06/19, normal. Pt reports Megace has been working, she has only had very light spotting that started yesterday. Pt does report cramping and headaches currently.

## 2020-01-18 ENCOUNTER — Other Ambulatory Visit: Payer: Self-pay

## 2020-01-18 ENCOUNTER — Ambulatory Visit
Admission: RE | Admit: 2020-01-18 | Discharge: 2020-01-18 | Disposition: A | Discharge status: Home / Self Care | Payer: 59 | Source: Ambulatory Visit | Attending: Obstetrics and Gynecology | Admitting: Obstetrics and Gynecology

## 2020-01-18 DIAGNOSIS — Z1231 Encounter for screening mammogram for malignant neoplasm of breast: Secondary | ICD-10-CM

## 2020-02-07 ENCOUNTER — Ambulatory Visit: Payer: 59 | Admitting: Nurse Practitioner

## 2020-02-21 LAB — HM DIABETES EYE EXAM

## 2020-03-20 ENCOUNTER — Other Ambulatory Visit: Payer: Self-pay

## 2020-03-20 ENCOUNTER — Encounter: Payer: Self-pay | Admitting: Nurse Practitioner

## 2020-03-20 ENCOUNTER — Ambulatory Visit (INDEPENDENT_AMBULATORY_CARE_PROVIDER_SITE_OTHER): Payer: 59 | Admitting: Nurse Practitioner

## 2020-03-20 VITALS — BP 138/82 | HR 85 | Temp 98.9°F | Ht 62.0 in | Wt 204.2 lb

## 2020-03-20 DIAGNOSIS — R519 Headache, unspecified: Secondary | ICD-10-CM

## 2020-03-20 DIAGNOSIS — G4700 Insomnia, unspecified: Secondary | ICD-10-CM | POA: Diagnosis not present

## 2020-03-20 NOTE — Progress Notes (Signed)
This visit occurred during the SARS-CoV-2 public health emergency.  Safety protocols were in place, including screening questions prior to the visit, additional usage of staff PPE, and extensive cleaning of exam room while observing appropriate contact time as indicated for disinfecting solutions.  Subjective:     Patient ID: Meredith York , female    DOB: 25-Jul-1976 , 44 y.o.   MRN: [id number]   Chief Complaint  Patient presents with  . Headache    f/u    HPI  Here for follow up headache She has to see her GYN next week due to persistent menorrhagia.  Thought to be pre-menopause. She has had a cycle for the last month.    Headache  This is a chronic problem. The current episode started more than 1 year ago. The problem occurs intermittently. The problem has been unchanged. The pain is located in the frontal region. The pain does not radiate. The pain quality is not similar to prior headaches. The quality of the pain is described as aching. Associated symptoms include insomnia. Pertinent negatives include no abdominal pain or dizziness. Nothing aggravates the symptoms. Treatments tried: toradol helped at last visit and she is taking magnesium  The treatment provided no relief.  Insomnia Primary symptoms: no fragmented sleep.  Typical bedtime:  8-10 P.M. (she will wake up about 6 am).  PMH includes: associated symptoms present.     Past Medical History:  Diagnosis Date  . Chlamydia   . Hypertension   . Migraines   . Ovarian cyst   . S/P lumbar discectomy 04/10/2015  . Trichomonas   . Type 2 diabetes mellitus (HCC)    glyburide     Family History  Problem Relation Age of Onset  . Hypertension Maternal Aunt   . Hypertension Paternal Aunt   . Club foot Daughter   . Hirschsprung's disease Daughter   . Learning disabilities Daughter   . Polydactyly Daughter   . Hypertension Mother   . Diabetes Mother   . Heart disease Father   . Anesthesia problems Neg Hx   . Hypotension Neg  Hx   . Malignant hyperthermia Neg Hx   . Pseudochol deficiency Neg Hx      Current Outpatient Medications:  .  acetaminophen (TYLENOL) 325 MG tablet, Take 650 mg by mouth every 6 (six) hours as needed for mild pain or headache., Disp: , Rfl:  .  ibuprofen (ADVIL) 800 MG tablet, Take 1 tablet (800 mg total) by mouth 3 (three) times daily with meals as needed for headache or moderate pain., Disp: 30 tablet, Rfl: 3 .  Magnesium 250 MG TABS, Take 1 tablet (250 mg total) by mouth daily., Disp: 30 tablet, Rfl: 3 .  megestrol (MEGACE) 40 MG tablet, Take 0.5 tablets (20 mg total) by mouth daily. Can increase to two tablets twice a day in the event of heavy bleeding, Disp: 60 tablet, Rfl: 5   Allergies  Allergen Reactions  . Amoxicillin Hives and Itching    Has patient had a PCN reaction causing immediate rash, facial/tongue/throat swelling, SOB or lightheadedness with hypotension: No Has patient had a PCN reaction causing severe rash involving mucus membranes or skin necrosis: No Has patient had a PCN reaction that required hospitalization No Has patient had a PCN reaction occurring within the last 10 years: No If all of the above answers are "NO", then may proceed with Cephalosporin use.      Review of Systems  Constitutional: Negative.   Respiratory: Negative.  Cardiovascular: Negative.   Gastrointestinal: Negative for abdominal pain.  Neurological: Positive for headaches. Negative for dizziness.  Psychiatric/Behavioral: The patient has insomnia.      Today's Vitals   03/20/20 1101  BP: 138/82  Pulse: 85  Temp: 98.9 F (37.2 C)  TempSrc: Oral  SpO2: 96%  Weight: 204 lb 3.2 oz (92.6 kg)  Height: 5\' 2"  (1.575 m)   Body mass index is 37.35 kg/m.   Objective:  Physical Exam Constitutional:      General: She is not in acute distress.    Appearance: Normal appearance. She is obese.  Eyes:     Extraocular Movements: Extraocular movements intact.     Pupils: Pupils are equal,  round, and reactive to light.  Cardiovascular:     Rate and Rhythm: Normal rate and regular rhythm.     Pulses: Normal pulses.     Heart sounds: No murmur.  Pulmonary:     Effort: Pulmonary effort is normal. No respiratory distress.     Breath sounds: Normal breath sounds.  Skin:    Capillary Refill: Capillary refill takes less than 2 seconds.  Neurological:     General: No focal deficit present.     Mental Status: She is alert and oriented to person, place, and time.     Cranial Nerves: No cranial nerve deficit.  Psychiatric:        Mood and Affect: Mood normal. Mood is not anxious.        Behavior: Behavior normal.        Thought Content: Thought content normal.        Judgment: Judgment normal.         Assessment And Plan:      1. Intractable episodic headache, unspecified headache type  Magnesium is ineffective  Will provide with samples of ubrelvy   2. Insomnia, unspecified type  Headaches may be related to the insomnia  She is to take melatonin over the counter if not better we may need to try a prescription medication    Minette Brine, FNP    THE PATIENT IS ENCOURAGED TO PRACTICE SOCIAL DISTANCING DUE TO THE COVID-19 PANDEMIC.

## 2020-03-29 ENCOUNTER — Ambulatory Visit (INDEPENDENT_AMBULATORY_CARE_PROVIDER_SITE_OTHER): Payer: 59 | Admitting: Obstetrics and Gynecology

## 2020-03-29 ENCOUNTER — Encounter: Payer: Self-pay | Admitting: Obstetrics and Gynecology

## 2020-03-29 ENCOUNTER — Other Ambulatory Visit: Payer: Self-pay

## 2020-03-29 ENCOUNTER — Encounter: Payer: Self-pay | Admitting: *Deleted

## 2020-03-29 ENCOUNTER — Other Ambulatory Visit (HOSPITAL_COMMUNITY)
Admission: RE | Admit: 2020-03-29 | Discharge: 2020-03-29 | Disposition: A | Discharge status: Home / Self Care | Payer: 59 | Source: Ambulatory Visit | Attending: Obstetrics and Gynecology | Admitting: Obstetrics and Gynecology

## 2020-03-29 VITALS — BP 152/98 | HR 97 | Ht 62.5 in | Wt 202.0 lb

## 2020-03-29 DIAGNOSIS — N924 Excessive bleeding in the premenopausal period: Secondary | ICD-10-CM | POA: Diagnosis present

## 2020-03-29 MED ORDER — OXYCODONE-ACETAMINOPHEN 5-325 MG PO TABS: 1.0000 | ORAL_TABLET | ORAL | 0 refills | Status: DC | PRN

## 2020-03-29 MED ORDER — MEGESTROL ACETATE 40 MG PO TABS: ORAL_TABLET | ORAL | 3 refills | Status: DC

## 2020-03-29 NOTE — Progress Notes (Addendum)
Patient ID: Meredith York, female   DOB: 14-Apr-1976, 44 y.o.   MRN: [id number] [name] presents in F/U for her menorrhagia. She has had a normal U/S and labs . She was started on Megace daily and had been doing well with this until March. She has been bleeding to some degree every day since. Even with doubling up on the Megace.  BTL C/S x 2  TSVD x 2 (largest 8 # 15 oz) Pap 12/20 normal  ENDOMETRIAL BIOPSY     The indications for endometrial biopsy were reviewed.   Risks of the biopsy including cramping, bleeding, infection, uterine perforation, inadequate specimen and need for additional procedures  were discussed. The patient states she understands and agrees to undergo procedure today. Consent was signed. Time out was performed. Urine HCG was negative. During the pelvic exam, the cervix was prepped with Betadine. A single-toothed tenaculum was placed on the anterior lip of the cervix to stabilize it. The 3 mm pipelle was introduced into the endometrial cavity without difficulty to a depth of 8cm, and a moderate amount of tissue was obtained and sent to pathology. The instruments were removed from the patient's vagina. Minimal bleeding from the cervix was noted. The patient tolerated the procedure well. Routine post-procedure instructions were given to the patient.    Bimanual exam: uterus small < 10 weeks size, mobile, slightly tender, no adnexal masses.  A/P Menorrhagia  Discussed treatment options with pt including IUD, ablation and hysterectomy. Pt desires definite therapy. Will increase Megace to high dose taper. Percocet for pain. EMBX completed today. Discussed TVH/BS with pt. R/B/Post op care. Information provided as well. Await EMBX results. If normal will proceed with scheduling TVH/BS F/U with post op appr

## 2020-03-29 NOTE — Patient Instructions (Signed)
Vaginal Hysterectomy, Care After Refer to this sheet in the next few weeks. These instructions provide you with information about caring for yourself after your procedure. Your health care provider may also give you more specific instructions. Your treatment has been planned according to current medical practices, but problems sometimes occur. Call your health care provider if you have any problems or questions after your procedure. What can I expect after the procedure? After the procedure, it is common to have:  Pain.  Soreness and numbness in your incision areas.  Vaginal bleeding and discharge.  Constipation.  Temporary problems emptying the bladder.  Feelings of sadness or other emotions. Follow these instructions at home: Medicines  Take over-the-counter and prescription medicines only as told by your health care provider.  If you were prescribed an antibiotic medicine, take it as told by your health care provider. Do not stop taking the antibiotic even if you start to feel better.  Do not drive or operate heavy machinery while taking prescription pain medicine. Activity  Return to your normal activities as told by your health care provider. Ask your health care provider what activities are safe for you.  Get regular exercise as told by your health care provider. You may be told to take short walks every day and go farther each time.  Do not lift anything that is heavier than 10 lb (4.5 kg). General instructions   Do not put anything in your vagina for 6 weeks after your surgery or as told by your health care provider. This includes tampons and douches.  Do not have sex until your health care provider says you can.  Do not take baths, swim, or use a hot tub until your health care provider approves.  Drink enough fluid to keep your urine clear or pale yellow.  Do not drive for 24 hours if you were given a sedative.  Keep all follow-up visits as told by your health  care provider. This is important. Contact a health care provider if:  Your pain medicine is not helping.  You have a fever.  You have redness, swelling, or pain at your incision site.  You have blood, pus, or a bad-smelling discharge from your vagina.  You continue to have difficulty urinating. Get help right away if:  You have severe abdominal or back pain.  You have heavy bleeding from your vagina.  You have chest pain or shortness of breath. This information is not intended to replace advice given to you by your health care provider. Make sure you discuss any questions you have with your health care provider. Document Revised: 07/30/2016 Document Reviewed: 12/22/2015 Elsevier Patient Education  2020 [place]. Vaginal Hysterectomy  A vaginal hysterectomy is a procedure to remove all or part of the uterus through a small incision in the vagina. In this procedure, your health care provider may remove your entire uterus, including the lower end (cervix). You may need a vaginal hysterectomy to treat:  Uterine fibroids.  A condition that causes the lining of the uterus to grow in other areas (endometriosis).  Problems with pelvic support.  Cancer of the cervix, ovaries, uterus, or tissue that lines the uterus (endometrium).  Excessive (dysfunctional) uterine bleeding. When removing your uterus, your health care provider may also remove the organs that produce eggs (ovaries) and the tubes that carry eggs to your uterus (fallopian tubes). After a vaginal hysterectomy, you will no longer be able to have a baby. You will also no longer get your  menstrual period. Tell a health care provider about:  Any allergies you have.  All medicines you are taking, including vitamins, herbs, eye drops, creams, and over-the-counter medicines.  Any problems you or family members have had with anesthetic medicines.  Any blood disorders you have.  Any surgeries you have had.  Any medical  conditions you have.  Whether you are pregnant or may be pregnant. What are the risks? Generally, this is a safe procedure. However, problems may occur, including:  Bleeding.  Infection.  A blood clot that forms in your leg and travels to your lungs (pulmonary embolism).  Damage to surrounding organs.  Pain during sex. What happens before the procedure?  Ask your health care provider what organs will be removed during surgery.  Ask your health care provider about: ? Changing or stopping your regular medicines. This is especially important if you are taking diabetes medicines or blood thinners. ? Taking medicines such as aspirin and ibuprofen. These medicines can thin your blood. Do not take these medicines before your procedure if your health care provider instructs you not to.  Follow instructions from your health care provider about eating or drinking restrictions.  Do not use any tobacco products, such as cigarettes, chewing tobacco, and e-cigarettes. If you need help quitting, ask your health care provider.  Plan to have someone take you home after discharge from the hospital. What happens during the procedure?  To reduce your risk of infection: ? Your health care team will wash or sanitize their hands. ? Your skin will be washed with soap.  An IV tube will be inserted into one of your veins.  You may be given antibiotic medicine to help prevent infection.  You will be given one or more of the following: ? A medicine to help you relax (sedative). ? A medicine to numb the area (local anesthetic). ? A medicine to make you fall asleep (general anesthetic). ? A medicine that is injected into an area of your body to numb everything beyond the injection site (regional anesthetic).  Your surgeon will make an incision in your vagina.  Your surgeon will locate and remove all or part of your uterus.  Your ovaries and fallopian tubes may be removed at the same time.  The  incision will be closed with stitches (sutures) that dissolve over time. The procedure may vary among health care providers and hospitals. What happens after the procedure?  Your blood pressure, heart rate, breathing rate, and blood oxygen level will be monitored often until the medicines you were given have worn off.  You will be encouraged to get up and walk around after a few hours to help prevent complications.  You may have IV tubes in place for a few days.  You will be given pain medicine as needed.  Do not drive for 24 hours if you were given a sedative. This information is not intended to replace advice given to you by your health care provider. Make sure you discuss any questions you have with your health care provider. Document Revised: 07/30/2016 Document Reviewed: 12/22/2015 Elsevier Patient Education  [place]. Endometrial Biopsy, Care After This sheet gives you information about how to care for yourself after your procedure. Your health care provider may also give you more specific instructions. If you have problems or questions, contact your health care provider. What can I expect after the procedure? After the procedure, it is common to have:  Mild cramping.  A small amount of vaginal  bleeding for a few days. This is normal. Follow these instructions at home:   Take over-the-counter and prescription medicines only as told by your health care provider.  Do not douche, use tampons, or have sexual intercourse until your health care provider approves.  Return to your normal activities as told by your health care provider. Ask your health care provider what activities are safe for you.  Follow instructions from your health care provider about any activity restrictions, such as restrictions on strenuous exercise or heavy lifting. Contact a health care provider if:  You have heavy bleeding, or bleed for longer than 2 days after the procedure.  You have bad  smelling discharge from your vagina.  You have a fever or chills.  You have a burning sensation when urinating or you have difficulty urinating.  You have severe pain in your lower abdomen. Get help right away if:  You have severe cramps in your stomach or back.  You pass large blood clots.  Your bleeding increases.  You become weak or light-headed, or you pass out. Summary  After the procedure, it is common to have mild cramping and a small amount of vaginal bleeding for a few days.  Do not douche, use tampons, or have sexual intercourse until your health care provider approves.  Return to your normal activities as told by your health care provider. Ask your health care provider what activities are safe for you. This information is not intended to replace advice given to you by your health care provider. Make sure you discuss any questions you have with your health care provider. Document Revised: 11/19/2017 Document Reviewed: 12/23/2016 Elsevier Patient Education  2020 [place]. Endometrial Biopsy  Endometrial biopsy is a procedure in which a tissue sample is taken from inside the uterus. The sample is taken from the endometrium, which is the lining of the uterus. The tissue sample is then checked under a microscope to see if the tissue is normal or abnormal. This procedure helps to determine where you are in your menstrual cycle and how hormone levels are affecting the lining of the uterus. This procedure may also be used to evaluate uterine bleeding or to diagnose endometrial cancer, endometrial tuberculosis, polyps, or other inflammatory conditions. Tell a health care provider about:  Any allergies you have.  All medicines you are taking, including vitamins, herbs, eye drops, creams, and over-the-counter medicines.  Any problems you or family members have had with anesthetic medicines.  Any blood disorders you have.  Any surgeries you have had.  Any medical  conditions you have.  Whether you are pregnant or may be pregnant. What are the risks? Generally, this is a safe procedure. However, problems may occur, including:  Bleeding.  Pelvic infection.  Puncture of the wall of the uterus with the biopsy device (rare). What happens before the procedure?  Keep a record of your menstrual cycles as told by your health care provider. You may need to schedule your procedure for a specific time in your cycle.  You may want to bring a sanitary pad to wear after the procedure.  Ask your health care provider about: ? Changing or stopping your regular medicines. This is especially important if you are taking diabetes medicines or blood thinners. ? Taking medicines such as aspirin and ibuprofen. These medicines can thin your blood. Do not take these medicines before your procedure if your health care provider instructs you not to.  Plan to have someone take you home from the  hospital or clinic. What happens during the procedure?  To lower your risk of infection: ? Your health care team will wash or sanitize their hands.  You will lie on an exam table with your feet and legs supported as in a pelvic exam.  Your health care provider will insert an instrument (speculum) into your vagina to see your cervix.  Your cervix will be cleansed with an antiseptic solution.  A medicine (local anesthetic) will be used to numb the cervix.  A forceps instrument (tenaculum) will be used to hold your cervix steady for the biopsy.  A thin, rod-like instrument (uterine sound) will be inserted through your cervix to determine the length of your uterus and the location where the biopsy sample will be removed.  A thin, flexible tube (catheter) will be inserted through your cervix and into the uterus. The catheter will be used to collect the biopsy sample from your endometrial tissue.  The catheter and speculum will then be removed, and the tissue sample will be sent  to a lab for examination. What happens after the procedure?  You will rest in a recovery area until you are ready to go home.  You may have mild cramping and a small amount of vaginal bleeding. This is normal.  It is up to you to get the results of your procedure. Ask your health care provider, or the department that is doing the procedure, when your results will be ready. Summary  Endometrial biopsy is a procedure in which a tissue sample is taken from the endometrium, which is the lining of the uterus.  This procedure may help to diagnose menstrual cycle problems, abnormal bleeding, or other conditions affecting the endometrium.  Before the procedure, keep a record of your menstrual cycles as told by your health care provider.  The tissue sample that is removed will be checked under a microscope to see if it is normal or abnormal. This information is not intended to replace advice given to you by your health care provider. Make sure you discuss any questions you have with your health care provider. Document Revised: 11/19/2017 Document Reviewed: 12/23/2016 Elsevier Patient Education  [place].

## 2020-03-29 NOTE — Progress Notes (Signed)
GYN presents for AUB changing pads every hour, cramps 10/10, blood clots size of golf ball, nausea x 1.50 months.

## 2020-04-01 LAB — SURGICAL PATHOLOGY

## 2020-04-16 ENCOUNTER — Encounter: Payer: Self-pay | Admitting: Nurse Practitioner

## 2020-05-22 NOTE — Patient Instructions (Addendum)
DUE TO COVID-19 ONLY ONE VISITOR IS ALLOWED IN WAITING ROOM (VISITOR WILL HAVE A TEMPERATURE CHECK ON ARRIVAL AND MUST WEAR A FACE MASK THE ENTIRE TIME.)  ONCE YOU ARE ADMITTED TO YOUR PRIVATE ROOM, THE SAME ONE VISITOR IS ALLOWED TO VISIT DURING VISITING HOURS ONLY.  Your COVID swab testing is scheduled for Tuesday, June 04, 2020 at 2:30PM , You must self quarantine after your testing per handout given to you at the testing site.  ([facility] testing enter pre-surgical testing line)   Your procedure is scheduled on: Tuesday, June 04, 2020  Report to [facility] AT  11:00 A. M.   Call this number if you have problems the morning of surgery:  [phone].   OUR ADDRESS IS [address].  WE ARE LOCATED IN THE [place]                                   [facility].                                     REMEMBER:  DO NOT EAT FOOD AFTER MIDNIGHT .    MAY HAVE LIQUIDS UNTIL 10:00 AM DAY OF SURGERY  CLEAR LIQUID DIET  Foods Allowed                                                                     Foods Excluded  Water, Black Coffee and tea, regular and decaf                             liquids that you cannot  Plain Jell-O in any flavor  (No red)                                           see through such as: Fruit ices (not with fruit pulp)                                     milk, soups, orange juice  Iced Popsicles (No red)                                    All solid food Carbonated beverages, regular and diet                                    Apple juices Sports drinks like Gatorade (No red) Lightly seasoned clear broth or consume(fat free) Sugar, honey syrup  Sample Menu Breakfast                                Lunch  Supper Cranberry juice                    Beef broth                            Chicken broth Jell-O                                     Grape juice                            Apple juice Coffee or tea                        Jell-O                                      Popsicle                                                Coffee or tea                        Coffee or tea  BRUSH YOUR TEETH THE MORNING OF SURGERY.  TAKE THESE MEDICATIONS MORNING OF SURGERY WITH A SIP OF WATER:  NONE  DO NOT WEAR JEWERLY, MAKE UP, OR NAIL POLISH.  DO NOT WEAR LOTIONS, POWDERS, PERFUMES/COLOGNE OR DEODORANT.  DO NOT SHAVE FOR 24 HOURS PRIOR TO DAY OF SURGERY.  CONTACTS, GLASSES, OR DENTURES MAY NOT BE WORN TO SURGERY.                                    [facility] IS NOT RESPONSIBLE  FOR ANY BELONGINGS.          BRING ALL PRESCRIPTION MEDICATIONS WITH YOU THE DAY OF SURGERY IN ORIGINAL CONTAINERS                                                               [facility] Preparing for [facility] Before surgery, you can play an important role.  Because skin is not sterile, your skin needs to be as free of germs as possible.  You can reduce the number of germs on your skin by washing with CHG (chlorahexidine gluconate) soap before surgery.  CHG is an antiseptic cleaner which kills germs and bonds with the skin to continue killing germs even after washing. Please DO NOT use if you have an allergy to CHG or antibacterial soaps.  If your skin becomes reddened/irritated stop using the CHG and inform your nurse when you arrive at Short Stay. Do not shave (including legs and underarms) for at least 48 hours prior to the first CHG shower.  You may shave your face/neck.  Please follow these instructions carefully:  1.  Shower with CHG Soap the night before  surgery and the  morning of surgery.  2.  If you choose to wash your hair, wash your hair first as usual with your normal  shampoo.  3.  After you shampoo, rinse your hair and body thoroughly to remove the shampoo.                             4.  Use CHG as you would any other liquid soap.  You can apply chg  directly to the skin and wash.  Gently with a scrungie or clean washcloth.  5.  Apply the CHG Soap to your body ONLY FROM THE NECK DOWN.   Do   not use on face/ open                           Wound or open sores. Avoid contact with eyes, ears mouth and   genitals (private parts).                       Wash face,  Genitals (private parts) with your normal soap.             6.  Wash thoroughly, paying special attention to the area where your    surgery  will be performed.  7.  Thoroughly rinse your body with warm water from the neck down.  8.  DO NOT shower/wash with your normal soap after using and rinsing off the CHG Soap.                9.  Pat yourself dry with a clean towel.            10.  Wear clean pajamas.            11.  Place clean sheets on your bed the night of your first shower and do not  sleep with pets. Day of Surgery : Do not apply any lotions/deodorants the morning of surgery.  Please wear clean clothes to the hospital/surgery center.  FAILURE TO FOLLOW THESE INSTRUCTIONS MAY RESULT IN THE CANCELLATION OF YOUR SURGERY  PATIENT SIGNATURE_________________________________  NURSE SIGNATURE__________________________________  ________________________________________________________________________  WHAT IS A BLOOD TRANSFUSION? Blood Transfusion Information  A transfusion is the replacement of blood or some of its parts. Blood is made up of multiple cells which provide different functions.  Red blood cells carry oxygen and are used for blood loss replacement.  White blood cells fight against infection.  Platelets control bleeding.  Plasma helps clot blood.  Other blood products are available for specialized needs, such as hemophilia or other clotting disorders. BEFORE THE TRANSFUSION  Who gives blood for transfusions?   Healthy volunteers who are fully evaluated to make sure their blood is safe. This is blood bank blood. Transfusion therapy is the safest it has ever  been in the practice of medicine. Before blood is taken from a donor, a complete history is taken to make sure that person has no history of diseases nor engages in risky social behavior (examples are intravenous drug use or sexual activity with multiple partners). The donor's travel history is screened to minimize risk of transmitting infections, such as malaria. The donated blood is tested for signs of infectious diseases, such as HIV and hepatitis. The blood is then tested to be sure it is compatible with you in order to minimize the chance of a transfusion reaction. If you or a  relative donates blood, this is often done in anticipation of surgery and is not appropriate for emergency situations. It takes many days to process the donated blood. RISKS AND COMPLICATIONS Although transfusion therapy is very safe and saves many lives, the main dangers of transfusion include:   Getting an infectious disease.  Developing a transfusion reaction. This is an allergic reaction to something in the blood you were given. Every precaution is taken to prevent this. The decision to have a blood transfusion has been considered carefully by your caregiver before blood is given. Blood is not given unless the benefits outweigh the risks. AFTER THE TRANSFUSION  Right after receiving a blood transfusion, you will usually feel much better and more energetic. This is especially true if your red blood cells have gotten low (anemic). The transfusion raises the level of the red blood cells which carry oxygen, and this usually causes an energy increase.  The nurse administering the transfusion will monitor you carefully for complications. HOME CARE INSTRUCTIONS  No special instructions are needed after a transfusion. You may find your energy is better. Speak with your caregiver about any limitations on activity for underlying diseases you may have. SEEK MEDICAL CARE IF:   Your condition is not improving after your  transfusion.  You develop redness or irritation at the intravenous (IV) site. SEEK IMMEDIATE MEDICAL CARE IF:  Any of the following symptoms occur over the next 12 hours:  Shaking chills.  You have a temperature by mouth above 102 F (38.9 C), not controlled by medicine.  Chest, back, or muscle pain.  People around you feel you are not acting correctly or are confused.  Shortness of breath or difficulty breathing.  Dizziness and fainting.  You get a rash or develop hives.  You have a decrease in urine output.  Your urine turns a dark color or changes to pink, red, or brown. Any of the following symptoms occur over the next 10 days:  You have a temperature by mouth above 102 F (38.9 C), not controlled by medicine.  Shortness of breath.  Weakness after normal activity.  The white part of the eye turns yellow (jaundice).  You have a decrease in the amount of urine or are urinating less often.  Your urine turns a dark color or changes to pink, red, or brown. Document Released: 12/04/2000 Document Revised: 02/29/2012 Document Reviewed: 07/23/2008 [facility] Patient Information 2014 [place], [place].  _______________________________________________________________________

## 2020-05-24 NOTE — H&P (Signed)
Meredith York is an 44 y.[id number] female with heavy cycles and uterine fibroids refactory to medical therapy. She desires definitive therpay EMBX negative  H/O BTL H/O C sectio H/O TSVD x 2  Menstrual History: Menarche age: 44 No LMP recorded. (Menstrual status: Irregular Periods).    Past Medical History:  Diagnosis Date  . Chlamydia   . Hypertension   . Migraines   . Ovarian cyst   . S/P lumbar discectomy 04/10/2015  . Trichomonas   . Type 2 diabetes mellitus (HCC)    glyburide    Past Surgical History:  Procedure Laterality Date  . BACK SURGERY    . CESAREAN SECTION  12/27/2011   Procedure: CESAREAN SECTION;  Surgeon: Shelly Bombard, MD;  Location: [facility];  Service: Gynecology;  Laterality: N/A;  Primary cesarean section with delivery of baby girl at 7. Apgars 3/3/6  . CESAREAN SECTION N/A 08/03/2016   Procedure: CESAREAN SECTION;  Surgeon: Osborne Oman, MD;  Location: [facility];  Service: Obstetrics;  Laterality: N/A;  . CHOLECYSTECTOMY  02/2009  . DILATION AND CURETTAGE OF UTERUS N/A 06/25/2014   Procedure: DILATATION AND CURETTAGE;  Surgeon: Donnamae Jude, MD;  Location: [facility];  Service: Gynecology;  Laterality: N/A;  . LUMBAR LAMINECTOMY/DECOMPRESSION MICRODISCECTOMY Left 04/10/2015   Procedure: Left L5-S1 Microdiscectomy;  Surgeon: Marybelle Killings, MD;  Location: [facility];  Service: Orthopedics;  Laterality: Left;    Family History  Problem Relation Age of Onset  . Hypertension Maternal Aunt   . Hypertension Paternal Aunt   . Club foot Daughter   . Hirschsprung's disease Daughter   . Learning disabilities Daughter   . Polydactyly Daughter   . Hypertension Mother   . Diabetes Mother   . Heart disease Father   . Anesthesia problems Neg Hx   . Hypotension Neg Hx   . Malignant hyperthermia Neg Hx   . Pseudochol deficiency Neg Hx     Social History:  reports that she has been smoking cigarettes. She has a 4.00 pack-year smoking history. She has never  used smokeless tobacco. She reports current alcohol use of about 2.0 standard drinks of alcohol per week. She reports that she does not use drugs.  Allergies:  Allergies  Allergen Reactions  . Amoxicillin Hives and Itching    Has patient had a PCN reaction causing immediate rash, facial/tongue/throat swelling, SOB or lightheadedness with hypotension: No Has patient had a PCN reaction causing severe rash involving mucus membranes or skin necrosis: No Has patient had a PCN reaction that required hospitalization No Has patient had a PCN reaction occurring within the last 10 years: No If all of the above answers are "NO", then may proceed with Cephalosporin use.     No medications prior to admission.    Review of Systems  Constitutional: Negative.   Respiratory: Negative.   Cardiovascular: Negative.   Gastrointestinal: Negative.   Genitourinary: Negative.     There were no vitals taken for this visit. Physical Exam  Constitutional: She appears well-developed and well-nourished.  Cardiovascular: Normal rate and regular rhythm.  Respiratory: Effort normal and breath sounds normal.  GI: Soft. Bowel sounds are normal.  Genitourinary:    Genitourinary Comments: Nl EGBUS, uterus < 10 weeks size, mobile, no adnexal masses     No results found for this or any previous visit (from the past 24 hour(s)).  No results found.  Assessment/Plan: Menorrhagia Uterine fibroids  TVH with salpingectomy reviewed with pt. R/B/Post op care discussed. Pt  has verbalized understanding and desires to proceed.   Chancy Milroy 05/24/2020, 5:12 PM

## 2020-05-28 ENCOUNTER — Other Ambulatory Visit: Payer: Self-pay

## 2020-05-28 ENCOUNTER — Encounter (HOSPITAL_COMMUNITY)
Admission: RE | Admit: 2020-05-28 | Discharge: 2020-05-28 | Disposition: A | Discharge status: Home / Self Care | Payer: 59 | Source: Ambulatory Visit | Attending: Obstetrics and Gynecology | Admitting: Obstetrics and Gynecology

## 2020-05-28 ENCOUNTER — Encounter (HOSPITAL_COMMUNITY): Payer: Self-pay

## 2020-05-28 DIAGNOSIS — Z01818 Encounter for other preprocedural examination: Secondary | ICD-10-CM | POA: Insufficient documentation

## 2020-05-28 HISTORY — DX: Gestational diabetes mellitus in pregnancy, unspecified control: O24.419

## 2020-05-28 HISTORY — DX: Carpal tunnel syndrome, right upper limb: G56.01

## 2020-05-28 HISTORY — DX: Enthesopathy, unspecified: M77.9

## 2020-05-28 LAB — CBC
HCT: 44.2 % (ref 36.0–46.0)
Hemoglobin: 14.3 g/dL (ref 12.0–15.0)
MCH: 30 pg (ref 26.0–34.0)
MCHC: 32.4 g/dL (ref 30.0–36.0)
MCV: 92.7 fL (ref 80.0–100.0)
Platelets: 317 10*3/uL (ref 150–400)
RBC: 4.77 MIL/uL (ref 3.87–5.11)
RDW: 13.5 % (ref 11.5–15.5)
WBC: 6.8 10*3/uL (ref 4.0–10.5)
nRBC: 0 % (ref 0.0–0.2)

## 2020-05-28 LAB — BASIC METABOLIC PANEL
Anion gap: 9 (ref 5–15)
BUN: 9 mg/dL (ref 6–20)
CO2: 24 mmol/L (ref 22–32)
Calcium: 9.2 mg/dL (ref 8.9–10.3)
Chloride: 106 mmol/L (ref 98–111)
Creatinine, Ser: 0.6 mg/dL (ref 0.44–1.00)
GFR calc Af Amer: >60 mL/min (ref >60)
GFR calc non Af Amer: >60 mL/min (ref >60)
Glucose, Bld: 82 mg/dL (ref 70–99)
Potassium: 3.8 mmol/L (ref 3.5–5.1)
Sodium: 139 mmol/L (ref 135–145)

## 2020-05-28 LAB — HEMOGLOBIN A1C
Hgb A1c MFr Bld: 6.1 % — ABNORMAL HIGH (ref 4.8–5.6)
Mean Plasma Glucose: 128.37 mg/dL

## 2020-05-28 NOTE — Progress Notes (Signed)
COVID Vaccine Completed: NO Date COVID Vaccine completed:N/A COVID vaccine manufacturer: N/A  PCP - J. Laurance Flatten FNP Cardiologist - N/A  Chest x-ray - N/A EKG - N/A Stress Test - N/A ECHO - N/A Cardiac Cath - N/A  Sleep Study - N/A CPAP - N/A  Fasting Blood Sugar - N/A Checks Blood Sugar __N/A___ times a day  Blood Thinner Instructions:  N/A Aspirin Instructions: N/A Last Dose: N/A  Anesthesia review: N/A  Patient denies shortness of breath, fever, cough and chest pain at PAT appointment   Patient verbalized understanding of instructions that were given to them at the PAT appointment. Patient was also instructed that they will need to review over the PAT instructions again at home before surgery.

## 2020-05-29 LAB — ABO/RH: ABO/RH(D): O POS

## 2020-05-31 ENCOUNTER — Other Ambulatory Visit (HOSPITAL_COMMUNITY)
Admission: RE | Admit: 2020-05-31 | Discharge: 2020-05-31 | Disposition: A | Discharge status: Home / Self Care | Payer: 59 | Source: Ambulatory Visit | Attending: Obstetrics and Gynecology | Admitting: Obstetrics and Gynecology

## 2020-05-31 DIAGNOSIS — Z20822 Contact with and (suspected) exposure to covid-19: Secondary | ICD-10-CM | POA: Insufficient documentation

## 2020-05-31 DIAGNOSIS — Z01812 Encounter for preprocedural laboratory examination: Secondary | ICD-10-CM | POA: Diagnosis not present

## 2020-05-31 LAB — SARS CORONAVIRUS 2 (TAT 6-24 HRS): SARS Coronavirus 2: NEGATIVE

## 2020-06-04 ENCOUNTER — Observation Stay (HOSPITAL_BASED_OUTPATIENT_CLINIC_OR_DEPARTMENT_OTHER)
Admission: RE | Admit: 2020-06-04 | Discharge: 2020-06-05 | Disposition: A | Discharge status: Home / Self Care | Payer: 59 | Attending: Obstetrics and Gynecology | Admitting: Obstetrics and Gynecology

## 2020-06-04 ENCOUNTER — Encounter (HOSPITAL_BASED_OUTPATIENT_CLINIC_OR_DEPARTMENT_OTHER): Payer: Self-pay | Admitting: Obstetrics and Gynecology

## 2020-06-04 ENCOUNTER — Other Ambulatory Visit: Payer: Self-pay

## 2020-06-04 ENCOUNTER — Ambulatory Visit (HOSPITAL_BASED_OUTPATIENT_CLINIC_OR_DEPARTMENT_OTHER): Payer: 59 | Admitting: Physician Assistant

## 2020-06-04 ENCOUNTER — Encounter (HOSPITAL_BASED_OUTPATIENT_CLINIC_OR_DEPARTMENT_OTHER)
Admission: RE | Disposition: A | Discharge status: Home / Self Care | Payer: Self-pay | Source: Home / Self Care | Attending: Obstetrics and Gynecology

## 2020-06-04 ENCOUNTER — Ambulatory Visit (HOSPITAL_BASED_OUTPATIENT_CLINIC_OR_DEPARTMENT_OTHER): Payer: 59 | Admitting: Registered Nurse

## 2020-06-04 DIAGNOSIS — Z880 Allergy status to penicillin: Secondary | ICD-10-CM | POA: Diagnosis not present

## 2020-06-04 DIAGNOSIS — N921 Excessive and frequent menstruation with irregular cycle: Secondary | ICD-10-CM | POA: Insufficient documentation

## 2020-06-04 DIAGNOSIS — E119 Type 2 diabetes mellitus without complications: Secondary | ICD-10-CM | POA: Diagnosis not present

## 2020-06-04 DIAGNOSIS — Z9889 Other specified postprocedural states: Secondary | ICD-10-CM

## 2020-06-04 DIAGNOSIS — N72 Inflammatory disease of cervix uteri: Secondary | ICD-10-CM | POA: Diagnosis not present

## 2020-06-04 DIAGNOSIS — Z7984 Long term (current) use of oral hypoglycemic drugs: Secondary | ICD-10-CM | POA: Insufficient documentation

## 2020-06-04 DIAGNOSIS — F17210 Nicotine dependence, cigarettes, uncomplicated: Secondary | ICD-10-CM | POA: Diagnosis not present

## 2020-06-04 DIAGNOSIS — N924 Excessive bleeding in the premenopausal period: Secondary | ICD-10-CM | POA: Diagnosis not present

## 2020-06-04 DIAGNOSIS — D259 Leiomyoma of uterus, unspecified: Secondary | ICD-10-CM | POA: Diagnosis not present

## 2020-06-04 HISTORY — PX: VAGINAL HYSTERECTOMY: SHX2639

## 2020-06-04 LAB — TYPE AND SCREEN
ABO/RH(D): O POS
Antibody Screen: NEGATIVE

## 2020-06-04 LAB — GLUCOSE, CAPILLARY
Glucose-Capillary: 88 mg/dL (ref 70–99)
Glucose-Capillary: 166 mg/dL — ABNORMAL HIGH (ref 70–99)

## 2020-06-04 LAB — POCT PREGNANCY, URINE: Preg Test, Ur: NEGATIVE

## 2020-06-04 SURGERY — HYSTERECTOMY, VAGINAL
Anesthesia: General | Site: Vagina

## 2020-06-04 MED ORDER — SUGAMMADEX SODIUM 200 MG/2ML IV SOLN
INTRAVENOUS | Status: DC | PRN
  Administered 2020-06-04: 200 mg via INTRAVENOUS

## 2020-06-04 MED ORDER — OXYCODONE-ACETAMINOPHEN 5-325 MG PO TABS
ORAL_TABLET | ORAL | Status: AC
  Filled 2020-06-04: qty 2

## 2020-06-04 MED ORDER — OXYCODONE-ACETAMINOPHEN 5-325 MG PO TABS
2.0000 | ORAL_TABLET | ORAL | Status: DC | PRN
  Administered 2020-06-04 – 2020-06-05 (×3): 2 via ORAL

## 2020-06-04 MED ORDER — OXYCODONE HCL 5 MG/5ML PO SOLN: 5.0000 mg | Freq: Once | ORAL | Status: DC | PRN

## 2020-06-04 MED ORDER — KETOROLAC TROMETHAMINE 30 MG/ML IJ SOLN
INTRAMUSCULAR | Status: AC
  Filled 2020-06-04: qty 1

## 2020-06-04 MED ORDER — SIMETHICONE 80 MG PO CHEW
CHEWABLE_TABLET | ORAL | Status: AC
  Filled 2020-06-04: qty 1

## 2020-06-04 MED ORDER — ACETAMINOPHEN 500 MG PO TABS
ORAL_TABLET | ORAL | Status: AC
  Filled 2020-06-04: qty 2

## 2020-06-04 MED ORDER — MENTHOL 3 MG MT LOZG: 1.0000 | LOZENGE | OROMUCOSAL | Status: DC | PRN

## 2020-06-04 MED ORDER — LIDOCAINE 2% (20 MG/ML) 5 ML SYRINGE
INTRAMUSCULAR | Status: DC | PRN
  Administered 2020-06-04: 100 mg via INTRAVENOUS

## 2020-06-04 MED ORDER — HYDROMORPHONE HCL 1 MG/ML IJ SOLN
INTRAMUSCULAR | Status: AC
  Filled 2020-06-04: qty 1

## 2020-06-04 MED ORDER — HYDROMORPHONE HCL 1 MG/ML IJ SOLN
0.2500 mg | INTRAMUSCULAR | Status: DC | PRN
  Administered 2020-06-04 (×4): 0.5 mg via INTRAVENOUS

## 2020-06-04 MED ORDER — ONDANSETRON HCL 4 MG/2ML IJ SOLN
INTRAMUSCULAR | Status: AC
  Filled 2020-06-04: qty 2

## 2020-06-04 MED ORDER — OXYCODONE-ACETAMINOPHEN 5-325 MG PO TABS: 1.0000 | ORAL_TABLET | ORAL | Status: DC | PRN

## 2020-06-04 MED ORDER — FENTANYL CITRATE (PF) 100 MCG/2ML IJ SOLN
INTRAMUSCULAR | Status: AC
  Filled 2020-06-04: qty 2

## 2020-06-04 MED ORDER — METRONIDAZOLE IN NACL 5-0.79 MG/ML-% IV SOLN
500.0000 mg | INTRAVENOUS | Status: AC
  Administered 2020-06-04: 500 mg via INTRAVENOUS

## 2020-06-04 MED ORDER — ROCURONIUM BROMIDE 10 MG/ML (PF) SYRINGE
PREFILLED_SYRINGE | INTRAVENOUS | Status: AC
  Filled 2020-06-04: qty 10

## 2020-06-04 MED ORDER — LACTATED RINGERS IV SOLN
INTRAVENOUS | Status: DC
  Administered 2020-06-04: 125 mL/h via INTRAVENOUS

## 2020-06-04 MED ORDER — KETOROLAC TROMETHAMINE 30 MG/ML IJ SOLN
INTRAMUSCULAR | Status: DC | PRN
Start: 2020-06-04 — End: 2020-06-04
  Administered 2020-06-04: 30 mg via INTRAVENOUS

## 2020-06-04 MED ORDER — PROPOFOL 10 MG/ML IV BOLUS
INTRAVENOUS | Status: DC | PRN
  Administered 2020-06-04: 20 mg via INTRAVENOUS
  Administered 2020-06-04: 180 mg via INTRAVENOUS

## 2020-06-04 MED ORDER — ONDANSETRON HCL 4 MG PO TABS: 4.0000 mg | ORAL_TABLET | Freq: Four times a day (QID) | ORAL | Status: DC | PRN

## 2020-06-04 MED ORDER — PROMETHAZINE HCL 25 MG/ML IJ SOLN: 6.2500 mg | INTRAMUSCULAR | Status: DC | PRN

## 2020-06-04 MED ORDER — ONDANSETRON HCL 4 MG/2ML IJ SOLN: 4.0000 mg | Freq: Four times a day (QID) | INTRAMUSCULAR | Status: DC | PRN

## 2020-06-04 MED ORDER — ROCURONIUM BROMIDE 10 MG/ML (PF) SYRINGE
PREFILLED_SYRINGE | INTRAVENOUS | Status: DC | PRN
  Administered 2020-06-04: 60 mg via INTRAVENOUS

## 2020-06-04 MED ORDER — HYDROMORPHONE HCL 1 MG/ML IJ SOLN
1.0000 mg | INTRAMUSCULAR | Status: DC | PRN
  Administered 2020-06-04: 1 mg via INTRAVENOUS

## 2020-06-04 MED ORDER — SCOPOLAMINE 1 MG/3DAYS TD PT72
1.0000 | MEDICATED_PATCH | Freq: Once | TRANSDERMAL | Status: DC
  Administered 2020-06-04: 1.5 mg via TRANSDERMAL

## 2020-06-04 MED ORDER — SCOPOLAMINE 1 MG/3DAYS TD PT72
MEDICATED_PATCH | TRANSDERMAL | Status: AC
  Filled 2020-06-04: qty 1

## 2020-06-04 MED ORDER — IBUPROFEN 800 MG PO TABS
800.0000 mg | ORAL_TABLET | Freq: Three times a day (TID) | ORAL | Status: DC
  Administered 2020-06-05: 800 mg via ORAL

## 2020-06-04 MED ORDER — MIDAZOLAM HCL 5 MG/5ML IJ SOLN
INTRAMUSCULAR | Status: DC | PRN
  Administered 2020-06-04: 2 mg via INTRAVENOUS

## 2020-06-04 MED ORDER — MIDAZOLAM HCL 2 MG/2ML IJ SOLN
INTRAMUSCULAR | Status: AC
  Filled 2020-06-04: qty 2

## 2020-06-04 MED ORDER — MENTHOL 3 MG MT LOZG
LOZENGE | OROMUCOSAL | Status: AC
  Filled 2020-06-04: qty 9

## 2020-06-04 MED ORDER — METRONIDAZOLE IN NACL 5-0.79 MG/ML-% IV SOLN
INTRAVENOUS | Status: AC
  Filled 2020-06-04: qty 100

## 2020-06-04 MED ORDER — FENTANYL CITRATE (PF) 250 MCG/5ML IJ SOLN
INTRAMUSCULAR | Status: DC | PRN
  Administered 2020-06-04: 100 ug via INTRAVENOUS
  Administered 2020-06-04 (×3): 50 ug via INTRAVENOUS

## 2020-06-04 MED ORDER — SOD CITRATE-CITRIC ACID 500-334 MG/5ML PO SOLN: 30.0000 mL | ORAL | Status: DC

## 2020-06-04 MED ORDER — ZOLPIDEM TARTRATE 5 MG PO TABS
5.0000 mg | ORAL_TABLET | Freq: Every evening | ORAL | Status: DC | PRN
  Administered 2020-06-05: 5 mg via ORAL

## 2020-06-04 MED ORDER — KETOROLAC TROMETHAMINE 30 MG/ML IJ SOLN
30.0000 mg | Freq: Four times a day (QID) | INTRAMUSCULAR | Status: DC
  Administered 2020-06-04 – 2020-06-05 (×2): 30 mg via INTRAVENOUS

## 2020-06-04 MED ORDER — SIMETHICONE 80 MG PO CHEW
80.0000 mg | CHEWABLE_TABLET | Freq: Four times a day (QID) | ORAL | Status: DC | PRN
  Administered 2020-06-04: 80 mg via ORAL

## 2020-06-04 MED ORDER — FENTANYL CITRATE (PF) 100 MCG/2ML IJ SOLN
25.0000 ug | INTRAMUSCULAR | Status: DC | PRN
  Administered 2020-06-04 (×2): 50 ug via INTRAVENOUS

## 2020-06-04 MED ORDER — DEXAMETHASONE SODIUM PHOSPHATE 10 MG/ML IJ SOLN
INTRAMUSCULAR | Status: AC
  Filled 2020-06-04: qty 1

## 2020-06-04 MED ORDER — DEXAMETHASONE SODIUM PHOSPHATE 10 MG/ML IJ SOLN
INTRAMUSCULAR | Status: DC | PRN
  Administered 2020-06-04: 10 mg via INTRAVENOUS

## 2020-06-04 MED ORDER — ONDANSETRON HCL 4 MG/2ML IJ SOLN
INTRAMUSCULAR | Status: DC | PRN
  Administered 2020-06-04: 4 mg via INTRAVENOUS

## 2020-06-04 MED ORDER — GENTAMICIN SULFATE 40 MG/ML IJ SOLN
5.0000 mg/kg | INTRAVENOUS | Status: AC
  Administered 2020-06-04: 344.4 mg via INTRAVENOUS
  Filled 2020-06-04: qty 8.5

## 2020-06-04 MED ORDER — KETOROLAC TROMETHAMINE 15 MG/ML IJ SOLN: 15.0000 mg | INTRAMUSCULAR | Status: AC

## 2020-06-04 MED ORDER — FENTANYL CITRATE (PF) 250 MCG/5ML IJ SOLN
INTRAMUSCULAR | Status: AC
  Filled 2020-06-04: qty 5

## 2020-06-04 MED ORDER — LIDOCAINE 2% (20 MG/ML) 5 ML SYRINGE
INTRAMUSCULAR | Status: AC
  Filled 2020-06-04: qty 5

## 2020-06-04 MED ORDER — LACTATED RINGERS IV SOLN: INTRAVENOUS | Status: DC

## 2020-06-04 MED ORDER — ACETAMINOPHEN 500 MG PO TABS: 1000.0000 mg | ORAL_TABLET | ORAL | Status: AC

## 2020-06-04 MED ORDER — OXYCODONE HCL 5 MG PO TABS: 5.0000 mg | ORAL_TABLET | Freq: Once | ORAL | Status: DC | PRN

## 2020-06-04 SURGICAL SUPPLY — 29 items
BLADE SURG 10 STRL SS (BLADE) ×3 IMPLANT
BRIEF STRETCH FOR OB PAD XXL (UNDERPADS AND DIAPERS) ×1 IMPLANT
CANISTER SUCT 1200ML W/VALVE (MISCELLANEOUS) ×3 IMPLANT
CNTNR URN SCR LID CUP LEK RST (MISCELLANEOUS) IMPLANT
CONT SPEC 4OZ STRL OR WHT (MISCELLANEOUS)
DECANTER SPIKE VIAL GLASS SM (MISCELLANEOUS) IMPLANT
GAUZE 4X4 16PLY RFD (DISPOSABLE) ×3 IMPLANT
GLOVE BIO SURGEON STRL SZ7.5 (GLOVE) ×3 IMPLANT
GLOVE BIO SURGEON STRL SZ8 (GLOVE) ×3 IMPLANT
GLOVE BIOGEL PI IND STRL 6.5 (GLOVE) ×1 IMPLANT
GLOVE BIOGEL PI IND STRL 7.0 (GLOVE) ×2 IMPLANT
GLOVE BIOGEL PI INDICATOR 6.5 (GLOVE) ×2
GLOVE BIOGEL PI INDICATOR 7.0 (GLOVE) ×4
GOWN STRL REUS W/TWL LRG LVL3 (GOWN DISPOSABLE) ×9 IMPLANT
GOWN STRL REUS W/TWL XL LVL3 (GOWN DISPOSABLE) ×3 IMPLANT
HOLDER FOLEY CATH W/STRAP (MISCELLANEOUS) ×2 IMPLANT
NS IRRIG 1000ML POUR BTL (IV SOLUTION) ×3 IMPLANT
PACK VAGINAL WOMENS (CUSTOM PROCEDURE TRAY) ×3 IMPLANT
PAD OB MATERNITY 4.3X12.25 (PERSONAL CARE ITEMS) ×3 IMPLANT
PAD PREP 24X48 CUFFED NSTRL (MISCELLANEOUS) ×3 IMPLANT
SLEEVE SCD COMPRESS KNEE MED (MISCELLANEOUS) ×3 IMPLANT
SUT VIC AB 2-0 CT1 18 (SUTURE) ×3 IMPLANT
SUT VIC AB 2-0 CT1 27 (SUTURE)
SUT VIC AB 2-0 CT1 TAPERPNT 27 (SUTURE) IMPLANT
SUT VIC AB PLUS 45CM 1-MO-4 (SUTURE) ×10 IMPLANT
SUT VICRYL 0 ENDOLOOP (SUTURE) IMPLANT
SUT VICRYL 1 TIES 12X18 (SUTURE) ×3 IMPLANT
TOWEL OR 17X26 10 PK STRL BLUE (TOWEL DISPOSABLE) ×4 IMPLANT
TRAY FOLEY W/BAG SLVR 14FR LF (SET/KITS/TRAYS/PACK) ×3 IMPLANT

## 2020-06-04 NOTE — Anesthesia Preprocedure Evaluation (Addendum)
Anesthesia Evaluation  Patient identified by MRN, date of birth, ID band Patient awake    Reviewed: Allergy & Precautions, NPO status , Patient's Chart, lab work & pertinent test results  History of Anesthesia Complications Negative for: history of anesthetic complications  Airway Mallampati: II  TM Distance: >3 FB Neck ROM: Full    Dental no notable dental hx.    Pulmonary Current Smoker and Patient abstained from smoking.,    Pulmonary exam normal        Cardiovascular hypertension, Normal cardiovascular exam     Neuro/Psych  Headaches, negative psych ROS   GI/Hepatic negative GI ROS, Neg liver ROS,   Endo/Other  diabetes, Poorly Controlled, Type 2  Renal/GU negative Renal ROS  negative genitourinary   Musculoskeletal negative musculoskeletal ROS (+)   Abdominal   Peds  Hematology negative hematology ROS (+)   Anesthesia Other Findings Day of surgery medications reviewed with patient.  Reproductive/Obstetrics negative OB ROS                          Anesthesia Physical Anesthesia Plan  ASA: II  Anesthesia Plan: General   Post-op Pain Management:    Induction: Intravenous  PONV Risk Score and Plan: 4 or greater and Treatment may vary due to age or medical condition, Ondansetron, Dexamethasone, Midazolam and Scopolamine patch - Pre-op  Airway Management Planned: Oral ETT  Additional Equipment: None  Intra-op Plan:   Post-operative Plan: Extubation in OR  Informed Consent: I have reviewed the patients History and Physical, chart, labs and discussed the procedure including the risks, benefits and alternatives for the proposed anesthesia with the patient or authorized representative who has indicated his/her understanding and acceptance.     Dental advisory given  Plan Discussed with: CRNA  Anesthesia Plan Comments:        Anesthesia Quick Evaluation

## 2020-06-04 NOTE — Anesthesia Procedure Notes (Signed)
Procedure Name: Intubation Date/Time: 06/04/2020 1:01 PM Performed by: Talbot Grumbling, CRNA Pre-anesthesia Checklist: Patient identified, Emergency Drugs available, Suction available and Patient being monitored Patient Re-evaluated:Patient Re-evaluated prior to induction Oxygen Delivery Method: Circle system utilized Preoxygenation: Pre-oxygenation with 100% oxygen Induction Type: IV induction Ventilation: Mask ventilation without difficulty Laryngoscope Size: Mac and 3 Grade View: Grade II Tube type: Oral Tube size: 7.0 mm Number of attempts: 1 Airway Equipment and Method: Stylet Placement Confirmation: ETT inserted through vocal cords under direct vision,  positive ETCO2 and breath sounds checked- equal and bilateral Secured at: 21 cm Tube secured with: Tape Dental Injury: Teeth and Oropharynx as per pre-operative assessment

## 2020-06-04 NOTE — Transfer of Care (Signed)
Immediate Anesthesia Transfer of Care Note  Patient: Meredith York  Procedure(s) Performed: HYSTERECTOMY VAGINAL, MORCELLATION (N/A Vagina )  Patient Location: PACU  Anesthesia Type:General  Level of Consciousness: sedated  Airway & Oxygen Therapy: Patient Spontanous Breathing and Patient connected to face mask oxygen  Post-op Assessment: Report given to RN and Post -op Vital signs reviewed and stable  Post vital signs: Reviewed and stable  Last Vitals:  Vitals Value Taken Time  BP 171/94 06/04/20 1449  Temp    Pulse 99 06/04/20 1451  Resp 11 06/04/20 1451  SpO2 100 % 06/04/20 1451  Vitals shown include unvalidated device data.  Last Pain:  Vitals:   06/04/20 1142  TempSrc: Oral         Complications: No complications documented.

## 2020-06-04 NOTE — Interval H&P Note (Signed)
History and Physical Interval Note:  06/04/2020 12:32 PM  Meredith York  has presented today for surgery, with the diagnosis of Menorrhagia.  The various methods of treatment have been discussed with the patient and family. After consideration of risks, benefits and other options for treatment, the patient has consented to  Procedure(s): HYSTERECTOMY VAGINAL WITH SALPINGECTOMY (Bilateral) as a surgical intervention.  The patient's history has been reviewed, patient examined, no change in status, stable for surgery.  I have reviewed the patient's chart and labs.  Questions were answered to the patient's satisfaction.     Chancy Milroy

## 2020-06-04 NOTE — Anesthesia Postprocedure Evaluation (Signed)
Anesthesia Post Note  Patient: Meredith York  Procedure(s) Performed: HYSTERECTOMY VAGINAL, MORCELLATION (N/A Vagina )     Patient location during evaluation: PACU Anesthesia Type: General Level of consciousness: awake and alert Pain management: pain level controlled Vital Signs Assessment: post-procedure vital signs reviewed and stable Respiratory status: spontaneous breathing, nonlabored ventilation, respiratory function stable and patient connected to nasal cannula oxygen Cardiovascular status: blood pressure returned to baseline and stable Postop Assessment: no apparent nausea or vomiting Anesthetic complications: no   No complications documented.  Last Vitals:  Vitals:   06/04/20 1515 06/04/20 1530  BP: (!) 161/93 (!) 171/95  Pulse: 84 75  Resp: 16 (!) 22  Temp:    SpO2: 100% 100%    Last Pain:  Vitals:   06/04/20 1515  TempSrc:   PainSc: 10-Worst pain ever                 Vivia Rosenburg

## 2020-06-04 NOTE — Op Note (Signed)
[facility]ollie Eth PROCEDURE DATE: 06/04/2020  PREOPERATIVE DIAGNOSIS:  Symptomatic fibroids, menorrhagia POSTOPERATIVE DIAGNOSIS:  Symptomatic fibroids, menorrhagia SURGEON:   Arlina Robes, M.D. ASSISTANT: Aletha Halim, M.D. An experienced assistant was required given the standard of surgical care given the complexity of the case.  This assistant was needed for exposure, dissection, suctioning, retraction, and for overall help during the procedure. OPERATION:  Total Vaginal hysterectomy with morcellation ANESTHESIA:  General endotracheal.  INDICATIONS: The patient is a 44 y.o. [id number] with history of symptomatic uterine fibroids/menorrhagia. The patient made a decision to undergo definite surgical treatment. On the preoperative visit, the risks, benefits, indications, and alternatives of the procedure were reviewed with the patient.  On the day of surgery, the risks of surgery were again discussed with the patient including but not limited to: bleeding which may require transfusion or reoperation; infection which may require antibiotics; injury to bowel, bladder, ureters or other surrounding organs; need for additional procedures; thromboembolic phenomenon, incisional problems and other postoperative/anesthesia complications. Written informed consent was obtained.    OPERATIVE FINDINGS: A 10 week size uterus with normal tubes and ovaries bilaterally. Thick adhesive band anterior of the uterus to the anterior abd wall.  ESTIMATED BLOOD LOSS: 350 ml FLUIDS:  As recorded URINE OUTPUT:  As recorded SPECIMENS:  Uterus and cervix sent to pathology COMPLICATIONS:  None immediate.  DESCRIPTION OF PROCEDURE:  The patient received intravenous antibiotics and had sequential compression devices applied to her lower extremities while in the preoperative area.  She was then taken to the operating room where general anesthesia was administered and was found to be adequate.  She was placed in the dorsal lithotomy  position, and was prepped and draped in a sterile manner.  A Foley catheter was inserted into her bladder and attached to constant drainage. After an adequate timeout was performed, attention was turned to her pelvis.  A weighted speculum was then placed in the vagina, and t posterior lip of the cervix wwas grasped . The posterior cul de sac was entered sharply. Long weighted speculum was placed. The anterior lip of the cervix was grasped with tenaculum.  The anterior vaginal mucosa was sharply circumscribed and the bladder was dissected off the pubocervical fascia anteriorly without complication.  The anterior cul-de-sac was then entered sharply without difficulty and a retractor was placed.  Zeplin clamps were then used to clamp the uterosacral ligaments on either side.  They were then cut and ligated. Of note, all sutures used in this case were 0 Vicryl unless otherwise noted.   The cardinal ligaments were then clamped, cut and ligated. The uterine vessels and broad ligaments were then serially clamped with the Zeplin clamps, cut, and suture ligated on both sides.  There was noted to be some resistant to descent on the left side of the uterus. The uterus was then morcellated in a bivalve technique. This allowed the final pedicle on the right side to be fully visualized. A Zeplin clamped was placed. The right IP was cut and then tied with a free tie and in a Monon fashion. The left IP was visualized then and clamped, cut and ligated. The thick adhesive band was then clamped, cut and ligated.  The uterus was then delivered and sent top pathology.  After completion of the hysterectomy, all pedicles from the uterosacral ligament to the cornua were examined hemostasis was confirmed.  The vaginal cuff was then closed with a 2/0 Vicryl  in a figure eight fashion.  All instruments were then removed  from the pelvis  The patient tolerated the procedure well.  All instruments, needles, and sponge counts were correct x 2.  The patient was taken to the recovery room in stable condition.    Arlina Robes, MD, [place] Attending [place], [facility]

## 2020-06-05 ENCOUNTER — Encounter (HOSPITAL_BASED_OUTPATIENT_CLINIC_OR_DEPARTMENT_OTHER): Payer: Self-pay | Admitting: Obstetrics and Gynecology

## 2020-06-05 DIAGNOSIS — D259 Leiomyoma of uterus, unspecified: Secondary | ICD-10-CM | POA: Diagnosis not present

## 2020-06-05 LAB — BASIC METABOLIC PANEL
Anion gap: 11 (ref 5–15)
BUN: 9 mg/dL (ref 6–20)
CO2: 20 mmol/L — ABNORMAL LOW (ref 22–32)
Calcium: 9.4 mg/dL (ref 8.9–10.3)
Chloride: 106 mmol/L (ref 98–111)
Creatinine, Ser: 0.64 mg/dL (ref 0.44–1.00)
GFR calc Af Amer: >60 mL/min (ref >60)
GFR calc non Af Amer: >60 mL/min (ref >60)
Glucose, Bld: 129 mg/dL — ABNORMAL HIGH (ref 70–99)
Potassium: 4 mmol/L (ref 3.5–5.1)
Sodium: 137 mmol/L (ref 135–145)

## 2020-06-05 LAB — CBC
HCT: 40.4 % (ref 36.0–46.0)
Hemoglobin: 13.1 g/dL (ref 12.0–15.0)
MCH: 29.8 pg (ref 26.0–34.0)
MCHC: 32.4 g/dL (ref 30.0–36.0)
MCV: 91.8 fL (ref 80.0–100.0)
Platelets: 343 10*3/uL (ref 150–400)
RBC: 4.4 MIL/uL (ref 3.87–5.11)
RDW: 13.2 % (ref 11.5–15.5)
WBC: 14.6 10*3/uL — ABNORMAL HIGH (ref 4.0–10.5)
nRBC: 0 % (ref 0.0–0.2)

## 2020-06-05 MED ORDER — DIPHENHYDRAMINE HCL 25 MG PO CAPS
ORAL_CAPSULE | ORAL | Status: AC
  Filled 2020-06-05: qty 1

## 2020-06-05 MED ORDER — HYDROMORPHONE HCL 2 MG PO TABS
ORAL_TABLET | ORAL | Status: AC
  Filled 2020-06-05: qty 1

## 2020-06-05 MED ORDER — IBUPROFEN 800 MG PO TABS
ORAL_TABLET | ORAL | Status: AC
  Filled 2020-06-05: qty 1

## 2020-06-05 MED ORDER — HYDROCHLOROTHIAZIDE 25 MG PO TABS
25.0000 mg | ORAL_TABLET | Freq: Every day | ORAL | 1 refills | Status: DC
Start: 2020-06-05 — End: 2020-09-24

## 2020-06-05 MED ORDER — HYDROMORPHONE HCL 2 MG PO TABS: 2.0000 mg | ORAL_TABLET | ORAL | 0 refills | Status: DC | PRN

## 2020-06-05 MED ORDER — OXYCODONE-ACETAMINOPHEN 5-325 MG PO TABS
ORAL_TABLET | ORAL | Status: AC
  Filled 2020-06-05: qty 2

## 2020-06-05 MED ORDER — DIPHENHYDRAMINE HCL 25 MG PO CAPS
25.0000 mg | ORAL_CAPSULE | Freq: Four times a day (QID) | ORAL | Status: DC | PRN
  Administered 2020-06-05: 25 mg via ORAL

## 2020-06-05 MED ORDER — IBUPROFEN 800 MG PO TABS: 800.0000 mg | ORAL_TABLET | Freq: Three times a day (TID) | ORAL | 0 refills | Status: DC

## 2020-06-05 MED ORDER — HYDROMORPHONE HCL 2 MG PO TABS
2.0000 mg | ORAL_TABLET | ORAL | Status: DC | PRN
  Administered 2020-06-05 (×2): 2 mg via ORAL

## 2020-06-05 MED ORDER — ZOLPIDEM TARTRATE 5 MG PO TABS
ORAL_TABLET | ORAL | Status: AC
  Filled 2020-06-05: qty 1

## 2020-06-05 MED ORDER — KETOROLAC TROMETHAMINE 30 MG/ML IJ SOLN
INTRAMUSCULAR | Status: AC
  Filled 2020-06-05: qty 1

## 2020-06-05 NOTE — Discharge Summary (Signed)
Physician Discharge Summary  Patient ID: Meredith York MRN: [id number] DOB/AGE: 1976/08/17 44 y.o.  Admit date: 06/04/2020 Discharge date: 06/05/2020  Admission Diagnoses: Menorrhagia and uterine fibroids  Discharge Diagnoses:  Active Problems:   Post-operative state   Discharged Condition: good  Hospital Course: Meredith York was admitted with above Dx. She underwent TVH without problems. See OP note for additional information. Her post op course was unremarkable except for some elevated BP's. Pt has a H/O HTN but not taking any medications. She was started on HCTZ and instructed to follow up with her PCP for further management of her HTN. She progressed otherwise to ambulating, voiding, tolerating diet and good oral pain control. Felt amendable for discharge home. Discharge instructions, medications and follow up reviewed with pt. She verbalized understanding.   Consults: None  Significant Diagnostic Studies: labs  Treatments: surgery: TVH  Discharge Exam: Blood pressure (!) 150/93, pulse 70, temperature 98.9 F (37.2 C), resp. rate 18, height 5' 2.5" (1.588 m), weight 93.4 kg, SpO2 100 %.  Lungs clear Heart RRR Abd soft + BS GU no bleeding Ext non tender  Disposition: Discharge disposition: 01-Home or Self Care       Discharge Instructions    Call MD for:  difficulty breathing, headache or visual disturbances   Complete by: As directed    Call MD for:  extreme fatigue   Complete by: As directed    Call MD for:  hives   Complete by: As directed    Call MD for:  persistant dizziness or light-headedness   Complete by: As directed    Call MD for:  persistant nausea and vomiting   Complete by: As directed    Call MD for:  redness, tenderness, or signs of infection (pain, swelling, redness, odor or green/yellow discharge around incision site)   Complete by: As directed    Call MD for:  severe uncontrolled pain   Complete by: As directed    Call MD for:  temperature  >100.4   Complete by: As directed    Diet - low sodium heart healthy   Complete by: As directed    Increase activity slowly   Complete by: As directed    Sexual Activity Restrictions   Complete by: As directed    Pelvic rest x 4 weeks     Allergies as of 06/05/2020      Reactions   Amoxicillin Hives, Itching   Has patient had a PCN reaction causing immediate rash, facial/tongue/throat swelling, SOB or lightheadedness with hypotension: No Has patient had a PCN reaction causing severe rash involving mucus membranes or skin necrosis: No Has patient had a PCN reaction that required hospitalization No Has patient had a PCN reaction occurring within the last 10 years: No If all of the above answers are "NO", then may proceed with Cephalosporin use.   Percocet [oxycodone-acetaminophen] Itching      Medication List    STOP taking these medications   Magnesium 250 MG Tabs   megestrol 40 MG tablet Commonly known as: MEGACE   oxyCODONE-acetaminophen 5-325 MG tablet Commonly known as: PERCOCET/ROXICET     TAKE these medications   hydrochlorothiazide 25 MG tablet Commonly known as: HYDRODIURIL Take 1 tablet (25 mg total) by mouth daily.   HYDROmorphone 2 MG tablet Commonly known as: DILAUDID Take 1 tablet (2 mg total) by mouth every 4 (four) hours as needed for severe pain.   ibuprofen 800 MG tablet Commonly known as: ADVIL Take 1 tablet (800 mg  total) by mouth 3 (three) times daily. What changed:   when to take this  reasons to take this       [place]. Schedule an appointment as soon as possible for a visit in 4 week(s).   Why: Post op appt with Dr. Gardiner Fanti Contact information: [address] [id number] [phone]              Signed: Chancy Milroy 06/05/2020, 8:39 AM

## 2020-06-05 NOTE — Progress Notes (Signed)
Patient complaining of severe itching after taking the percocet. Stated this happens at home after taking the percocet. Doctor Rip Harbour called and new orders were given for benadryl 25mg  PO q 6hrs PRN and Dilaudid 2mg  PO q 4 hrs PRN. Percocet discontinued.

## 2020-06-05 NOTE — Discharge Instructions (Signed)
Vaginal Hysterectomy, Care After Refer to this sheet in the next few weeks. These instructions provide you with information about caring for yourself after your procedure. Your health care provider may also give you more specific instructions. Your treatment has been planned according to current medical practices, but problems sometimes occur. Call your health care provider if you have any problems or questions after your procedure. What can I expect after the procedure? After the procedure, it is common to have:  Pain.  Soreness and numbness in your incision areas.  Vaginal bleeding and discharge.  Constipation.  Temporary problems emptying the bladder.  Feelings of sadness or other emotions. Follow these instructions at home: Medicines  Take over-the-counter and prescription medicines only as told by your health care provider.  If you were prescribed an antibiotic medicine, take it as told by your health care provider. Do not stop taking the antibiotic even if you start to feel better.  Do not drive or operate heavy machinery while taking prescription pain medicine. Activity  Return to your normal activities as told by your health care provider. Ask your health care provider what activities are safe for you.  Get regular exercise as told by your health care provider. You may be told to take short walks every day and go farther each time.  Do not lift anything that is heavier than 10 lb (4.5 kg). General instructions   Do not put anything in your vagina for 6 weeks after your surgery or as told by your health care provider. This includes tampons and douches.  Do not have sex until your health care provider says you can.  Do not take baths, swim, or use a hot tub until your health care provider approves.  Drink enough fluid to keep your urine clear or pale yellow.  Do not drive for 24 hours if you were given a sedative.  Keep all follow-up visits as told by your health  care provider. This is important. Contact a health care provider if:  Your pain medicine is not helping.  You have a fever.  You have redness, swelling, or pain at your incision site.  You have blood, pus, or a bad-smelling discharge from your vagina.  You continue to have difficulty urinating. Get help right away if:  You have severe abdominal or back pain.  You have heavy bleeding from your vagina.  You have chest pain or shortness of breath. This information is not intended to replace advice given to you by your health care provider. Make sure you discuss any questions you have with your health care provider. Document Revised: 07/30/2016 Document Reviewed: 12/22/2015 Elsevier Patient Education  2020 [employer]  

## 2020-06-06 LAB — SURGICAL PATHOLOGY

## 2020-06-10 ENCOUNTER — Encounter: Payer: Self-pay | Admitting: Nurse Practitioner

## 2020-06-10 ENCOUNTER — Ambulatory Visit (INDEPENDENT_AMBULATORY_CARE_PROVIDER_SITE_OTHER): Payer: 59 | Admitting: Nurse Practitioner

## 2020-06-10 ENCOUNTER — Other Ambulatory Visit: Payer: Self-pay

## 2020-06-10 VITALS — BP 160/90 | HR 85 | Temp 98.1°F | Ht 63.2 in | Wt 207.0 lb

## 2020-06-10 DIAGNOSIS — R030 Elevated blood-pressure reading, without diagnosis of hypertension: Secondary | ICD-10-CM

## 2020-06-10 DIAGNOSIS — Z9889 Other specified postprocedural states: Secondary | ICD-10-CM

## 2020-06-10 DIAGNOSIS — R7303 Prediabetes: Secondary | ICD-10-CM

## 2020-06-10 NOTE — Progress Notes (Signed)
This visit occurred during the SARS-CoV-2 public health emergency.  Safety protocols were in place, including screening questions prior to the visit, additional usage of staff PPE, and extensive cleaning of exam room while observing appropriate contact time as indicated for disinfecting solutions.  Subjective:     Patient ID: Meredith York , female    DOB: 03/11/1976 , 44 y.o.   MRN: [id number]   Chief Complaint  Patient presents with  . Post-op Problem    patient stated she had a surgery last week and her blood pressure was high.     HPI  Here for evaluation after having a hysterectomy on 06/05/2020 during the surgery her blood pressure was elevated. She does report a history of elevated blood pressure while pregnant 3 years ago.  She took an antihypertensive during that time as well.    She was to start the HCTZ but has not started due to soreness to the vaginal area and is uncomfortable.  Denies headache, dizziness or blurred vision. She does report having anxiety after the loss of baby that was the cause of her elevated blood pressure.   Her readings at home rang. [phone]. She notices when she is laying down the blood pressure is better.   Wt Readings from Last 3 Encounters: 06/10/20 : 207 lb (93.9 kg) 06/04/20 : 205 lb 12.8 oz (93.4 kg) 05/28/20 : 210 lb (95.3 kg)  She is no longer taking Megace, was 180 lbs prior to taking.      Past Medical History:  Diagnosis Date  . Carpal tunnel syndrome on right   . Chlamydia   . Gestational diabetes mellitus   . Hypertension    no medicaqtions at this time  . Migraines   . Ovarian cyst   . S/P lumbar discectomy 04/10/2015  . Tendonitis    right wrist  . Trichomonas   . Type 2 diabetes mellitus (HCC)    not currently taking medication     Family History  Problem Relation Age of Onset  . Hypertension Maternal Aunt   . Hypertension Paternal Aunt   . Club foot Daughter   . Hirschsprung's disease Daughter   . Learning  disabilities Daughter   . Polydactyly Daughter   . Hypertension Mother   . Diabetes Mother   . Heart disease Father   . Anesthesia problems Neg Hx   . Hypotension Neg Hx   . Malignant hyperthermia Neg Hx   . Pseudochol deficiency Neg Hx      Current Outpatient Medications:  .  HYDROmorphone (DILAUDID) 2 MG tablet, Take 1 tablet (2 mg total) by mouth every 4 (four) hours as needed for severe pain., Disp: 30 tablet, Rfl: 0 .  ibuprofen (ADVIL) 800 MG tablet, Take 1 tablet (800 mg total) by mouth 3 (three) times daily., Disp: 30 tablet, Rfl: 0 .  hydrochlorothiazide (HYDRODIURIL) 25 MG tablet, Take 1 tablet (25 mg total) by mouth daily. (Patient not taking: Reported on 06/10/2020), Disp: 30 tablet, Rfl: 1   Allergies  Allergen Reactions  . Amoxicillin Hives and Itching    Has patient had a PCN reaction causing immediate rash, facial/tongue/throat swelling, SOB or lightheadedness with hypotension: No Has patient had a PCN reaction causing severe rash involving mucus membranes or skin necrosis: No Has patient had a PCN reaction that required hospitalization No Has patient had a PCN reaction occurring within the last 10 years: No If all of the above answers are "NO", then may proceed with Cephalosporin use.   Marland Kitchen  Percocet [Oxycodone-Acetaminophen] Itching     Review of Systems  Constitutional: Negative.   Respiratory: Negative.   Cardiovascular: Negative.  Negative for chest pain, palpitations and leg swelling.  Genitourinary:       She is sore to her vaginal area after her vaginal hysterectomy.    Neurological: Negative for dizziness and headaches.  Psychiatric/Behavioral: Negative.      Today's Vitals   06/10/20 0831  BP: (!) 160/90  Pulse: 85  Temp: 98.1 F (36.7 C)  TempSrc: Oral  Weight: 207 lb (93.9 kg)  Height: 5' 3.2" (1.605 m)  PainSc: 6    Body mass index is 36.44 kg/m.   Objective:  Physical Exam Vitals reviewed.  Constitutional:      Appearance: She is  well-developed.  HENT:     Head: Normocephalic and atraumatic.  Eyes:     Pupils: Pupils are equal, round, and reactive to light.  Cardiovascular:     Rate and Rhythm: Normal rate and regular rhythm.     Pulses: Normal pulses.     Heart sounds: Normal heart sounds. No murmur heard.   Pulmonary:     Effort: Pulmonary effort is normal.     Breath sounds: Normal breath sounds.  Musculoskeletal:        General: Normal range of motion.  Skin:    General: Skin is warm and dry.     Capillary Refill: Capillary refill takes less than 2 seconds.  Neurological:     General: No focal deficit present.     Mental Status: She is alert and oriented to person, place, and time.     Cranial Nerves: No cranial nerve deficit.  Psychiatric:        Mood and Affect: Mood normal.        Behavior: Behavior normal.        Thought Content: Thought content normal.        Judgment: Judgment normal.         Assessment And Plan:     1. Elevated blood pressure reading without diagnosis of hypertension  Blood pressure remains elevated   She has not taken her HCTZ due to vaginal pain  She is advised to start the medications   I have also encouraged her to avoid smoking cigarettes  2. Prediabetes  Chronic, elevated in June  She has had an increase in her weight due to taking Megace  3. Post-operative state  She recently had vaginal hysterectomy  She is to follow up with GYN in July 22   Encouraged to increase water intake and to stay active base on her restrictions.     Minette Brine, FNP    THE PATIENT IS ENCOURAGED TO PRACTICE SOCIAL DISTANCING DUE TO THE COVID-19 PANDEMIC.

## 2020-07-05 DIAGNOSIS — Z3493 Encounter for supervision of normal pregnancy, unspecified, third trimester: Secondary | ICD-10-CM

## 2020-07-09 ENCOUNTER — Ambulatory Visit: Payer: 59 | Admitting: Nurse Practitioner

## 2020-07-11 ENCOUNTER — Encounter: Payer: 59 | Admitting: Obstetrics and Gynecology

## 2020-07-16 ENCOUNTER — Other Ambulatory Visit: Payer: Self-pay

## 2020-07-16 ENCOUNTER — Encounter: Payer: Self-pay | Admitting: Obstetrics and Gynecology

## 2020-07-16 ENCOUNTER — Ambulatory Visit: Payer: 59 | Admitting: Obstetrics and Gynecology

## 2020-07-16 ENCOUNTER — Encounter: Payer: Self-pay | Admitting: Obstetrics

## 2020-07-16 VITALS — BP 158/109 | HR 87 | Wt 207.3 lb

## 2020-07-16 DIAGNOSIS — Z9889 Other specified postprocedural states: Secondary | ICD-10-CM

## 2020-07-16 NOTE — Progress Notes (Signed)
Meredith York presents for post op visit. S/P TVH on 06/04/20. She has no complaints today. Denies any bowel or bladder dysfunction. No pain. Seeing PCP for HTN  Pathology reviewed with pt.  PE AF BP 158/109 Lungs clear Heart RRR Abd soft + BS GU nl EGBUS cuff well healed no adnexal masses or tenderness  A/P Post OP visit        HTN  Return to nl ADL's as tolerates. Return to work note provided for 07/22/20 HTN management as per PCP. F/U in 1 yr or PRN

## 2020-07-16 NOTE — Patient Instructions (Signed)
Health Maintenance, Female Adopting a healthy lifestyle and getting preventive care are important in promoting health and wellness. Ask your health care provider about:  The right schedule for you to have regular tests and exams.  Things you can do on your own to prevent diseases and keep yourself healthy. What should I know about diet, weight, and exercise? Eat a healthy diet   Eat a diet that includes plenty of vegetables, fruits, low-fat dairy products, and lean protein.  Do not eat a lot of foods that are high in solid fats, added sugars, or sodium. Maintain a healthy weight Body mass index (BMI) is used to identify weight problems. It estimates body fat based on height and weight. Your health care provider can help determine your BMI and help you achieve or maintain a healthy weight. Get regular exercise Get regular exercise. This is one of the most important things you can do for your health. Most adults should:  Exercise for at least 150 minutes each week. The exercise should increase your heart rate and make you sweat (moderate-intensity exercise).  Do strengthening exercises at least twice a week. This is in addition to the moderate-intensity exercise.  Spend less time sitting. Even light physical activity can be beneficial. Watch cholesterol and blood lipids Have your blood tested for lipids and cholesterol at 43 years of age, then have this test every 5 years. Have your cholesterol levels checked more often if:  Your lipid or cholesterol levels are high.  You are older than 43 years of age.  You are at high risk for heart disease. What should I know about cancer screening? Depending on your health history and family history, you may need to have cancer screening at various ages. This may include screening for:  Breast cancer.  Cervical cancer.  Colorectal cancer.  Skin cancer.  Lung cancer. What should I know about heart disease, diabetes, and high blood  pressure? Blood pressure and heart disease  High blood pressure causes heart disease and increases the risk of stroke. This is more likely to develop in people who have high blood pressure readings, are of African descent, or are overweight.  Have your blood pressure checked: ? Every 3-5 years if you are 18-39 years of age. ? Every year if you are 40 years old or older. Diabetes Have regular diabetes screenings. This checks your fasting blood sugar level. Have the screening done:  Once every three years after age 40 if you are at a normal weight and have a low risk for diabetes.  More often and at a younger age if you are overweight or have a high risk for diabetes. What should I know about preventing infection? Hepatitis B If you have a higher risk for hepatitis B, you should be screened for this virus. Talk with your health care provider to find out if you are at risk for hepatitis B infection. Hepatitis C Testing is recommended for:  Everyone born from 1945 through 1965.  Anyone with known risk factors for hepatitis C. Sexually transmitted infections (STIs)  Get screened for STIs, including gonorrhea and chlamydia, if: ? You are sexually active and are younger than 43 years of age. ? You are older than 43 years of age and your health care provider tells you that you are at risk for this type of infection. ? Your sexual activity has changed since you were last screened, and you are at increased risk for chlamydia or gonorrhea. Ask your health care provider if   you are at risk.  Ask your health care provider about whether you are at high risk for HIV. Your health care provider may recommend a prescription medicine to help prevent HIV infection. If you choose to take medicine to prevent HIV, you should first get tested for HIV. You should then be tested every 3 months for as long as you are taking the medicine. Pregnancy  If you are about to stop having your period (premenopausal) and  you may become pregnant, seek counseling before you get pregnant.  Take 400 to 800 micrograms (mcg) of folic acid every day if you become pregnant.  Ask for birth control (contraception) if you want to prevent pregnancy. Osteoporosis and menopause Osteoporosis is a disease in which the bones lose minerals and strength with aging. This can result in bone fractures. If you are 65 years old or older, or if you are at risk for osteoporosis and fractures, ask your health care provider if you should:  Be screened for bone loss.  Take a calcium or vitamin D supplement to lower your risk of fractures.  Be given hormone replacement therapy (HRT) to treat symptoms of menopause. Follow these instructions at home: Lifestyle  Do not use any products that contain nicotine or tobacco, such as cigarettes, e-cigarettes, and chewing tobacco. If you need help quitting, ask your health care provider.  Do not use street drugs.  Do not share needles.  Ask your health care provider for help if you need support or information about quitting drugs. Alcohol use  Do not drink alcohol if: ? Your health care provider tells you not to drink. ? You are pregnant, may be pregnant, or are planning to become pregnant.  If you drink alcohol: ? Limit how much you use to 0-1 drink a day. ? Limit intake if you are breastfeeding.  Be aware of how much alcohol is in your drink. In the [place] one drink equals one 12 oz bottle of beer (355 mL), one 5 oz glass of wine (148 mL), or one 1 oz glass of hard liquor (44 mL). General instructions  Schedule regular health, dental, and eye exams.  Stay current with your vaccines.  Tell your health care provider if: ? You often feel depressed. ? You have ever been abused or do not feel safe at home. Summary  Adopting a healthy lifestyle and getting preventive care are important in promoting health and wellness.  Follow your health care provider's instructions about healthy  diet, exercising, and getting tested or screened for diseases.  Follow your health care provider's instructions on monitoring your cholesterol and blood pressure. This information is not intended to replace advice given to you by your health care provider. Make sure you discuss any questions you have with your health care provider. Document Revised: 11/30/2018 Document Reviewed: 11/30/2018 Elsevier Patient Education  2020 [employer] Inc[employer]  

## 2020-07-17 ENCOUNTER — Encounter (HOSPITAL_BASED_OUTPATIENT_CLINIC_OR_DEPARTMENT_OTHER): Payer: Self-pay | Admitting: *Deleted

## 2020-07-17 ENCOUNTER — Emergency Department (HOSPITAL_BASED_OUTPATIENT_CLINIC_OR_DEPARTMENT_OTHER): Payer: 59

## 2020-07-17 ENCOUNTER — Emergency Department (HOSPITAL_BASED_OUTPATIENT_CLINIC_OR_DEPARTMENT_OTHER)
Admission: EM | Admit: 2020-07-17 | Discharge: 2020-07-17 | Disposition: A | Discharge status: Home / Self Care | Payer: 59 | Attending: Emergency Medicine | Admitting: Emergency Medicine

## 2020-07-17 ENCOUNTER — Other Ambulatory Visit: Payer: Self-pay

## 2020-07-17 DIAGNOSIS — I10 Essential (primary) hypertension: Secondary | ICD-10-CM | POA: Diagnosis not present

## 2020-07-17 DIAGNOSIS — F17210 Nicotine dependence, cigarettes, uncomplicated: Secondary | ICD-10-CM | POA: Insufficient documentation

## 2020-07-17 DIAGNOSIS — E119 Type 2 diabetes mellitus without complications: Secondary | ICD-10-CM | POA: Diagnosis not present

## 2020-07-17 DIAGNOSIS — M79674 Pain in right toe(s): Secondary | ICD-10-CM | POA: Diagnosis not present

## 2020-07-17 DIAGNOSIS — Z79899 Other long term (current) drug therapy: Secondary | ICD-10-CM | POA: Insufficient documentation

## 2020-07-17 NOTE — ED Provider Notes (Signed)
[facility] EMERGENCY DEPARTMENT Provider Note   CSN: [id number] Arrival date & time: 07/17/20  7846     History Chief Complaint  Patient presents with  . Toe Injury    Meredith York is a 44 y.o. female.  HPI   45 year old female with a history of carpal tunnel syndrome, chlamydia, hypertension, migraines, ovarian cyst, tendinitis, trichomonas, diabetes, who presents emergency department today complaining of right fourth toe pain.  States symptoms started 2 days ago after she hit her toe on her toddler's bed.  Pain is constant and severe in nature.  She has had a limp due to the pain.  She went to urgent care but did not receive an x-ray.  She has been taking ibuprofen with mild relief.  Past Medical History:  Diagnosis Date  . Carpal tunnel syndrome on right   . Chlamydia   . Gestational diabetes mellitus   . Hypertension    no medicaqtions at this time  . Migraines   . Ovarian cyst   . S/P lumbar discectomy 04/10/2015  . Tendonitis    right wrist  . Trichomonas   . Type 2 diabetes mellitus ([place])    not currently taking medication    Patient Active Problem List   Diagnosis Date Noted  . Post-operative state 06/04/2020  . Visit for routine gyn exam 11/29/2019  . Encounter for screening mammogram for breast cancer 11/29/2019  . History of bilateral tubal ligation 11/29/2019  . S/P cesarean section 07/14/2016  . Hypertension 07/05/2011    Past Surgical History:  Procedure Laterality Date  . ABDOMINAL HYSTERECTOMY    . BACK SURGERY    . CESAREAN SECTION  12/27/2011   Procedure: CESAREAN SECTION;  Surgeon: Shelly Bombard, MD;  Location: [facility];  Service: Gynecology;  Laterality: N/A;  Primary cesarean section with delivery of baby girl at 99. Apgars 3/3/6  . CESAREAN SECTION N/A 08/03/2016   Procedure: CESAREAN SECTION;  Surgeon: Osborne Oman, MD;  Location: [facility];  Service: Obstetrics;  Laterality: N/A;  . CHOLECYSTECTOMY  02/2009  .  DILATION AND CURETTAGE OF UTERUS N/A 06/25/2014   Procedure: DILATATION AND CURETTAGE;  Surgeon: Donnamae Jude, MD;  Location: [facility];  Service: Gynecology;  Laterality: N/A;  . LUMBAR LAMINECTOMY/DECOMPRESSION MICRODISCECTOMY Left 04/10/2015   Procedure: Left L5-S1 Microdiscectomy;  Surgeon: Marybelle Killings, MD;  Location: [facility];  Service: Orthopedics;  Laterality: Left;  Marland Kitchen VAGINAL HYSTERECTOMY N/A 06/04/2020   Procedure: HYSTERECTOMY VAGINAL, MORCELLATION;  Surgeon: Chancy Milroy, MD;  Location: [facility];  Service: Gynecology;  Laterality: N/A;  . WISDOM TOOTH EXTRACTION       OB History    Gravida  6   Para  4   Term  3   Preterm  1   AB  2   Living  3     SAB  1   TAB  1   Ectopic      Multiple  0   Live Births  4           Family History  Problem Relation Age of Onset  . Hypertension Maternal Aunt   . Hypertension Paternal Aunt   . Club foot Daughter   . Hirschsprung's disease Daughter   . Learning disabilities Daughter   . Polydactyly Daughter   . Hypertension Mother   . Diabetes Mother   . Heart disease Father   . Anesthesia problems Neg Hx   . Hypotension Neg Hx   .  Malignant hyperthermia Neg Hx   . Pseudochol deficiency Neg Hx     Social History   Tobacco Use  . Smoking status: Current Every Day Smoker    Packs/day: 0.25    Years: 16.00    Pack years: 4.00    Types: Cigarettes  . Smokeless tobacco: Never Used  . Tobacco comment: she is smoking about 4 cigarettes a day.   Vaping Use  . Vaping Use: Never used  Substance Use Topics  . Alcohol use: Yes    Alcohol/week: 2.0 standard drinks    Types: 1 Glasses of wine, 1 Shots of liquor per week    Comment: occ  . Drug use: Yes    Types: Marijuana    Comment: occ    Home Medications Prior to Admission medications   Medication Sig Start Date End Date Taking? Authorizing Provider  hydrochlorothiazide (HYDRODIURIL) 25 MG tablet Take 1 tablet (25 mg total) by mouth  daily. Patient not taking: Reported on 06/10/2020 06/05/20   Chancy Milroy, MD  HYDROmorphone (DILAUDID) 2 MG tablet Take 1 tablet (2 mg total) by mouth every 4 (four) hours as needed for severe pain. Patient not taking: Reported on 07/16/2020 06/05/20   Chancy Milroy, MD  ibuprofen (ADVIL) 800 MG tablet Take 1 tablet (800 mg total) by mouth 3 (three) times daily. 06/05/20   Chancy Milroy, MD    Allergies    Amoxicillin and Percocet [oxycodone-acetaminophen]  Review of Systems   Review of Systems  Constitutional: Negative for fever.  Musculoskeletal:       Right toe pain  Skin: Negative for wound.  Neurological: Negative for numbness.    Physical Exam Updated Vital Signs BP (!) 161/116 (BP Location: Right Arm)   Pulse 93   Temp 99 F (37.2 C) (Oral)   Resp 18   Ht 5' 2.5" (1.588 m)   Wt (!) 94.8 kg   LMP  (LMP Unknown)   SpO2 98%   BMI 37.62 kg/m   Physical Exam Vitals and nursing note reviewed.  Constitutional:      General: She is not in acute distress.    Appearance: She is well-developed.  HENT:     Head: Normocephalic and atraumatic.  Eyes:     Conjunctiva/sclera: Conjunctivae normal.  Musculoskeletal:        General: Normal range of motion.     Cervical back: Neck supple.     Comments: TTP to the right 4th toe  Skin:    General: Skin is warm and dry.     Capillary Refill: Capillary refill takes less than 2 seconds.  Neurological:     Mental Status: She is alert.     ED Results / Procedures / Treatments   Labs (all labs ordered are listed, but only abnormal results are displayed) Labs Reviewed - No data to display  EKG None  Radiology DG Foot Complete Right  Result Date: 07/17/2020 CLINICAL DATA:  Hit foot 2 days ago.  Pain in 4th toe EXAM: RIGHT FOOT COMPLETE - 3+ VIEW COMPARISON:  None. FINDINGS: There is no evidence of fracture or dislocation. There is no evidence of arthropathy or other focal bone abnormality. Soft tissues are  unremarkable. IMPRESSION: Negative. Electronically Signed   By: Rolm Baptise M.D.   On: 07/17/2020 10:39    Procedures Procedures (including critical care time)  Medications Ordered in ED Medications - No data to display  ED Course  I have reviewed the triage vital signs and  the nursing notes.  Pertinent labs & imaging results that were available during my care of the patient were reviewed by me and considered in my medical decision making (see chart for details).    MDM Rules/Calculators/A&P                         44 year old female who presents emergency department today complaining of right fourth toe pain.  States symptoms started 2 days ago after she hit her toe on her toddler's bed.   Xray reviewed/interpreted - no acute traumatic injury  Placed in post op shoe for comport. RICE protocol and pain meds indicated. pcp f/u and return precautions. D/c in stable condition.   Final Clinical Impression(s) / ED Diagnoses Final diagnoses:  Pain of toe of right foot    Rx / DC Orders ED Discharge Orders    None       Rodney Booze, PA-C 07/17/20 [place], Adam, DO 07/17/20 1122

## 2020-07-17 NOTE — ED Triage Notes (Signed)
Reports she hit her toe on her child's bed 2 days ago. States she heard a pop. She went to urgent care but was not xrayed. C/o burning pain in toe and into foot

## 2020-07-17 NOTE — Discharge Instructions (Addendum)

## 2020-08-06 ENCOUNTER — Other Ambulatory Visit: Payer: Self-pay | Admitting: Obstetrics and Gynecology

## 2020-09-02 ENCOUNTER — Emergency Department (HOSPITAL_BASED_OUTPATIENT_CLINIC_OR_DEPARTMENT_OTHER)
Admission: EM | Admit: 2020-09-02 | Discharge: 2020-09-02 | Disposition: A | Discharge status: Home / Self Care | Payer: 59 | Attending: Emergency Medicine | Admitting: Emergency Medicine

## 2020-09-02 ENCOUNTER — Other Ambulatory Visit: Payer: Self-pay

## 2020-09-02 ENCOUNTER — Encounter (HOSPITAL_BASED_OUTPATIENT_CLINIC_OR_DEPARTMENT_OTHER): Payer: Self-pay

## 2020-09-02 DIAGNOSIS — Z5321 Procedure and treatment not carried out due to patient leaving prior to being seen by health care provider: Secondary | ICD-10-CM | POA: Diagnosis not present

## 2020-09-02 DIAGNOSIS — M5442 Lumbago with sciatica, left side: Secondary | ICD-10-CM | POA: Diagnosis present

## 2020-09-02 NOTE — ED Triage Notes (Signed)
Pt c/o lower back pain that radiates down left LE x 3 weeks-denies injury-NAD-steady gait

## 2020-09-03 ENCOUNTER — Encounter (HOSPITAL_BASED_OUTPATIENT_CLINIC_OR_DEPARTMENT_OTHER): Payer: Self-pay

## 2020-09-03 ENCOUNTER — Emergency Department (HOSPITAL_BASED_OUTPATIENT_CLINIC_OR_DEPARTMENT_OTHER)
Admission: EM | Admit: 2020-09-03 | Discharge: 2020-09-03 | Disposition: A | Discharge status: Home / Self Care | Payer: 59 | Attending: Emergency Medicine | Admitting: Emergency Medicine

## 2020-09-03 ENCOUNTER — Other Ambulatory Visit: Payer: Self-pay

## 2020-09-03 DIAGNOSIS — Z79899 Other long term (current) drug therapy: Secondary | ICD-10-CM | POA: Diagnosis not present

## 2020-09-03 DIAGNOSIS — M5442 Lumbago with sciatica, left side: Secondary | ICD-10-CM

## 2020-09-03 DIAGNOSIS — M545 Low back pain: Secondary | ICD-10-CM | POA: Diagnosis present

## 2020-09-03 DIAGNOSIS — E119 Type 2 diabetes mellitus without complications: Secondary | ICD-10-CM | POA: Insufficient documentation

## 2020-09-03 DIAGNOSIS — I10 Essential (primary) hypertension: Secondary | ICD-10-CM | POA: Insufficient documentation

## 2020-09-03 DIAGNOSIS — F1590 Other stimulant use, unspecified, uncomplicated: Secondary | ICD-10-CM | POA: Insufficient documentation

## 2020-09-03 DIAGNOSIS — F17210 Nicotine dependence, cigarettes, uncomplicated: Secondary | ICD-10-CM | POA: Insufficient documentation

## 2020-09-03 MED ORDER — CYCLOBENZAPRINE HCL 10 MG PO TABS
10.0000 mg | ORAL_TABLET | Freq: Two times a day (BID) | ORAL | 0 refills | Status: DC | PRN
Start: 2020-09-03 — End: 2020-10-10

## 2020-09-03 MED ORDER — KETOROLAC TROMETHAMINE 15 MG/ML IJ SOLN
15.0000 mg | Freq: Once | INTRAMUSCULAR | Status: AC
  Administered 2020-09-03: 15 mg via INTRAMUSCULAR
  Filled 2020-09-03: qty 1

## 2020-09-03 MED FILL — CYCLOBENZAPRINE HCL 10 MG T: 10 | 10 days supply | Qty: 20 | Fill #0

## 2020-09-03 NOTE — ED Notes (Signed)
Pt ambulatory with slow, steady gait to room

## 2020-09-03 NOTE — ED Triage Notes (Signed)
Pt arrives with c/o to left lower back with pain radiating in to left leg, reports chronic back pain.

## 2020-09-03 NOTE — ED Provider Notes (Signed)
[facility] EMERGENCY DEPARTMENT Provider Note   CSN: [id number] Arrival date & time: 09/03/20  7124     History Chief Complaint  Patient presents with  . Back Pain    Meredith York is a 44 y.o. female.   Back Pain Location:  Lumbar spine, gluteal region and sacro-iliac joint Quality:  Aching Radiates to:  L thigh Pain severity:  Moderate Onset quality:  Gradual Duration:  3 weeks Timing:  Intermittent Progression:  Waxing and waning Chronicity:  Recurrent Context comment:  No new injury Relieved by: walking. Worsened by:  Nothing Ineffective treatments: 800 ibuprofen. Associated symptoms: no abdominal pain, no bladder incontinence, no bowel incontinence, no chest pain, no dysuria, no fever, no headaches, no numbness and no weakness   Risk factors: no hx of cancer        Past Medical History:  Diagnosis Date  . Carpal tunnel syndrome on right   . Chlamydia   . Gestational diabetes mellitus   . Hypertension    no medicaqtions at this time  . Migraines   . Ovarian cyst   . S/P lumbar discectomy 04/10/2015  . Tendonitis    right wrist  . Trichomonas   . Type 2 diabetes mellitus ([facility])    not currently taking medication    Patient Active Problem List   Diagnosis Date Noted  . Post-operative state 06/04/2020  . Visit for routine gyn exam 11/29/2019  . Encounter for screening mammogram for breast cancer 11/29/2019  . History of bilateral tubal ligation 11/29/2019  . S/P cesarean section 07/14/2016  . Hypertension 07/05/2011    Past Surgical History:  Procedure Laterality Date  . ABDOMINAL HYSTERECTOMY    . BACK SURGERY    . CESAREAN SECTION  12/27/2011   Procedure: CESAREAN SECTION;  Surgeon: Shelly Bombard, MD;  Location: [facility];  Service: Gynecology;  Laterality: N/A;  Primary cesarean section with delivery of baby girl at 29. Apgars 3/3/6  . CESAREAN SECTION N/A 08/03/2016   Procedure: CESAREAN SECTION;  Surgeon: Osborne Oman, MD;   Location: [facility];  Service: Obstetrics;  Laterality: N/A;  . CHOLECYSTECTOMY  02/2009  . DILATION AND CURETTAGE OF UTERUS N/A 06/25/2014   Procedure: DILATATION AND CURETTAGE;  Surgeon: Donnamae Jude, MD;  Location: [facility];  Service: Gynecology;  Laterality: N/A;  . LUMBAR LAMINECTOMY/DECOMPRESSION MICRODISCECTOMY Left 04/10/2015   Procedure: Left L5-S1 Microdiscectomy;  Surgeon: Marybelle Killings, MD;  Location: [facility];  Service: Orthopedics;  Laterality: Left;  Marland Kitchen VAGINAL HYSTERECTOMY N/A 06/04/2020   Procedure: HYSTERECTOMY VAGINAL, MORCELLATION;  Surgeon: Chancy Milroy, MD;  Location: [facility];  Service: Gynecology;  Laterality: N/A;  . WISDOM TOOTH EXTRACTION       OB History    Gravida  6   Para  4   Term  3   Preterm  1   AB  2   Living  3     SAB  1   TAB  1   Ectopic      Multiple  0   Live Births  4           Family History  Problem Relation Age of Onset  . Hypertension Maternal Aunt   . Hypertension Paternal Aunt   . Club foot Daughter   . Hirschsprung's disease Daughter   . Learning disabilities Daughter   . Polydactyly Daughter   . Hypertension Mother   . Diabetes Mother   . Heart disease Father   .  Anesthesia problems Neg Hx   . Hypotension Neg Hx   . Malignant hyperthermia Neg Hx   . Pseudochol deficiency Neg Hx     Social History   Tobacco Use  . Smoking status: Current Every Day Smoker    Packs/day: 0.25    Years: 16.00    Pack years: 4.00    Types: Cigarettes  . Smokeless tobacco: Never Used  Vaping Use  . Vaping Use: Never used  Substance Use Topics  . Alcohol use: Yes    Comment: occ  . Drug use: Yes    Types: Marijuana    Comment: occ    Home Medications Prior to Admission medications   Medication Sig Start Date End Date Taking? Authorizing Provider  hydrochlorothiazide (HYDRODIURIL) 25 MG tablet Take 1 tablet (25 mg total) by mouth daily. Patient not taking: Reported on 06/10/2020 06/05/20    Chancy Milroy, MD  HYDROmorphone (DILAUDID) 2 MG tablet Take 1 tablet (2 mg total) by mouth every 4 (four) hours as needed for severe pain. Patient not taking: Reported on 07/16/2020 06/05/20   Chancy Milroy, MD  ibuprofen (ADVIL) 800 MG tablet Take 1 tablet (800 mg total) by mouth 3 (three) times daily. 06/05/20   Chancy Milroy, MD    Allergies    Amoxicillin and Percocet [oxycodone-acetaminophen]  Review of Systems   Review of Systems  Constitutional: Negative for chills and fever.  HENT: Negative for congestion and rhinorrhea.   Respiratory: Negative for cough and shortness of breath.   Cardiovascular: Negative for chest pain and palpitations.  Gastrointestinal: Negative for abdominal pain, bowel incontinence, diarrhea, nausea and vomiting.  Genitourinary: Negative for bladder incontinence, difficulty urinating and dysuria.  Musculoskeletal: Positive for back pain. Negative for arthralgias.  Skin: Negative for rash and wound.  Neurological: Negative for weakness, light-headedness, numbness and headaches.    Physical Exam Updated Vital Signs BP (!) 161/115 (BP Location: Right Arm)   Pulse 73   Temp 98.1 F (36.7 C) (Oral)   Resp 16   Ht 5\' 2"  (1.575 m)   Wt 93.4 kg   LMP  (LMP Unknown)   SpO2 100%   BMI 37.68 kg/m   Physical Exam Vitals and nursing note reviewed. Exam conducted with a chaperone present.  Constitutional:      General: She is not in acute distress.    Appearance: Normal appearance.  HENT:     Head: Normocephalic and atraumatic.     Nose: No rhinorrhea.  Eyes:     General:        Right eye: No discharge.        Left eye: No discharge.     Conjunctiva/sclera: Conjunctivae normal.  Cardiovascular:     Rate and Rhythm: Normal rate and regular rhythm.  Pulmonary:     Effort: Pulmonary effort is normal. No respiratory distress.     Breath sounds: No stridor.  Abdominal:     General: Abdomen is flat. There is no distension.     Palpations:  Abdomen is soft.  Musculoskeletal:        General: Tenderness present. No signs of injury.     Comments: Mild paraspinal tenderness and superior gluteal tenderness on the left, normal strength in the lower extremities able to ambulate no bony tenderness  Skin:    General: Skin is warm and dry.  Neurological:     General: No focal deficit present.     Mental Status: She is alert. Mental status is at  baseline.     Motor: No weakness.  Psychiatric:        Mood and Affect: Mood normal.        Behavior: Behavior normal.     ED Results / Procedures / Treatments   Labs (all labs ordered are listed, but only abnormal results are displayed) Labs Reviewed - No data to display  EKG None  Radiology No results found.  Procedures Procedures (including critical care time)  Medications Ordered in ED Medications - No data to display  ED Course  I have reviewed the triage vital signs and the nursing notes.  Pertinent labs & imaging results that were available during my care of the patient were reviewed by me and considered in my medical decision making (see chart for details).    MDM Rules/Calculators/A&P                          Left-sided lumbar sciatica, history of the same previous surgery on the other side.  Symptoms feel identical.  No trauma no red flags.  Normal neurologic exam no bony tenderness no imaging needed.  Anti-inflammatories muscle relaxers and outpatient follow-up with her spine surgeon are recommended.  Patient is safe for discharge home strict return precautions given. Final Clinical Impression(s) / ED Diagnoses Final diagnoses:  None    Rx / DC Orders ED Discharge Orders    None       Breck Coons, MD 09/03/20 [phone]

## 2020-09-03 NOTE — Discharge Instructions (Addendum)
You can take 600 mg of ibuprofen every 6 hours, you can take 1000 mg of Tylenol every 6 hours, you can alternate these every 3 or you can take them together.  

## 2020-09-11 ENCOUNTER — Ambulatory Visit: Payer: Self-pay

## 2020-09-11 ENCOUNTER — Encounter: Payer: Self-pay | Admitting: Orthopaedic Surgery

## 2020-09-11 ENCOUNTER — Ambulatory Visit (INDEPENDENT_AMBULATORY_CARE_PROVIDER_SITE_OTHER): Payer: 59 | Admitting: Orthopaedic Surgery

## 2020-09-11 VITALS — BP 157/104 | HR 95 | Ht 62.5 in | Wt 206.0 lb

## 2020-09-11 DIAGNOSIS — M545 Low back pain, unspecified: Secondary | ICD-10-CM

## 2020-09-11 NOTE — Progress Notes (Signed)
Office Visit Note   Patient: Meredith York           Date of Birth: 03/19/1976           MRN: [id number] Visit Date: 09/11/2020              Requested by: Minette Brine, [facility] [address] [place],  [place] [address] PCP: Minette Brine, FNP   Assessment & Plan: Visit Diagnoses:  1. Acute left-sided low back pain, unspecified whether sciatica present     Plan: Patient likely has some recurrent disc protrusion.  We reviewed her old MRI scan from 2016.  She has single level disc degeneration with large disc protrusion on the left.  We will check her back in 2 months if her symptoms increase she will call and we can proceed with MRI imaging with and without contrast rule out recurrent disc protrusion left L5-S1.  We discussed with her that her symptoms may settle down.  She will work on weight loss walking program she can use an anti-inflammatory and occasionally Flexeril.  Recheck 2 months.  Follow-Up Instructions: No follow-ups on file.   Orders:  Orders Placed This Encounter  Procedures  . XR Lumbar Spine 2-3 Views   No orders of the defined types were placed in this encounter.     Procedures: No procedures performed   Clinical Data: No additional findings.   Subjective: Chief Complaint  Patient presents with  . Lower Back - Pain    HPI 44 year old female returns with recurrent back pain leg pain after microdiscectomy in April 2016.  She has had symptoms for the last 3 weeks with numbness and notices pain with straight leg raising.  She has 3 children age 6 down to age 25.  She had done well from a microdiscectomy up until 2 months ago.  She had recent hysterectomy gained some weight due to medication and now trying to get the weight back off.  She was out of work for 6 weeks after the hysterectomy and is resumed janitorial work.  She denies associated bowel bladder symptoms.  She is using Flexeril at night which seems to help.  Pain wakes her up when she rolls  over on her left side.  Review of Systems all other systems noncontributory.   Objective: Vital Signs: BP (!) 157/104   Pulse 95   Ht 5' 2.5" (1.588 m)   Wt 206 lb (93.4 kg)   LMP  (LMP Unknown)   BMI 37.08 kg/m   Physical Exam Constitutional:      Appearance: She is well-developed.  HENT:     Head: Normocephalic.     Right Ear: External ear normal.     Left Ear: External ear normal.  Eyes:     Pupils: Pupils are equal, round, and reactive to light.  Neck:     Thyroid: No thyromegaly.     Trachea: No tracheal deviation.  Cardiovascular:     Rate and Rhythm: Normal rate.  Pulmonary:     Effort: Pulmonary effort is normal.  Abdominal:     Palpations: Abdomen is soft.  Skin:    General: Skin is warm and dry.  Neurological:     Mental Status: She is alert and oriented to person, place, and time.  Psychiatric:        Behavior: Behavior normal.     Ortho Exam patient has pain straight leg raising the left 90 degrees positive up to compression test increased pain with foot  dorsiflexion sciatic notch tenderness on the left.  Well-healed incision L5-S1.  No right sciatic notch tenderness negative straight leg raise on the right.  Patient is able to heel and toe walk.  She has trace gastrocsoleus atrophy.  Peroneals are strong. Specialty Comments:  No specialty comments available.  Imaging: No results found.   PMFS History: Patient Active Problem List   Diagnosis Date Noted  . Post-operative state 06/04/2020  . Visit for routine gyn exam 11/29/2019  . Encounter for screening mammogram for breast cancer 11/29/2019  . History of bilateral tubal ligation 11/29/2019  . S/P cesarean section 07/14/2016  . Hypertension 07/05/2011   Past Medical History:  Diagnosis Date  . Carpal tunnel syndrome on right   . Chlamydia   . Gestational diabetes mellitus   . Hypertension    no medicaqtions at this time  . Migraines   . Ovarian cyst   . S/P lumbar discectomy 04/10/2015    . Tendonitis    right wrist  . Trichomonas   . Type 2 diabetes mellitus ([facility])    not currently taking medication    Family History  Problem Relation Age of Onset  . Hypertension Maternal Aunt   . Hypertension Paternal Aunt   . Club foot Daughter   . Hirschsprung's disease Daughter   . Learning disabilities Daughter   . Polydactyly Daughter   . Hypertension Mother   . Diabetes Mother   . Heart disease Father   . Anesthesia problems Neg Hx   . Hypotension Neg Hx   . Malignant hyperthermia Neg Hx   . Pseudochol deficiency Neg Hx     Past Surgical History:  Procedure Laterality Date  . ABDOMINAL HYSTERECTOMY    . BACK SURGERY    . CESAREAN SECTION  12/27/2011   Procedure: CESAREAN SECTION;  Surgeon: Shelly Bombard, MD;  Location: [facility];  Service: Gynecology;  Laterality: N/A;  Primary cesarean section with delivery of baby girl at 44. Apgars 3/3/6  . CESAREAN SECTION N/A 08/03/2016   Procedure: CESAREAN SECTION;  Surgeon: Osborne Oman, MD;  Location: [facility];  Service: Obstetrics;  Laterality: N/A;  . CHOLECYSTECTOMY  02/2009  . DILATION AND CURETTAGE OF UTERUS N/A 06/25/2014   Procedure: DILATATION AND CURETTAGE;  Surgeon: Donnamae Jude, MD;  Location: [facility];  Service: Gynecology;  Laterality: N/A;  . LUMBAR LAMINECTOMY/DECOMPRESSION MICRODISCECTOMY Left 04/10/2015   Procedure: Left L5-S1 Microdiscectomy;  Surgeon: Marybelle Killings, MD;  Location: [facility];  Service: Orthopedics;  Laterality: Left;  Marland Kitchen VAGINAL HYSTERECTOMY N/A 06/04/2020   Procedure: HYSTERECTOMY VAGINAL, MORCELLATION;  Surgeon: Chancy Milroy, MD;  Location: [facility];  Service: Gynecology;  Laterality: N/A;  . WISDOM TOOTH EXTRACTION     Social History   Occupational History  . Not on file  Tobacco Use  . Smoking status: Current Every Day Smoker    Packs/day: 0.25    Years: 16.00    Pack years: 4.00    Types: Cigarettes  . Smokeless tobacco: Never Used  Vaping Use  . Vaping  Use: Never used  Substance and Sexual Activity  . Alcohol use: Yes    Comment: occ  . Drug use: Yes    Types: Marijuana    Comment: occ  . Sexual activity: Not on file

## 2020-09-12 ENCOUNTER — Telehealth: Payer: Self-pay

## 2020-09-12 NOTE — Telephone Encounter (Signed)
-----   Message from Minette Brine, [place] sent at 09/11/2020  5:14 PM EDT ----- Regarding: appt She needs to schedule an appt for her blood pressure was elevated at the orthopedic office ----- Message ----- From: Marybelle Killings, MD Sent: 09/11/2020   9:28 AM EDT To: Minette Brine, FNP

## 2020-09-12 NOTE — Telephone Encounter (Signed)
Minette Brine, FNP  Candiss Norse T, CMA She needs to schedule an appt for her blood pressure was elevated at the orthopedic office  Pt stated she will call back to schedule an appt before the office closes today

## 2020-09-24 ENCOUNTER — Other Ambulatory Visit: Payer: Self-pay

## 2020-09-24 ENCOUNTER — Ambulatory Visit (INDEPENDENT_AMBULATORY_CARE_PROVIDER_SITE_OTHER): Payer: 59 | Admitting: Nurse Practitioner

## 2020-09-24 ENCOUNTER — Encounter: Payer: Self-pay | Admitting: Nurse Practitioner

## 2020-09-24 VITALS — BP 118/80 | HR 101 | Temp 98.5°F | Ht 63.0 in | Wt 207.8 lb

## 2020-09-24 DIAGNOSIS — E1169 Type 2 diabetes mellitus with other specified complication: Secondary | ICD-10-CM | POA: Insufficient documentation

## 2020-09-24 DIAGNOSIS — E119 Type 2 diabetes mellitus without complications: Secondary | ICD-10-CM | POA: Insufficient documentation

## 2020-09-24 DIAGNOSIS — Z1159 Encounter for screening for other viral diseases: Secondary | ICD-10-CM

## 2020-09-24 DIAGNOSIS — Z23 Encounter for immunization: Secondary | ICD-10-CM | POA: Diagnosis not present

## 2020-09-24 DIAGNOSIS — R7303 Prediabetes: Secondary | ICD-10-CM

## 2020-09-24 DIAGNOSIS — Z6836 Body mass index (BMI) 36.0-36.9, adult: Secondary | ICD-10-CM

## 2020-09-24 DIAGNOSIS — I10 Essential (primary) hypertension: Secondary | ICD-10-CM | POA: Diagnosis not present

## 2020-09-24 DIAGNOSIS — Z0000 Encounter for general adult medical examination without abnormal findings: Secondary | ICD-10-CM

## 2020-09-24 LAB — POCT URINALYSIS DIPSTICK
Bilirubin, UA: NEGATIVE
Glucose, UA: NEGATIVE
Ketones, UA: NEGATIVE
Leukocytes, UA: NEGATIVE
Nitrite, UA: NEGATIVE
Protein, UA: NEGATIVE
Spec Grav, UA: 1.025 (ref 1.010–1.025)
Urobilinogen, UA: 0.2 E.U./dL
pH, UA: 6 (ref 5.0–8.0)

## 2020-09-24 LAB — POCT UA - MICROALBUMIN
Albumin/Creatinine Ratio, Urine, POC: 30
Creatinine, POC: 50 mg/dL
Microalbumin Ur, POC: 10 mg/L

## 2020-09-24 MED ORDER — HYDROCHLOROTHIAZIDE 25 MG PO TABS: 25.0000 mg | ORAL_TABLET | Freq: Every day | ORAL | 1 refills | Status: DC

## 2020-09-24 NOTE — Progress Notes (Signed)
I,Yamilka Roman [employer] as a Education administrator for [employer], FNP.,have documented all relevant documentation on the behalf of Minette Brine, FNP,as directed by  Minette Brine, FNP while in the presence of Minette Brine, [place]. This visit occurred during the SARS-CoV-2 public health emergency.  Safety protocols were in place, including screening questions prior to the visit, additional usage of staff PPE, and extensive cleaning of exam room while observing appropriate contact time as indicated for disinfecting solutions.  Subjective:     Patient ID: Meredith York , female    DOB: [birth year] , 44 y.o.   MRN: [id number]   Chief Complaint  Patient presents with  . Annual Exam    HPI  Patient here for HM  Wt Readings from Last 3 Encounters: 09/24/20 : 207 lb 12.8 oz (94.3 kg) 09/11/20 : 206 lb (93.4 kg) 09/03/20 : 206 lb (93.4 kg)  She has taken metformin in the past in 2016 while pregnant.  After she had her child it improved.  She did not tolerate well and she had lost a significant amount of weight.    Hypertension This is a chronic problem. The current episode started more than 1 year ago. The problem is controlled. Pertinent negatives include no anxiety, blurred vision or peripheral edema. Risk factors for coronary artery disease include obesity and sedentary lifestyle. Past treatments include diuretics. There are no compliance problems.  There is no history of angina or kidney disease.     Past Medical History:  Diagnosis Date  . Carpal tunnel syndrome on right   . Chlamydia   . Gestational diabetes mellitus   . Hypertension    no medicaqtions at this time  . Migraines   . Ovarian cyst   . S/P lumbar discectomy 04/10/2015  . Tendonitis    right wrist  . Trichomonas   . Type 2 diabetes mellitus ([facility])    not currently taking medication     Family History  Problem Relation Age of Onset  . Hypertension Maternal Aunt   . Hypertension Paternal Aunt   . Club foot Daughter   .  Hirschsprung's disease Daughter   . Learning disabilities Daughter   . Polydactyly Daughter   . Hypertension Mother   . Diabetes Mother   . Heart disease Father   . Anesthesia problems Neg Hx   . Hypotension Neg Hx   . Malignant hyperthermia Neg Hx   . Pseudochol deficiency Neg Hx      Current Outpatient Medications:  .  cyclobenzaprine (FLEXERIL) 10 MG tablet, Take 1 tablet (10 mg total) by mouth 2 (two) times daily as needed for muscle spasms., Disp: 20 tablet, Rfl: 0 .  hydrochlorothiazide (HYDRODIURIL) 25 MG tablet, Take 1 tablet (25 mg total) by mouth daily., Disp: 90 tablet, Rfl: 1   Allergies  Allergen Reactions  . Amoxicillin Hives and Itching    Has patient had a PCN reaction causing immediate rash, facial/tongue/throat swelling, SOB or lightheadedness with hypotension: No Has patient had a PCN reaction causing severe rash involving mucus membranes or skin necrosis: No Has patient had a PCN reaction that required hospitalization No Has patient had a PCN reaction occurring within the last 10 years: No If all of the above answers are "NO", then may proceed with Cephalosporin use.   Marland Kitchen Percocet [Oxycodone-Acetaminophen] Itching      The patient states she is status post hysterectomy . Negative for: breast discharge, breast lump(s), breast pain and breast self exam. Associated symptoms include abnormal vaginal bleeding. Pertinent  negatives include abnormal bleeding (hematology), anxiety, decreased libido, depression, difficulty falling sleep, dyspareunia, history of infertility, nocturia, sexual dysfunction, sleep disturbances, urinary incontinence, urinary urgency, vaginal discharge and vaginal itching. Diet regular; she is cutting back on her portions. The patient states her exercise level is walking at work and will go out to the track at work.    The patient's tobacco use is:  Social History   Tobacco Use  Smoking Status Current Every Day Smoker  . Packs/day: 0.25  .  Years: 16.00  . Pack years: 4.00  . Types: Cigarettes  Smokeless Tobacco Never Used   She has been exposed to passive smoke. The patient's alcohol use is:  Social History   Substance and Sexual Activity  Alcohol Use Yes   Comment: occ   Additional information: Last pap 2020, she will have a PAP next year then every 3-5 years.     Review of Systems  Constitutional: Negative.   HENT: Negative.   Eyes: Negative.  Negative for blurred vision.  Respiratory: Negative.   Cardiovascular: Negative.   Gastrointestinal: Negative.   Endocrine: Negative.   Genitourinary: Negative.   Musculoskeletal: Negative.   Skin: Negative.   Allergic/Immunologic: Negative.   Neurological: Negative.   Hematological: Negative.   Psychiatric/Behavioral: Negative.      Today's Vitals   09/24/20 0954  BP: 118/80  Pulse: (!) 101  Temp: 98.5 F (36.9 C)  TempSrc: Oral  Weight: 207 lb 12.8 oz (94.3 kg)  Height: 5' 3"  (1.6 m)  PainSc: 7   PainLoc: Hip   Body mass index is 36.81 kg/m.   Objective:  Physical Exam Constitutional:      General: She is not in acute distress.    Appearance: Normal appearance. She is well-developed. She is obese.  HENT:     Head: Normocephalic and atraumatic.     Right Ear: Hearing, tympanic membrane, ear canal and external ear normal. There is no impacted cerumen.     Left Ear: Hearing, tympanic membrane, ear canal and external ear normal. There is no impacted cerumen.     Nose:     Comments: Deferred - masked    Mouth/Throat:     Comments: Deferred - masked Eyes:     General: Lids are normal.     Extraocular Movements: Extraocular movements intact.     Conjunctiva/sclera: Conjunctivae normal.     Pupils: Pupils are equal, round, and reactive to light.     Funduscopic exam:    Right eye: No papilledema.        Left eye: No papilledema.  Neck:     Thyroid: No thyroid mass.     Vascular: No carotid bruit.  Cardiovascular:     Rate and Rhythm: Normal rate  and regular rhythm.     Pulses: Normal pulses.     Heart sounds: Normal heart sounds. No murmur heard.   Pulmonary:     Effort: Pulmonary effort is normal. No respiratory distress.     Breath sounds: Normal breath sounds. No wheezing.  Chest:     Chest wall: No mass or tenderness.     Breasts: Tanner Score is 5.        Right: Normal. No swelling or tenderness.        Left: Normal. No swelling or tenderness.  Abdominal:     General: Abdomen is flat. Bowel sounds are normal. There is no distension.     Palpations: Abdomen is soft.     Tenderness: There is  no abdominal tenderness.  Genitourinary:    Rectum: Guaiac result negative.  Musculoskeletal:        General: No swelling. Normal range of motion.     Cervical back: Full passive range of motion without pain, normal range of motion and neck supple.     Right lower leg: No edema.     Left lower leg: No edema.  Lymphadenopathy:     Upper Body:     Right upper body: No supraclavicular or axillary adenopathy.     Left upper body: No supraclavicular or axillary adenopathy.  Skin:    General: Skin is warm and dry.     Capillary Refill: Capillary refill takes less than 2 seconds.  Neurological:     General: No focal deficit present.     Mental Status: She is alert and oriented to person, place, and time.     Cranial Nerves: No cranial nerve deficit.     Sensory: No sensory deficit.  Psychiatric:        Mood and Affect: Mood normal.        Behavior: Behavior normal.        Thought Content: Thought content normal.        Judgment: Judgment normal.         Assessment And Plan:     1. Encounter for general adult medical examination w/o abnormal findings . Behavior modifications discussed and diet history reviewed.   . Pt will continue to exercise regularly and modify diet with low GI, plant based foods and decrease intake of processed foods.  . Recommend intake of daily multivitamin, Vitamin D, and calcium.  . Recommend  mammogram (up to date) for preventive screenings, as well as recommend immunizations that include influenza, TDAP - VITAMIN D 25 Hydroxy (Vit-D Deficiency, Fractures) - Lipid panel  2. Need for influenza vaccination   Influenza vaccine administered  Encouraged to take Tylenol as needed for fever or muscle aches. - Flu Vaccine QUAD 6+ mos PF IM (Fluarix Quad PF)  3. Encounter for hepatitis C screening test for low risk patient  Will check Hepatitis C screening due to recent recommendations to screen all adults 18 years and older - CBC - Hepatitis C antibody  4. BMI 36.0-36.9,adult  5. Prediabetes  Chronic, controlled  No current medications at this time but will start Rybelsus if she has challenges with taking the medication daily she is willing to try the injectable (Ozempic). Coupon card given as well  Discussed side effects to include nausea and constipation she denies thyroid cancer or history of pancreatitis  Encouraged to limit intake of sugary foods and drinks  Encouraged to increase physical activity to 150 minutes per week  Diabetic foot exam done, normal - Hemoglobin A1c - POCT Urinalysis Dipstick (81002) - POCT UA - Microalbumin  6. Primary hypertension  Chronic, stable  Continue with current medications  When she had not been taking her medication daily her blood pressure increased while at the Orthopedics. - CMP14+EGFR - hydrochlorothiazide (HYDRODIURIL) 25 MG tablet; Take 1 tablet (25 mg total) by mouth daily.  Dispense: 90 tablet; Refill: 1    Patient was given opportunity to ask questions. Patient verbalized understanding of the plan and was able to repeat key elements of the plan. All questions were answered to their satisfaction.    Teola Bradley, FNP, have reviewed all documentation for this visit. The documentation on 09/24/20 for the exam, diagnosis, procedures, and orders are all accurate and complete.  THE  PATIENT IS ENCOURAGED TO PRACTICE  SOCIAL DISTANCING DUE TO THE COVID-19 PANDEMIC.

## 2020-09-24 NOTE — Patient Instructions (Signed)
Health Maintenance, Female Adopting a healthy lifestyle and getting preventive care are important in promoting health and wellness. Ask your health care provider about:  The right schedule for you to have regular tests and exams.  Things you can do on your own to prevent diseases and keep yourself healthy. What should I know about diet, weight, and exercise? Eat a healthy diet   Eat a diet that includes plenty of vegetables, fruits, low-fat dairy products, and lean protein.  Do not eat a lot of foods that are high in solid fats, added sugars, or sodium. Maintain a healthy weight Body mass index (BMI) is used to identify weight problems. It estimates body fat based on height and weight. Your health care provider can help determine your BMI and help you achieve or maintain a healthy weight. Get regular exercise Get regular exercise. This is one of the most important things you can do for your health. Most adults should:  Exercise for at least 150 minutes each week. The exercise should increase your heart rate and make you sweat (moderate-intensity exercise).  Do strengthening exercises at least twice a week. This is in addition to the moderate-intensity exercise.  Spend less time sitting. Even light physical activity can be beneficial. Watch cholesterol and blood lipids Have your blood tested for lipids and cholesterol at 43 years of age, then have this test every 5 years. Have your cholesterol levels checked more often if:  Your lipid or cholesterol levels are high.  You are older than 43 years of age.  You are at high risk for heart disease. What should I know about cancer screening? Depending on your health history and family history, you may need to have cancer screening at various ages. This may include screening for:  Breast cancer.  Cervical cancer.  Colorectal cancer.  Skin cancer.  Lung cancer. What should I know about heart disease, diabetes, and high blood  pressure? Blood pressure and heart disease  High blood pressure causes heart disease and increases the risk of stroke. This is more likely to develop in people who have high blood pressure readings, are of African descent, or are overweight.  Have your blood pressure checked: ? Every 3-5 years if you are 18-39 years of age. ? Every year if you are 40 years old or older. Diabetes Have regular diabetes screenings. This checks your fasting blood sugar level. Have the screening done:  Once every three years after age 40 if you are at a normal weight and have a low risk for diabetes.  More often and at a younger age if you are overweight or have a high risk for diabetes. What should I know about preventing infection? Hepatitis B If you have a higher risk for hepatitis B, you should be screened for this virus. Talk with your health care provider to find out if you are at risk for hepatitis B infection. Hepatitis C Testing is recommended for:  Everyone born from 1945 through 1965.  Anyone with known risk factors for hepatitis C. Sexually transmitted infections (STIs)  Get screened for STIs, including gonorrhea and chlamydia, if: ? You are sexually active and are younger than 43 years of age. ? You are older than 43 years of age and your health care provider tells you that you are at risk for this type of infection. ? Your sexual activity has changed since you were last screened, and you are at increased risk for chlamydia or gonorrhea. Ask your health care provider if   you are at risk.  Ask your health care provider about whether you are at high risk for HIV. Your health care provider may recommend a prescription medicine to help prevent HIV infection. If you choose to take medicine to prevent HIV, you should first get tested for HIV. You should then be tested every 3 months for as long as you are taking the medicine. Pregnancy  If you are about to stop having your period (premenopausal) and  you may become pregnant, seek counseling before you get pregnant.  Take 400 to 800 micrograms (mcg) of folic acid every day if you become pregnant.  Ask for birth control (contraception) if you want to prevent pregnancy. Osteoporosis and menopause Osteoporosis is a disease in which the bones lose minerals and strength with aging. This can result in bone fractures. If you are 65 years old or older, or if you are at risk for osteoporosis and fractures, ask your health care provider if you should:  Be screened for bone loss.  Take a calcium or vitamin D supplement to lower your risk of fractures.  Be given hormone replacement therapy (HRT) to treat symptoms of menopause. Follow these instructions at home: Lifestyle  Do not use any products that contain nicotine or tobacco, such as cigarettes, e-cigarettes, and chewing tobacco. If you need help quitting, ask your health care provider.  Do not use street drugs.  Do not share needles.  Ask your health care provider for help if you need support or information about quitting drugs. Alcohol use  Do not drink alcohol if: ? Your health care provider tells you not to drink. ? You are pregnant, may be pregnant, or are planning to become pregnant.  If you drink alcohol: ? Limit how much you use to 0-1 drink a day. ? Limit intake if you are breastfeeding.  Be aware of how much alcohol is in your drink. In the [place] one drink equals one 12 oz bottle of beer (355 mL), one 5 oz glass of wine (148 mL), or one 1 oz glass of hard liquor (44 mL). General instructions  Schedule regular health, dental, and eye exams.  Stay current with your vaccines.  Tell your health care provider if: ? You often feel depressed. ? You have ever been abused or do not feel safe at home. Summary  Adopting a healthy lifestyle and getting preventive care are important in promoting health and wellness.  Follow your health care provider's instructions about healthy  diet, exercising, and getting tested or screened for diseases.  Follow your health care provider's instructions on monitoring your cholesterol and blood pressure. This information is not intended to replace advice given to you by your health care provider. Make sure you discuss any questions you have with your health care provider. Document Revised: 11/30/2018 Document Reviewed: 11/30/2018 Elsevier Patient Education  2020 [employer] Inc[employer]  

## 2020-09-25 DIAGNOSIS — Z23 Encounter for immunization: Secondary | ICD-10-CM

## 2020-09-25 LAB — CMP14+EGFR
ALT: 26 IU/L (ref 0–32)
AST: 23 IU/L (ref 0–40)
Albumin/Globulin Ratio: 1.9 (ref 1.2–2.2)
Albumin: 4.5 g/dL (ref 3.8–4.8)
Alkaline Phosphatase: 100 IU/L (ref 44–121)
BUN/Creatinine Ratio: 12 (ref 9–23)
BUN: 8 mg/dL (ref 6–24)
Bilirubin Total: 0.3 mg/dL (ref 0.0–1.2)
CO2: 22 mmol/L (ref 20–29)
Calcium: 9.7 mg/dL (ref 8.7–10.2)
Chloride: 101 mmol/L (ref 96–106)
Creatinine, Ser: 0.67 mg/dL (ref 0.57–1.00)
GFR calc Af Amer: 125 mL/min/{1.73_m2} (ref >59)
GFR calc non Af Amer: 108 mL/min/{1.73_m2} (ref >59)
Globulin, Total: 2.4 g/dL (ref 1.5–4.5)
Glucose: 80 mg/dL (ref 65–99)
Potassium: 4.2 mmol/L (ref 3.5–5.2)
Sodium: 138 mmol/L (ref 134–144)
Total Protein: 6.9 g/dL (ref 6.0–8.5)

## 2020-09-25 LAB — CBC
Hematocrit: 40.9 % (ref 34.0–46.6)
Hemoglobin: 13.3 g/dL (ref 11.1–15.9)
MCH: 29.6 pg (ref 26.6–33.0)
MCHC: 32.5 g/dL (ref 31.5–35.7)
MCV: 91 fL (ref 79–97)
Platelets: 349 10*3/uL (ref 150–450)
RBC: 4.49 x10E6/uL (ref 3.77–5.28)
RDW: 13.8 % (ref 11.7–15.4)
WBC: 7.8 10*3/uL (ref 3.4–10.8)

## 2020-09-25 LAB — LIPID PANEL
Chol/HDL Ratio: 3.8 ratio (ref 0.0–4.4)
Cholesterol, Total: 196 mg/dL (ref 100–199)
HDL: 51 mg/dL (ref >39)
LDL Chol Calc (NIH): 132 mg/dL — ABNORMAL HIGH (ref 0–99)
Triglycerides: 73 mg/dL (ref 0–149)
VLDL Cholesterol Cal: 13 mg/dL (ref 5–40)

## 2020-09-25 LAB — HEPATITIS C ANTIBODY: Hep C Virus Ab: <0.1 s/co ratio (ref 0.0–0.9)

## 2020-09-25 LAB — VITAMIN D 25 HYDROXY (VIT D DEFICIENCY, FRACTURES): Vit D, 25-Hydroxy: 10.5 ng/mL — ABNORMAL LOW (ref 30.0–100.0)

## 2020-09-25 LAB — HEMOGLOBIN A1C
Est. average glucose Bld gHb Est-mCnc: 128 mg/dL
Hgb A1c MFr Bld: 6.1 % — ABNORMAL HIGH (ref 4.8–5.6)

## 2020-10-01 ENCOUNTER — Encounter: Payer: Self-pay | Admitting: Nurse Practitioner

## 2020-10-10 ENCOUNTER — Other Ambulatory Visit: Payer: Self-pay

## 2020-10-10 ENCOUNTER — Encounter: Payer: Self-pay | Admitting: Surgery

## 2020-10-10 ENCOUNTER — Ambulatory Visit (INDEPENDENT_AMBULATORY_CARE_PROVIDER_SITE_OTHER): Payer: 59 | Admitting: Surgery

## 2020-10-10 ENCOUNTER — Telehealth: Payer: Self-pay

## 2020-10-10 VITALS — BP 137/96 | HR 90 | Ht 63.0 in | Wt 207.8 lb

## 2020-10-10 DIAGNOSIS — M5416 Radiculopathy, lumbar region: Secondary | ICD-10-CM

## 2020-10-10 DIAGNOSIS — R29898 Other symptoms and signs involving the musculoskeletal system: Secondary | ICD-10-CM

## 2020-10-10 MED ORDER — TRAMADOL HCL 50 MG PO TABS: 50.0000 mg | ORAL_TABLET | Freq: Four times a day (QID) | ORAL | 0 refills | Status: DC | PRN

## 2020-10-10 MED ORDER — METHOCARBAMOL 500 MG PO TABS: 500.0000 mg | ORAL_TABLET | Freq: Three times a day (TID) | ORAL | 0 refills | Status: DC | PRN

## 2020-10-10 NOTE — Progress Notes (Signed)
44 year old black female with a history of low back pain and left lower extremity radiculopathy returns.  Patient continues have ongoing symptoms but now has new symptoms of left dropfoot has been ongoing over the last couple weeks.  No right leg symptoms.  Denies bowel or bladder incontinence.  States that she has not had any improvement in her pain.  Seen by Dr. Lorin Mercy last month and mentioned getting a lumbar MRI if she did not improve.  Patient states that she is had previous lumbar spine surgery several years ago.   Exam Pleasant female alert and oriented in no acute distress.  Gait is antalgic.  Left-sided lumbar paraspinal tenderness.  Negative logroll bilateral hips.  Positive left straight leg raise.  Negative on the right side.  Patient does have trace left anterior tib and gastroc weakness.   Plan Patient's ongoing pain that is failed conservative treatment and with her new left lower extremity weakness I recommend proceeding with lumbar MRI with and without contrast to rule out HNP.  Follow-up with Dr. Lorin Mercy after completion to discuss results.  I did order a scan as a stat study.  I sent in prescriptions for Ultram and Robaxin to her pharmacy.  She was previously given Flexeril at the ER and this will be discontinued.

## 2020-10-10 NOTE — Telephone Encounter (Signed)
Patient called she said [facility] imaging called her a few minutes ago, they stated if she gets the MRI authorized and marked as urgentt she can be seen tomorrow. She stated her appt. Is tomorrow at 3:50 call back:[phone]

## 2020-10-11 ENCOUNTER — Other Ambulatory Visit: Payer: 59

## 2020-10-11 NOTE — Telephone Encounter (Signed)
Patient is aware that authorization is in review.  Patient spoke with Gabriel Cirri S.this morning concerning MRI.

## 2020-10-22 ENCOUNTER — Ambulatory Visit
Admission: RE | Admit: 2020-10-22 | Discharge: 2020-10-22 | Disposition: A | Discharge status: Home / Self Care | Payer: 59 | Source: Ambulatory Visit | Attending: Surgery | Admitting: Surgery

## 2020-10-22 DIAGNOSIS — R29898 Other symptoms and signs involving the musculoskeletal system: Secondary | ICD-10-CM

## 2020-10-22 DIAGNOSIS — M5416 Radiculopathy, lumbar region: Secondary | ICD-10-CM

## 2020-10-22 MED ORDER — GADOBENATE DIMEGLUMINE 529 MG/ML IV SOLN
19.0000 mL | Freq: Once | INTRAVENOUS | Status: AC | PRN
  Administered 2020-10-22: 19 mL via INTRAVENOUS

## 2020-10-30 ENCOUNTER — Other Ambulatory Visit: Payer: Self-pay

## 2020-10-30 ENCOUNTER — Encounter: Payer: Self-pay | Admitting: Orthopaedic Surgery

## 2020-10-30 ENCOUNTER — Ambulatory Visit (INDEPENDENT_AMBULATORY_CARE_PROVIDER_SITE_OTHER): Payer: 59 | Admitting: Orthopaedic Surgery

## 2020-10-30 DIAGNOSIS — M5126 Other intervertebral disc displacement, lumbar region: Secondary | ICD-10-CM | POA: Diagnosis not present

## 2020-10-30 NOTE — Progress Notes (Signed)
Office Visit Note   Patient: Meredith York           Date of Birth: 1976-05-02           MRN: [id number] Visit Date: 10/30/2020              Requested by: Minette Brine, [address],  [place] [id number] PCP: Minette Brine, FNP   Assessment & Plan: Visit Diagnoses:  1. Recurrent herniation of lumbar disc     Plan: MRI scan is reviewed with her.  Patient has recurrent disc present on the left at L5-S1 left paracentral and some foraminal.  Previously there was problems getting an epidural which was unsuccessful back in 2016 prior to her surgery.  We will set up for 1 epidural.  Patient states the pain is significant and she is taking Ultram as well as muscle relaxant.  Office follow-up after single epidural.  Follow-Up Instructions:   ROV after ESI lumbar   Orders:  No orders of the defined types were placed in this encounter.  No orders of the defined types were placed in this encounter.     Procedures: No procedures performed   Clinical Data: No additional findings.   Subjective: Chief Complaint  Patient presents with  . Lower Back - Pain, Follow-up    MRI lumbar review    HPI 44 year old female mother of 32 youngest age 56, returns with ongoing problems with low back pain and leg pain present since the beginning of September.  She states she is having left leg weakness numbness no symptoms on the right side.  Increased pain with bending she has trouble sleeping.  Problems taking care of her children.  Problems putting on her shoes and socks.  She states with her knee straight she has pain and numbness that shoots down her leg into her left foot.  Previous microdiscectomy by me April 2016 on the left L5-S1.  New MRI scan has been available and is reviewed today with patient.  Review of Systems 14 point system update positive for controlled hypertension prediabetes.  All other systems are negative.   Objective: Vital Signs: BP (!) 155/99   Pulse  76   Ht 5' 2.5" (1.588 m)   Wt 207 lb (93.9 kg)   LMP  (LMP Unknown)   BMI 37.26 kg/m   Physical Exam Constitutional:      Appearance: She is well-developed.  HENT:     Head: Normocephalic.     Right Ear: External ear normal.     Left Ear: External ear normal.  Eyes:     Pupils: Pupils are equal, round, and reactive to light.  Neck:     Thyroid: No thyromegaly.     Trachea: No tracheal deviation.  Cardiovascular:     Rate and Rhythm: Normal rate.  Pulmonary:     Effort: Pulmonary effort is normal.  Abdominal:     Palpations: Abdomen is soft.  Skin:    General: Skin is warm and dry.  Neurological:     Mental Status: She is alert and oriented to person, place, and time.  Psychiatric:        Behavior: Behavior normal.     Ortho Exam positive straight leg raising on the left at 60 degrees.  She not able to do a single toe raise on the left but can do it on the right.  Not able toe walk.  She has EHL and anterior tib weakness on the left  with some counter pull gastrocs.  Posterior tib is normal.  Decreased sensation dorsum plantar lateral surface of the left foot normal on the right foot pulses are 2+.  Quad strength is good no hamstring weakness. Specialty Comments:  No specialty comments available.  Imaging: Narrative & Impression  CLINICAL DATA:  Radiculopathy, lumbar region. Weakness of left lower extremity. Low back pain, greater than 6 weeks; low back pain, progressive neurologic deficit; low back pain, prior surgery, new symptoms; worsening low back pain with left lower extremity radiculopathy and weakness. Additional history provided by scanning technologist: Patient reports low back pain that radiates down left leg into knee, numbness, difficulty walking for approximately 1 month, prior lumbar spine surgery in 2016.  EXAM: MRI LUMBAR SPINE WITHOUT AND WITH CONTRAST  TECHNIQUE: Multiplanar and multiecho pulse sequences of the lumbar spine were obtained  without and with intravenous contrast.  CONTRAST:  41mL MULTIHANCE GADOBENATE DIMEGLUMINE 529 MG/ML IV SOLN  COMPARISON:  Radiographs of the lumbar spine 09/11/2020. lumbar spine MRI 01/29/2015.  FINDINGS: Segmentation: For the purposes of this dictation, five lumbar vertebrae are assumed and the caudal most well-formed intervertebral disc is designated L5-S1.  Alignment: Straightening of the expected lumbar lordosis. No significant spondylolisthesis.  Vertebrae: Vertebral body height is maintained. No focal suspicious osseous lesion or significant marrow edema.  Conus medullaris and cauda equina: Conus extends to the L1-L2 level. No signal abnormality within the visualized distal spinal cord.  Paraspinal and other soft tissues: No abnormality identified within included portions of the abdomen/retroperitoneum. Postsurgical changes to the lower dorsal lumbar soft tissues.  Disc levels:  Unless otherwise stated, the level by level findings below have not significantly changed since prior MRI 01/29/2015  Mild L5-S1 disc degeneration. Disc height and hydration are maintained at the remaining lumbar levels.  There is no significant disc herniation, spinal canal stenosis or neural foraminal narrowing at the T12-L1 through L2-L3 levels.  L3-L4: Mild facet arthrosis on the left. Minimal relative left subarticular narrowing without nerve root impingement. Central canal patent. No significant foraminal stenosis.  L4-L5: New from the prior MRI, there is a left foraminal/extraforaminal disc extrusion (series 5, image 28) (series 8, image 28) (series 2, image 12). Mild facet arthrosis/ligamentum flavum hypertrophy (greater on the left). Minimal relative left subarticular narrowing without nerve root impingement. Central canal patent. Progressive moderate left foraminal stenosis with the disc extrusion contacting the exiting left L4 nerve root within and beyond the left  neural foramen (series 2, image 12) (series 5, image 28). No significant foraminal stenosis on the right.  L5-S1: Since the prior MRI, there is been interval left hemilaminotomy and discectomy. Disc bulge with endplate spurring. Residual/recurrent shallow broad-based central disc protrusion. Facet arthrosis (greater on the left). The residual/recurrent disc protrusion contributes to moderate left and mild right subarticular narrowing with contact upon the left greater than right descending S1 nerve roots (series 4, image 34). Central canal patent. Mild/moderate bilateral neural foraminal narrowing, progressed.  IMPRESSION: Comparison is made to the prior MRI of 08/30/2015. Lumbar spondylosis and postsurgical changes as outlined and with findings most notably as follows.  At L4-L5, there is a new left foraminal/extraforaminal disc extrusion. Mild facet arthrosis/ligamentum flavum hypertrophy. Progressive moderate left foraminal stenosis with the disc extrusion contacting the exiting left L4 nerve root within and beyond the left neural foramen. Minimal relative left subarticular narrowing without nerve root impingement.  At L5-S1, there has been interval left hemilaminotomy and discectomy. Mild disc degeneration. Disc bulge with endplate spurring. Residual/recurrent  shallow broad-based central disc protrusion. Facet arthrosis (greater on the left). The residual/recurrent disc protrusion contributes to moderate left and mild right subarticular narrowing with contact upon the left greater than right descending S1 nerve roots. Mild/moderate bilateral neural foraminal narrowing, progressed.   Electronically Signed   By: Kellie Simmering DO   On: 10/22/2020 14:45      PMFS History: Patient Active Problem List   Diagnosis Date Noted  . Recurrent herniation of lumbar disc 10/30/2020  . Prediabetes 09/24/2020  . Post-operative state 06/04/2020  . Visit for routine gyn exam  11/29/2019  . Encounter for screening mammogram for breast cancer 11/29/2019  . History of bilateral tubal ligation 11/29/2019  . S/P cesarean section 07/14/2016  . Hypertension 07/05/2011   Past Medical History:  Diagnosis Date  . Carpal tunnel syndrome on right   . Chlamydia   . Gestational diabetes mellitus   . Hypertension    no medicaqtions at this time  . Migraines   . Ovarian cyst   . S/P lumbar discectomy 04/10/2015  . Tendonitis    right wrist  . Trichomonas   . Type 2 diabetes mellitus (HCC)    not currently taking medication    Family History  Problem Relation Age of Onset  . Hypertension Maternal Aunt   . Hypertension Paternal Aunt   . Club foot Daughter   . Hirschsprung's disease Daughter   . Learning disabilities Daughter   . Polydactyly Daughter   . Hypertension Mother   . Diabetes Mother   . Heart disease Father   . Anesthesia problems Neg Hx   . Hypotension Neg Hx   . Malignant hyperthermia Neg Hx   . Pseudochol deficiency Neg Hx     Past Surgical History:  Procedure Laterality Date  . ABDOMINAL HYSTERECTOMY    . BACK SURGERY    . CESAREAN SECTION  12/27/2011   Procedure: CESAREAN SECTION;  Surgeon: Shelly Bombard, MD;  Location: [facility];  Service: Gynecology;  Laterality: N/A;  Primary cesarean section with delivery of baby girl at 32. Apgars 3/3/6  . CESAREAN SECTION N/A 08/03/2016   Procedure: CESAREAN SECTION;  Surgeon: Osborne Oman, MD;  Location: [facility];  Service: Obstetrics;  Laterality: N/A;  . CHOLECYSTECTOMY  02/2009  . DILATION AND CURETTAGE OF UTERUS N/A 06/25/2014   Procedure: DILATATION AND CURETTAGE;  Surgeon: Donnamae Jude, MD;  Location: [facility];  Service: Gynecology;  Laterality: N/A;  . LUMBAR LAMINECTOMY/DECOMPRESSION MICRODISCECTOMY Left 04/10/2015   Procedure: Left L5-S1 Microdiscectomy;  Surgeon: Marybelle Killings, MD;  Location: [facility] [facility];  Service: Orthopedics;  Laterality: Left;  Marland Kitchen VAGINAL HYSTERECTOMY N/A 06/04/2020    Procedure: HYSTERECTOMY VAGINAL, MORCELLATION;  Surgeon: Chancy Milroy, MD;  Location: [facility];  Service: Gynecology;  Laterality: N/A;  . WISDOM TOOTH EXTRACTION     Social History   Occupational History  . Not on file  Tobacco Use  . Smoking status: Current Every Day Smoker    Packs/day: 0.25    Years: 16.00    Pack years: 4.00    Types: Cigarettes  . Smokeless tobacco: Never Used  Vaping Use  . Vaping Use: Never used  Substance and Sexual Activity  . Alcohol use: Yes    Comment: occ  . Drug use: Yes    Types: Marijuana    Comment: occ  . Sexual activity: Not on file

## 2020-10-30 NOTE — Addendum Note (Signed)
Addended by: Meyer Cory on: 10/30/2020 09:50 AM   Modules accepted: Orders

## 2020-11-13 ENCOUNTER — Ambulatory Visit: Payer: 59 | Admitting: Orthopaedic Surgery

## 2020-11-28 ENCOUNTER — Ambulatory Visit: Payer: Self-pay

## 2020-11-28 ENCOUNTER — Encounter: Payer: Self-pay | Admitting: Physical Medicine and Rehabilitation

## 2020-11-28 ENCOUNTER — Other Ambulatory Visit: Payer: Self-pay

## 2020-11-28 ENCOUNTER — Ambulatory Visit (INDEPENDENT_AMBULATORY_CARE_PROVIDER_SITE_OTHER): Payer: 59 | Admitting: Physical Medicine and Rehabilitation

## 2020-11-28 VITALS — BP 167/100 | HR 74

## 2020-11-28 DIAGNOSIS — M5416 Radiculopathy, lumbar region: Secondary | ICD-10-CM | POA: Diagnosis not present

## 2020-11-28 MED ORDER — DEXAMETHASONE SODIUM PHOSPHATE 10 MG/ML IJ SOLN
15.0000 mg | Freq: Once | INTRAMUSCULAR | Status: AC
  Administered 2020-11-28: 15 mg

## 2020-11-28 NOTE — Progress Notes (Signed)
Pt state lower back pain that travels down her left leg. Pt states she feels numbness and tingling around her left knee area down to her foot. Pt state some times she loses her balances. Pt state she takes pain meds and ice to help ease her pain.  Numeric Pain Rating Scale and Functional Assessment Average Pain 9   In the last MONTH (on 0-10 scale) has pain interfered with the following?  1. General activity like being  able to carry out your everyday physical activities such as walking, climbing stairs, carrying groceries, or moving a chair?  Rating(10)   +Driver, -BT, -Dye Allergies.

## 2020-12-04 ENCOUNTER — Other Ambulatory Visit: Payer: Self-pay | Admitting: Obstetrics and Gynecology

## 2020-12-04 DIAGNOSIS — Z1231 Encounter for screening mammogram for malignant neoplasm of breast: Secondary | ICD-10-CM

## 2020-12-25 ENCOUNTER — Encounter: Payer: Self-pay | Admitting: Nurse Practitioner

## 2020-12-25 ENCOUNTER — Other Ambulatory Visit: Payer: Self-pay

## 2020-12-25 ENCOUNTER — Ambulatory Visit: Payer: 59 | Admitting: Nurse Practitioner

## 2020-12-25 VITALS — BP 132/80 | HR 91 | Temp 97.6°F | Ht 62.5 in | Wt 199.2 lb

## 2020-12-25 DIAGNOSIS — G8929 Other chronic pain: Secondary | ICD-10-CM

## 2020-12-25 DIAGNOSIS — E559 Vitamin D deficiency, unspecified: Secondary | ICD-10-CM

## 2020-12-25 DIAGNOSIS — M545 Low back pain, unspecified: Secondary | ICD-10-CM

## 2020-12-25 DIAGNOSIS — Z6836 Body mass index (BMI) 36.0-36.9, adult: Secondary | ICD-10-CM | POA: Diagnosis not present

## 2020-12-25 DIAGNOSIS — I10 Essential (primary) hypertension: Secondary | ICD-10-CM | POA: Diagnosis not present

## 2020-12-25 DIAGNOSIS — R7303 Prediabetes: Secondary | ICD-10-CM

## 2020-12-25 MED ORDER — RYBELSUS 7 MG PO TABS: 1.0000 | ORAL_TABLET | Freq: Every day | ORAL | 0 refills | Status: DC

## 2020-12-25 NOTE — Progress Notes (Signed)
I,Yamilka Roman [employer] as a Education administrator for [employer], FNP.,have documented all relevant documentation on the behalf of Minette Brine, FNP,as directed by  Minette Brine, FNP while in the presence of Minette Brine, [place]. This visit occurred during the SARS-CoV-2 public health emergency.  Safety protocols were in place, including screening questions prior to the visit, additional usage of staff PPE, and extensive cleaning of exam room while observing appropriate contact time as indicated for disinfecting solutions.  Subjective:     Patient ID: Meredith York , female    DOB: [birth year] , 45 y.o.   MRN: [id number]   Chief Complaint  Patient presents with   Prediabetes   Hypertension    HPI  Patient presents today for a f/u on her prediabetes and blood pressure.   Wt Readings from Last 3 Encounters: 12/25/20 : 199 lb 3.2 oz (90.4 kg) 10/30/20 : 207 lb (93.9 kg) 10/10/20 : 207 lb 12.8 oz (94.3 kg)  She has cut back on some bad foods, decrease salt intake and cut back on calories.  More walking and she is drinking more water.   She has a herniated disc that Dr. Lorin Mercy is managing, she had an injection 12/9 but the pain reoccured on Jan 1  Hypertension This is a chronic problem. The current episode started more than 1 year ago. The problem is controlled. Pertinent negatives include no anxiety, chest pain or palpitations. There are no associated agents to hypertension. Risk factors for coronary artery disease include obesity and sedentary lifestyle.     Past Medical History:  Diagnosis Date   Carpal tunnel syndrome on right    Chlamydia    Gestational diabetes mellitus    Hypertension    no medicaqtions at this time   Migraines    Ovarian cyst    S/P lumbar discectomy 04/10/2015   Tendonitis    right wrist   Trichomonas    Type 2 diabetes mellitus ([place])    not currently taking medication     Family History  Problem Relation Age of Onset   Hypertension Maternal  Aunt    Hypertension Paternal 12    Club foot Daughter    Hirschsprung's disease Daughter    Learning disabilities Daughter    Polydactyly Daughter    Hypertension Mother    Diabetes Mother    Heart disease Father    Anesthesia problems Neg Hx    Hypotension Neg Hx    Malignant hyperthermia Neg Hx    Pseudochol deficiency Neg Hx      Current Outpatient Medications:    hydrochlorothiazide (HYDRODIURIL) 25 MG tablet, Take 1 tablet (25 mg total) by mouth daily., Disp: 90 tablet, Rfl: 1   methocarbamol (ROBAXIN) 500 MG tablet, Take 1 tablet (500 mg total) by mouth every 8 (eight) hours as needed for muscle spasms., Disp: 40 tablet, Rfl: 0   Semaglutide (RYBELSUS) 7 MG TABS, Take 1 tablet by mouth daily. Take 30 minutes before breakfast, Disp: 90 tablet, Rfl: 0   traMADol (ULTRAM) 50 MG tablet, Take 1 tablet (50 mg total) by mouth every 6 (six) hours as needed., Disp: 30 tablet, Rfl: 0  Current Facility-Administered Medications:    dexamethasone (DECADRON) injection 15 mg, 15 mg, Other, Once, Magnus Sinning, MD   Allergies  Allergen Reactions   Amoxicillin Hives and Itching    Has patient had a PCN reaction causing immediate rash, facial/tongue/throat swelling, SOB or lightheadedness with hypotension: No Has patient had a PCN reaction causing severe rash involving mucus  membranes or skin necrosis: No Has patient had a PCN reaction that required hospitalization No Has patient had a PCN reaction occurring within the last 10 years: No If all of the above answers are "NO", then may proceed with Cephalosporin use.    Percocet [Oxycodone-Acetaminophen] Itching     Review of Systems  Constitutional: Negative.   Respiratory: Negative.   Cardiovascular: Negative.  Negative for chest pain, palpitations and leg swelling.  Endocrine: Negative for polydipsia, polyphagia and polyuria.  Psychiatric/Behavioral: Negative.      Today's Vitals   12/25/20 1014  BP: 132/80   Pulse: 91  Temp: 97.6 F (36.4 C)  TempSrc: Oral  Weight: 199 lb 3.2 oz (90.4 kg)  Height: 5' 2.5" (1.588 m)  PainSc: 8   PainLoc: Back   Body mass index is 35.85 kg/m.   Objective:  Physical Exam Vitals reviewed.  Constitutional:      General: She is not in acute distress.    Appearance: Normal appearance. She is obese.  Cardiovascular:     Rate and Rhythm: Normal rate and regular rhythm.     Pulses: Normal pulses.     Heart sounds: Normal heart sounds. No murmur heard.   Pulmonary:     Effort: Pulmonary effort is normal. No respiratory distress.     Breath sounds: Normal breath sounds. No wheezing.  Neurological:     General: No focal deficit present.     Mental Status: She is alert and oriented to person, place, and time.     Cranial Nerves: No cranial nerve deficit.  Psychiatric:        Mood and Affect: Mood normal.        Behavior: Behavior normal.        Thought Content: Thought content normal.        Judgment: Judgment normal.         Assessment And Plan:     1. Prediabetes  Chronic, controlled  Continue with current medications  Encouraged to limit intake of sugary foods and drinks  Encouraged to increase physical activity to 150 minutes per week - Semaglutide (RYBELSUS) 7 MG TABS; Take 1 tablet by mouth daily. Take 30 minutes before breakfast  Dispense: 90 tablet; Refill: 0 - Hemoglobin A1c - BMP8+eGFR  2. Primary hypertension  She is stable.  Continue with low salt diet  - BMP8+eGFR  3. BMI 36.0-36.9,adult  Congratulated her on the 8 lb weight loss  Continue with eating a healthy diet low in sugar and starch  4. Vitamin D deficiency  Will check vitamin D level and supplement as needed.     Also encouraged to spend 15 minutes in the sun daily.  - VITAMIN D 25 Hydroxy (Vit-D Deficiency, Fractures)   5. Chronic bilateral low back pain without sciatica  She is being followed by Dr. Lorin Mercy and she reports she has a herniated  disc  She will be calling him back since she is having worsening pain since her injection   Patient was given opportunity to ask questions. Patient verbalized understanding of the plan and was able to repeat key elements of the plan. All questions were answered to their satisfaction.  Minette Brine, FNP   I, Minette Brine, FNP, have reviewed all documentation for this visit. The documentation on 12/25/20 for the exam, diagnosis, procedures, and orders are all accurate and complete.   THE PATIENT IS ENCOURAGED TO PRACTICE SOCIAL DISTANCING DUE TO THE COVID-19 PANDEMIC.

## 2020-12-25 NOTE — Patient Instructions (Signed)

## 2020-12-26 LAB — BMP8+EGFR
BUN/Creatinine Ratio: 9 (ref 9–23)
BUN: 6 mg/dL (ref 6–24)
CO2: 24 mmol/L (ref 20–29)
Calcium: 10.1 mg/dL (ref 8.7–10.2)
Chloride: 99 mmol/L (ref 96–106)
Creatinine, Ser: 0.67 mg/dL (ref 0.57–1.00)
GFR calc Af Amer: 125 mL/min/{1.73_m2} (ref >59)
GFR calc non Af Amer: 108 mL/min/{1.73_m2} (ref >59)
Glucose: 85 mg/dL (ref 65–99)
Potassium: 4.5 mmol/L (ref 3.5–5.2)
Sodium: 138 mmol/L (ref 134–144)

## 2020-12-26 LAB — HEMOGLOBIN A1C
Est. average glucose Bld gHb Est-mCnc: 134 mg/dL
Hgb A1c MFr Bld: 6.3 % — ABNORMAL HIGH (ref 4.8–5.6)

## 2020-12-26 LAB — VITAMIN D 25 HYDROXY (VIT D DEFICIENCY, FRACTURES): Vit D, 25-Hydroxy: 9.1 ng/mL — ABNORMAL LOW (ref 30.0–100.0)

## 2020-12-29 ENCOUNTER — Other Ambulatory Visit: Payer: Self-pay | Admitting: Surgery

## 2020-12-29 ENCOUNTER — Other Ambulatory Visit: Payer: Self-pay

## 2020-12-29 ENCOUNTER — Encounter (HOSPITAL_BASED_OUTPATIENT_CLINIC_OR_DEPARTMENT_OTHER): Payer: Self-pay

## 2020-12-29 ENCOUNTER — Emergency Department (HOSPITAL_BASED_OUTPATIENT_CLINIC_OR_DEPARTMENT_OTHER)
Admission: EM | Admit: 2020-12-29 | Discharge: 2020-12-29 | Disposition: A | Discharge status: Home / Self Care | Payer: 59 | Attending: Emergency Medicine | Admitting: Emergency Medicine

## 2020-12-29 DIAGNOSIS — Z79899 Other long term (current) drug therapy: Secondary | ICD-10-CM | POA: Insufficient documentation

## 2020-12-29 DIAGNOSIS — M79652 Pain in left thigh: Secondary | ICD-10-CM | POA: Diagnosis not present

## 2020-12-29 DIAGNOSIS — F17210 Nicotine dependence, cigarettes, uncomplicated: Secondary | ICD-10-CM | POA: Diagnosis not present

## 2020-12-29 DIAGNOSIS — I10 Essential (primary) hypertension: Secondary | ICD-10-CM | POA: Diagnosis not present

## 2020-12-29 DIAGNOSIS — M545 Low back pain, unspecified: Secondary | ICD-10-CM | POA: Diagnosis present

## 2020-12-29 DIAGNOSIS — M5416 Radiculopathy, lumbar region: Secondary | ICD-10-CM | POA: Diagnosis not present

## 2020-12-29 DIAGNOSIS — E119 Type 2 diabetes mellitus without complications: Secondary | ICD-10-CM | POA: Insufficient documentation

## 2020-12-29 DIAGNOSIS — F1590 Other stimulant use, unspecified, uncomplicated: Secondary | ICD-10-CM | POA: Insufficient documentation

## 2020-12-29 MED ORDER — CYCLOBENZAPRINE HCL 10 MG PO TABS: 10.0000 mg | ORAL_TABLET | Freq: Two times a day (BID) | ORAL | 0 refills | Status: DC | PRN

## 2020-12-29 MED ORDER — PREDNISONE 20 MG PO TABS: ORAL_TABLET | ORAL | 0 refills | Status: DC

## 2020-12-29 MED ORDER — KETOROLAC TROMETHAMINE 15 MG/ML IJ SOLN
15.0000 mg | Freq: Once | INTRAMUSCULAR | Status: AC
  Administered 2020-12-29: 15 mg via INTRAMUSCULAR
  Filled 2020-12-29: qty 1

## 2020-12-29 MED ORDER — DEXAMETHASONE SODIUM PHOSPHATE 10 MG/ML IJ SOLN
10.0000 mg | Freq: Once | INTRAMUSCULAR | Status: AC
  Administered 2020-12-29: 10 mg via INTRAMUSCULAR
  Filled 2020-12-29: qty 1

## 2020-12-29 NOTE — ED Notes (Signed)
DC teaching provided to client, AVS reviewed with pt and copy provided, pt teaching done re: the two  (2) Rx's by the ED provider. Discussed safety while taking PO muscle relaxants and importance of tapering steroids that were ordered.

## 2020-12-29 NOTE — Discharge Instructions (Signed)
Please read and follow all provided instructions.  Your diagnoses today include:  1. Lumbar radiculopathy, acute     Tests performed today include:  Vital signs - see below for your results today  Medications prescribed:   Prednisone - steroid medicine   It is best to take this medication in the morning to prevent sleeping problems. If you are diabetic, monitor your blood sugar closely and stop taking Prednisone if blood sugar is over 300. Take with food to prevent stomach upset.    Flexeril (cyclobenzaprine) - muscle relaxer medication  DO NOT drive or perform any activities that require you to be awake and alert because this medicine can make you drowsy.   Take any prescribed medications only as directed.  Home care instructions:   Follow any educational materials contained in this packet  Please rest, use ice or heat on your back for the next several days  Do not lift, push, pull anything more than 10 pounds for the next week  Follow-up instructions: Please follow-up with your primary care provider in the next 1 week for further evaluation of your symptoms.   Return instructions:  SEEK IMMEDIATE MEDICAL ATTENTION IF YOU HAVE:  New numbness, tingling, weakness, or problem with the use of your arms or legs  Severe back pain not relieved with medications  Loss control of your bowels or bladder  Increasing pain in any areas of the body (such as chest or abdominal pain)  Shortness of breath, dizziness, or fainting.   Worsening nausea (feeling sick to your stomach), vomiting, fever, or sweats  Any other emergent concerns regarding your health   Additional Information:  Your vital signs today were: BP (!) 159/101 (BP Location: Right Arm)    Pulse 85    Temp 98.5 F (36.9 C) (Oral)    Resp 18    Ht 5' 2.5" (1.588 m)    Wt 90.4 kg    LMP  (LMP Unknown)    SpO2 100%    BMI 35.85 kg/m  If your blood pressure (BP) was elevated above 135/85 this visit, please have this  repeated by your doctor within one month. --------------

## 2020-12-29 NOTE — ED Triage Notes (Signed)
Pt arrive c/o lower back pain & pain down left leg with muscle spasms. Pain started in September and shooting down left leg. Had injection in back on 12/9. Woke up on 1/1 with back pain again. Hx of herniated disc with surgery. Spasms occur with movement. Been taking tramadol/muscle relaxers without relief. Slow steady gait to triage

## 2020-12-29 NOTE — ED Provider Notes (Signed)
[facility] EMERGENCY DEPARTMENT Provider Note   CSN: [id number] Arrival date & time: 12/29/20  1141     History Chief Complaint  Patient presents with   Back Pain    Meredith York is a 45 y.o. female.  Patient with history of herniated disc in her back presents the emergency department for evaluation of left-sided back pain with radiation of pain down into her posterior left thigh with associated muscle spasms.  Patient states that she received a spinal injection in early December for this.  Symptoms were improved until about New Year's Day when the pain returned.  She developed muscle spasms about 3 days ago which have been getting worse. Patient denies warning symptoms of back pain including: fecal incontinence, urinary retention or overflow incontinence, night sweats, waking from sleep with back pain, unexplained fevers or weight loss, h/o cancer, recent trauma.  Pain is similar to previous episodes.  She states that she has had benefit from Toradol shots in the past.         Past Medical History:  Diagnosis Date   Carpal tunnel syndrome on right    Chlamydia    Gestational diabetes mellitus    Hypertension    no medicaqtions at this time   Migraines    Ovarian cyst    S/P lumbar discectomy 04/10/2015   Tendonitis    right wrist   Trichomonas    Type 2 diabetes mellitus ([place])    not currently taking medication    Patient Active Problem List   Diagnosis Date Noted   Recurrent herniation of lumbar disc 10/30/2020   Prediabetes 09/24/2020   Post-operative state 06/04/2020   Visit for routine gyn exam 11/29/2019   Encounter for screening mammogram for breast cancer 11/29/2019   History of bilateral tubal ligation 11/29/2019   S/P cesarean section 07/14/2016   Hypertension 07/05/2011    Past Surgical History:  Procedure Laterality Date   ABDOMINAL HYSTERECTOMY     BACK SURGERY     CESAREAN SECTION  12/27/2011   Procedure: CESAREAN  SECTION;  Surgeon: Shelly Bombard, MD;  Location: [facility];  Service: Gynecology;  Laterality: N/A;  Primary cesarean section with delivery of baby girl at 28. Apgars 3/3/6   CESAREAN SECTION N/A 08/03/2016   Procedure: CESAREAN SECTION;  Surgeon: Osborne Oman, MD;  Location: [facility];  Service: Obstetrics;  Laterality: N/A;   CHOLECYSTECTOMY  02/2009   DILATION AND CURETTAGE OF UTERUS N/A 06/25/2014   Procedure: DILATATION AND CURETTAGE;  Surgeon: Donnamae Jude, MD;  Location: [facility];  Service: Gynecology;  Laterality: N/A;   LUMBAR LAMINECTOMY/DECOMPRESSION MICRODISCECTOMY Left 04/10/2015   Procedure: Left L5-S1 Microdiscectomy;  Surgeon: Marybelle Killings, MD;  Location: [facility];  Service: Orthopedics;  Laterality: Left;   VAGINAL HYSTERECTOMY N/A 06/04/2020   Procedure: HYSTERECTOMY VAGINAL, MORCELLATION;  Surgeon: Chancy Milroy, MD;  Location: [facility];  Service: Gynecology;  Laterality: N/A;   WISDOM TOOTH EXTRACTION       OB History    Gravida  6   Para  4   Term  3   Preterm  1   AB  2   Living  3     SAB  1   IAB  1   Ectopic      Multiple  0   Live Births  4           Family History  Problem Relation Age of Onset   Hypertension  Maternal Aunt    Hypertension Paternal Pensions consultant Daughter    Hirschsprung's disease Daughter    Learning disabilities Daughter    Polydactyly Daughter    Hypertension Mother    Diabetes Mother    Heart disease Father    Anesthesia problems Neg Hx    Hypotension Neg Hx    Malignant hyperthermia Neg Hx    Pseudochol deficiency Neg Hx     Social History   Tobacco Use   Smoking status: Current Every Day Smoker    Packs/day: 0.25    Years: 16.00    Pack years: 4.00    Types: Cigarettes   Smokeless tobacco: Never Used  Scientific laboratory technician Use: Never used  Substance Use Topics   Alcohol use: Yes    Comment: occ   Drug use: Yes    Types: Marijuana    Comment:  occ    Home Medications Prior to Admission medications   Medication Sig Start Date End Date Taking? Authorizing Provider  hydrochlorothiazide (HYDRODIURIL) 25 MG tablet Take 1 tablet (25 mg total) by mouth daily. 09/24/20   Minette Brine, FNP  methocarbamol (ROBAXIN) 500 MG tablet Take 1 tablet (500 mg total) by mouth every 8 (eight) hours as needed for muscle spasms. 10/10/20   Lanae Crumbly, PA-C  Semaglutide (RYBELSUS) 7 MG TABS Take 1 tablet by mouth daily. Take 30 minutes before breakfast 12/25/20   Minette Brine, FNP  traMADol (ULTRAM) 50 MG tablet Take 1 tablet (50 mg total) by mouth every 6 (six) hours as needed. 10/10/20   Lanae Crumbly, PA-C    Allergies    Amoxicillin and Percocet [oxycodone-acetaminophen]  Review of Systems   Review of Systems  Constitutional: Negative for fever and unexpected weight change.  Gastrointestinal: Negative for constipation.       Negative for fecal incontinence.   Genitourinary: Negative for dysuria and flank pain.       Negative for urinary incontinence or retention.  Musculoskeletal: Positive for back pain and myalgias.  Neurological: Negative for weakness and numbness.       Denies saddle paresthesias.    Physical Exam Updated Vital Signs BP (!) 167/110 (BP Location: Right Arm)    Pulse (!) 110    Temp 98.3 F (36.8 C) (Oral)    Resp (!) 24    Ht 5' 2.5" (1.588 m)    Wt 90.4 kg    LMP  (LMP Unknown)    SpO2 100%    BMI 35.85 kg/m   Physical Exam Vitals and nursing note reviewed.  Constitutional:      Appearance: She is well-developed and well-nourished. She is ill-appearing (Patient appears uncomfortable).  HENT:     Head: Normocephalic and atraumatic.  Eyes:     Conjunctiva/sclera: Conjunctivae normal.  Pulmonary:     Effort: Pulmonary effort is normal.  Abdominal:     Palpations: Abdomen is soft.     Tenderness: There is no abdominal tenderness. There is no CVA tenderness.  Musculoskeletal:        General: Tenderness  present. Normal range of motion.     Cervical back: Normal range of motion and neck supple.     Comments: No step-off noted with palpation of spine.  Tenderness over the left hamstrings.  Skin:    General: Skin is warm and dry.     Findings: No rash.  Neurological:     Mental Status: She is alert.  Sensory: No sensory deficit.     Deep Tendon Reflexes: Strength normal and reflexes are normal and symmetric.     Comments: 5 out of 5 strength in right lower extremity.  Patient was slightly weak dorsiflexion and left lower extremity, this motion seems to make the pain worse.  Near normal plantar flexion.  Psychiatric:        Mood and Affect: Mood and affect normal.     ED Results / Procedures / Treatments   Labs (all labs ordered are listed, but only abnormal results are displayed) Labs Reviewed - No data to display  EKG None  Radiology No results found.  Procedures Procedures (including critical care time)  Medications Ordered in ED Medications  ketorolac (TORADOL) 15 MG/ML injection 15 mg (15 mg Intramuscular Given 12/29/20 1323)  dexamethasone (DECADRON) injection 10 mg (10 mg Intramuscular Given 12/29/20 1323)    ED Course  I have reviewed the triage vital signs and the nursing notes.  Pertinent labs & imaging results that were available during my care of the patient were reviewed by me and considered in my medical decision making (see chart for details).  1:09 PM Patient seen and examined. Work-up initiated. Medications ordered. Will give IM toradol, decadron.   Vital signs reviewed and are as follows: Vitals:   12/29/20 1201 12/29/20 1205  BP: (!) 167/110   Pulse: (!) 110   Resp: (!) 24   Temp:  98.3 F (36.8 C)  SpO2: 100%    2:30 PM patient states that she is starting to feel better.  She looks more comfortable.  She is comfortable discharged home at this time.  Plan for discharge with steroid taper and Flexeril.  Encouraged follow-up with her spine providers.    No red flag s/s of low back pain. Patient was counseled on back pain precautions and told to do activity as tolerated but do not lift, push, or pull heavy objects more than 10 pounds for the next week.  Patient counseled to use ice or heat on back for no longer than 15 minutes every hour.   Patient counseled on proper use of muscle relaxant medication.  They were told not to drink alcohol, drive any vehicle, or do any dangerous activities while taking this medication.  Patient verbalized understanding.  The patient verbalizes understanding and agrees with the plan.    MDM Rules/Calculators/A&P                          Patient with back pain with radicular features. No neurological deficits. Patient is ambulatory. No warning symptoms of back pain including: fecal incontinence, urinary retention or overflow incontinence, night sweats, waking from sleep with back pain, unexplained fevers or weight loss, h/o cancer, IVDU, recent trauma. No concern for cauda equina, epidural abscess, or other serious cause of back pain. Conservative measures such as rest, ice/heat and pain medicine indicated with PCP follow-up if no improvement with conservative management.   Final Clinical Impression(s) / ED Diagnoses Final diagnoses:  Lumbar radiculopathy, acute    Rx / DC Orders ED Discharge Orders         Ordered    predniSONE (DELTASONE) 20 MG tablet        12/29/20 1428    cyclobenzaprine (FLEXERIL) 10 MG tablet  2 times daily PRN        12/29/20 1428           Carlisle Cater, PA-C 12/29/20 1431  Gareth Morgan, MD 12/31/20 1506

## 2020-12-30 ENCOUNTER — Other Ambulatory Visit: Payer: Self-pay | Admitting: Orthopaedic Surgery

## 2020-12-30 NOTE — Telephone Encounter (Signed)
MY pt 

## 2020-12-30 NOTE — Telephone Encounter (Signed)
I called patient and advised. She requested sooner appt as she has had to go to ED for severe back spasms. Follow up appt made for 01/01/2021.

## 2020-12-30 NOTE — Telephone Encounter (Signed)
Please advise 

## 2020-12-30 NOTE — Telephone Encounter (Signed)
OK refill both, needs ROV in a few weeks.

## 2021-01-01 ENCOUNTER — Other Ambulatory Visit: Payer: Self-pay

## 2021-01-01 ENCOUNTER — Encounter: Payer: Self-pay | Admitting: Orthopaedic Surgery

## 2021-01-01 ENCOUNTER — Ambulatory Visit: Payer: 59 | Admitting: Orthopaedic Surgery

## 2021-01-01 VITALS — BP 149/99 | HR 83 | Ht 62.5 in | Wt 194.0 lb

## 2021-01-01 DIAGNOSIS — M5126 Other intervertebral disc displacement, lumbar region: Secondary | ICD-10-CM

## 2021-01-01 MED ORDER — GABAPENTIN 100 MG PO CAPS: ORAL_CAPSULE | ORAL | 1 refills | Status: DC

## 2021-01-01 NOTE — Progress Notes (Signed)
Meredith York - 45 y.o. female MRN [id number]  Date of birth: 02-12-1976  Office Visit Note: Visit Date: 11/28/2020 PCP: Minette Brine, FNP Referred by: Minette Brine, FNP  Subjective: Chief Complaint  Patient presents with  . Lower Back - Pain  . Left Knee - Numbness  . Left Foot - Numbness  . Left Leg - Pain   HPI:  Meredith York is a 45 y.o. female who comes in today at the request of Dr. Rodell Perna for planned Left L4-L5 Lumbar epidural steroid injection with fluoroscopic guidance.  The patient has failed conservative care including home exercise, medications, time and activity modification.  This injection will be diagnostic and hopefully therapeutic.  Please see requesting physician notes for further details and justification.   ROS Otherwise per HPI.  Assessment & Plan: Visit Diagnoses:    ICD-10-CM   1. Lumbar radiculopathy  M54.16 XR C-ARM NO REPORT    Epidural Steroid injection    dexamethasone (DECADRON) injection 15 mg    Plan: No additional findings.   Meds & Orders:  Meds ordered this encounter  Medications  . dexamethasone (DECADRON) injection 15 mg    Orders Placed This Encounter  Procedures  . XR C-ARM NO REPORT  . Epidural Steroid injection    Follow-up: Return for Rodell Perna, MD.   Procedures: No procedures performed  Lumbosacral Transforaminal Epidural Steroid Injection - Sub-Pedicular Approach with Fluoroscopic Guidance  Patient: Meredith York      Date of Birth: 04-Jul-1976 MRN: [id number] PCP: Minette Brine, FNP      Visit Date: 11/28/2020   Universal Protocol:    Date/Time: 11/28/2020  Consent Given By: the patient  Position: PRONE  Additional Comments: Vital signs were monitored before and after the procedure. Patient was prepped and draped in the usual sterile fashion. The correct patient, procedure, and site was verified.   Injection Procedure Details:   Procedure diagnoses: Lumbar radiculopathy [M54.16]    Meds  Administered:  Meds ordered this encounter  Medications  . dexamethasone (DECADRON) injection 15 mg    Laterality: Left  Location/Site:  L4-L5  Needle:5.0 in., 22 ga.  Short bevel or Quincke spinal needle  Needle Placement: Transforaminal  Findings:    -Comments: Excellent flow of contrast along the nerve, nerve root and into the epidural space.  Procedure Details: After squaring off the end-plates to get a true AP view, the C-arm was positioned so that an oblique view of the foramen as noted above was visualized. The target area is just inferior to the "nose of the scotty dog" or sub pedicular. The soft tissues overlying this structure were infiltrated with 2-3 ml. of 1% Lidocaine without Epinephrine.  The spinal needle was inserted toward the target using a "trajectory" view along the fluoroscope beam.  Under AP and lateral visualization, the needle was advanced so it did not puncture dura and was located close the 6 O'Clock position of the pedical in AP tracterory. Biplanar projections were used to confirm position. Aspiration was confirmed to be negative for CSF and/or blood. A 1-2 ml. volume of Isovue-250 was injected and flow of contrast was noted at each level. Radiographs were obtained for documentation purposes.   After attaining the desired flow of contrast documented above, a 0.5 to 1.0 ml test dose of 0.25% Marcaine was injected into each respective transforaminal space.  The patient was observed for 90 seconds post injection.  After no sensory deficits were reported, and normal lower extremity motor function was noted,  the above injectate was administered so that equal amounts of the injectate were placed at each foramen (level) into the transforaminal epidural space.   Additional Comments:  The patient tolerated the procedure well Dressing: 2 x 2 sterile gauze and Band-Aid    Post-procedure details: Patient was observed during the procedure. Post-procedure  instructions were reviewed.  Patient left the clinic in stable condition.      Clinical History: MRI LUMBAR SPINE WITHOUT AND WITH CONTRAST  TECHNIQUE: Multiplanar and multiecho pulse sequences of the lumbar spine were obtained without and with intravenous contrast.  CONTRAST:  48mL MULTIHANCE GADOBENATE DIMEGLUMINE 529 MG/ML IV SOLN  COMPARISON:  Radiographs of the lumbar spine 09/11/2020. lumbar spine MRI 01/29/2015.  FINDINGS: Segmentation: For the purposes of this dictation, five lumbar vertebrae are assumed and the caudal most well-formed intervertebral disc is designated L5-S1.  Alignment: Straightening of the expected lumbar lordosis. No significant spondylolisthesis.  Vertebrae: Vertebral body height is maintained. No focal suspicious osseous lesion or significant marrow edema.  Conus medullaris and cauda equina: Conus extends to the L1-L2 level. No signal abnormality within the visualized distal spinal cord.  Paraspinal and other soft tissues: No abnormality identified within included portions of the abdomen/retroperitoneum. Postsurgical changes to the lower dorsal lumbar soft tissues.  Disc levels:  Unless otherwise stated, the level by level findings below have not significantly changed since prior MRI 01/29/2015  Mild L5-S1 disc degeneration. Disc height and hydration are maintained at the remaining lumbar levels.  There is no significant disc herniation, spinal canal stenosis or neural foraminal narrowing at the T12-L1 through L2-L3 levels.  L3-L4: Mild facet arthrosis on the left. Minimal relative left subarticular narrowing without nerve root impingement. Central canal patent. No significant foraminal stenosis.  L4-L5: New from the prior MRI, there is a left foraminal/extraforaminal disc extrusion (series 5, image 28) (series 8, image 28) (series 2, image 12). Mild facet arthrosis/ligamentum flavum hypertrophy (greater on the left).  Minimal relative left subarticular narrowing without nerve root impingement. Central canal patent. Progressive moderate left foraminal stenosis with the disc extrusion contacting the exiting left L4 nerve root within and beyond the left neural foramen (series 2, image 12) (series 5, image 28). No significant foraminal stenosis on the right.  L5-S1: Since the prior MRI, there is been interval left hemilaminotomy and discectomy. Disc bulge with endplate spurring. Residual/recurrent shallow broad-based central disc protrusion. Facet arthrosis (greater on the left). The residual/recurrent disc protrusion contributes to moderate left and mild right subarticular narrowing with contact upon the left greater than right descending S1 nerve roots (series 4, image 34). Central canal patent. Mild/moderate bilateral neural foraminal narrowing, progressed.  IMPRESSION: Comparison is made to the prior MRI of 08/30/2015. Lumbar spondylosis and postsurgical changes as outlined and with findings most notably as follows.  At L4-L5, there is a new left foraminal/extraforaminal disc extrusion. Mild facet arthrosis/ligamentum flavum hypertrophy. Progressive moderate left foraminal stenosis with the disc extrusion contacting the exiting left L4 nerve root within and beyond the left neural foramen. Minimal relative left subarticular narrowing without nerve root impingement.  At L5-S1, there has been interval left hemilaminotomy and discectomy. Mild disc degeneration. Disc bulge with endplate spurring. Residual/recurrent shallow broad-based central disc protrusion. Facet arthrosis (greater on the left). The residual/recurrent disc protrusion contributes to moderate left and mild right subarticular narrowing with contact upon the left greater than right descending S1 nerve roots. Mild/moderate bilateral neural foraminal narrowing, progressed.   Electronically Signed   By: Kellie Simmering DO  On:  10/22/2020 14:45     Objective:  VS:  HT:    WT:   BMI:     BP:(!) 167/100  HR:74bpm  TEMP: ( )  RESP:  Physical Exam Vitals and nursing note reviewed.  Constitutional:      General: She is not in acute distress.    Appearance: Normal appearance. She is not ill-appearing.  HENT:     Head: Normocephalic and atraumatic.     Right Ear: External ear normal.     Left Ear: External ear normal.  Eyes:     Extraocular Movements: Extraocular movements intact.  Cardiovascular:     Rate and Rhythm: Normal rate.     Pulses: Normal pulses.  Pulmonary:     Effort: Pulmonary effort is normal. No respiratory distress.  Abdominal:     General: There is no distension.     Palpations: Abdomen is soft.  Musculoskeletal:        General: Tenderness present.     Cervical back: Neck supple.     Right lower leg: No edema.     Left lower leg: No edema.     Comments: Patient has good distal strength with no pain over the greater trochanters.  No clonus or focal weakness. Positive left slump.  Skin:    Findings: No erythema, lesion or rash.  Neurological:     General: No focal deficit present.     Mental Status: She is alert and oriented to person, place, and time.     Sensory: No sensory deficit.     Motor: No weakness or abnormal muscle tone.     Coordination: Coordination normal.  Psychiatric:        Mood and Affect: Mood normal.        Behavior: Behavior normal.      Imaging: No results found.

## 2021-01-01 NOTE — Progress Notes (Signed)
Office Visit Note   Patient: Meredith York           Date of Birth: 04-27-1976           MRN: [id number] Visit Date: 01/01/2021              Requested by: Minette Brine, [place] [address] [place],  [place] [address] PCP: Minette Brine, FNP   Assessment & Plan: Visit Diagnoses:  1. Recurrent herniation of lumbar disc     Plan: We reviewed her MRI scan from 10/22/2020 and compared to images 2016.  All of the disc above L5-S1 are normal.  She has recurrent disc protrusion but not as large as it was at the time of surgery in 2016.  We will set up for second injection.  If this does not give her relief when she returns we can consider possible repeat microdiscectomy.  She has gotten better with the injection hopefully the second injection may settle her symptoms down and she will not require any further treatment.  She will try to avoid lifting and bending as best she can while she still takes care of her children and I will recheck her after the epidural.  We will start some Neurontin 100 mg at night for 1 week then she can go to 2 tablets at night.  She can discontinue the Flexeril.  Follow-Up Instructions: No follow-ups on file.   Orders:  Orders Placed This Encounter  Procedures  . Ambulatory referral to Physical Medicine Rehab   Meds ordered this encounter  Medications  . gabapentin (NEURONTIN) 100 MG capsule    Sig: Take one at night time 1 week then 2 po q HS    Dispense:  60 capsule    Refill:  1      Procedures: No procedures performed   Clinical Data: No additional findings.   Subjective: Chief Complaint  Patient presents with  . Lower Back - Pain    HPI 45 year old female returns with a left L5-S1 recurrent disc protrusion.  Previous surgery 2016 for large disc protrusion.  Did well until September 2021 when she had some recurrent symptoms.  Epidural ordered 11/28/2020 which gave her relief for 3 weeks.  She has gradually had recurrence of symptoms.   She has difficulty sitting does better standing.  She has had spasms in her back and her buttocks and her posterior thigh it stops at the knee.  Prior to the injection she had some pain in her lateral calf.  She has used Flexeril.  She had an IM steroid injection done elsewhere.  Toradol used as well as some Robaxin.  She has not been on any gabapentin.  Review of Systems 14 point system update unchanged from 10/30/2020.  Patient has lost about 20pounds.   Objective: Vital Signs: BP (!) 149/99   Pulse 83   Ht 5' 2.5" (1.588 m)   Wt 194 lb (88 kg)   LMP  (LMP Unknown)   BMI 34.92 kg/m   Physical Exam Constitutional:      Appearance: She is well-developed.  HENT:     Head: Normocephalic.     Right Ear: External ear normal.     Left Ear: External ear normal.  Eyes:     Pupils: Pupils are equal, round, and reactive to light.  Neck:     Thyroid: No thyromegaly.     Trachea: No tracheal deviation.  Cardiovascular:     Rate and Rhythm: Normal rate.  Pulmonary:  Effort: Pulmonary effort is normal.  Abdominal:     Palpations: Abdomen is soft.  Skin:    General: Skin is warm and dry.  Neurological:     Mental Status: She is alert and oriented to person, place, and time.  Psychiatric:        Mood and Affect: Mood and affect normal.        Behavior: Behavior normal.     Ortho Exam patient still able heel and toe pain with straight leg raising on the left at 70 degrees.  Distal pulses are intact foot dorsiflexion is normal.  She can still toe walk and heel walk.  Specialty Comments:  No specialty comments available.  Imaging: No results found.   PMFS History: Patient Active Problem List   Diagnosis Date Noted  . Recurrent herniation of lumbar disc 10/30/2020  . Prediabetes 09/24/2020  . Post-operative state 06/04/2020  . Visit for routine gyn exam 11/29/2019  . Encounter for screening mammogram for breast cancer 11/29/2019  . History of bilateral tubal ligation  11/29/2019  . S/P cesarean section 07/14/2016  . Hypertension 07/05/2011   Past Medical History:  Diagnosis Date  . Carpal tunnel syndrome on right   . Chlamydia   . Gestational diabetes mellitus   . Hypertension    no medicaqtions at this time  . Migraines   . Ovarian cyst   . S/P lumbar discectomy 04/10/2015  . Tendonitis    right wrist  . Trichomonas   . Type 2 diabetes mellitus (HCC)    not currently taking medication    Family History  Problem Relation Age of Onset  . Hypertension Maternal Aunt   . Hypertension Paternal Aunt   . Club foot Daughter   . Hirschsprung's disease Daughter   . Learning disabilities Daughter   . Polydactyly Daughter   . Hypertension Mother   . Diabetes Mother   . Heart disease Father   . Anesthesia problems Neg Hx   . Hypotension Neg Hx   . Malignant hyperthermia Neg Hx   . Pseudochol deficiency Neg Hx     Past Surgical History:  Procedure Laterality Date  . ABDOMINAL HYSTERECTOMY    . BACK SURGERY    . CESAREAN SECTION  12/27/2011   Procedure: CESAREAN SECTION;  Surgeon: Shelly Bombard, MD;  Location: [facility];  Service: Gynecology;  Laterality: N/A;  Primary cesarean section with delivery of baby girl at 59. Apgars 3/3/6  . CESAREAN SECTION N/A 08/03/2016   Procedure: CESAREAN SECTION;  Surgeon: Osborne Oman, MD;  Location: [facility];  Service: Obstetrics;  Laterality: N/A;  . CHOLECYSTECTOMY  02/2009  . DILATION AND CURETTAGE OF UTERUS N/A 06/25/2014   Procedure: DILATATION AND CURETTAGE;  Surgeon: Donnamae Jude, MD;  Location: [facility];  Service: Gynecology;  Laterality: N/A;  . LUMBAR LAMINECTOMY/DECOMPRESSION MICRODISCECTOMY Left 04/10/2015   Procedure: Left L5-S1 Microdiscectomy;  Surgeon: Marybelle Killings, MD;  Location: [facility];  Service: Orthopedics;  Laterality: Left;  Marland Kitchen VAGINAL HYSTERECTOMY N/A 06/04/2020   Procedure: HYSTERECTOMY VAGINAL, MORCELLATION;  Surgeon: Chancy Milroy, MD;  Location: [facility];   Service: Gynecology;  Laterality: N/A;  . WISDOM TOOTH EXTRACTION     Social History   Occupational History  . Not on file  Tobacco Use  . Smoking status: Current Every Day Smoker    Packs/day: 0.25    Years: 16.00    Pack years: 4.00    Types: Cigarettes  . Smokeless tobacco:  Never Used  Vaping Use  . Vaping Use: Never used  Substance and Sexual Activity  . Alcohol use: Yes    Comment: occ  . Drug use: Yes    Types: Marijuana    Comment: occ  . Sexual activity: Not on file

## 2021-01-01 NOTE — Procedures (Signed)
Lumbosacral Transforaminal Epidural Steroid Injection - Sub-Pedicular Approach with Fluoroscopic Guidance  Patient: Meredith York      Date of Birth: 11-Jan-1976 MRN: [id number] PCP: Minette Brine, FNP      Visit Date: 11/28/2020   Universal Protocol:    Date/Time: 11/28/2020  Consent Given By: the patient  Position: PRONE  Additional Comments: Vital signs were monitored before and after the procedure. Patient was prepped and draped in the usual sterile fashion. The correct patient, procedure, and site was verified.   Injection Procedure Details:   Procedure diagnoses: Lumbar radiculopathy [M54.16]    Meds Administered:  Meds ordered this encounter  Medications  . dexamethasone (DECADRON) injection 15 mg    Laterality: Left  Location/Site:  L4-L5  Needle:5.0 in., 22 ga.  Short bevel or Quincke spinal needle  Needle Placement: Transforaminal  Findings:    -Comments: Excellent flow of contrast along the nerve, nerve root and into the epidural space.  Procedure Details: After squaring off the end-plates to get a true AP view, the C-arm was positioned so that an oblique view of the foramen as noted above was visualized. The target area is just inferior to the "nose of the scotty dog" or sub pedicular. The soft tissues overlying this structure were infiltrated with 2-3 ml. of 1% Lidocaine without Epinephrine.  The spinal needle was inserted toward the target using a "trajectory" view along the fluoroscope beam.  Under AP and lateral visualization, the needle was advanced so it did not puncture dura and was located close the 6 O'Clock position of the pedical in AP tracterory. Biplanar projections were used to confirm position. Aspiration was confirmed to be negative for CSF and/or blood. A 1-2 ml. volume of Isovue-250 was injected and flow of contrast was noted at each level. Radiographs were obtained for documentation purposes.   After attaining the desired flow of contrast  documented above, a 0.5 to 1.0 ml test dose of 0.25% Marcaine was injected into each respective transforaminal space.  The patient was observed for 90 seconds post injection.  After no sensory deficits were reported, and normal lower extremity motor function was noted,   the above injectate was administered so that equal amounts of the injectate were placed at each foramen (level) into the transforaminal epidural space.   Additional Comments:  The patient tolerated the procedure well Dressing: 2 x 2 sterile gauze and Band-Aid    Post-procedure details: Patient was observed during the procedure. Post-procedure instructions were reviewed.  Patient left the clinic in stable condition.

## 2021-01-03 ENCOUNTER — Telehealth: Payer: Self-pay

## 2021-01-03 NOTE — Telephone Encounter (Signed)
-----   Message from Minette Brine, [place] sent at 01/01/2021  6:36 PM EST ----- Call to schedule follow up her blood pressure was elevated at orthopedic.  ----- Message ----- From: Magnus Sinning, MD Sent: 01/01/2021  11:54 AM EST To: Minette Brine, FNP

## 2021-01-03 NOTE — Telephone Encounter (Signed)
Attempted to call pt unable to leave vm YL,RMA 

## 2021-01-07 ENCOUNTER — Other Ambulatory Visit: Payer: Self-pay | Admitting: Nurse Practitioner

## 2021-01-07 DIAGNOSIS — E559 Vitamin D deficiency, unspecified: Secondary | ICD-10-CM

## 2021-01-07 MED ORDER — VITAMIN D (ERGOCALCIFEROL) 1.25 MG (50000 UNIT) PO CAPS: 50000.0000 [IU] | ORAL_CAPSULE | ORAL | 1 refills | Status: DC

## 2021-01-09 ENCOUNTER — Ambulatory Visit: Payer: Self-pay | Admitting: Nurse Practitioner

## 2021-01-16 ENCOUNTER — Emergency Department (HOSPITAL_BASED_OUTPATIENT_CLINIC_OR_DEPARTMENT_OTHER): Payer: 59

## 2021-01-16 ENCOUNTER — Emergency Department (HOSPITAL_BASED_OUTPATIENT_CLINIC_OR_DEPARTMENT_OTHER)
Admission: EM | Admit: 2021-01-16 | Discharge: 2021-01-16 | Disposition: A | Discharge status: Home / Self Care | Payer: 59 | Attending: Emergency Medicine | Admitting: Emergency Medicine

## 2021-01-16 ENCOUNTER — Other Ambulatory Visit: Payer: Self-pay

## 2021-01-16 ENCOUNTER — Encounter (HOSPITAL_BASED_OUTPATIENT_CLINIC_OR_DEPARTMENT_OTHER): Payer: Self-pay | Admitting: Emergency Medicine

## 2021-01-16 DIAGNOSIS — Z79899 Other long term (current) drug therapy: Secondary | ICD-10-CM | POA: Diagnosis not present

## 2021-01-16 DIAGNOSIS — F17210 Nicotine dependence, cigarettes, uncomplicated: Secondary | ICD-10-CM | POA: Diagnosis not present

## 2021-01-16 DIAGNOSIS — E119 Type 2 diabetes mellitus without complications: Secondary | ICD-10-CM | POA: Diagnosis not present

## 2021-01-16 DIAGNOSIS — M79672 Pain in left foot: Secondary | ICD-10-CM | POA: Diagnosis not present

## 2021-01-16 DIAGNOSIS — R52 Pain, unspecified: Secondary | ICD-10-CM

## 2021-01-16 DIAGNOSIS — I10 Essential (primary) hypertension: Secondary | ICD-10-CM | POA: Insufficient documentation

## 2021-01-16 NOTE — ED Provider Notes (Signed)
[facility] EMERGENCY DEPARTMENT Provider Note   CSN: [id number] Arrival date & time: 01/16/21  3016     History Chief Complaint  Patient presents with  . Foot Pain    Meredith York is a 45 y.o. female.  HPI   Patient with no significant medical history presents with chief complaint of left foot pain.  Patient states she was walking down her stairs and her left leg gave out causing her to fall onto her butt.  She denies hitting her head, losing conscious, is not on anticoagulant.  She endorses that her foot has been in pain since then, she endorses swelling, unable to bear weight on it, difficulty bending her ankle but is able to move her toes and knee without difficulty. She denies worsening paresthesias or weakness in that foot, patient has a history of a bulging disc and typically has paresthesias in that foot.   She has been taking over-the-counter pain medication without any relief.   Patient denies any other alleviating factors.  Patient has headaches, fevers, chills, shortness breath, chest pain, abdominal pain, nausea, vomiting, diarrhea.   Past Medical History:  Diagnosis Date  . Carpal tunnel syndrome on right   . Chlamydia   . Gestational diabetes mellitus   . Hypertension    no medicaqtions at this time  . Migraines   . Ovarian cyst   . S/P lumbar discectomy 04/10/2015  . Tendonitis    right wrist  . Trichomonas   . Type 2 diabetes mellitus ([facility])    not currently taking medication    Patient Active Problem List   Diagnosis Date Noted  . Recurrent herniation of lumbar disc 10/30/2020  . Prediabetes 09/24/2020  . Post-operative state 06/04/2020  . Visit for routine gyn exam 11/29/2019  . Encounter for screening mammogram for breast cancer 11/29/2019  . History of bilateral tubal ligation 11/29/2019  . S/P cesarean section 07/14/2016  . Hypertension 07/05/2011    Past Surgical History:  Procedure Laterality Date  . ABDOMINAL HYSTERECTOMY    . BACK  SURGERY    . CESAREAN SECTION  12/27/2011   Procedure: CESAREAN SECTION;  Surgeon: Shelly Bombard, MD;  Location: [facility];  Service: Gynecology;  Laterality: N/A;  Primary cesarean section with delivery of baby girl at 32. Apgars 3/3/6  . CESAREAN SECTION N/A 08/03/2016   Procedure: CESAREAN SECTION;  Surgeon: Osborne Oman, MD;  Location: [facility];  Service: Obstetrics;  Laterality: N/A;  . CHOLECYSTECTOMY  02/2009  . DILATION AND CURETTAGE OF UTERUS N/A 06/25/2014   Procedure: DILATATION AND CURETTAGE;  Surgeon: Donnamae Jude, MD;  Location: [facility];  Service: Gynecology;  Laterality: N/A;  . LUMBAR LAMINECTOMY/DECOMPRESSION MICRODISCECTOMY Left 04/10/2015   Procedure: Left L5-S1 Microdiscectomy;  Surgeon: Marybelle Killings, MD;  Location: [facility];  Service: Orthopedics;  Laterality: Left;  Marland Kitchen VAGINAL HYSTERECTOMY N/A 06/04/2020   Procedure: HYSTERECTOMY VAGINAL, MORCELLATION;  Surgeon: Chancy Milroy, MD;  Location: [facility];  Service: Gynecology;  Laterality: N/A;  . WISDOM TOOTH EXTRACTION       OB History    Gravida  6   Para  4   Term  3   Preterm  1   AB  2   Living  3     SAB  1   IAB  1   Ectopic      Multiple  0   Live Births  4  Family History  Problem Relation Age of Onset  . Hypertension Maternal Aunt   . Hypertension Paternal Aunt   . Club foot Daughter   . Hirschsprung's disease Daughter   . Learning disabilities Daughter   . Polydactyly Daughter   . Hypertension Mother   . Diabetes Mother   . Heart disease Father   . Anesthesia problems Neg Hx   . Hypotension Neg Hx   . Malignant hyperthermia Neg Hx   . Pseudochol deficiency Neg Hx     Social History   Tobacco Use  . Smoking status: Current Every Day Smoker    Packs/day: 0.25    Years: 16.00    Pack years: 4.00    Types: Cigarettes  . Smokeless tobacco: Never Used  Vaping Use  . Vaping Use: Never used  Substance Use Topics  . Alcohol use: Yes     Comment: occ  . Drug use: Yes    Types: Marijuana    Comment: occ    Home Medications Prior to Admission medications   Medication Sig Start Date End Date Taking? Authorizing Provider  cyclobenzaprine (FLEXERIL) 10 MG tablet Take 1 tablet (10 mg total) by mouth 2 (two) times daily as needed for muscle spasms. 12/29/20   Carlisle Cater, PA-C  gabapentin (NEURONTIN) 100 MG capsule Take one at night time 1 week then 2 po q HS 01/01/21   Marybelle Killings, MD  hydrochlorothiazide (HYDRODIURIL) 25 MG tablet Take 1 tablet (25 mg total) by mouth daily. 09/24/20   Minette Brine, FNP  methocarbamol (ROBAXIN) 500 MG tablet TAKE 1 TABLET(500 MG) BY MOUTH EVERY 8 HOURS AS NEEDED FOR MUSCLE SPASMS 12/30/20   Marybelle Killings, MD  predniSONE (DELTASONE) 20 MG tablet 3 Tabs PO Days 1-3, then 2 tabs PO Days 4-6, then 1 tab PO Day 7-9, then Half Tab PO Day 10-12 Patient not taking: Reported on 01/01/2021 12/29/20   Carlisle Cater, PA-C  Semaglutide (RYBELSUS) 7 MG TABS Take 1 tablet by mouth daily. Take 30 minutes before breakfast 12/25/20   Minette Brine, FNP  traMADol (ULTRAM) 50 MG tablet TAKE 1 TABLET(50 MG) BY MOUTH EVERY 6 HOURS AS NEEDED 12/30/20   Marybelle Killings, MD  Vitamin D, Ergocalciferol, (DRISDOL) 1.25 MG (50000 UNIT) CAPS capsule Take 1 capsule (50,000 Units total) by mouth 2 (two) times a week. 01/09/21   Minette Brine, FNP    Allergies    Amoxicillin and Percocet [oxycodone-acetaminophen]  Review of Systems   Review of Systems  Constitutional: Negative for chills and fever.  HENT: Negative for congestion.   Respiratory: Negative for shortness of breath.   Cardiovascular: Negative for chest pain.  Gastrointestinal: Negative for abdominal pain.  Genitourinary: Negative for enuresis.  Musculoskeletal: Negative for back pain.       Left foot pain.  Skin: Negative for rash.  Neurological: Negative for dizziness and headaches.  Hematological: Does not bruise/bleed easily.    Physical Exam Updated Vital  Signs BP (!) 145/108 (BP Location: Left Arm)   Pulse 90   Temp 98.4 F (36.9 C) (Oral)   Resp 18   Ht 5' 2.5" (1.588 m)   Wt 87.1 kg   LMP  (LMP Unknown)   SpO2 100%   BMI 34.56 kg/m   Physical Exam Vitals and nursing note reviewed.  Constitutional:      General: She is not in acute distress.    Appearance: Normal appearance. She is not ill-appearing or diaphoretic.  HENT:  Head: Normocephalic and atraumatic.  Eyes:     Conjunctiva/sclera: Conjunctivae normal.  Cardiovascular:     Rate and Rhythm: Normal rate.  Pulmonary:     Effort: Pulmonary effort is normal.  Musculoskeletal:        General: Swelling and tenderness present.     Cervical back: Neck supple.     Right lower leg: No edema.     Left lower leg: No edema.     Comments: Patient's left foot was visualized, it was edematous, no erythema, lacerations, abrasions or other gross abnormalities noted.  She had full range of motion at her toes, ankle, knee.  Neurovascular fully intact.  She was tender to palpation along metatarsals 1 through 5, there is no deformities or crepitus present.  She was nontender on her calcaneus.  Negative Thompson test.  Skin:    General: Skin is warm and dry.     Coloration: Skin is not jaundiced or pale.  Neurological:     Mental Status: She is alert and oriented to person, place, and time.  Psychiatric:        Mood and Affect: Mood normal.     ED Results / Procedures / Treatments   Labs (all labs ordered are listed, but only abnormal results are displayed) Labs Reviewed - No data to display  EKG None  Radiology DG Foot Complete Left  Result Date: 01/16/2021 CLINICAL DATA:  Recent fall with left foot pain laterally, initial encounter EXAM: LEFT FOOT - COMPLETE 3+ VIEW COMPARISON:  None. FINDINGS: There is no evidence of fracture or dislocation. There is no evidence of arthropathy or other focal bone abnormality. Soft tissues are unremarkable. IMPRESSION: No acute abnormality  noted. Electronically Signed   By: Inez Catalina M.D.   On: 01/16/2021 09:26    Procedures Procedures   Medications Ordered in ED Medications - No data to display  ED Course  I have reviewed the triage vital signs and the nursing notes.  Pertinent labs & imaging results that were available during my care of the patient were reviewed by me and considered in my medical decision making (see chart for details).    MDM Rules/Calculators/A&P                          Patient presents with left foot pain.  She is alert, does not appear in acute distress, vital signs reassuring.  Will obtain plain films for further evaluation.  X-ray of left foot does not reveal any acute findings.   Low suspicion for fracture or dislocation as x-ray does not feel any significant findings. low suspicion for ligament or tendon damage as area was palpated no gross defects noted, she had full range of motion.  Low suspicion for ruptured Achilles tendon as patient had a negative Thompson test, she is able to flex and extend ankle without difficulty.  Low suspicion for compartment syndrome as area was palpated it was soft to the touch, neurovascular fully intact.  I suspect patient suffering from a muscular or ligament strain, will place patient in a postop boot, make her nonweightbearing provide her with crutches have her follow-up with her sports medicine for further evaluation.  Vital signs have remained stable, no indication for hospital admission.   Patient given at home care as well strict return precautions.  Patient verbalized that they understood agreed to said plan.   Final Clinical Impression(s) / ED Diagnoses Final diagnoses:  Pain  Foot pain, left  Rx / DC Orders ED Discharge Orders    None       Aron Baba 01/16/21 1119    Gareth Morgan, MD 01/19/21 1057

## 2021-01-16 NOTE — ED Triage Notes (Signed)
Reports she fell on Tuesday going down the steps.  Now having pain in left foot.  Swelling noted.

## 2021-01-16 NOTE — Discharge Instructions (Signed)
You have been seen here for left foot pain. I recommend taking over-the-counter pain medications like ibuprofen and/or Tylenol every 6 as needed.  Please follow dosage and on the back of bottle.  I have also placed you in a postop boot, I would like you to wear this for least 1 week's time, you may take this off at nighttime.  You were also given crutches please use to help keep that foot nonweightbearing.  I also recommend applying ice to the area and keep it elevated as will help decrease inflammation and swelling.  Please follow-up with sports medicine for further evaluation.  His contact information is above.  Come back to the emergency department if you develop chest pain, shortness of breath, severe abdominal pain, uncontrolled nausea, vomiting, diarrhea.

## 2021-01-17 ENCOUNTER — Ambulatory Visit (INDEPENDENT_AMBULATORY_CARE_PROVIDER_SITE_OTHER): Payer: 59 | Admitting: Family Medicine

## 2021-01-17 ENCOUNTER — Other Ambulatory Visit: Payer: Self-pay

## 2021-01-17 ENCOUNTER — Ambulatory Visit: Payer: Self-pay

## 2021-01-17 VITALS — BP 120/70 | Ht 62.5 in | Wt 192.0 lb

## 2021-01-17 DIAGNOSIS — S93402A Sprain of unspecified ligament of left ankle, initial encounter: Secondary | ICD-10-CM

## 2021-01-17 DIAGNOSIS — M25572 Pain in left ankle and joints of left foot: Secondary | ICD-10-CM

## 2021-01-17 DIAGNOSIS — S86312D Strain of muscle(s) and tendon(s) of peroneal muscle group at lower leg level, left leg, subsequent encounter: Secondary | ICD-10-CM | POA: Insufficient documentation

## 2021-01-17 NOTE — Patient Instructions (Signed)
Nice to meet you Please try the boot  Please try the range of motion exercises  Please use ibuprofen or tylenol as needed  Please send me a message in MyChart with any questions or updates.  Please see me back in 2 weeks.   --Dr. Raeford Razor

## 2021-01-17 NOTE — Assessment & Plan Note (Signed)
Acute in nature.  Seems to have change of the retinaculum.  No ankle effusion. -Counseled on home exercise therapy and supportive care. -Cam walker. -Work note. -Follow-up in 2 weeks.  Can consider physical therapy or further imaging.

## 2021-01-17 NOTE — Progress Notes (Signed)
Meredith York - 45 y.o. female MRN [id number]  Date of birth: [birth year]  SUBJECTIVE:  Including CC & ROS.  No chief complaint on file.   Meredith York is a 45 y.o. female that is presenting with left foot and ankle pain. She had an inversion injury. She is unsure of how her foot was turned. No prior history of similar pain. Pain worse with ambulation. Has been taking tramadol for pain.    Review of Systems See HPI   HISTORY: Past Medical, Surgical, Social, and Family History Reviewed & Updated per EMR.   Pertinent Historical Findings include:  Past Medical History:  Diagnosis Date  . Carpal tunnel syndrome on right   . Chlamydia   . Gestational diabetes mellitus   . Hypertension    no medicaqtions at this time  . Migraines   . Ovarian cyst   . S/P lumbar discectomy 04/10/2015  . Tendonitis    right wrist  . Trichomonas   . Type 2 diabetes mellitus ([place])    not currently taking medication    Past Surgical History:  Procedure Laterality Date  . ABDOMINAL HYSTERECTOMY    . BACK SURGERY    . CESAREAN SECTION  12/27/2011   Procedure: CESAREAN SECTION;  Surgeon: Shelly Bombard, MD;  Location: [facility];  Service: Gynecology;  Laterality: N/A;  Primary cesarean section with delivery of baby girl at 9. Apgars 3/3/6  . CESAREAN SECTION N/A 08/03/2016   Procedure: CESAREAN SECTION;  Surgeon: Osborne Oman, MD;  Location: [facility];  Service: Obstetrics;  Laterality: N/A;  . CHOLECYSTECTOMY  02/2009  . DILATION AND CURETTAGE OF UTERUS N/A 06/25/2014   Procedure: DILATATION AND CURETTAGE;  Surgeon: Donnamae Jude, MD;  Location: [facility];  Service: Gynecology;  Laterality: N/A;  . LUMBAR LAMINECTOMY/DECOMPRESSION MICRODISCECTOMY Left 04/10/2015   Procedure: Left L5-S1 Microdiscectomy;  Surgeon: Marybelle Killings, MD;  Location: [facility];  Service: Orthopedics;  Laterality: Left;  Marland Kitchen VAGINAL HYSTERECTOMY N/A 06/04/2020   Procedure: HYSTERECTOMY VAGINAL, MORCELLATION;  Surgeon: Chancy Milroy, MD;  Location: [facility];  Service: Gynecology;  Laterality: N/A;  . WISDOM TOOTH EXTRACTION      Family History  Problem Relation Age of Onset  . Hypertension Maternal Aunt   . Hypertension Paternal Aunt   . Club foot Daughter   . Hirschsprung's disease Daughter   . Learning disabilities Daughter   . Polydactyly Daughter   . Hypertension Mother   . Diabetes Mother   . Heart disease Father   . Anesthesia problems Neg Hx   . Hypotension Neg Hx   . Malignant hyperthermia Neg Hx   . Pseudochol deficiency Neg Hx     Social History   Socioeconomic History  . Marital status: Single    Spouse name: Not on file  . Number of children: Not on file  . Years of education: Not on file  . Highest education level: Not on file  Occupational History  . Not on file  Tobacco Use  . Smoking status: Current Every Day Smoker    Packs/day: 0.25    Years: 16.00    Pack years: 4.00    Types: Cigarettes  . Smokeless tobacco: Never Used  Vaping Use  . Vaping Use: Never used  Substance and Sexual Activity  . Alcohol use: Yes    Comment: occ  . Drug use: Yes    Types: Marijuana    Comment: occ  . Sexual activity: Not on file  Other Topics Concern  . Not on file  Social History Narrative  . Not on file   Social Determinants of Health   Financial Resource Strain: Not on file  Food Insecurity: Not on file  Transportation Needs: Not on file  Physical Activity: Not on file  Stress: Not on file  Social Connections: Not on file  Intimate Partner Violence: Not on file     PHYSICAL EXAM:  VS: BP 120/70   Ht 5' 2.5" (1.588 m)   Wt 192 lb (87.1 kg)   LMP  (LMP Unknown)   BMI 34.56 kg/m  Physical Exam Gen: NAD, alert, cooperative with exam, well-appearing MSK:  Left foot/ankle:  No swelling  Limited range of motion.  Tenderness over the dorsal midfoot.  Neurovascularly intact.   Limited ultrasound: left foot/ankle:  No ankle effusion  No changes  of the distal fib  Effusion encircling of the peroneal tendons at the lateral malleolus No changes at the base of the 5th.   Summary: Findings would suggest a retinacular disruption around the peroneal tendons.  Ultrasound and interpretation by Clearance Coots, MD       ASSESSMENT & PLAN:   Sprain of left ankle Acute in nature.  Seems to have change of the retinaculum.  No ankle effusion. -Counseled on home exercise therapy and supportive care. -Cam walker. -Work note. -Follow-up in 2 weeks.  Can consider physical therapy or further imaging.

## 2021-01-20 ENCOUNTER — Other Ambulatory Visit: Payer: Self-pay

## 2021-01-20 ENCOUNTER — Ambulatory Visit
Admission: RE | Admit: 2021-01-20 | Discharge: 2021-01-20 | Disposition: A | Discharge status: Home / Self Care | Payer: 59 | Source: Ambulatory Visit | Attending: Obstetrics and Gynecology | Admitting: Obstetrics and Gynecology

## 2021-01-20 DIAGNOSIS — Z1231 Encounter for screening mammogram for malignant neoplasm of breast: Secondary | ICD-10-CM

## 2021-01-23 ENCOUNTER — Other Ambulatory Visit: Payer: Self-pay

## 2021-01-23 ENCOUNTER — Ambulatory Visit: Payer: Self-pay

## 2021-01-23 ENCOUNTER — Ambulatory Visit (INDEPENDENT_AMBULATORY_CARE_PROVIDER_SITE_OTHER): Payer: 59 | Admitting: Physical Medicine and Rehabilitation

## 2021-01-23 ENCOUNTER — Encounter: Payer: Self-pay | Admitting: Physical Medicine and Rehabilitation

## 2021-01-23 VITALS — BP 147/96 | HR 94

## 2021-01-23 DIAGNOSIS — M5416 Radiculopathy, lumbar region: Secondary | ICD-10-CM

## 2021-01-23 DIAGNOSIS — M961 Postlaminectomy syndrome, not elsewhere classified: Secondary | ICD-10-CM | POA: Diagnosis not present

## 2021-01-23 DIAGNOSIS — M5116 Intervertebral disc disorders with radiculopathy, lumbar region: Secondary | ICD-10-CM | POA: Diagnosis not present

## 2021-01-23 MED ORDER — METHYLPREDNISOLONE ACETATE 80 MG/ML IJ SUSP
80.0000 mg | Freq: Once | INTRAMUSCULAR | Status: AC
Start: 2021-01-23 — End: 2021-01-23
  Administered 2021-01-23: 80 mg

## 2021-01-23 NOTE — Patient Instructions (Signed)

## 2021-01-23 NOTE — Procedures (Signed)
Lumbosacral Transforaminal Epidural Steroid Injection - Sub-Pedicular Approach with Fluoroscopic Guidance  Patient: Meredith York      Date of Birth: Nov 18, 1976 MRN: [id number] PCP: Minette Brine, FNP      Visit Date: 01/23/2021   Universal Protocol:    Date/Time: 01/23/2021  Consent Given By: the patient  Position: PRONE  Additional Comments: Vital signs were monitored before and after the procedure. Patient was prepped and draped in the usual sterile fashion. The correct patient, procedure, and site was verified.   Injection Procedure Details:   Procedure diagnoses: Lumbar radiculopathy [M54.16]    Meds Administered:  Meds ordered this encounter  Medications  . methylPREDNISolone acetate (DEPO-MEDROL) injection 80 mg    Laterality: Left  Location/Site:  L5-S1  Needle:5.0 in., 22 ga.  Short bevel or Quincke spinal needle  Needle Placement: Transforaminal  Findings:    -Comments: Excellent flow of contrast along the nerve, nerve root and into the epidural space.  Procedure Details: After squaring off the end-plates to get a true AP view, the C-arm was positioned so that an oblique view of the foramen as noted above was visualized. The target area is just inferior to the "nose of the scotty dog" or sub pedicular. The soft tissues overlying this structure were infiltrated with 2-3 ml. of 1% Lidocaine without Epinephrine.  The spinal needle was inserted toward the target using a "trajectory" view along the fluoroscope beam.  Under AP and lateral visualization, the needle was advanced so it did not puncture dura and was located close the 6 O'Clock position of the pedical in AP tracterory. Biplanar projections were used to confirm position. Aspiration was confirmed to be negative for CSF and/or blood. A 1-2 ml. volume of Isovue-250 was injected and flow of contrast was noted at each level. Radiographs were obtained for documentation purposes.   After attaining the desired  flow of contrast documented above, a 0.5 to 1.0 ml test dose of 0.25% Marcaine was injected into each respective transforaminal space.  The patient was observed for 90 seconds post injection.  After no sensory deficits were reported, and normal lower extremity motor function was noted,   the above injectate was administered so that equal amounts of the injectate were placed at each foramen (level) into the transforaminal epidural space.   Additional Comments:  The patient tolerated the procedure well Dressing: 2 x 2 sterile gauze and Band-Aid    Post-procedure details: Patient was observed during the procedure. Post-procedure instructions were reviewed.  Patient left the clinic in stable condition.

## 2021-01-23 NOTE — Progress Notes (Signed)
Pt state left leg pain that travels to her foot. Pt state walking and standing makes the pain worse. Pt state she take pain meds and ice to help ease the pain Pt has of inj on Left L4 transforaminal esi, 11/28/20 pt state it worked for three weeks.  Numeric Pain Rating Scale and Functional Assessment Average Pain 8   In the last MONTH (on 0-10 scale) has pain interfered with the following?  1. General activity like being  able to carry out your everyday physical activities such as walking, climbing stairs, carrying groceries, or moving a chair?  Rating(9)   +Driver, -BT, -Dye Allergies.

## 2021-01-23 NOTE — Progress Notes (Addendum)
Meredith York - 45 y.o. female MRN [id number]  Date of birth: 1976-01-20  Office Visit Note: Visit Date: 01/23/2021 PCP: Arnette FeltsMoore, Janece, FNP Referred by: Arnette FeltsMoore, Janece, FNP  Subjective: Chief Complaint  Patient presents with  . Left Leg - Pain  . Left Foot - Pain   HPI:  Meredith Coonsamaky Nardozzi is a 45 y.o. female who comes in today at the request of Dr. Annell GreeningMark Yates for planned Left L5-S1 Lumbar epidural steroid injection with fluoroscopic guidance.  The patient has failed conservative care including home exercise, medications, time and activity modification.  This injection will be diagnostic and hopefully therapeutic.  Please see requesting physician notes for further details and justification. More symptoms in an L5 distribution on left.  MRI reviewed with images and spine model.  MRI reviewed in the note below.    ROS Otherwise per HPI.  Assessment & Plan: Visit Diagnoses:    ICD-10-CM   1. Lumbar radiculopathy  M54.16 XR C-ARM NO REPORT    Epidural Steroid injection    methylPREDNISolone acetate (DEPO-MEDROL) injection 80 mg  2. Radiculopathy due to lumbar intervertebral disc disorder  M51.16 XR C-ARM NO REPORT    Epidural Steroid injection    methylPREDNISolone acetate (DEPO-MEDROL) injection 80 mg  3. Post laminectomy syndrome  M96.1 XR C-ARM NO REPORT    Epidural Steroid injection    methylPREDNISolone acetate (DEPO-MEDROL) injection 80 mg    Plan: No additional findings.   Meds & Orders:  Meds ordered this encounter  Medications  . methylPREDNISolone acetate (DEPO-MEDROL) injection 80 mg    Orders Placed This Encounter  Procedures  . XR C-ARM NO REPORT  . Epidural Steroid injection    Follow-up: Return in about 2 weeks (around 02/06/2021) for Annell GreeningMark Yates, MD.   Procedures: No procedures performed  Lumbosacral Transforaminal Epidural Steroid Injection - Sub-Pedicular Approach with Fluoroscopic Guidance  Patient: Meredith Coonsamaky Deeney      Date of Birth: 1976-01-20 MRN:  [id number] PCP: Arnette FeltsMoore, Janece, FNP      Visit Date: 01/23/2021   Universal Protocol:    Date/Time: 01/23/2021  Consent Given By: the patient  Position: PRONE  Additional Comments: Vital signs were monitored before and after the procedure. Patient was prepped and draped in the usual sterile fashion. The correct patient, procedure, and site was verified.   Injection Procedure Details:   Procedure diagnoses: Lumbar radiculopathy [M54.16]    Meds Administered:  Meds ordered this encounter  Medications  . methylPREDNISolone acetate (DEPO-MEDROL) injection 80 mg    Laterality: Left  Location/Site:  L5-S1  Needle:5.0 in., 22 ga.  Short bevel or Quincke spinal needle  Needle Placement: Transforaminal  Findings:    -Comments: Excellent flow of contrast along the nerve, nerve root and into the epidural space.  Procedure Details: After squaring off the end-plates to get a true AP view, the C-arm was positioned so that an oblique view of the foramen as noted above was visualized. The target area is just inferior to the "nose of the scotty dog" or sub pedicular. The soft tissues overlying this structure were infiltrated with 2-3 ml. of 1% Lidocaine without Epinephrine.  The spinal needle was inserted toward the target using a "trajectory" view along the fluoroscope beam.  Under AP and lateral visualization, the needle was advanced so it did not puncture dura and was located close the 6 O'Clock position of the pedical in AP tracterory. Biplanar projections were used to confirm position. Aspiration was confirmed to be negative for CSF and/or blood.  A 1-2 ml. volume of Isovue-250 was injected and flow of contrast was noted at each level. Radiographs were obtained for documentation purposes.   After attaining the desired flow of contrast documented above, a 0.5 to 1.0 ml test dose of 0.25% Marcaine was injected into each respective transforaminal space.  The patient was observed for 90  seconds post injection.  After no sensory deficits were reported, and normal lower extremity motor function was noted,   the above injectate was administered so that equal amounts of the injectate were placed at each foramen (level) into the transforaminal epidural space.   Additional Comments:  The patient tolerated the procedure well Dressing: 2 x 2 sterile gauze and Band-Aid    Post-procedure details: Patient was observed during the procedure. Post-procedure instructions were reviewed.  Patient left the clinic in stable condition.      Clinical History: MRI LUMBAR SPINE WITHOUT AND WITH CONTRAST  TECHNIQUE: Multiplanar and multiecho pulse sequences of the lumbar spine were obtained without and with intravenous contrast.  CONTRAST:  57mL MULTIHANCE GADOBENATE DIMEGLUMINE 529 MG/ML IV SOLN  COMPARISON:  Radiographs of the lumbar spine 09/11/2020. lumbar spine MRI 01/29/2015.  FINDINGS: Segmentation: For the purposes of this dictation, five lumbar vertebrae are assumed and the caudal most well-formed intervertebral disc is designated L5-S1.  Alignment: Straightening of the expected lumbar lordosis. No significant spondylolisthesis.  Vertebrae: Vertebral body height is maintained. No focal suspicious osseous lesion or significant marrow edema.  Conus medullaris and cauda equina: Conus extends to the L1-L2 level. No signal abnormality within the visualized distal spinal cord.  Paraspinal and other soft tissues: No abnormality identified within included portions of the abdomen/retroperitoneum. Postsurgical changes to the lower dorsal lumbar soft tissues.  Disc levels:  Unless otherwise stated, the level by level findings below have not significantly changed since prior MRI 01/29/2015  Mild L5-S1 disc degeneration. Disc height and hydration are maintained at the remaining lumbar levels.  There is no significant disc herniation, spinal canal stenosis  or neural foraminal narrowing at the T12-L1 through L2-L3 levels.  L3-L4: Mild facet arthrosis on the left. Minimal relative left subarticular narrowing without nerve root impingement. Central canal patent. No significant foraminal stenosis.  L4-L5: New from the prior MRI, there is a left foraminal/extraforaminal disc extrusion (series 5, image 28) (series 8, image 28) (series 2, image 12). Mild facet arthrosis/ligamentum flavum hypertrophy (greater on the left). Minimal relative left subarticular narrowing without nerve root impingement. Central canal patent. Progressive moderate left foraminal stenosis with the disc extrusion contacting the exiting left L4 nerve root within and beyond the left neural foramen (series 2, image 12) (series 5, image 28). No significant foraminal stenosis on the right.  L5-S1: Since the prior MRI, there is been interval left hemilaminotomy and discectomy. Disc bulge with endplate spurring. Residual/recurrent shallow broad-based central disc protrusion. Facet arthrosis (greater on the left). The residual/recurrent disc protrusion contributes to moderate left and mild right subarticular narrowing with contact upon the left greater than right descending S1 nerve roots (series 4, image 34). Central canal patent. Mild/moderate bilateral neural foraminal narrowing, progressed.  IMPRESSION: Comparison is made to the prior MRI of 08/30/2015. Lumbar spondylosis and postsurgical changes as outlined and with findings most notably as follows.  At L4-L5, there is a new left foraminal/extraforaminal disc extrusion. Mild facet arthrosis/ligamentum flavum hypertrophy. Progressive moderate left foraminal stenosis with the disc extrusion contacting the exiting left L4 nerve root within and beyond the left neural foramen. Minimal relative left subarticular  narrowing without nerve root impingement.  At L5-S1, there has been interval left hemilaminotomy  and discectomy. Mild disc degeneration. Disc bulge with endplate spurring. Residual/recurrent shallow broad-based central disc protrusion. Facet arthrosis (greater on the left). The residual/recurrent disc protrusion contributes to moderate left and mild right subarticular narrowing with contact upon the left greater than right descending S1 nerve roots. Mild/moderate bilateral neural foraminal narrowing, progressed.   Electronically Signed   By: Kellie Simmering DO   On: 10/22/2020 14:45     Objective:  VS:  HT:    WT:   BMI:     BP:(!) 147/96  HR:94bpm  TEMP: ( )  RESP:  Physical Exam Vitals and nursing note reviewed.  Constitutional:      General: She is not in acute distress.    Appearance: Normal appearance. She is not ill-appearing.  HENT:     Head: Normocephalic and atraumatic.     Right Ear: External ear normal.     Left Ear: External ear normal.  Eyes:     Extraocular Movements: Extraocular movements intact.  Cardiovascular:     Rate and Rhythm: Normal rate.     Pulses: Normal pulses.  Pulmonary:     Effort: Pulmonary effort is normal. No respiratory distress.  Abdominal:     General: There is no distension.     Palpations: Abdomen is soft.  Musculoskeletal:        General: Tenderness present.     Cervical back: Neck supple.     Right lower leg: No edema.     Left lower leg: No edema.     Comments: Patient has good distal strength with no pain over the greater trochanters.  No clonus or focal weakness.  Skin:    Findings: No erythema, lesion or rash.  Neurological:     General: No focal deficit present.     Mental Status: She is alert and oriented to person, place, and time.     Sensory: No sensory deficit.     Motor: No weakness or abnormal muscle tone.     Coordination: Coordination normal.  Psychiatric:        Mood and Affect: Mood normal.        Behavior: Behavior normal.      Imaging: Epidural Steroid injection  Result Date:  01/23/2021 Magnus Sinning, MD     01/23/2021 10:10 AM Lumbosacral Transforaminal Epidural Steroid Injection - Sub-Pedicular Approach with Fluoroscopic Guidance Patient: Junelle Hashemi     Date of Birth: 1976-09-16 MRN: [id number] PCP: Minette Brine, FNP     Visit Date: 01/23/2021  Universal Protocol:   Date/Time: 01/23/2021 Consent Given By: the patient Position: PRONE Additional Comments: Vital signs were monitored before and after the procedure. Patient was prepped and draped in the usual sterile fashion. The correct patient, procedure, and site was verified. Injection Procedure Details: Procedure diagnoses: Lumbar radiculopathy [M54.16]  Meds Administered: Meds ordered this encounter Medications . methylPREDNISolone acetate (DEPO-MEDROL) injection 80 mg Laterality: Left Location/Site: L5-S1 Needle:5.0 in., 22 ga.  Short bevel or Quincke spinal needle Needle Placement: Transforaminal Findings:   -Comments: Excellent flow of contrast along the nerve, nerve root and into the epidural space. Procedure Details: After squaring off the end-plates to get a true AP view, the C-arm was positioned so that an oblique view of the foramen as noted above was visualized. The target area is just inferior to the "nose of the scotty dog" or sub pedicular. The soft tissues overlying this structure were infiltrated  with 2-3 ml. of 1% Lidocaine without Epinephrine. The spinal needle was inserted toward the target using a "trajectory" view along the fluoroscope beam.  Under AP and lateral visualization, the needle was advanced so it did not puncture dura and was located close the 6 O'Clock position of the pedical in AP tracterory. Biplanar projections were used to confirm position. Aspiration was confirmed to be negative for CSF and/or blood. A 1-2 ml. volume of Isovue-250 was injected and flow of contrast was noted at each level. Radiographs were obtained for documentation purposes. After attaining the desired flow of contrast documented  above, a 0.5 to 1.0 ml test dose of 0.25% Marcaine was injected into each respective transforaminal space.  The patient was observed for 90 seconds post injection.  After no sensory deficits were reported, and normal lower extremity motor function was noted,   the above injectate was administered so that equal amounts of the injectate were placed at each foramen (level) into the transforaminal epidural space. Additional Comments: The patient tolerated the procedure well Dressing: 2 x 2 sterile gauze and Band-Aid  Post-procedure details: Patient was observed during the procedure. Post-procedure instructions were reviewed. Patient left the clinic in stable condition.

## 2021-01-31 ENCOUNTER — Ambulatory Visit: Payer: 59 | Admitting: Family Medicine

## 2021-02-04 ENCOUNTER — Other Ambulatory Visit: Payer: Self-pay

## 2021-02-04 ENCOUNTER — Ambulatory Visit: Payer: 59 | Admitting: Family Medicine

## 2021-02-04 DIAGNOSIS — S93402D Sprain of unspecified ligament of left ankle, subsequent encounter: Secondary | ICD-10-CM | POA: Diagnosis not present

## 2021-02-04 NOTE — Progress Notes (Signed)
Meredith York - 45 y.o. female MRN [id number]  Date of birth: 1976/02/03  SUBJECTIVE:  Including CC & ROS.  No chief complaint on file.   Meredith York is a 45 y.o. female that is following up for her left ankle sprain.  She has been doing well with the cam walker.  Having no significant pain.  She does lack flexion extension of the fourth and fifth toe.   Review of Systems See HPI   HISTORY: Past Medical, Surgical, Social, and Family History Reviewed & Updated per EMR.   Pertinent Historical Findings include:  Past Medical History:  Diagnosis Date  . Carpal tunnel syndrome on right   . Chlamydia   . Gestational diabetes mellitus   . Hypertension    no medicaqtions at this time  . Migraines   . Ovarian cyst   . S/P lumbar discectomy 04/10/2015  . Tendonitis    right wrist  . Trichomonas   . Type 2 diabetes mellitus (HCC)    not currently taking medication    Past Surgical History:  Procedure Laterality Date  . ABDOMINAL HYSTERECTOMY    . BACK SURGERY    . CESAREAN SECTION  12/27/2011   Procedure: CESAREAN SECTION;  Surgeon: Brock Bad, MD;  Location: [facility];  Service: Gynecology;  Laterality: N/A;  Primary cesarean section with delivery of baby girl at 30. Apgars 3/3/6  . CESAREAN SECTION N/A 08/03/2016   Procedure: CESAREAN SECTION;  Surgeon: Tereso Newcomer, MD;  Location: [facility];  Service: Obstetrics;  Laterality: N/A;  . CHOLECYSTECTOMY  02/2009  . DILATION AND CURETTAGE OF UTERUS N/A 06/25/2014   Procedure: DILATATION AND CURETTAGE;  Surgeon: Reva Bores, MD;  Location: [facility];  Service: Gynecology;  Laterality: N/A;  . LUMBAR LAMINECTOMY/DECOMPRESSION MICRODISCECTOMY Left 04/10/2015   Procedure: Left L5-S1 Microdiscectomy;  Surgeon: Eldred Manges, MD;  Location: [facility];  Service: Orthopedics;  Laterality: Left;  Marland Kitchen VAGINAL HYSTERECTOMY N/A 06/04/2020   Procedure: HYSTERECTOMY VAGINAL, MORCELLATION;  Surgeon: Hermina Staggers, MD;  Location: [facility];  Service: Gynecology;  Laterality: N/A;  . WISDOM TOOTH EXTRACTION      Family History  Problem Relation Age of Onset  . Hypertension Maternal Aunt   . Hypertension Paternal Aunt   . Club foot Daughter   . Hirschsprung's disease Daughter   . Learning disabilities Daughter   . Polydactyly Daughter   . Hypertension Mother   . Diabetes Mother   . Heart disease Father   . Anesthesia problems Neg Hx   . Hypotension Neg Hx   . Malignant hyperthermia Neg Hx   . Pseudochol deficiency Neg Hx     Social History   Socioeconomic History  . Marital status: Single    Spouse name: Not on file  . Number of children: Not on file  . Years of education: Not on file  . Highest education level: Not on file  Occupational History  . Not on file  Tobacco Use  . Smoking status: Current Every Day Smoker    Packs/day: 0.25    Years: 16.00    Pack years: 4.00    Types: Cigarettes  . Smokeless tobacco: Never Used  Vaping Use  . Vaping Use: Never used  Substance and Sexual Activity  . Alcohol use: Yes    Comment: occ  . Drug use: Yes    Types: Marijuana    Comment: occ  . Sexual activity: Not on file  Other Topics Concern  .  Not on file  Social History Narrative  . Not on file   Social Determinants of Health   Financial Resource Strain: Not on file  Food Insecurity: Not on file  Transportation Needs: Not on file  Physical Activity: Not on file  Stress: Not on file  Social Connections: Not on file  Intimate Partner Violence: Not on file     PHYSICAL EXAM:  VS: BP (!) 146/90 (BP Location: Left Arm, Patient Position: Sitting, Cuff Size: Large)   Ht 5' 2.5" (1.588 m)   Wt 192 lb (87.1 kg)   LMP  (LMP Unknown)   BMI 34.56 kg/m  Physical Exam Gen: NAD, alert, cooperative with exam, well-appearing MSK:  Left ankle: No swelling or ecchymosis. Normal range of motion. Normal strength resistance. Lack of flexion of the fourth and fifth toe Neurovascularly  intact     ASSESSMENT & PLAN:   Sprain of left ankle Has been doing well with the cam walker and has had improvement of her pain and function. -Counseled on home exercise therapy and supportive care. -Transition to lace up brace.  Could consider AFO -Has lack of some function of the fourth and fifth digit which could be resulting from her back.  Will follow up with me may need therapy.

## 2021-02-04 NOTE — Patient Instructions (Signed)
Good to see you Please try ice as needed  Please use the brace and wean out of the CAM walker  Please continue the exercises   Please send me a message in MyChart with any questions or updates.  Please see me back in 4 weeks.   --Dr. Raeford Razor

## 2021-02-04 NOTE — Assessment & Plan Note (Signed)
Has been doing well with the cam walker and has had improvement of her pain and function. -Counseled on home exercise therapy and supportive care. -Transition to lace up brace.  Could consider AFO -Has lack of some function of the fourth and fifth digit which could be resulting from her back.  Will follow up with me may need therapy.

## 2021-02-11 ENCOUNTER — Ambulatory Visit (INDEPENDENT_AMBULATORY_CARE_PROVIDER_SITE_OTHER): Payer: 59 | Admitting: Orthopaedic Surgery

## 2021-02-11 VITALS — BP 156/103 | Ht 62.5 in | Wt 197.0 lb

## 2021-02-11 DIAGNOSIS — M5126 Other intervertebral disc displacement, lumbar region: Secondary | ICD-10-CM

## 2021-02-11 NOTE — Progress Notes (Signed)
Office Visit Note   Patient: Meredith York           Date of Birth: Oct 26, 1976           MRN: [id number] Visit Date: 02/11/2021              Requested by: Minette Brine, [address] [place],  [place] [address] PCP: Minette Brine, FNP   Assessment & Plan: Visit Diagnoses:  1. Recurrent herniation of lumbar disc     Plan: Patient has some residual or recurrent disc protrusion on the left at L5-S1 not as severe at the time of surgery.  She has some moderate left and mild right subarticular narrowing.  She also has new left extraforaminal disc protrusion at L4-5 level which may be a portion of her symptoms.  She got better with the injection at L5-S1.  We will check her back again in 3 months.  She is continuing work activity.  If she gets significant increase she will call let us know.  She asked about continue taking tramadol and she could try taking couple Aleve at night when she seems to have more symptoms.  Recheck 3 months.  Follow-Up Instructions: Return in about 3 months (around 05/11/2021).   Orders:  No orders of the defined types were placed in this encounter.  No orders of the defined types were placed in this encounter.     Procedures: No procedures performed   Clinical Data: No additional findings.   Subjective: Chief Complaint  Patient presents with  . Lower Back - Follow-up    HPI 45 year old female returns with her second epidural injection states she is doing much better.  Injection was done at L5-S1 level on the left which is helped her.  She states she had an episode where her leg gave way and she fell this was prior to her epidural.  She saw Dr. Rosiland Oz and she has been using Swede-O on the left ankle.  Still has numbness in her left fourth and fifth toe and some problems with toe flexion of those 2 toes.  Epidural does did help her back pain.  Gabapentin made her break out with a rash so she stopped the gabapentin.  She did use some  tramadol.  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  Review of Systems updated unchanged from previous note.   Objective: Vital Signs: BP (!) 156/103   Ht 5' 2.5" (1.588 m)   Wt 197 lb (89.4 kg)   LMP  (LMP Unknown)   BMI 35.46 kg/m   Physical Exam Constitutional:      Appearance: She is well-developed.  HENT:     Head: Normocephalic.     Right Ear: External ear normal.     Left Ear: External ear normal.  Eyes:     Pupils: Pupils are equal, round, and reactive to light.  Neck:     Thyroid: No thyromegaly.     Trachea: No tracheal deviation.  Cardiovascular:     Rate and Rhythm: Normal rate.  Pulmonary:     Effort: Pulmonary effort is normal.  Abdominal:     Palpations: Abdomen is soft.  Skin:    General: Skin is warm and dry.  Neurological:     Mental Status: She is alert and oriented to person, place, and time.  Psychiatric:        Mood and Affect: Mood and  affect normal.        Behavior: Behavior normal.     Ortho Exam patient has trace left ankle dorsiflexion weakness.  Improves with encouragement.  No counter pulling of the gastrocsoleus.  EHL trace weak on the left normal on the right.  Specialty Comments:  No specialty comments available.  Imaging: No results found.   PMFS History: Patient Active Problem List   Diagnosis Date Noted  . Sprain of left ankle 01/17/2021  . Recurrent herniation of lumbar disc 10/30/2020  . Prediabetes 09/24/2020  . Post-operative state 06/04/2020  . Visit for routine gyn exam 11/29/2019  . Encounter for screening mammogram for breast cancer 11/29/2019  . History of bilateral tubal ligation 11/29/2019  . S/P cesarean section 07/14/2016  . Hypertension 07/05/2011   Past Medical History:  Diagnosis Date  . Carpal tunnel syndrome on right   . Chlamydia   . Gestational diabetes mellitus   . Hypertension    no medicaqtions at this time  . Migraines    . Ovarian cyst   . S/P lumbar discectomy 04/10/2015  . Tendonitis    right wrist  . Trichomonas   . Type 2 diabetes mellitus (HCC)    not currently taking medication    Family History  Problem Relation Age of Onset  . Hypertension Maternal Aunt   . Hypertension Paternal Aunt   . Club foot Daughter   . Hirschsprung's disease Daughter   . Learning disabilities Daughter   . Polydactyly Daughter   . Hypertension Mother   . Diabetes Mother   . Heart disease Father   . Anesthesia problems Neg Hx   . Hypotension Neg Hx   . Malignant hyperthermia Neg Hx   . Pseudochol deficiency Neg Hx     Past Surgical History:  Procedure Laterality Date  . ABDOMINAL HYSTERECTOMY    . BACK SURGERY    . CESAREAN SECTION  12/27/2011   Procedure: CESAREAN SECTION;  Surgeon: Shelly Bombard, MD;  Location: [facility];  Service: Gynecology;  Laterality: N/A;  Primary cesarean section with delivery of baby girl at 73. Apgars 3/3/6  . CESAREAN SECTION N/A 08/03/2016   Procedure: CESAREAN SECTION;  Surgeon: Osborne Oman, MD;  Location: [facility];  Service: Obstetrics;  Laterality: N/A;  . CHOLECYSTECTOMY  02/2009  . DILATION AND CURETTAGE OF UTERUS N/A 06/25/2014   Procedure: DILATATION AND CURETTAGE;  Surgeon: Donnamae Jude, MD;  Location: [facility];  Service: Gynecology;  Laterality: N/A;  . LUMBAR LAMINECTOMY/DECOMPRESSION MICRODISCECTOMY Left 04/10/2015   Procedure: Left L5-S1 Microdiscectomy;  Surgeon: Marybelle Killings, MD;  Location: [facility];  Service: Orthopedics;  Laterality: Left;  Marland Kitchen VAGINAL HYSTERECTOMY N/A 06/04/2020   Procedure: HYSTERECTOMY VAGINAL, MORCELLATION;  Surgeon: Chancy Milroy, MD;  Location: [facility];  Service: Gynecology;  Laterality: N/A;  . WISDOM TOOTH EXTRACTION     Social History   Occupational History  . Not on file  Tobacco Use  . Smoking status: Current Every Day Smoker    Packs/day: 0.25    Years: 16.00    Pack years: 4.00    Types: Cigarettes  .  Smokeless tobacco: Never Used  Vaping Use  . Vaping Use: Never used  Substance and Sexual Activity  . Alcohol use: Yes    Comment: occ  . Drug use: Yes    Types: Marijuana    Comment: occ  . Sexual activity: Not on file

## 2021-02-20 ENCOUNTER — Ambulatory Visit: Payer: Self-pay | Admitting: Nurse Practitioner

## 2021-03-04 ENCOUNTER — Ambulatory Visit: Payer: 59 | Admitting: Family Medicine

## 2021-03-04 ENCOUNTER — Ambulatory Visit (HOSPITAL_BASED_OUTPATIENT_CLINIC_OR_DEPARTMENT_OTHER)
Admission: RE | Admit: 2021-03-04 | Discharge: 2021-03-04 | Disposition: A | Discharge status: Home / Self Care | Payer: 59 | Source: Ambulatory Visit | Attending: Family Medicine | Admitting: Family Medicine

## 2021-03-04 ENCOUNTER — Other Ambulatory Visit: Payer: Self-pay

## 2021-03-04 DIAGNOSIS — S86312D Strain of muscle(s) and tendon(s) of peroneal muscle group at lower leg level, left leg, subsequent encounter: Secondary | ICD-10-CM | POA: Diagnosis present

## 2021-03-04 NOTE — Patient Instructions (Signed)
Good to see you Please use ice as needed  I will call with the xray results from today  Please send me a message in MyChart with any questions or updates.  We will setup a virtual visit once the MRI is resulted.   --Dr. Raeford Razor

## 2021-03-04 NOTE — Progress Notes (Signed)
Meredith York - 45 y.o. female MRN [id number]  Date of birth: Oct 23, 1976  SUBJECTIVE:  Including CC & ROS.  No chief complaint on file.   Meredith York is a 45 y.o. female that is presenting with ongoing left ankle pain.  She has tried the cam walker and ASO brace.  She feels like she has reached a plateau.  She still has weakness and pain as she goes up and down stairs.   Review of Systems See HPI   HISTORY: Past Medical, Surgical, Social, and Family History Reviewed & Updated per EMR.   Pertinent Historical Findings include:  Past Medical History:  Diagnosis Date  . Carpal tunnel syndrome on right   . Chlamydia   . Gestational diabetes mellitus   . Hypertension    no medicaqtions at this time  . Migraines   . Ovarian cyst   . S/P lumbar discectomy 04/10/2015  . Tendonitis    right wrist  . Trichomonas   . Type 2 diabetes mellitus ([facility])    not currently taking medication    Past Surgical History:  Procedure Laterality Date  . ABDOMINAL HYSTERECTOMY    . BACK SURGERY    . CESAREAN SECTION  12/27/2011   Procedure: CESAREAN SECTION;  Surgeon: Shelly Bombard, MD;  Location: [facility];  Service: Gynecology;  Laterality: N/A;  Primary cesarean section with delivery of baby girl at 73. Apgars 3/3/6  . CESAREAN SECTION N/A 08/03/2016   Procedure: CESAREAN SECTION;  Surgeon: Osborne Oman, MD;  Location: [facility];  Service: Obstetrics;  Laterality: N/A;  . CHOLECYSTECTOMY  02/2009  . DILATION AND CURETTAGE OF UTERUS N/A 06/25/2014   Procedure: DILATATION AND CURETTAGE;  Surgeon: Donnamae Jude, MD;  Location: [facility];  Service: Gynecology;  Laterality: N/A;  . LUMBAR LAMINECTOMY/DECOMPRESSION MICRODISCECTOMY Left 04/10/2015   Procedure: Left L5-S1 Microdiscectomy;  Surgeon: Marybelle Killings, MD;  Location: [facility];  Service: Orthopedics;  Laterality: Left;  Marland Kitchen VAGINAL HYSTERECTOMY N/A 06/04/2020   Procedure: HYSTERECTOMY VAGINAL, MORCELLATION;  Surgeon: Chancy Milroy, MD;   Location: [facility];  Service: Gynecology;  Laterality: N/A;  . WISDOM TOOTH EXTRACTION      Family History  Problem Relation Age of Onset  . Hypertension Maternal Aunt   . Hypertension Paternal Aunt   . Club foot Daughter   . Hirschsprung's disease Daughter   . Learning disabilities Daughter   . Polydactyly Daughter   . Hypertension Mother   . Diabetes Mother   . Heart disease Father   . Anesthesia problems Neg Hx   . Hypotension Neg Hx   . Malignant hyperthermia Neg Hx   . Pseudochol deficiency Neg Hx     Social History   Socioeconomic History  . Marital status: Single    Spouse name: Not on file  . Number of children: Not on file  . Years of education: Not on file  . Highest education level: Not on file  Occupational History  . Not on file  Tobacco Use  . Smoking status: Current Every Day Smoker    Packs/day: 0.25    Years: 16.00    Pack years: 4.00    Types: Cigarettes  . Smokeless tobacco: Never Used  Vaping Use  . Vaping Use: Never used  Substance and Sexual Activity  . Alcohol use: Yes    Comment: occ  . Drug use: Yes    Types: Marijuana    Comment: occ  . Sexual activity: Not on  file  Other Topics Concern  . Not on file  Social History Narrative  . Not on file   Social Determinants of Health   Financial Resource Strain: Not on file  Food Insecurity: Not on file  Transportation Needs: Not on file  Physical Activity: Not on file  Stress: Not on file  Social Connections: Not on file  Intimate Partner Violence: Not on file     PHYSICAL EXAM:  VS: BP 120/80   Ht 5' 2.5" (1.588 m)   Wt 197 lb (89.4 kg)   LMP  (LMP Unknown)   BMI 35.46 kg/m  Physical Exam Gen: NAD, alert, cooperative with exam, well-appearing MSK:  Left ankle: Weakness with inversion and eversion. No translation with anterior drawer. No swelling or ecchymosis. Neurovascularly intact     ASSESSMENT & PLAN:   Peroneal tendon tear, left, subsequent  encounter Injury occurred on January and still having pain and weakness.  Concerned that she may have peroneal tendons tear given the initial ultrasound findings. -Counseled on home exercise therapy and supportive care. -Continue ASO brace. -X-ray - MRI to evaluate for tendon tear.

## 2021-03-04 NOTE — Assessment & Plan Note (Addendum)
Injury occurred on January and still having pain and weakness.  Concerned that she may have peroneal tendons tear given the initial ultrasound findings. -Counseled on home exercise therapy and supportive care. -Continue ASO brace. -X-ray - MRI to evaluate for tendon tear.

## 2021-03-06 ENCOUNTER — Telehealth: Payer: Self-pay | Admitting: Family Medicine

## 2021-03-06 NOTE — Telephone Encounter (Signed)
Informed of xray results.   Rosemarie Ax, MD [facility] 03/06/2021, 9:19 AM

## 2021-03-15 ENCOUNTER — Other Ambulatory Visit: Payer: Self-pay

## 2021-03-15 ENCOUNTER — Ambulatory Visit (HOSPITAL_BASED_OUTPATIENT_CLINIC_OR_DEPARTMENT_OTHER)
Admission: RE | Admit: 2021-03-15 | Discharge: 2021-03-15 | Disposition: A | Discharge status: Home / Self Care | Payer: 59 | Source: Ambulatory Visit | Attending: Family Medicine | Admitting: Family Medicine

## 2021-03-15 DIAGNOSIS — S86312D Strain of muscle(s) and tendon(s) of peroneal muscle group at lower leg level, left leg, subsequent encounter: Secondary | ICD-10-CM | POA: Diagnosis present

## 2021-03-24 ENCOUNTER — Other Ambulatory Visit: Payer: Self-pay

## 2021-03-24 ENCOUNTER — Telehealth (INDEPENDENT_AMBULATORY_CARE_PROVIDER_SITE_OTHER): Payer: 59 | Admitting: Family Medicine

## 2021-03-24 DIAGNOSIS — S86312D Strain of muscle(s) and tendon(s) of peroneal muscle group at lower leg level, left leg, subsequent encounter: Secondary | ICD-10-CM | POA: Diagnosis not present

## 2021-03-24 NOTE — Progress Notes (Signed)
Virtual Visit via Video Note  I connected with Meredith York on 03/24/21 at  1:20 PM EDT by a video enabled telemedicine application and verified that I am speaking with the correct person using two identifiers.  Location: Patient: work  Provider: office    I discussed the limitations of evaluation and management by telemedicine and the availability of in person appointments. The patient expressed understanding and agreed to proceed.  History of Present Illness:  Meredith York is a 45 year old female that is following up after MRI of her left ankle.  It was demonstrating substantial peroneus longus tendinopathy and partial tearing.  There is also tendinopathy of the peroneus brevis.   Observations/Objective:  Gen: NAD, alert, cooperative with exam, well-appearing  Assessment and Plan:  Left peroneal tendon partial tear: MRI was demonstrating tendinopathy of the peroneus brevis and peroneus longus.  Also partial tearing. -Counseled on home exercise therapy and supportive care. -Counseled on use of the cam walker. -Can try injection. -Could consider nitro patches or physical therapy.  Follow Up Instructions:    I discussed the assessment and treatment plan with the patient. The patient was provided an opportunity to ask questions and all were answered. The patient agreed with the plan and demonstrated an understanding of the instructions.   The patient was advised to call back or seek an in-person evaluation if the symptoms worsen or if the condition fails to improve as anticipated.   Clearance Coots, MD

## 2021-03-25 ENCOUNTER — Ambulatory Visit: Payer: 59 | Admitting: Nurse Practitioner

## 2021-03-25 ENCOUNTER — Encounter: Payer: Self-pay | Admitting: Nurse Practitioner

## 2021-03-25 NOTE — Assessment & Plan Note (Signed)
MRI was demonstrating tendinopathy of the peroneus brevis and peroneus longus.  Also partial tearing. -Counseled on home exercise therapy and supportive care. -Counseled on use of the cam walker. -Can try injection. -Could consider nitro patches or physical therapy.

## 2021-03-26 ENCOUNTER — Encounter: Payer: Self-pay | Admitting: Nurse Practitioner

## 2021-03-26 ENCOUNTER — Other Ambulatory Visit: Payer: Self-pay

## 2021-03-26 ENCOUNTER — Ambulatory Visit: Payer: 59 | Admitting: Nurse Practitioner

## 2021-03-26 VITALS — BP 142/100 | HR 101 | Temp 98.3°F | Ht 62.5 in | Wt 200.2 lb

## 2021-03-26 DIAGNOSIS — E559 Vitamin D deficiency, unspecified: Secondary | ICD-10-CM | POA: Diagnosis not present

## 2021-03-26 DIAGNOSIS — I10 Essential (primary) hypertension: Secondary | ICD-10-CM

## 2021-03-26 DIAGNOSIS — J302 Other seasonal allergic rhinitis: Secondary | ICD-10-CM | POA: Diagnosis not present

## 2021-03-26 DIAGNOSIS — R7303 Prediabetes: Secondary | ICD-10-CM

## 2021-03-26 DIAGNOSIS — S86312D Strain of muscle(s) and tendon(s) of peroneal muscle group at lower leg level, left leg, subsequent encounter: Secondary | ICD-10-CM

## 2021-03-26 DIAGNOSIS — Z6836 Body mass index (BMI) 36.0-36.9, adult: Secondary | ICD-10-CM

## 2021-03-26 MED ORDER — AMLODIPINE BESYLATE 5 MG PO TABS
5.0000 mg | ORAL_TABLET | Freq: Every day | ORAL | 1 refills | Status: DC
Start: 2021-03-26 — End: 2023-05-31

## 2021-03-26 NOTE — Patient Instructions (Addendum)
Diabetes Mellitus and Exercise Exercising regularly is important for overall health, especially for people who have diabetes mellitus. Exercising is not only about losing weight. It has many other health benefits, such as increasing muscle strength and bone density and reducing body fat and stress. This leads to improved fitness, flexibility, and endurance, all of which result in better overall health. What are the benefits of exercise if I have diabetes? Exercise has many benefits for people with diabetes. They include:  Helping to lower and control blood sugar (glucose).  Helping the body to respond better to the hormone insulin by improving insulin sensitivity.  Reducing how much insulin the body needs.  Lowering the risk for heart disease by: ? Lowering "bad" cholesterol and triglyceride levels. ? Increasing "good" cholesterol levels. ? Lowering blood pressure. ? Lowering blood glucose levels. What is my activity plan? Your health care provider or certified diabetes educator can help you make a plan for the type and frequency of exercise that works for you. This is called your activity plan. Be sure to:  Get at least 150 minutes of medium-intensity or high-intensity exercise each week. Exercises may include brisk walking, biking, or water aerobics.  Do stretching and strengthening exercises, such as yoga or weight lifting, at least 2 times a week.  Spread out your activity over at least 3 days of the week.  Get some form of physical activity each day. ? Do not go more than 2 days in a row without some kind of physical activity. ? Avoid being inactive for more than 90 minutes at a time. Take frequent breaks to walk or stretch.  Choose exercises or activities that you enjoy. Set realistic goals.  Start slowly and gradually increase your exercise intensity over time.   How do I manage my diabetes during exercise? Monitor your blood glucose  Check your blood glucose before and  after exercising. If your blood glucose is: ? 240 mg/dL (13.3 mmol/L) or higher before you exercise, check your urine for ketones. These are chemicals created by the liver. If you have ketones in your urine, do not exercise until your blood glucose returns to normal. ? 100 mg/dL (5.6 mmol/L) or lower, eat a snack containing 15-20 grams of carbohydrate. Check your blood glucose 15 minutes after the snack to make sure that your glucose level is above 100 mg/dL (5.6 mmol/L) before you start your exercise.  Know the symptoms of low blood glucose (hypoglycemia) and how to treat it. Your risk for hypoglycemia increases during and after exercise. Follow these tips and your health care provider's instructions  Keep a carbohydrate snack that is fast-acting for use before, during, and after exercise to help prevent or treat hypoglycemia.  Avoid injecting insulin into areas of the body that are going to be exercised. For example, avoid injecting insulin into: ? Your arms, when you are about to play tennis. ? Your legs, when you are about to go jogging.  Keep records of your exercise habits. Doing this can help you and your health care provider adjust your diabetes management plan as needed. Write down: ? Food that you eat before and after you exercise. ? Blood glucose levels before and after you exercise. ? The type and amount of exercise you have done.  Work with your health care provider when you start a new exercise or activity. He or she may need to: ? Make sure that the activity is safe for you. ? Adjust your insulin, other medicines, and food that   you eat.  Drink plenty of water while you exercise. This prevents loss of water (dehydration) and problems caused by a lot of heat in the body (heat stroke).   Where to find more information  American Diabetes Association: www.diabetes.org Summary  Exercising regularly is important for overall health, especially for people who have diabetes  mellitus.  Exercising has many health benefits. It increases muscle strength and bone density and reduces body fat and stress. It also lowers and controls blood glucose.  Your health care provider or certified diabetes educator can help you make an activity plan for the type and frequency of exercise that works for you.  Work with your health care provider to make sure any new activity is safe for you. Also work with your health care provider to adjust your insulin, other medicines, and the food you eat. This information is not intended to replace advice given to you by your health care provider. Make sure you discuss any questions you have with your health care provider. Document Revised: 09/04/2019 Document Reviewed: 09/04/2019 Elsevier Patient Education  2021 Elsevier Inc. Hypertension, Adult Hypertension is another name for high blood pressure. High blood pressure forces your heart to work harder to pump blood. This can cause problems over time. There are two numbers in a blood pressure reading. There is a top number (systolic) over a bottom number (diastolic). It is best to have a blood pressure that is below 120/80. Healthy choices can help lower your blood pressure, or you may need medicine to help lower it. What are the causes? The cause of this condition is not known. Some conditions may be related to high blood pressure. What increases the risk?  Smoking.  Having type 2 diabetes mellitus, high cholesterol, or both.  Not getting enough exercise or physical activity.  Being overweight.  Having too much fat, sugar, calories, or salt (sodium) in your diet.  Drinking too much alcohol.  Having long-term (chronic) kidney disease.  Having a family history of high blood pressure.  Age. Risk increases with age.  Race. You may be at higher risk if you are African American.  Gender. Men are at higher risk than women before age 45. After age 65, women are at higher risk than  men.  Having obstructive sleep apnea.  Stress. What are the signs or symptoms?  High blood pressure may not cause symptoms. Very high blood pressure (hypertensive crisis) may cause: ? Headache. ? Feelings of worry or nervousness (anxiety). ? Shortness of breath. ? Nosebleed. ? A feeling of being sick to your stomach (nausea). ? Throwing up (vomiting). ? Changes in how you see. ? Very bad chest pain. ? Seizures. How is this treated?  This condition is treated by making healthy lifestyle changes, such as: ? Eating healthy foods. ? Exercising more. ? Drinking less alcohol.  Your health care provider may prescribe medicine if lifestyle changes are not enough to get your blood pressure under control, and if: ? Your top number is above 130. ? Your bottom number is above 80.  Your personal target blood pressure may vary. Follow these instructions at home: Eating and drinking  If told, follow the DASH eating plan. To follow this plan: ? Fill one half of your plate at each meal with fruits and vegetables. ? Fill one fourth of your plate at each meal with whole grains. Whole grains include whole-wheat pasta, brown rice, and whole-grain bread. ? Eat or drink low-fat dairy products, such as skim milk or   low-fat yogurt. ? Fill one fourth of your plate at each meal with low-fat (lean) proteins. Low-fat proteins include fish, chicken without skin, eggs, beans, and tofu. ? Avoid fatty meat, cured and processed meat, or chicken with skin. ? Avoid pre-made or processed food.  Eat less than 1,500 mg of salt each day.  Do not drink alcohol if: ? Your doctor tells you not to drink. ? You are pregnant, may be pregnant, or are planning to become pregnant.  If you drink alcohol: ? Limit how much you use to:  0-1 drink a day for women.  0-2 drinks a day for men. ? Be aware of how much alcohol is in your drink. In the [place] one drink equals one 12 oz bottle of beer (355 mL), one 5 oz glass  of wine (148 mL), or one 1 oz glass of hard liquor (44 mL).   Lifestyle  Work with your doctor to stay at a healthy weight or to lose weight. Ask your doctor what the best weight is for you.  Get at least 30 minutes of exercise most days of the week. This may include walking, swimming, or biking.  Get at least 30 minutes of exercise that strengthens your muscles (resistance exercise) at least 3 days a week. This may include lifting weights or doing Pilates.  Do not use any products that contain nicotine or tobacco, such as cigarettes, e-cigarettes, and chewing tobacco. If you need help quitting, ask your doctor.  Check your blood pressure at home as told by your doctor.  Keep all follow-up visits as told by your doctor. This is important.   Medicines  Take over-the-counter and prescription medicines only as told by your doctor. Follow directions carefully.  Do not skip doses of blood pressure medicine. The medicine does not work as well if you skip doses. Skipping doses also puts you at risk for problems.  Ask your doctor about side effects or reactions to medicines that you should watch for. Contact a doctor if you:  Think you are having a reaction to the medicine you are taking.  Have headaches that keep coming back (recurring).  Feel dizzy.  Have swelling in your ankles.  Have trouble with your vision. Get help right away if you:  Get a very bad headache.  Start to feel mixed up (confused).  Feel weak or numb.  Feel faint.  Have very bad pain in your: ? Chest. ? Belly (abdomen).  Throw up more than once.  Have trouble breathing. Summary  Hypertension is another name for high blood pressure.  High blood pressure forces your heart to work harder to pump blood.  For most people, a normal blood pressure is less than 120/80.  Making healthy choices can help lower blood pressure. If your blood pressure does not get lower with healthy choices, you may need to  take medicine. This information is not intended to replace advice given to you by your health care provider. Make sure you discuss any questions you have with your health care provider. Document Revised: 08/17/2018 Document Reviewed: 08/17/2018 Elsevier Patient Education  2021 [place].   Take zyrtec daily at bedtime and use nasal spray for allergies (Flonase), also you can get opcon drops or pataday drops for your eyes.

## 2021-03-26 NOTE — Progress Notes (Signed)
Rutherford Nail as a scribe for Minette Brine, FNP.,have documented all relevant documentation on the behalf of Minette Brine, FNP,as directed by  Minette Brine, FNP while in the presence of Minette Brine, [place]. This visit occurred during the SARS-CoV-2 public health emergency.  Safety protocols were in place, including screening questions prior to the visit, additional usage of staff PPE, and extensive cleaning of exam room while observing appropriate contact time as indicated for disinfecting solutions.  Subjective:     Patient ID: Meredith York , female    DOB: 1976-06-22 , 45 y.o.   MRN: [id number]   Chief Complaint  Patient presents with  . Hypertension    HPI  Patient presents today for a f/u on her prediabetes and blood pressure. Continues to take the HCTZ mostly everyday.  She does report a history of snoring.   Wt Readings from Last 3 Encounters: 03/26/21 : 200 lb 3.2 oz (90.8 kg) 03/24/21 : 197 lb (89.4 kg) 03/04/21 : 197 lb (89.4 kg)  Hypertension This is a chronic problem. The current episode started more than 1 year ago. The problem is uncontrolled. Pertinent negatives include no anxiety, chest pain, headaches or palpitations. There are no associated agents to hypertension. Risk factors for coronary artery disease include obesity and sedentary lifestyle. There is no history of angina or kidney disease. There is no history of chronic renal disease.     Past Medical History:  Diagnosis Date  . Carpal tunnel syndrome on right   . Chlamydia   . Gestational diabetes mellitus   . Hypertension    no medicaqtions at this time  . Migraines   . Ovarian cyst   . S/P lumbar discectomy 04/10/2015  . Tendonitis    right wrist  . Trichomonas   . Type 2 diabetes mellitus ([facility])    not currently taking medication     Family History  Problem Relation Age of Onset  . Hypertension Maternal Aunt   . Hypertension Paternal Aunt   . Club foot Daughter   . Hirschsprung's disease  Daughter   . Learning disabilities Daughter   . Polydactyly Daughter   . Hypertension Mother   . Diabetes Mother   . Heart disease Father   . Anesthesia problems Neg Hx   . Hypotension Neg Hx   . Malignant hyperthermia Neg Hx   . Pseudochol deficiency Neg Hx      Current Outpatient Medications:  .  amLODipine (NORVASC) 5 MG tablet, Take 1 tablet (5 mg total) by mouth daily., Disp: 90 tablet, Rfl: 1 .  hydrochlorothiazide (HYDRODIURIL) 25 MG tablet, Take 1 tablet (25 mg total) by mouth daily., Disp: 90 tablet, Rfl: 1 .  Semaglutide (RYBELSUS) 7 MG TABS, Take 1 tablet by mouth daily. Take 30 minutes before breakfast, Disp: 90 tablet, Rfl: 0 .  traMADol (ULTRAM) 50 MG tablet, TAKE 1 TABLET(50 MG) BY MOUTH EVERY 6 HOURS AS NEEDED (Patient not taking: Reported on 03/26/2021), Disp: 30 tablet, Rfl: 0 .  Vitamin D, Ergocalciferol, (DRISDOL) 1.25 MG (50000 UNIT) CAPS capsule, Take 1 capsule (50,000 Units total) by mouth 2 (two) times a week. (Patient not taking: Reported on 03/26/2021), Disp: 24 capsule, Rfl: 1   Allergies  Allergen Reactions  . Amoxicillin Hives and Itching    Has patient had a PCN reaction causing immediate rash, facial/tongue/throat swelling, SOB or lightheadedness with hypotension: No Has patient had a PCN reaction causing severe rash involving mucus membranes or skin necrosis: No Has patient had a PCN  reaction that required hospitalization No Has patient had a PCN reaction occurring within the last 10 years: No If all of the above answers are "NO", then may proceed with Cephalosporin use.   Marland Kitchen Percocet [Oxycodone-Acetaminophen] Itching  . Gabapentin Rash    Made face break out     Review of Systems  Constitutional: Negative.  Negative for fatigue.  HENT: Negative.   Cardiovascular: Negative for chest pain, palpitations and leg swelling.  Endocrine: Negative for polydipsia, polyphagia and polyuria.  Musculoskeletal: Negative.   Skin: Negative.   Neurological: Negative  for dizziness and headaches.  Psychiatric/Behavioral: Negative.      Today's Vitals   03/26/21 1415  BP: (!) 142/100  Pulse: (!) 101  Temp: 98.3 F (36.8 C)  TempSrc: Oral  Weight: 200 lb 3.2 oz (90.8 kg)  Height: 5' 2.5" (1.588 m)  PainSc: 6   PainLoc: Foot   Body mass index is 36.03 kg/m.   Objective:  Physical Exam Vitals reviewed.  Constitutional:      General: She is not in acute distress.    Appearance: Normal appearance. She is obese.  Cardiovascular:     Rate and Rhythm: Normal rate and regular rhythm.     Pulses: Normal pulses.     Heart sounds: Normal heart sounds. No murmur heard.   Pulmonary:     Effort: Pulmonary effort is normal. No respiratory distress.     Breath sounds: Normal breath sounds. No wheezing.  Neurological:     General: No focal deficit present.     Mental Status: She is alert and oriented to person, place, and time.     Cranial Nerves: No cranial nerve deficit.     Motor: No weakness.  Psychiatric:        Mood and Affect: Mood normal.        Behavior: Behavior normal.        Thought Content: Thought content normal.        Judgment: Judgment normal.         Assessment And Plan:     1. Uncontrolled hypertension  Blood pressure is elevated today, I will add amlodipine  She is also encouraged to limit her salt intake to a 2 gram sodium diet.  - amLODipine (NORVASC) 5 MG tablet; Take 1 tablet (5 mg total) by mouth daily.  Dispense: 90 tablet; Refill: 1  2. Prediabetes  Chronic, no current medications  Stable  Encouraged to exercise regularly  3. Vitamin D deficiency  Will check vitamin D level and supplement as needed.     Also encouraged to spend 15 minutes in the sun daily.   4. BMI 36.0-36.9,adult  Chronic  Discussed healthy diet and regular exercise options   Encouraged to exercise at least 150 minutes per week with 2 days of strength training  5. Peroneal tendon tear, left, subsequent encounter  This is  limiting her from exercising regularly  Encouraged to do water aerobics or chair exercises  6. Seasonal allergies Chronic, she is encouraged to take an over the counter antihistamine daily   Patient was given opportunity to ask questions. Patient verbalized understanding of the plan and was able to repeat key elements of the plan. All questions were answered to their satisfaction.  Minette Brine, FNP   I, Minette Brine, FNP, have reviewed all documentation for this visit. The documentation on 04/01/21 for the exam, diagnosis, procedures, and orders are all accurate and complete.   IF YOU HAVE BEEN REFERRED TO A SPECIALIST,  IT MAY TAKE 1-2 WEEKS TO SCHEDULE/PROCESS THE REFERRAL. IF YOU HAVE NOT HEARD FROM US/SPECIALIST IN TWO WEEKS, PLEASE GIVE [place] A CALL AT [phone].   THE PATIENT IS ENCOURAGED TO PRACTICE SOCIAL DISTANCING DUE TO THE COVID-19 PANDEMIC.

## 2021-04-08 ENCOUNTER — Ambulatory Visit: Payer: Self-pay

## 2021-04-08 ENCOUNTER — Other Ambulatory Visit: Payer: Self-pay

## 2021-04-08 ENCOUNTER — Encounter: Payer: Self-pay | Admitting: Family Medicine

## 2021-04-08 ENCOUNTER — Ambulatory Visit (INDEPENDENT_AMBULATORY_CARE_PROVIDER_SITE_OTHER): Payer: 59 | Admitting: Family Medicine

## 2021-04-08 DIAGNOSIS — S86312D Strain of muscle(s) and tendon(s) of peroneal muscle group at lower leg level, left leg, subsequent encounter: Secondary | ICD-10-CM | POA: Diagnosis not present

## 2021-04-08 MED ORDER — NITROGLYCERIN 0.2 MG/HR TD PT24: MEDICATED_PATCH | TRANSDERMAL | 11 refills | Status: DC

## 2021-04-08 MED ORDER — METHYLPREDNISOLONE ACETATE 40 MG/ML IJ SUSP
40.0000 mg | Freq: Once | INTRAMUSCULAR | Status: AC
  Administered 2021-04-08: 40 mg via INTRA_ARTICULAR

## 2021-04-08 NOTE — Patient Instructions (Signed)
Good to see you Please use the boot for one more week.  Please try the nitro patches after the week.  Please try the exercise when the pain has improved.   Please send me a message in MyChart with any questions or updates.  Please see me back in 4 weeks.   --Dr. Raeford Razor  Nitroglycerin Protocol   Apply 1/4 nitroglycerin patch to affected area daily.  Change position of patch within the affected area every 24 hours.  You may experience a headache during the first 1-2 weeks of using the patch, these should subside.  If you experience headaches after beginning nitroglycerin patch treatment, you may take your preferred over the counter pain reliever.  Another side effect of the nitroglycerin patch is skin irritation or rash related to patch adhesive.  Please notify our office if you develop more severe headaches or rash, and stop the patch.  Tendon healing with nitroglycerin patch may require 12 to 24 weeks depending on the extent of injury.  Men should not use if taking Viagra, Cialis, or Levitra.   Do not use if you have migraines or rosacea.

## 2021-04-08 NOTE — Addendum Note (Signed)
Addended by: Cresenciano Lick on: 04/08/2021 10:21 AM   Modules accepted: Orders

## 2021-04-08 NOTE — Assessment & Plan Note (Addendum)
Acutely still having pain.  MRI was confirming a partial tear. -Counseled on exercise therapy and supportive care. -Counseled on weaning out of the boot. - injection today  -Counseled on initiating the nitro patches. -Could consider physical therapy -Provided work note.

## 2021-04-08 NOTE — Progress Notes (Signed)
Meredith York - 45 y.o. female MRN [id number]  Date of birth: June 11, 1976  SUBJECTIVE:  Including CC & ROS.  No chief complaint on file.   Meredith York is a 45 y.o. female that is presenting with ongoing left foot pain.  Here for an injection   Review of Systems See HPI   HISTORY: Past Medical, Surgical, Social, and Family History Reviewed & Updated per EMR.   Pertinent Historical Findings include:  Past Medical History:  Diagnosis Date  . Carpal tunnel syndrome on right   . Chlamydia   . Gestational diabetes mellitus   . Hypertension    no medicaqtions at this time  . Migraines   . Ovarian cyst   . S/P lumbar discectomy 04/10/2015  . Tendonitis    right wrist  . Trichomonas   . Type 2 diabetes mellitus ([facility])    not currently taking medication    Past Surgical History:  Procedure Laterality Date  . ABDOMINAL HYSTERECTOMY    . BACK SURGERY    . CESAREAN SECTION  12/27/2011   Procedure: CESAREAN SECTION;  Surgeon: Meredith Bombard, MD;  Location: [facility];  Service: Gynecology;  Laterality: N/A;  Primary cesarean section with delivery of baby girl at 36. Apgars 3/3/6  . CESAREAN SECTION N/A 08/03/2016   Procedure: CESAREAN SECTION;  Surgeon: Meredith Oman, MD;  Location: [facility];  Service: Obstetrics;  Laterality: N/A;  . CHOLECYSTECTOMY  02/2009  . DILATION AND CURETTAGE OF UTERUS N/A 06/25/2014   Procedure: DILATATION AND CURETTAGE;  Surgeon: Meredith Jude, MD;  Location: [facility];  Service: Gynecology;  Laterality: N/A;  . LUMBAR LAMINECTOMY/DECOMPRESSION MICRODISCECTOMY Left 04/10/2015   Procedure: Left L5-S1 Microdiscectomy;  Surgeon: Meredith Killings, MD;  Location: [facility];  Service: Orthopedics;  Laterality: Left;  Marland Kitchen VAGINAL HYSTERECTOMY N/A 06/04/2020   Procedure: HYSTERECTOMY VAGINAL, MORCELLATION;  Surgeon: Meredith Milroy, MD;  Location: [facility];  Service: Gynecology;  Laterality: N/A;  . WISDOM TOOTH EXTRACTION      Family History   Problem Relation Age of Onset  . Hypertension Maternal Aunt   . Hypertension Paternal Aunt   . Club foot Daughter   . Hirschsprung's disease Daughter   . Learning disabilities Daughter   . Polydactyly Daughter   . Hypertension Mother   . Diabetes Mother   . Heart disease Father   . Anesthesia problems Neg Hx   . Hypotension Neg Hx   . Malignant hyperthermia Neg Hx   . Pseudochol deficiency Neg Hx     Social History   Socioeconomic History  . Marital status: Single    Spouse name: Not on file  . Number of children: Not on file  . Years of education: Not on file  . Highest education level: Not on file  Occupational History  . Not on file  Tobacco Use  . Smoking status: Current Every Day Smoker    Packs/day: 0.25    Years: 24.00    Pack years: 6.00    Types: Cigarettes  . Smokeless tobacco: Never Used  . Tobacco comment: she has cut back - sometimes will not smoke at all.   Vaping Use  . Vaping Use: Never used  Substance and Sexual Activity  . Alcohol use: Yes    Comment: occ  . Drug use: Yes    Types: Marijuana    Comment: occ  . Sexual activity: Not on file  Other Topics Concern  . Not on file  Social  History Narrative  . Not on file   Social Determinants of Health   Financial Resource Strain: Not on file  Food Insecurity: Not on file  Transportation Needs: Not on file  Physical Activity: Not on file  Stress: Not on file  Social Connections: Not on file  Intimate Partner Violence: Not on file   Limited ultrasound: Left ankle:  Split tear of the flexor houses longus appreciated.  Summary: Ongoing tear of the flexor pollicis longus  Ultrasound and interpretation by Meredith Coots, MD   PHYSICAL EXAM:  VS: BP (!) 168/100 (BP Location: Right Arm, Patient Position: Sitting, Cuff Size: Large)   Ht 5' 2.5" (1.588 m)   Wt 200 lb (90.7 kg)   LMP  (LMP Unknown)   BMI 36.00 kg/m  Physical Exam Gen: NAD, alert, cooperative with exam,  well-appearing   Aspiration/Injection Procedure Note Meredith York May 13, 1976  Procedure: Injection Indications: Left foot pain  Procedure Details Consent: Risks of procedure as well as the alternatives and risks of each were explained to the (patient/caregiver).  Consent for procedure obtained. Time Out: Verified patient identification, verified procedure, site/side was marked, verified correct patient position, special equipment/implants available, medications/allergies/relevent history reviewed, required imaging and test results available.  Performed.  The area was cleaned with iodine and alcohol swabs.    The left peroneal tendon sheath was injected using 2 cc of 1% lidocaine on a 25-gauge 1-1/2 inch needle.  The syringe was switched to mixture containing 1 cc's of 40 mg Depo-Medrol and 1 cc's of 0.25% bupivacaine was injected.  Ultrasound was used. Images were obtained in long views showing the injection.     A sterile dressing was applied.  Patient did tolerate procedure well.      ASSESSMENT & PLAN:   Peroneal tendon tear, left, subsequent encounter Acutely still having pain.  MRI was confirming a partial tear. -Counseled on exercise therapy and supportive care. -Counseled on weaning out of the boot. - injection today  -Counseled on initiating the nitro patches. -Could consider physical therapy -Provided work note.

## 2021-04-15 ENCOUNTER — Ambulatory Visit: Payer: 59

## 2021-04-18 ENCOUNTER — Ambulatory Visit: Payer: 59

## 2021-04-18 ENCOUNTER — Other Ambulatory Visit: Payer: Self-pay

## 2021-04-18 VITALS — BP 132/80 | HR 72 | Ht 64.0 in | Wt 203.2 lb

## 2021-04-18 DIAGNOSIS — I10 Essential (primary) hypertension: Secondary | ICD-10-CM

## 2021-04-18 NOTE — Progress Notes (Signed)
Patient presents today for a blood pressure f/u.  BP Readings from Last 3 Encounters:  04/18/21 132/80  04/08/21 (!) 168/100  03/26/21 (!) 142/100    Patient advised that she will have a f/u appt on 05/22/21 with provider. YL,RMA

## 2021-05-08 ENCOUNTER — Encounter: Payer: Self-pay | Admitting: Family Medicine

## 2021-05-08 ENCOUNTER — Other Ambulatory Visit: Payer: Self-pay

## 2021-05-08 ENCOUNTER — Ambulatory Visit (INDEPENDENT_AMBULATORY_CARE_PROVIDER_SITE_OTHER): Payer: Self-pay | Admitting: Family Medicine

## 2021-05-08 DIAGNOSIS — S86312D Strain of muscle(s) and tendon(s) of peroneal muscle group at lower leg level, left leg, subsequent encounter: Secondary | ICD-10-CM

## 2021-05-08 NOTE — Assessment & Plan Note (Signed)
Pain has resolved since the injection. -Counseled on home exercise therapy and supportive care. -Counseled on nitro patches. -Provided work note. -Can follow-up as needed.

## 2021-05-08 NOTE — Patient Instructions (Signed)
Good to see you Please continue the exercise  Use the nitro patches for a total of 3 months   Please send me a message in MyChart with any questions or updates.  Please see [place] back as needed.   --Dr. Raeford Razor

## 2021-05-08 NOTE — Progress Notes (Signed)
Meredith York - 45 y.o. female MRN [id number]  Date of birth: [birth year]  SUBJECTIVE:  Including CC & ROS.  No chief complaint on file.   Meredith York is a 45 y.o. female that is following up for her left foot pain.  She is got significant improvement since the injection.  Review of Systems See HPI   HISTORY: Past Medical, Surgical, Social, and Family History Reviewed & Updated per EMR.   Pertinent Historical Findings include:  Past Medical History:  Diagnosis Date  . Carpal tunnel syndrome on right   . Chlamydia   . Gestational diabetes mellitus   . Hypertension    no medicaqtions at this time  . Migraines   . Ovarian cyst   . S/P lumbar discectomy 04/10/2015  . Tendonitis    right wrist  . Trichomonas   . Type 2 diabetes mellitus ([place])    not currently taking medication    Past Surgical History:  Procedure Laterality Date  . ABDOMINAL HYSTERECTOMY    . BACK SURGERY    . CESAREAN SECTION  12/27/2011   Procedure: CESAREAN SECTION;  Surgeon: Shelly Bombard, MD;  Location: [facility];  Service: Gynecology;  Laterality: N/A;  Primary cesarean section with delivery of baby girl at 34. Apgars 3/3/6  . CESAREAN SECTION N/A 08/03/2016   Procedure: CESAREAN SECTION;  Surgeon: Osborne Oman, MD;  Location: [place];  Service: Obstetrics;  Laterality: N/A;  . CHOLECYSTECTOMY  02/2009  . DILATION AND CURETTAGE OF UTERUS N/A 06/25/2014   Procedure: DILATATION AND CURETTAGE;  Surgeon: Donnamae Jude, MD;  Location: [facility];  Service: Gynecology;  Laterality: N/A;  . LUMBAR LAMINECTOMY/DECOMPRESSION MICRODISCECTOMY Left 04/10/2015   Procedure: Left L5-S1 Microdiscectomy;  Surgeon: Marybelle Killings, MD;  Location: [facility];  Service: Orthopedics;  Laterality: Left;  Marland Kitchen VAGINAL HYSTERECTOMY N/A 06/04/2020   Procedure: HYSTERECTOMY VAGINAL, MORCELLATION;  Surgeon: Chancy Milroy, MD;  Location: [facility];  Service: Gynecology;  Laterality: N/A;  . WISDOM TOOTH EXTRACTION       Family History  Problem Relation Age of Onset  . Hypertension Maternal Aunt   . Hypertension Paternal Aunt   . Club foot Daughter   . Hirschsprung's disease Daughter   . Learning disabilities Daughter   . Polydactyly Daughter   . Hypertension Mother   . Diabetes Mother   . Heart disease Father   . Anesthesia problems Neg Hx   . Hypotension Neg Hx   . Malignant hyperthermia Neg Hx   . Pseudochol deficiency Neg Hx     Social History   Socioeconomic History  . Marital status: Single    Spouse name: Not on file  . Number of children: Not on file  . Years of education: Not on file  . Highest education level: Not on file  Occupational History  . Not on file  Tobacco Use  . Smoking status: Current Every Day Smoker    Packs/day: 0.25    Years: 24.00    Pack years: 6.00    Types: Cigarettes  . Smokeless tobacco: Never Used  . Tobacco comment: she has cut back - sometimes will not smoke at all.   Vaping Use  . Vaping Use: Never used  Substance and Sexual Activity  . Alcohol use: Yes    Comment: occ  . Drug use: Yes    Types: Marijuana    Comment: occ  . Sexual activity: Not on file  Other Topics Concern  . Not  on file  Social History Narrative  . Not on file   Social Determinants of Health   Financial Resource Strain: Not on file  Food Insecurity: Not on file  Transportation Needs: Not on file  Physical Activity: Not on file  Stress: Not on file  Social Connections: Not on file  Intimate Partner Violence: Not on file     PHYSICAL EXAM:  VS: BP 130/78 (BP Location: Left Arm, Patient Position: Sitting, Cuff Size: Large)   Ht 5\' 4"  (1.626 m)   Wt 203 lb (92.1 kg)   LMP  (LMP Unknown)   BMI 34.84 kg/m  Physical Exam Gen: NAD, alert, cooperative with exam, well-appearing    ASSESSMENT & PLAN:   Peroneal tendon tear, left, subsequent encounter Pain has resolved since the injection. -Counseled on home exercise therapy and supportive care. -Counseled  on nitro patches. -Provided work note. -Can follow-up as needed.

## 2021-05-13 ENCOUNTER — Ambulatory Visit: Payer: 59 | Admitting: Orthopaedic Surgery

## 2021-05-22 ENCOUNTER — Ambulatory Visit: Payer: 59 | Admitting: Nurse Practitioner

## 2021-06-11 IMAGING — US US PELVIS COMPLETE WITH TRANSVAGINAL
1 series · 13 of 25 positions shown · non-contrast
Comparison: 10/31/2019

CLINICAL DATA: Menorrhagia, premenopausal

EXAM:
TRANSABDOMINAL AND TRANSVAGINAL ULTRASOUND OF PELVIS
TECHNIQUE: Both transabdominal and transvaginal ultrasound examinations of the
pelvis were performed. Transabdominal technique was performed for
global imaging of the pelvis including uterus, ovaries, adnexal
regions, and pelvic cul-de-sac. It was necessary to proceed with
endovaginal exam following the transabdominal exam to visualize the
endometrium and ovaries.

[Series 1: us pelvis complete with transvaginal · 0.23mm/px · 13 of 60 slices shown]
[im 1/60]
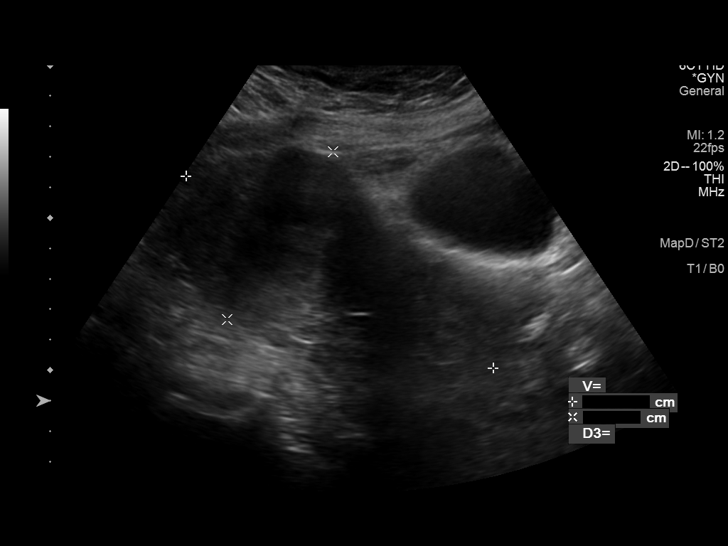
[im 5/60]
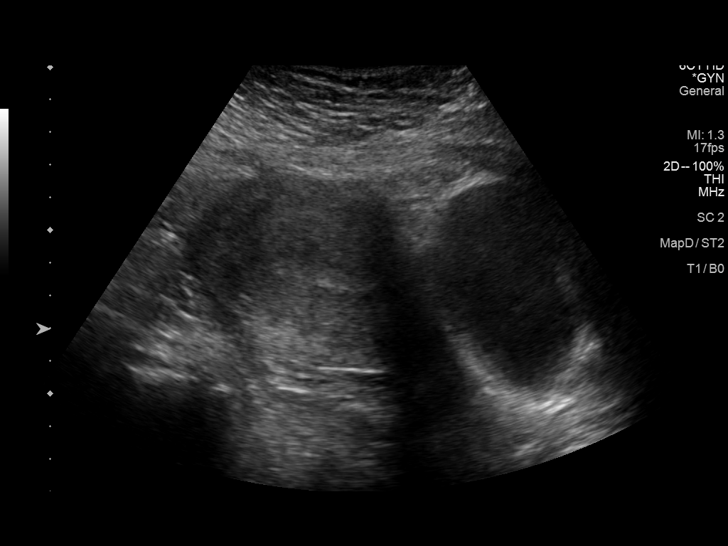
[im 10/60]
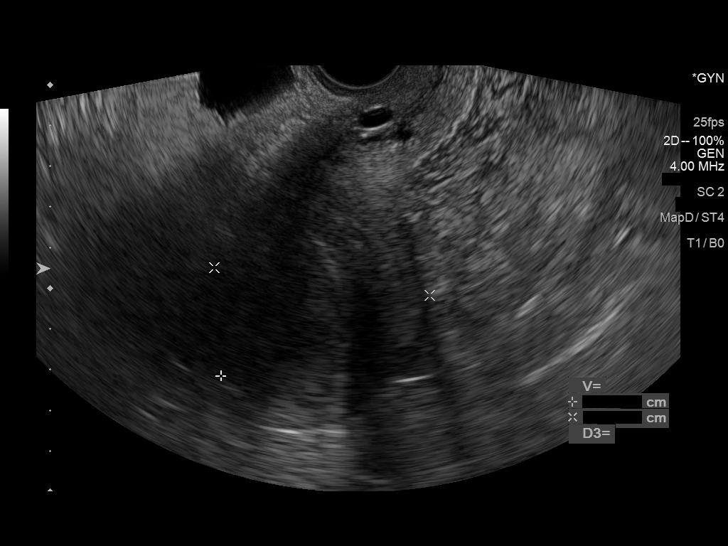
[im 15/60]
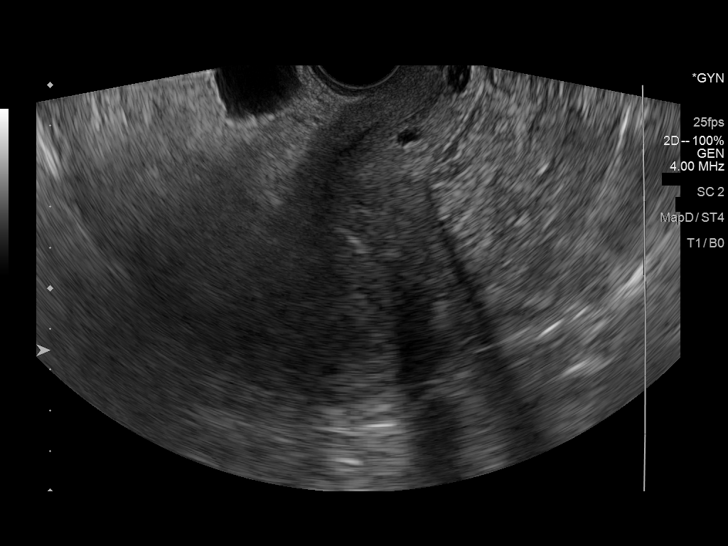
[im 20/60]
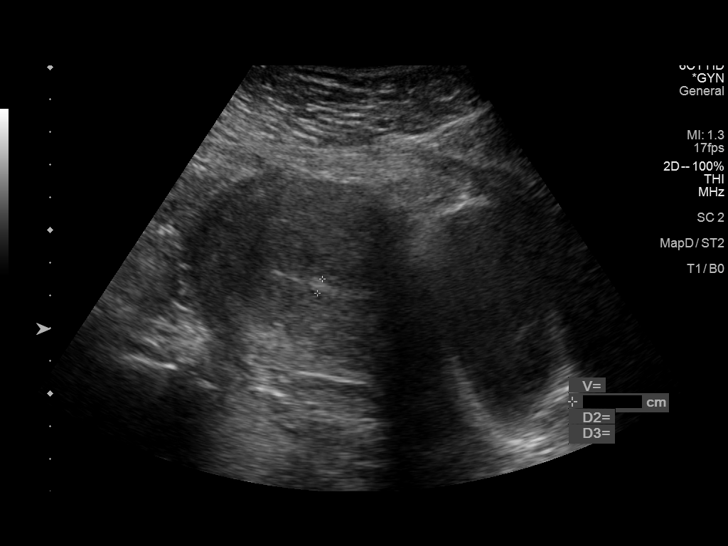
[im 25/60]
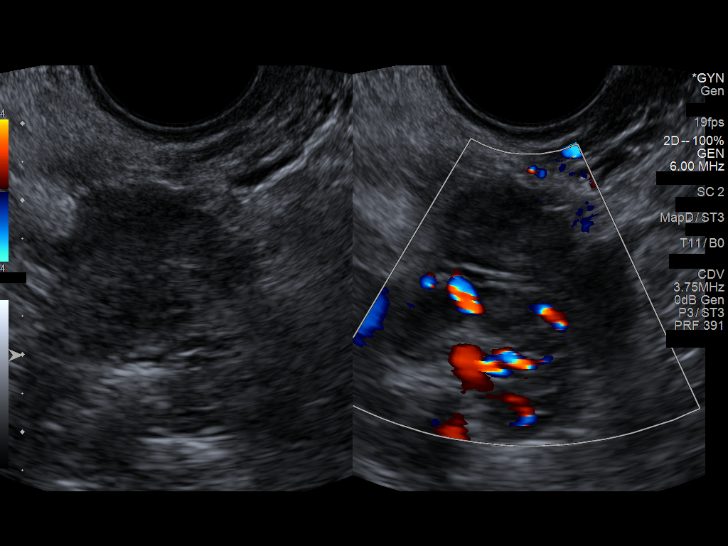
[im 30/60]
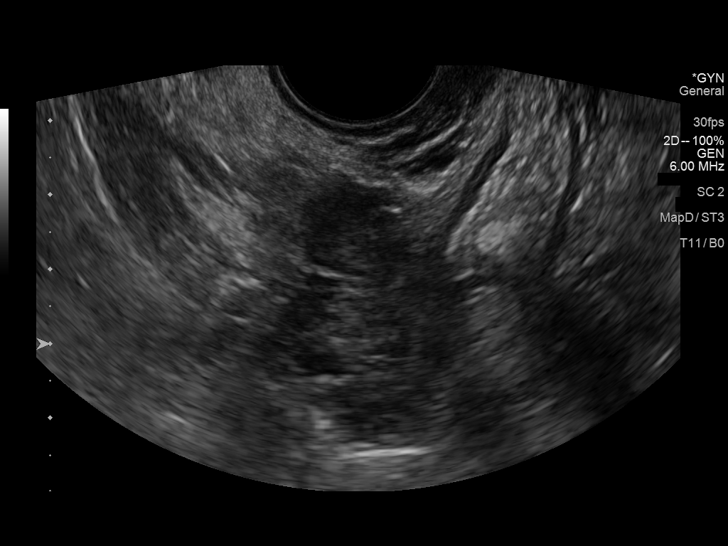
[im 35/60]
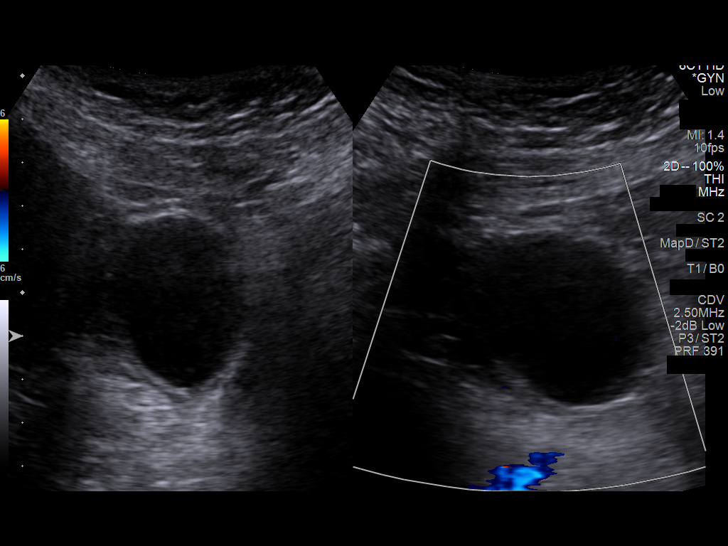
[im 40/60]
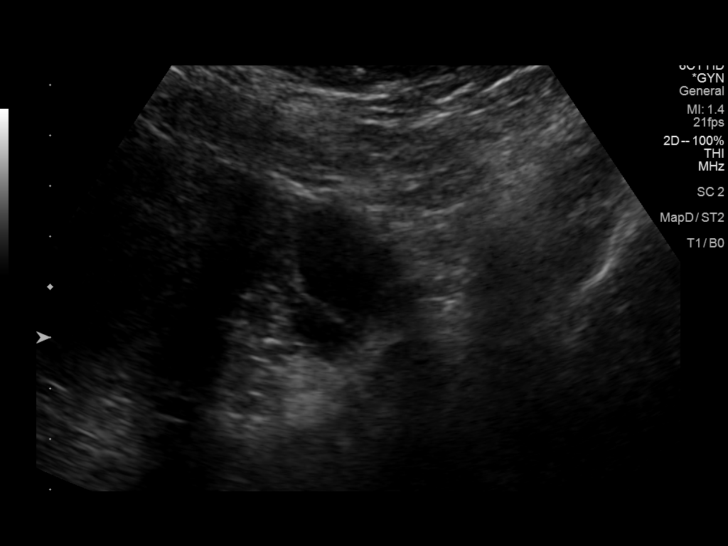
[im 45/60]
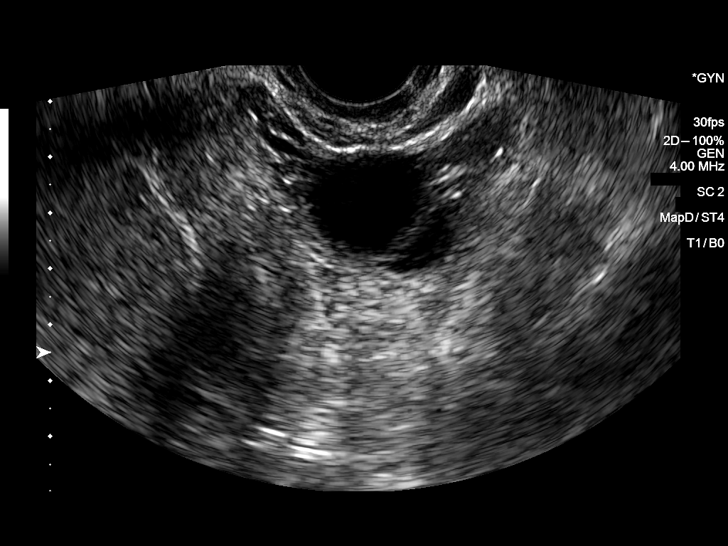
[im 50/60]
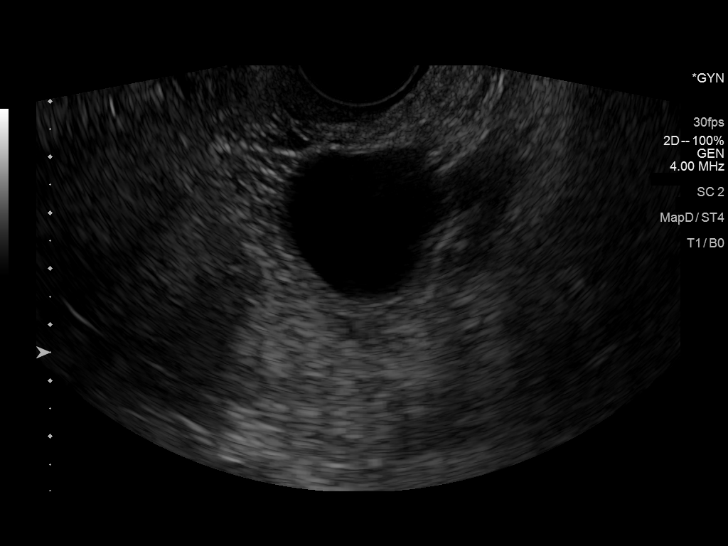
[im 55/60]
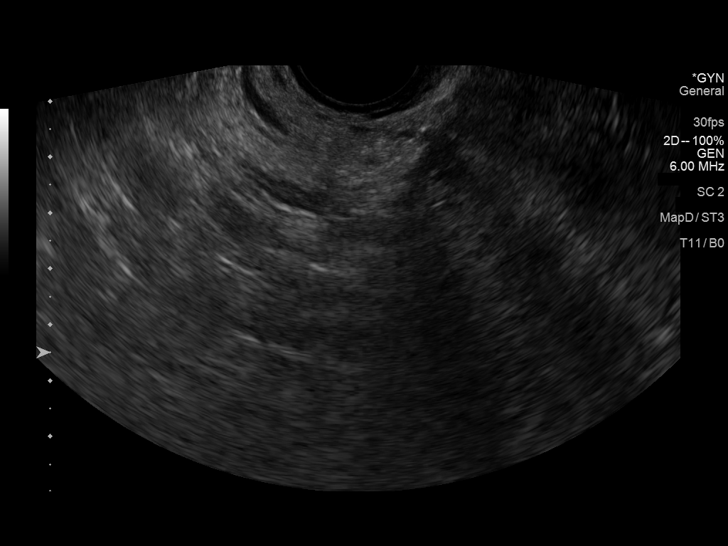
[im 60/60]
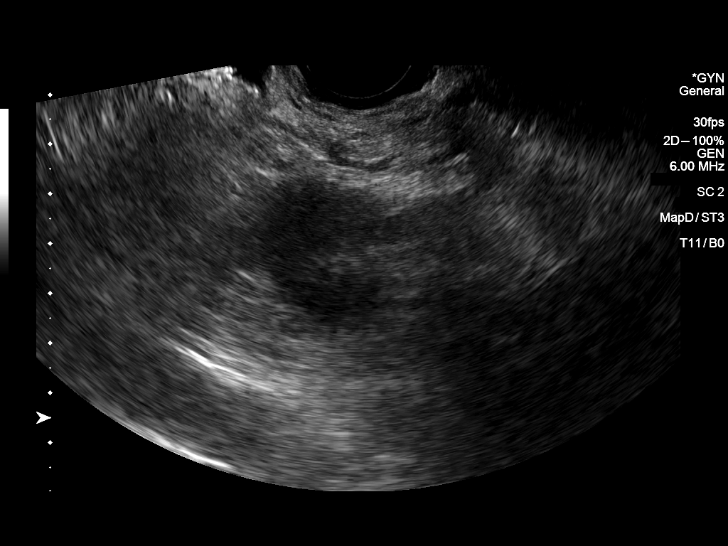

[13 of 25 positions shown; findings below may reference images not displayed]

FINDINGS: Uterus

Measurements: 12.0 x 6.5 x 6.0 cm = volume: 225 mL. Anteverted and
mildly retroflexed. Nabothian cysts at cervix. Heterogeneous
myometrium. Areas of shadowing due to position of uterine fundus, no
gross uterine mass identified

Endometrium

Thickness: 5 mm.  No endometrial fluid or focal abnormalities

Right ovary

Measurements: 2.5 x 2.3 x 1.8 cm = volume: 5.5 mL. Normal morphology
without mass

Left ovary

Measurements: 4.1 x 4.5 x 5.0 cm = volume: 48 ML. Simple cyst 2.9 x
3.9 x 3.3 cm. No additional masses.

Other findings

No free pelvic fluid or adnexal other adnexal masses identified.
IMPRESSION: Retroflexed uterus limiting fundal assessment, no gross uterine
abnormalities identified.

Normal thickness endometrial complex 5 mm thick.

Simple cyst LEFT ovary 3.9 cm greatest diameter with otherwise
normal appearing ovaries.

## 2021-09-29 ENCOUNTER — Encounter: Payer: 59 | Admitting: Nurse Practitioner

## 2022-05-04 ENCOUNTER — Emergency Department (HOSPITAL_BASED_OUTPATIENT_CLINIC_OR_DEPARTMENT_OTHER)
Admission: EM | Admit: 2022-05-04 | Discharge: 2022-05-04 | Disposition: A | Discharge status: Home / Self Care | Payer: Self-pay | Attending: Emergency Medicine | Admitting: Emergency Medicine

## 2022-05-04 ENCOUNTER — Encounter (HOSPITAL_BASED_OUTPATIENT_CLINIC_OR_DEPARTMENT_OTHER): Payer: Self-pay | Admitting: Emergency Medicine

## 2022-05-04 ENCOUNTER — Other Ambulatory Visit: Payer: Self-pay

## 2022-05-04 DIAGNOSIS — N3000 Acute cystitis without hematuria: Secondary | ICD-10-CM | POA: Insufficient documentation

## 2022-05-04 DIAGNOSIS — Z79899 Other long term (current) drug therapy: Secondary | ICD-10-CM | POA: Insufficient documentation

## 2022-05-04 LAB — URINALYSIS, ROUTINE W REFLEX MICROSCOPIC
Bilirubin Urine: NEGATIVE
Glucose, UA: NEGATIVE mg/dL
Ketones, ur: NEGATIVE mg/dL
Nitrite: POSITIVE — AB
Protein, ur: 30 mg/dL — AB
Specific Gravity, Urine: 1.02 (ref 1.005–1.030)
pH: 6 (ref 5.0–8.0)

## 2022-05-04 LAB — URINALYSIS, MICROSCOPIC (REFLEX): WBC, UA: >50 WBC/hpf (ref 0–5)

## 2022-05-04 MED ORDER — NITROFURANTOIN MONOHYD MACRO 100 MG PO CAPS: 100.0000 mg | ORAL_CAPSULE | Freq: Two times a day (BID) | ORAL | 0 refills | Status: DC

## 2022-05-04 MED ORDER — PHENAZOPYRIDINE HCL 200 MG PO TABS
200.0000 mg | ORAL_TABLET | Freq: Three times a day (TID) | ORAL | 0 refills | Status: DC
Start: 2022-05-04 — End: 2023-01-18

## 2022-05-04 NOTE — ED Triage Notes (Addendum)
Dysuria x 3 days   states  urine has an odor, denies vag d/c ?

## 2022-05-04 NOTE — ED Provider Notes (Signed)
?[facility] EMERGENCY DEPARTMENT ?Provider Note ? ? ?CSN: [id number] ?Arrival date & time: 05/04/22  1015 ? ?  ? ?History ? ?Chief Complaint  ?Patient presents with  ? Dysuria  ? ? ?Meredith York is a 46 y.o. female. ? ?HPI ? ?  ? ?46 year old female comes in with chief complaint of burning with urination for the last 3 days along with some suprapubic discomfort.  Patient is also noted cloudy urine and foul-smelling urine.  There is no blood.  She denies any vaginal discharge or bleeding.  No flank pain. ? ?Home Medications ?Prior to Admission medications   ?Medication Sig Start Date End Date Taking? Authorizing Provider  ?nitrofurantoin, macrocrystal-monohydrate, (MACROBID) 100 MG capsule Take 1 capsule (100 mg total) by mouth 2 (two) times daily. 05/04/22  Yes Varney Biles, MD  ?phenazopyridine (PYRIDIUM) 200 MG tablet Take 1 tablet (200 mg total) by mouth 3 (three) times daily. 05/04/22  Yes Varney Biles, MD  ?amLODipine (NORVASC) 5 MG tablet Take 1 tablet (5 mg total) by mouth daily. 03/26/21 03/26/22  Minette Brine, FNP  ?hydrochlorothiazide (HYDRODIURIL) 25 MG tablet Take 1 tablet (25 mg total) by mouth daily. 09/24/20   Minette Brine, FNP  ?nitroGLYCERIN (NITRODUR - DOSED IN MG/24 HR) 0.2 mg/hr patch Cut and apply 1/4 patch to most painful area q24h. 04/08/21   Rosemarie Ax, MD  ?Semaglutide (RYBELSUS) 7 MG TABS Take 1 tablet by mouth daily. Take 30 minutes before breakfast 12/25/20   Minette Brine, FNP  ?traMADol (ULTRAM) 50 MG tablet TAKE 1 TABLET(50 MG) BY MOUTH EVERY 6 HOURS AS NEEDED ?Patient not taking: Reported on 03/26/2021 12/30/20   Marybelle Killings, MD  ?Vitamin D, Ergocalciferol, (DRISDOL) 1.25 MG (50000 UNIT) CAPS capsule Take 1 capsule (50,000 Units total) by mouth 2 (two) times a week. ?Patient not taking: Reported on 03/26/2021 01/09/21   Minette Brine, FNP  ?   ? ?Allergies    ?Amoxicillin, Percocet [oxycodone-acetaminophen], and Gabapentin   ? ?Review of Systems   ?Review of  Systems ? ?Physical Exam ?Updated Vital Signs ?BP (!) 140/95 (BP Location: Left Arm)   Pulse 77   Temp 98 ?F (36.7 ?C) (Oral)   Resp 16   Ht '5\' 2"'$  (1.575 m)   Wt 89.8 kg   LMP  (LMP Unknown)   SpO2 100%   BMI 36.21 kg/m?  ?Physical Exam ?Vitals and nursing note reviewed.  ?Constitutional:   ?   Appearance: She is well-developed.  ?HENT:  ?   Head: Atraumatic.  ?Cardiovascular:  ?   Rate and Rhythm: Normal rate.  ?Pulmonary:  ?   Effort: Pulmonary effort is normal.  ?Abdominal:  ?   Tenderness: There is abdominal tenderness. There is no guarding or rebound.  ?Musculoskeletal:  ?   Cervical back: Normal range of motion and neck supple.  ?Skin: ?   General: Skin is warm and dry.  ?Neurological:  ?   Mental Status: She is alert and oriented to person, place, and time.  ? ? ?ED Results / Procedures / Treatments   ?Labs ?(all labs ordered are listed, but only abnormal results are displayed) ?Labs Reviewed  ?URINALYSIS, ROUTINE W REFLEX MICROSCOPIC - Abnormal; Notable for the following components:  ?    Result Value  ? APPearance CLOUDY (*)   ? Hgb urine dipstick MODERATE (*)   ? Protein, ur 30 (*)   ? Nitrite POSITIVE (*)   ? Leukocytes,Ua MODERATE (*)   ? All other components within normal  limits  ?URINALYSIS, MICROSCOPIC (REFLEX) - Abnormal; Notable for the following components:  ? Bacteria, UA MANY (*)   ? All other components within normal limits  ? ? ?EKG ?None ? ?Radiology ?No results found. ? ?Procedures ?Procedures  ? ? ?Medications Ordered in ED ?Medications - No data to display ? ?ED Course/ Medical Decision Making/ A&P ?Clinical Course as of 05/04/22 1121  ?Mon May 04, 2022  ?1120 Urinalysis, Routine w reflex microscopic Urine, Clean Catch(!) ?Positive nitrates, positive leukocytes, positive pyuria and bacteuria [AN]  ?  ?Clinical Course User Index ?[AN] Varney Biles, MD  ? ?                        ?Medical Decision Making ?Amount and/or Complexity of Data Reviewed ?Labs:  ordered. ? ?Risk ?Prescription drug management. ? ? ?46 year old patient comes in with chief complaint of burning with urination. ?She is also having some suprapubic pain. ? ?Differential diagnosis includes diverticulitis, UTI, STD. ? ?She has no vaginal discharge and denies any high risk behavior for STI. ? ?Has no SIRS criteria on arrival.  Clinically she has cystitis. ? ?Urine analysis was sent and reviewed independently.  Patient has nitrate and leukocyte positive urine with pyuria.  We will start her on Macrobid along with Pyridium. ? ?Strict ER return precautions also discussed with the patient. ? ?Final Clinical Impression(s) / ED Diagnoses ?Final diagnoses:  ?Acute cystitis without hematuria  ? ? ?Rx / DC Orders ?ED Discharge Orders   ? ?      Ordered  ?  nitrofurantoin, macrocrystal-monohydrate, (MACROBID) 100 MG capsule  2 times daily       ? 05/04/22 1116  ?  phenazopyridine (PYRIDIUM) 200 MG tablet  3 times daily       ? 05/04/22 1116  ? ?  ?  ? ?  ? ? ?  ?Varney Biles, MD ?05/04/22 1121 ? ?

## 2022-05-04 NOTE — Discharge Instructions (Addendum)
Urine test confirms that you have a bladder infection. Please take the medication prescribed for urinary tract infection. ? ?Please return to the ER if your symptoms worsen; you have increased pain, fevers, chills, inability to keep any medications down, confusion. Otherwise see the outpatient doctor as requested. ? ?

## 2022-05-05 ENCOUNTER — Telehealth: Payer: Self-pay

## 2022-05-05 NOTE — Telephone Encounter (Signed)
Transition Care Management Follow-up Telephone Call ?Date of discharge and from where: 05/04/2022 med center  ?How have you been since you were released from the hospital? She states feeling better ?Any questions or concerns? No ? ?Items Reviewed: ?Did the pt receive and understand the discharge instructions provided? Yes  ?Medications obtained and verified? Yes  ?Other? Yes  ?Any new allergies since your discharge? No  ?Dietary orders reviewed? Yes ?Do you have support at home? Yes  ? ?Home Care and Equipment/Supplies: ?Were home health services ordered? no ?If so, what is the name of the agency? N/a  ?Has the agency set up a time to come to the patient's home? no ?Were any new equipment or medical supplies ordered?  No ?What is the name of the medical supply agency? N/a ?Were you able to get the supplies/equipment? no ?Do you have any questions related to the use of the equipment or supplies? No ? ?Functional Questionnaire: (I = Independent and D = Dependent) ?ADLs: i ? ?Bathing/Dressing- i ? ?Meal Prep- i ? ?Eating- i ? ?Maintaining continence- i ? ?Transferring/Ambulation- i ? ?Managing Meds- i ? ?Follow up appointments reviewed: ? ?PCP Hospital f/u appt confirmed? No  Scheduled to see n/a on n/a @ n/a. ?[facility] f/u appt confirmed? No  Scheduled to see n/a on n/a @ n/a. ?Are transportation arrangements needed? No  ?If their condition worsens, is the pt aware to call PCP or go to the Emergency Dept.? Yes ?Was the patient provided with contact information for the PCP's office or ED? Yes ?Was to pt encouraged to call back with questions or concerns? Yes  ?

## 2022-05-06 ENCOUNTER — Ambulatory Visit: Payer: Self-pay | Admitting: Nurse Practitioner

## 2022-07-21 ENCOUNTER — Emergency Department (HOSPITAL_BASED_OUTPATIENT_CLINIC_OR_DEPARTMENT_OTHER)
Admission: EM | Admit: 2022-07-21 | Discharge: 2022-07-21 | Disposition: A | Discharge status: Home / Self Care | Payer: Self-pay | Attending: Emergency Medicine | Admitting: Emergency Medicine

## 2022-07-21 ENCOUNTER — Emergency Department (HOSPITAL_BASED_OUTPATIENT_CLINIC_OR_DEPARTMENT_OTHER): Payer: Self-pay

## 2022-07-21 ENCOUNTER — Encounter (HOSPITAL_BASED_OUTPATIENT_CLINIC_OR_DEPARTMENT_OTHER): Payer: Self-pay | Admitting: Emergency Medicine

## 2022-07-21 ENCOUNTER — Other Ambulatory Visit: Payer: Self-pay

## 2022-07-21 DIAGNOSIS — S6991XA Unspecified injury of right wrist, hand and finger(s), initial encounter: Secondary | ICD-10-CM | POA: Insufficient documentation

## 2022-07-21 DIAGNOSIS — Y9389 Activity, other specified: Secondary | ICD-10-CM | POA: Insufficient documentation

## 2022-07-21 DIAGNOSIS — W230XXA Caught, crushed, jammed, or pinched between moving objects, initial encounter: Secondary | ICD-10-CM | POA: Insufficient documentation

## 2022-07-21 DIAGNOSIS — R000 Tachycardia, unspecified: Secondary | ICD-10-CM | POA: Insufficient documentation

## 2022-07-21 NOTE — ED Notes (Signed)
Discharge instructions reviewed with patient. Patient verbalizes understanding, no further questions at this time. Medications and follow up information provided. No acute distress noted at time of departure.  

## 2022-07-21 NOTE — ED Triage Notes (Signed)
Pt arrives pov, steady gait, c/o right index finger injury yesterday, hit on door. Swelling noted.

## 2022-07-21 NOTE — ED Provider Notes (Signed)
[facility] EMERGENCY DEPARTMENT Provider Note   CSN: [id number] Arrival date & time: 07/21/22  1109     History  Chief Complaint  Patient presents with   Finger Injury    Meredith York is a 46 y.o. female who presents emergency department for evaluation of right index finger injury that occurred yesterday.  Patient states that she went to open her storm door, thinking it was unlocked and jammed her finger right into the door.  She thinks that she jammed it.  She has significant tenderness and swelling with decreased range of motion due to the swelling.  She denies numbness and tingling.  She has no other injuries or complaints.  HPI     Home Medications Prior to Admission medications   Medication Sig Start Date End Date Taking? Authorizing Provider  amLODipine (NORVASC) 5 MG tablet Take 1 tablet (5 mg total) by mouth daily. 03/26/21 03/26/22  Minette Brine, FNP  hydrochlorothiazide (HYDRODIURIL) 25 MG tablet Take 1 tablet (25 mg total) by mouth daily. 09/24/20   Minette Brine, FNP  nitrofurantoin, macrocrystal-monohydrate, (MACROBID) 100 MG capsule Take 1 capsule (100 mg total) by mouth 2 (two) times daily. 05/04/22   Varney Biles, MD  nitroGLYCERIN (NITRODUR - DOSED IN MG/24 HR) 0.2 mg/hr patch Cut and apply 1/4 patch to most painful area q24h. 04/08/21   Rosemarie Ax, MD  phenazopyridine (PYRIDIUM) 200 MG tablet Take 1 tablet (200 mg total) by mouth 3 (three) times daily. 05/04/22   Varney Biles, MD  Semaglutide (RYBELSUS) 7 MG TABS Take 1 tablet by mouth daily. Take 30 minutes before breakfast 12/25/20   Minette Brine, FNP  traMADol (ULTRAM) 50 MG tablet TAKE 1 TABLET(50 MG) BY MOUTH EVERY 6 HOURS AS NEEDED Patient not taking: Reported on 03/26/2021 12/30/20   Marybelle Killings, MD  Vitamin D, Ergocalciferol, (DRISDOL) 1.25 MG (50000 UNIT) CAPS capsule Take 1 capsule (50,000 Units total) by mouth 2 (two) times a week. Patient not taking: Reported on 03/26/2021 01/09/21   Minette Brine, FNP      Allergies    Amoxicillin, Percocet [oxycodone-acetaminophen], and Gabapentin    Review of Systems   Review of Systems  Musculoskeletal:  Positive for arthralgias and joint swelling.  Neurological:  Negative for numbness.    Physical Exam Updated Vital Signs BP (!) 147/109 (BP Location: Left Arm)   Pulse (!) 108   Temp 99.3 F (37.4 C) (Oral)   Resp 18   Ht 5' 2.5" (1.588 m)   Wt 85.3 kg   LMP  (LMP Unknown)   SpO2 94%   BMI 33.84 kg/m  Physical Exam Vitals and nursing note reviewed.  Constitutional:      General: She is not in acute distress.    Appearance: She is not ill-appearing.  HENT:     Head: Atraumatic.  Eyes:     Conjunctiva/sclera: Conjunctivae normal.  Cardiovascular:     Rate and Rhythm: Normal rate and regular rhythm.     Pulses: Normal pulses.     Heart sounds: No murmur heard. Pulmonary:     Effort: Pulmonary effort is normal. No respiratory distress.     Breath sounds: Normal breath sounds.  Abdominal:     General: Abdomen is flat. There is no distension.     Palpations: Abdomen is soft.     Tenderness: There is no abdominal tenderness.  Musculoskeletal:        General: Normal range of motion.     Cervical back:  Normal range of motion.     Comments: Right index finger notable for swelling.  Brisk capillary refill.  Range of motion limited due to pain and discomfort.  Skin:    General: Skin is warm and dry.     Capillary Refill: Capillary refill takes less than 2 seconds.  Neurological:     General: No focal deficit present.     Mental Status: She is alert.  Psychiatric:        Mood and Affect: Mood normal.     ED Results / Procedures / Treatments   Labs (all labs ordered are listed, but only abnormal results are displayed) Labs Reviewed - No data to display  EKG None  Radiology DG Finger Index Right  Result Date: 07/21/2022 CLINICAL DATA:  Injured index finger yesterday. EXAM: RIGHT INDEX FINGER 2+V COMPARISON:   Prior hand radiographs from 2018. FINDINGS: The joint spaces are maintained. No acute fracture is identified. IMPRESSION: No acute bony findings. Electronically Signed   By: Marijo Sanes M.D.   On: 07/21/2022 11:38    Procedures Procedures    Medications Ordered in ED Medications - No data to display  ED Course/ Medical Decision Making/ A&P                           Medical Decision Making Amount and/or Complexity of Data Reviewed Radiology: ordered.   46 year old female presents emergency department for right index injury.  Patient is slightly tachycardic upon arrival, however during my evaluation heart rate had normalized.  Right finger is swollen and tender to palpation.  Range of motion limited due to pain and swelling.  I ordered and interpreted x-ray which was negative for fracture or dislocation.  Symptoms most consistent with a finger strain.  I offered option of static finger splint versus buddy taping and patient would like to proceed with the buddy taping.  RICE protocol indicated.  Return precautions discussed and she was discharged home in stable condition.   Final Clinical Impression(s) / ED Diagnoses Final diagnoses:  Injury of finger of right hand, initial encounter    Rx / DC Orders ED Discharge Orders     None         Rodena Piety 07/21/22 1150    Ezequiel Essex, MD 07/21/22 1203

## 2022-07-21 NOTE — Discharge Instructions (Signed)
Fortunately, your x-ray was negative for fractures or dislocation.  You likely sustained a minor sprain when you jammed it into the door yesterday.  Please ice the area as frequently as possible to help with swelling and take Tylenol or Motrin as needed.  We have placed you in a buddy splint here in the emergency department.  You can pick up some tape that your local drugstore and reapply the splint as often as needed until you are comfortable without it.  Please note that it can take a couple of weeks for pain to fully resolve.

## 2022-10-07 LAB — HM DIABETES EYE EXAM

## 2022-12-02 ENCOUNTER — Encounter (HOSPITAL_BASED_OUTPATIENT_CLINIC_OR_DEPARTMENT_OTHER): Payer: Self-pay

## 2022-12-02 ENCOUNTER — Emergency Department (HOSPITAL_BASED_OUTPATIENT_CLINIC_OR_DEPARTMENT_OTHER)
Admission: EM | Admit: 2022-12-02 | Discharge: 2022-12-02 | Disposition: A | Discharge status: Home / Self Care | Payer: Medicaid Other | Attending: Emergency Medicine | Admitting: Emergency Medicine

## 2022-12-02 ENCOUNTER — Emergency Department (HOSPITAL_BASED_OUTPATIENT_CLINIC_OR_DEPARTMENT_OTHER): Payer: Medicaid Other

## 2022-12-02 ENCOUNTER — Telehealth: Payer: Self-pay

## 2022-12-02 DIAGNOSIS — Z79899 Other long term (current) drug therapy: Secondary | ICD-10-CM | POA: Diagnosis not present

## 2022-12-02 DIAGNOSIS — M25512 Pain in left shoulder: Secondary | ICD-10-CM | POA: Insufficient documentation

## 2022-12-02 MED ORDER — IBUPROFEN 800 MG PO TABS: 800.0000 mg | ORAL_TABLET | Freq: Three times a day (TID) | ORAL | 0 refills | Status: DC

## 2022-12-02 NOTE — ED Provider Notes (Signed)
[facility] Provider Note   CSN: [id number] Arrival date & time: 12/02/22  6433     History  Chief Complaint  Patient presents with   Shoulder Pain    [facility] is a 46 y.o. female.  She is here with a complaint of left shoulder pain that is acutely worsened.  She said it has been going on for years but she cannot take the pain anymore.  Worse with movement.  She is right-handed dominant.  Works as a [employer] manager.  No known trauma.  No numbness or weakness.  She is taking Tylenol and ibuprofen without any improvement.  The history is provided by the patient.  Shoulder Pain Location:  Shoulder Shoulder location:  L shoulder Injury: no   Pain details:    Quality:  Shooting and sharp   Radiates to:  L upper arm (neck)   Severity:  Severe   Onset quality:  Gradual   Timing:  Intermittent   Progression:  Worsening Handedness:  Right-handed Dislocation: no   Relieved by:  Nothing Worsened by:  Movement Ineffective treatments:  Acetaminophen and NSAIDs Associated symptoms: decreased range of motion and neck pain   Associated symptoms: no fever, no numbness and no tingling        Home Medications Prior to Admission medications   Medication Sig Start Date End Date Taking? Authorizing Provider  amLODipine (NORVASC) 5 MG tablet Take 1 tablet (5 mg total) by mouth daily. 03/26/21 03/26/22  Minette Brine, FNP  hydrochlorothiazide (HYDRODIURIL) 25 MG tablet Take 1 tablet (25 mg total) by mouth daily. 09/24/20   Minette Brine, FNP  nitrofurantoin, macrocrystal-monohydrate, (MACROBID) 100 MG capsule Take 1 capsule (100 mg total) by mouth 2 (two) times daily. 05/04/22   Varney Biles, MD  nitroGLYCERIN (NITRODUR - DOSED IN MG/24 HR) 0.2 mg/hr patch Cut and apply 1/4 patch to most painful area q24h. 04/08/21   Rosemarie Ax, MD  phenazopyridine (PYRIDIUM) 200 MG tablet Take 1 tablet (200 mg total) by mouth 3 (three) times daily. 05/04/22    Varney Biles, MD  Semaglutide (RYBELSUS) 7 MG TABS Take 1 tablet by mouth daily. Take 30 minutes before breakfast 12/25/20   Minette Brine, FNP  traMADol (ULTRAM) 50 MG tablet TAKE 1 TABLET(50 MG) BY MOUTH EVERY 6 HOURS AS NEEDED Patient not taking: Reported on 03/26/2021 12/30/20   Marybelle Killings, MD  Vitamin D, Ergocalciferol, (DRISDOL) 1.25 MG (50000 UNIT) CAPS capsule Take 1 capsule (50,000 Units total) by mouth 2 (two) times a week. Patient not taking: Reported on 03/26/2021 01/09/21   Minette Brine, FNP      Allergies    Amoxicillin, Percocet [oxycodone-acetaminophen], and Gabapentin    Review of Systems   Review of Systems  Constitutional:  Negative for fever.  Musculoskeletal:  Positive for neck pain.  Neurological:  Negative for weakness and numbness.    Physical Exam Updated Vital Signs BP (!) 178/104 (BP Location: Right Arm)   Pulse 85   Temp 98.2 F (36.8 C) (Oral)   Resp 16   Ht 5' 2.5" (1.588 m)   Wt 87.5 kg   LMP  (LMP Unknown)   SpO2 100%   BMI 34.74 kg/m  Physical Exam Constitutional:      Appearance: Normal appearance. She is well-developed.  HENT:     Head: Normocephalic and atraumatic.  Eyes:     Conjunctiva/sclera: Conjunctivae normal.  Cardiovascular:     Rate and Rhythm: Normal rate and regular rhythm.  Musculoskeletal:        General: Tenderness present. No deformity.     Cervical back: Neck supple.     Comments: She has diffuse tenderness around her left shoulder.  Normal landmarks.  Passive full range of motion.  Elbow and wrist nontender.  Distal pulses motor and sensation intact.  Skin:    General: Skin is warm and dry.     Capillary Refill: Capillary refill takes less than 2 seconds.  Neurological:     General: No focal deficit present.     Mental Status: She is alert.     GCS: GCS eye subscore is 4. GCS verbal subscore is 5. GCS motor subscore is 6.     Sensory: No sensory deficit.     Motor: No weakness.     Gait: Gait normal.     ED  Results / Procedures / Treatments   Labs (all labs ordered are listed, but only abnormal results are displayed) Labs Reviewed - No data to display  EKG None  Radiology DG Shoulder Left  Result Date: 12/02/2022 CLINICAL DATA:  Pain. EXAM: LEFT SHOULDER - 2+ VIEW COMPARISON:  CT chest, 04/02/2016. FINDINGS: There is no evidence of fracture or dislocation. There is no evidence of arthropathy or other focal bone abnormality. Soft tissues are unremarkable. IMPRESSION: No acute displaced fracture, dislocation or significant degenerative change within the LEFT shoulder. Electronically Signed   By: Michaelle Birks M.D.   On: 12/02/2022 08:09    Procedures Procedures    Medications Ordered in ED Medications - No data to display  ED Course/ Medical Decision Making/ A&P                           Medical Decision Making Amount and/or Complexity of Data Reviewed Radiology: ordered.  Risk Prescription drug management.   This patient complains of left shoulder pain; this involves an extensive number of treatment Options and is a complaint that carries with it a high risk of complications and morbidity. The differential includes fracture, dislocation, arthritis, rotator cuff injury, tendinitis, septic joint  I ordered imaging studies which included x-ray left shoulder and I independently    visualized and interpreted imaging which showed no acute findings Previous records obtained and reviewed in epic no recent admissions Social determinants considered, none identified Critical Interventions: None  After the interventions stated above, I reevaluated the patient and found patient to be well-appearing in no distress Admission and further testing considered, no indications for admission or further workup at this time.  Will treat symptomatically with NSAIDs and sling, given contact information for sports medicine follow-up.  Return instructions discussed         Final Clinical  Impression(s) / ED Diagnoses Final diagnoses:  Acute pain of left shoulder    Rx / DC Orders ED Discharge Orders          Ordered    ibuprofen (ADVIL) 800 MG tablet  3 times daily        12/02/22 0819              Hayden Rasmussen, MD 12/02/22 1727

## 2022-12-02 NOTE — Discharge Instructions (Addendum)
You were seen in the emergency department for evaluation of worsening left shoulder pain.  Your x-ray did not show any fracture or dislocation.  This is possibly rotator cuff injury or a tendinitis.  Please use ice to the area especially after any type of exertion.  Ibuprofen 3 times a day with food on your stomach.  Schedule appointment with sports medicine Dr. Raeford Razor

## 2022-12-02 NOTE — Telephone Encounter (Signed)
Transition Care Management Follow-up Telephone Call Date of discharge and from where: 12/02/2022  How have you been since you were released from the hospital? Pt states she is doing alright. She has apt with specialist in the morning. She states her insurance does not kick in until Jan 1st. 12/18 apt she requested to resch.  Any questions or concerns? No  Items Reviewed: Did the pt receive and understand the discharge instructions provided? Yes  Medications obtained and verified? Yes  Other? Yes  Any new allergies since your discharge? No  Dietary orders reviewed? Yes Do you have support at home? Yes   Home Care and Equipment/Supplies: Were home health services ordered? no If so, what is the name of the agency? N/a  Has the agency set up a time to come to the patient's home? no Were any new equipment or medical supplies ordered?  No What is the name of the medical supply agency? N/a Were you able to get the supplies/equipment? no Do you have any questions related to the use of the equipment or supplies? No  Functional Questionnaire: (I = Independent and D = Dependent) ADLs: i  Bathing/Dressing- i  Meal Prep- i  Eating- i  Maintaining continence- i  Transferring/Ambulation- i  Managing Meds- i  Follow up appointments reviewed:  PCP Hospital f/u appt confirmed? No  Scheduled to see n/a on n/a @ n/a. [facility] f/u appt confirmed? Yes  Scheduled to see Dr Raeford Razor on tomorrow  @ 1030. Are transportation arrangements needed? No  If their condition worsens, is the pt aware to call PCP or go to the Emergency Dept.? No Was the patient provided with contact information for the PCP's office or ED? No Was to pt encouraged to call back with questions or concerns? No

## 2022-12-02 NOTE — ED Notes (Signed)
Patient transported to X-ray 

## 2022-12-02 NOTE — ED Triage Notes (Signed)
States left shoulder has been bothering her for years, started hurting worse since yesterday. Difficulty lifting arm.

## 2022-12-03 ENCOUNTER — Encounter: Payer: Self-pay | Admitting: Family Medicine

## 2022-12-03 ENCOUNTER — Ambulatory Visit (INDEPENDENT_AMBULATORY_CARE_PROVIDER_SITE_OTHER): Payer: Medicaid Other | Admitting: Family Medicine

## 2022-12-03 ENCOUNTER — Ambulatory Visit: Payer: Self-pay

## 2022-12-03 VITALS — BP 160/108 | Ht 62.5 in | Wt 193.0 lb

## 2022-12-03 DIAGNOSIS — M7502 Adhesive capsulitis of left shoulder: Secondary | ICD-10-CM | POA: Diagnosis present

## 2022-12-03 DIAGNOSIS — M19019 Primary osteoarthritis, unspecified shoulder: Secondary | ICD-10-CM | POA: Insufficient documentation

## 2022-12-03 DIAGNOSIS — S43432D Superior glenoid labrum lesion of left shoulder, subsequent encounter: Secondary | ICD-10-CM | POA: Insufficient documentation

## 2022-12-03 MED ORDER — TRIAMCINOLONE ACETONIDE 40 MG/ML IJ SUSP
40.0000 mg | Freq: Once | INTRAMUSCULAR | Status: AC
  Administered 2022-12-03: 40 mg via INTRA_ARTICULAR

## 2022-12-03 NOTE — Addendum Note (Signed)
Addended by: Marva Panda on: 12/03/2022 10:37 AM   Modules accepted: Orders

## 2022-12-03 NOTE — Progress Notes (Signed)
[facility] - 46 y.o. female MRN [id number]  Date of birth: 1976-01-25  SUBJECTIVE:  Including CC & ROS.  No chief complaint on file.   [facility] is a 46 y.o. female that is presenting with acute on chronic left shoulder pain.  Pain has acutely gotten worse.  Has limited range of motion.  No injury or inciting event.  Review of the emergency department note from 12/13 shows she was provided ibuprofen Independent review of the left shoulder x-ray from 12/19 shows no acute changes.  Review of Systems See HPI   HISTORY: Past Medical, Surgical, Social, and Family History Reviewed & Updated per EMR.   Pertinent Historical Findings include:  Past Medical History:  Diagnosis Date   Carpal tunnel syndrome on right    Chlamydia    Gestational diabetes mellitus    Hypertension    no medicaqtions at this time   Migraines    Ovarian cyst    S/P lumbar discectomy 04/10/2015   Tendonitis    right wrist   Trichomonas    Type 2 diabetes mellitus ([place])    not currently taking medication    Past Surgical History:  Procedure Laterality Date   ABDOMINAL HYSTERECTOMY     BACK SURGERY     CESAREAN SECTION  12/27/2011   Procedure: CESAREAN SECTION;  Surgeon: Shelly Bombard, MD;  Location: [facility];  Service: Gynecology;  Laterality: N/A;  Primary cesarean section with delivery of baby girl at 65. Apgars 3/3/6   CESAREAN SECTION N/A 08/03/2016   Procedure: CESAREAN SECTION;  Surgeon: Osborne Oman, MD;  Location: [place];  Service: Obstetrics;  Laterality: N/A;   CHOLECYSTECTOMY  02/2009   DILATION AND CURETTAGE OF UTERUS N/A 06/25/2014   Procedure: DILATATION AND CURETTAGE;  Surgeon: Donnamae Jude, MD;  Location: [facility];  Service: Gynecology;  Laterality: N/A;   LUMBAR LAMINECTOMY/DECOMPRESSION MICRODISCECTOMY Left 04/10/2015   Procedure: Left L5-S1 Microdiscectomy;  Surgeon: Marybelle Killings, MD;  Location: [facility];  Service: Orthopedics;  Laterality: Left;   VAGINAL HYSTERECTOMY N/A  06/04/2020   Procedure: HYSTERECTOMY VAGINAL, MORCELLATION;  Surgeon: Chancy Milroy, MD;  Location: [facility];  Service: Gynecology;  Laterality: N/A;   WISDOM TOOTH EXTRACTION       PHYSICAL EXAM:  VS: BP (!) 160/108 (BP Location: Right Arm, Patient Position: Sitting)   Ht 5' 2.5" (1.588 m)   Wt 193 lb (87.5 kg)   LMP  (LMP Unknown)   BMI 34.74 kg/m  Physical Exam Gen: NAD, alert, cooperative with exam, well-appearing MSK:  Neurovascularly intact     Aspiration/Injection Procedure Note Ladonna Vanorder September 30, 1976  Procedure: Injection Indications: left shoulder pain  Procedure Details Consent: Risks of procedure as well as the alternatives and risks of each were explained to the (patient/caregiver).  Consent for procedure obtained. Time Out: Verified patient identification, verified procedure, site/side was marked, verified correct patient position, special equipment/implants available, medications/allergies/relevent history reviewed, required imaging and test results available.  Performed.  The area was cleaned with iodine and alcohol swabs.    The left glenohumeral joint was injected using 1 cc of 1% lidocaine on a 22-gauge 3-1/2 inch needle.  The syringe was switched to mixture containing 1 cc's of 40 mg Kenalog and 4 cc's of 0.25% bupivacaine was injected.  Ultrasound was used. Images were obtained in short views showing the injection.     A sterile dressing was applied.  Patient did tolerate procedure well.  ASSESSMENT & PLAN:   Adhesive capsulitis of left shoulder Acute on chronic in nature.  Symptoms most consistent with a capsulitis.  She has limited range of motion in external rotation. -Counseled on home exercise therapy and supportive care. - injection today  - could consider PT or hydrodilation

## 2022-12-03 NOTE — Assessment & Plan Note (Signed)
Acute on chronic in nature.  Symptoms most consistent with a capsulitis.  She has limited range of motion in external rotation. -Counseled on home exercise therapy and supportive care. - injection today  - could consider PT or hydrodilation

## 2022-12-03 NOTE — Patient Instructions (Signed)
Good to see you Please try heat before exercise and ice after  Please try the exercises  Please do not use the sling   Please send me a message in MyChart with any questions or updates.  Please see me back in 4 weeks.   --Dr. Raeford Razor

## 2022-12-07 ENCOUNTER — Ambulatory Visit: Payer: Medicaid Other | Admitting: Nurse Practitioner

## 2022-12-28 ENCOUNTER — Encounter: Payer: Self-pay | Admitting: Family Medicine

## 2022-12-28 ENCOUNTER — Ambulatory Visit (INDEPENDENT_AMBULATORY_CARE_PROVIDER_SITE_OTHER): Payer: Medicaid Other | Admitting: Family Medicine

## 2022-12-28 VITALS — BP 148/90 | Ht 62.5 in | Wt 197.0 lb

## 2022-12-28 DIAGNOSIS — S43432D Superior glenoid labrum lesion of left shoulder, subsequent encounter: Secondary | ICD-10-CM

## 2022-12-28 MED ORDER — CELECOXIB 200 MG PO CAPS: 200.0000 mg | ORAL_CAPSULE | Freq: Two times a day (BID) | ORAL | 2 refills | Status: DC | PRN

## 2022-12-28 NOTE — Progress Notes (Signed)
  [facility] - 47 y.o. female MRN [id number]  Date of birth: 10/03/1976  SUBJECTIVE:  Including CC & ROS.  No chief complaint on file.   [facility] is a 47 y.o. female that is presenting with worsening of her left shoulder pain.  This is acute on chronic in nature.  Continues to have pain and limited range of motion.  The pain has been present for roughly 2 years intermittently.  We have tried an injection to the joint.    Review of Systems See HPI   HISTORY: Past Medical, Surgical, Social, and Family History Reviewed & Updated per EMR.   Pertinent Historical Findings include:  Past Medical History:  Diagnosis Date   Carpal tunnel syndrome on right    Chlamydia    Gestational diabetes mellitus    Hypertension    no medicaqtions at this time   Migraines    Ovarian cyst    S/P lumbar discectomy 04/10/2015   Tendonitis    right wrist   Trichomonas    Type 2 diabetes mellitus ([place])    not currently taking medication    Past Surgical History:  Procedure Laterality Date   ABDOMINAL HYSTERECTOMY     BACK SURGERY     CESAREAN SECTION  12/27/2011   Procedure: CESAREAN SECTION;  Surgeon: Shelly Bombard, MD;  Location: [facility];  Service: Gynecology;  Laterality: N/A;  Primary cesarean section with delivery of baby girl at 51. Apgars 3/3/6   CESAREAN SECTION N/A 08/03/2016   Procedure: CESAREAN SECTION;  Surgeon: Osborne Oman, MD;  Location: [place];  Service: Obstetrics;  Laterality: N/A;   CHOLECYSTECTOMY  02/2009   DILATION AND CURETTAGE OF UTERUS N/A 06/25/2014   Procedure: DILATATION AND CURETTAGE;  Surgeon: Donnamae Jude, MD;  Location: [facility];  Service: Gynecology;  Laterality: N/A;   LUMBAR LAMINECTOMY/DECOMPRESSION MICRODISCECTOMY Left 04/10/2015   Procedure: Left L5-S1 Microdiscectomy;  Surgeon: Marybelle Killings, MD;  Location: [place];  Service: Orthopedics;  Laterality: Left;   VAGINAL HYSTERECTOMY N/A 06/04/2020   Procedure: HYSTERECTOMY VAGINAL, MORCELLATION;   Surgeon: Chancy Milroy, MD;  Location: [facility];  Service: Gynecology;  Laterality: N/A;   WISDOM TOOTH EXTRACTION       PHYSICAL EXAM:  VS: BP (!) 148/90   Ht 5' 2.5" (1.588 m)   Wt 197 lb (89.4 kg)   LMP  (LMP Unknown)   BMI 35.46 kg/m  Physical Exam Gen: NAD, alert, cooperative with exam, well-appearing MSK:  Left shoulder: Limited external rotation and abduction. Weakness with resistance. Positive decant test. Positive O'Brien's test. Positive speeds test. Neurovascularly intact       ASSESSMENT & PLAN:   Labral tear of shoulder, left, subsequent encounter Acute on chronic in nature.  The pain has been intermittently occurring over the past 2 years.  No improvement with her range of motion with previous injection.  Imaging has been unrevealing.  Now she is presenting with mechanical symptoms and weakness on exam. -Counseled on home exercise therapy and supportive care. -MRI of the left shoulder to evaluate for labral tear or chondral lesion or loose body and for presurgical planning.

## 2022-12-28 NOTE — Assessment & Plan Note (Signed)
Acute on chronic in nature.  The pain has been intermittently occurring over the past 2 years.  No improvement with her range of motion with previous injection.  Imaging has been unrevealing.  Now she is presenting with mechanical symptoms and weakness on exam. -Counseled on home exercise therapy and supportive care. -MRI of the left shoulder to evaluate for labral tear or chondral lesion or loose body and for presurgical planning.

## 2022-12-28 NOTE — Patient Instructions (Signed)
Good to see you Please continue to alternate heat and ice  Please use the celebrex  We'll order the MRI at [facility] imaging  Please send me a message in MyChart with any questions or updates.  We'll setup a virtual visit once the MRI is resulted.   --Dr. Raeford Razor

## 2023-01-13 ENCOUNTER — Ambulatory Visit
Admission: RE | Admit: 2023-01-13 | Discharge: 2023-01-13 | Disposition: A | Discharge status: Home / Self Care | Payer: Medicaid Other | Source: Ambulatory Visit | Attending: Family Medicine | Admitting: Family Medicine

## 2023-01-13 DIAGNOSIS — S43432D Superior glenoid labrum lesion of left shoulder, subsequent encounter: Secondary | ICD-10-CM

## 2023-01-18 ENCOUNTER — Telehealth (INDEPENDENT_AMBULATORY_CARE_PROVIDER_SITE_OTHER): Payer: Medicaid Other | Admitting: Family Medicine

## 2023-01-18 ENCOUNTER — Encounter: Payer: Self-pay | Admitting: Family Medicine

## 2023-01-18 VITALS — Ht 62.5 in | Wt 197.0 lb

## 2023-01-18 DIAGNOSIS — M75112 Incomplete rotator cuff tear or rupture of left shoulder, not specified as traumatic: Secondary | ICD-10-CM | POA: Diagnosis not present

## 2023-01-18 DIAGNOSIS — M75102 Unspecified rotator cuff tear or rupture of left shoulder, not specified as traumatic: Secondary | ICD-10-CM | POA: Insufficient documentation

## 2023-01-18 DIAGNOSIS — M19012 Primary osteoarthritis, left shoulder: Secondary | ICD-10-CM | POA: Diagnosis not present

## 2023-01-18 MED ORDER — NITROGLYCERIN 0.2 MG/HR TD PT24: MEDICATED_PATCH | TRANSDERMAL | 11 refills | Status: DC

## 2023-01-18 NOTE — Assessment & Plan Note (Signed)
MRI was revealing for degenerative changes within the joint and effusion within the biceps tendon sheath. -Counseled on home exercise therapy and supportive care. -Formal physical therapy.

## 2023-01-18 NOTE — Assessment & Plan Note (Signed)
MRI was revealing for a small supraspinatus tear.  No retraction was appreciated. -counseled on home exercise therapy and supportive care. -nitro patches. -Referral to physical therapy. -Could consider injection or shockwave therapy

## 2023-01-18 NOTE — Progress Notes (Signed)
Virtual Visit via Video Note  I connected with [facility] on 01/18/23 at  8:00 AM EST by a video enabled telemedicine application and verified that I am speaking with the correct person using two identifiers.  Location: Patient: vehicle Provider: office   I discussed the limitations of evaluation and management by telemedicine and the availability of in person appointments. The patient expressed understanding and agreed to proceed.  History of Present Illness:  M[facility] is a 47 year old female that is following up after the MRI of her left shoulder.  This is showing a partial-thickness and mid substance tear of the supraspinatus that is 10 mm.  Having moderate degenerative changes of the [facility] joint and mild glenohumeral degenerative changes.   Observations/Objective:   Assessment and Plan:  Osteoarthritis of left shoulder: MRI was revealing for degenerative changes within the joint and effusion within the biceps tendon sheath. -Counseled on home exercise therapy and supportive care. -Formal physical therapy.  Rotator cuff tear of left shoulder: MRI was revealing for a small supraspinatus tear.  No retraction was appreciated. -counseled on home exercise therapy and supportive care. -nitro patches. -Referral to physical therapy. -Could consider injection or shockwave therapy  Follow Up Instructions:    I discussed the assessment and treatment plan with the patient. The patient was provided an opportunity to ask questions and all were answered. The patient agreed with the plan and demonstrated an understanding of the instructions.   The patient was advised to call back or seek an in-person evaluation if the symptoms worsen or if the condition fails to improve as anticipated.   Clearance Coots, MD

## 2023-01-20 NOTE — Therapy (Signed)
OUTPATIENT PHYSICAL THERAPY SHOULDER EVALUATION   Patient Name: Meredith York MRN: [id number] DOB:29-Aug-1976, 47 y.o., female Today's Date: 01/21/2023  END OF SESSION:  PT End of Session - 01/21/23 0926     Visit Number 1    Date for PT Re-Evaluation 04/08/23    PT Start Time 0928    PT Stop Time 8250    PT Time Calculation (min) 47 min    Activity Tolerance Patient limited by pain;Patient tolerated treatment well    Behavior During Therapy [facility] for tasks assessed/performed             Past Medical History:  Diagnosis Date   Carpal tunnel syndrome on right    Chlamydia    Gestational diabetes mellitus    Hypertension    no medicaqtions at this time   Migraines    Ovarian cyst    S/P lumbar discectomy 04/10/2015   Tendonitis    right wrist   Trichomonas    Type 2 diabetes mellitus ([place])    not currently taking medication   Past Surgical History:  Procedure Laterality Date   ABDOMINAL HYSTERECTOMY     BACK SURGERY     CESAREAN SECTION  12/27/2011   Procedure: CESAREAN SECTION;  Surgeon: Shelly Bombard, MD;  Location: [facility];  Service: Gynecology;  Laterality: N/A;  Primary cesarean section with delivery of baby girl at 22. Apgars 3/3/6   CESAREAN SECTION N/A 08/03/2016   Procedure: CESAREAN SECTION;  Surgeon: Osborne Oman, MD;  Location: [place];  Service: Obstetrics;  Laterality: N/A;   CHOLECYSTECTOMY  02/2009   DILATION AND CURETTAGE OF UTERUS N/A 06/25/2014   Procedure: DILATATION AND CURETTAGE;  Surgeon: Donnamae Jude, MD;  Location: [facility];  Service: Gynecology;  Laterality: N/A;   LUMBAR LAMINECTOMY/DECOMPRESSION MICRODISCECTOMY Left 04/10/2015   Procedure: Left L5-S1 Microdiscectomy;  Surgeon: Marybelle Killings, MD;  Location: [place];  Service: Orthopedics;  Laterality: Left;   VAGINAL HYSTERECTOMY N/A 06/04/2020   Procedure: HYSTERECTOMY VAGINAL, MORCELLATION;  Surgeon: Chancy Milroy, MD;  Location: [facility]  Service: Gynecology;   Laterality: N/A;   WISDOM TOOTH EXTRACTION     Patient Active Problem List   Diagnosis Date Noted   Rotator cuff tear, left 01/18/2023   OA (osteoarthritis) of shoulder 12/03/2022   Peroneal tendon tear, left, subsequent encounter 01/17/2021   Recurrent herniation of lumbar disc 10/30/2020   Prediabetes 09/24/2020   Post-operative state 06/04/2020   Visit for routine gyn exam 11/29/2019   Encounter for screening mammogram for breast cancer 11/29/2019   History of bilateral tubal ligation 11/29/2019   S/P cesarean section 07/14/2016   Hypertension 07/05/2011    PCP: Minette Brine  REFERRING PROVIDER: Clearance Coots  REFERRING DIAG: I37.048, M19.012  THERAPY DIAG:  Chronic left shoulder pain  Stiffness of left shoulder, not elsewhere classified  Muscle weakness (generalized)  Nontraumatic incomplete tear of left rotator cuff  Rationale for Evaluation and Treatment: Rehabilitation  ONSET DATE: "a few years"  SUBJECTIVE:  SUBJECTIVE STATEMENT: My shoulder has been bothering me for the past few years but the pain progressively got worse so I decided to get it checked out. I have had injections the last was a month ago and now I am wearing a nitroglycerin patch.   PERTINENT HISTORY: DM type 2, has gotten steroid injections in shoulder  PAIN:  Are you having pain? Yes: NPRS scale: 8/10 Pain location: L shoulder sometimes down into elbow and by my armpit Pain description: sharp, throbbing, burning Aggravating factors: moving, driving Relieving factors: ice packs  PRECAUTIONS: None  WEIGHT BEARING RESTRICTIONS: No  FALLS:  Has patient fallen in last 6 months? No  LIVING ENVIRONMENT: Lives with: lives with their family Lives in: House/apartment Stairs: Yes: Internal: 14 steps; on right  going up Has following equipment at home: None  OCCUPATION: Janitorial work  PLOF: Independent  PATIENT GOALS:getting the pain to go away and getting better at moving it  NEXT MD VISIT:   OBJECTIVE:   DIAGNOSTIC FINDINGS:  IMPRESSION: 1. Partial-thickness midsubstance tear of the mid to anterior supraspinatus tendon footprint measuring up to 10 mm in AP dimension. 2. Moderate degenerative changes of the acromioclavicular joint. High-grade marrow edema within the clavicular head suggests this may represent a source of pain. 3. Mild glenohumeral cartilage degenerative changes  PATIENT SURVEYS:  FOTO 38  COGNITION: Overall cognitive status: Within functional limits for tasks assessed     SENSATION: WFL  POSTURE:  Rounded shoulders  UPPER EXTREMITY ROM:   Active ROM Right eval Left eval  Shoulder flexion WFL 92 w/pain  Shoulder extension    Shoulder abduction WFL 80 w/pain  Shoulder adduction    Shoulder internal rotation  40 w/pain  Shoulder external rotation  35 w/pain  Elbow flexion    Elbow extension    Wrist flexion    Wrist extension    Wrist ulnar deviation    Wrist radial deviation    Wrist pronation    Wrist supination    (Blank rows = not tested)  UPPER EXTREMITY MMT:  MMT Right eval Left eval  Shoulder flexion WNL 2-  Shoulder extension    Shoulder abduction WNL 2-  Shoulder adduction    Shoulder internal rotation WNL 2-  Shoulder external rotation WNL 2-  Middle trapezius    Lower trapezius    Elbow flexion    Elbow extension    Wrist flexion    Wrist extension    Wrist ulnar deviation    Wrist radial deviation    Wrist pronation    Wrist supination    Grip strength (lbs)    (Blank rows = not tested)  SHOULDER SPECIAL TESTS: Impingement tests: Neer impingement test: positive , Hawkins/Kennedy impingement test: positive , and Painful arc test: positive   Rotator cuff assessment: Empty can test: positive  and Full can test:  positive    JOINT MOBILITY TESTING:  increased muscle guarding and pain limiting PROM  PALPATION:  TTP anterior shoulder with light pressure,    TODAY'S TREATMENT:  DATE: 01/21/23   PATIENT EDUCATION: Education details: POC and HEP Person educated: Patient Education method: Explanation Education comprehension: verbalized understanding  HOME EXERCISE PROGRAM: Access Code: [email or web address] URL: [email or web address] Date: 01/21/2023 Prepared by: Andris Baumann  Exercises - Supine Shoulder Flexion Extension AAROM with Dowel  - 1 x daily - 7 x weekly - 2 sets - 10 reps - Standing Shoulder Abduction AAROM with Dowel  - 1 x daily - 7 x weekly - 2 sets - 10 reps - Standing Shoulder Row with Anchored Resistance  - 1 x daily - 7 x weekly - 2 sets - 10 reps - Shoulder extension with resistance - Neutral  - 1 x daily - 7 x weekly - 2 sets - 10 reps - Shoulder External Rotation with Anchored Resistance  - 1 x daily - 7 x weekly - 2 sets - 10 reps - Shoulder Internal Rotation with Resistance  - 1 x daily - 7 x weekly - 2 sets - 10 reps  ASSESSMENT:  CLINICAL IMPRESSION: Patient is a 47 y.o. female who was seen today for physical therapy evaluation and treatment for L shoulder pain. She states her pain has been ongoing for years, initially was thought to be frozen shoulder but her recent MRI showed a tear in her supraspinatus. She presents with sign and symptoms consistent with rotator cuff pathology and is very limited in ROM due to lots of pain and weakness in the L shoulder. 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 She is using patches and ice to help with her pain. She will benefit from skilled PT to address her L shoulder impairments to be able to complete ADLs, work tasks, and return to PLOF without pain.    OBJECTIVE IMPAIRMENTS: decreased ROM, decreased strength, impaired UE functional use, and pain.   ACTIVITY LIMITATIONS: carrying, lifting, and reach over head  REHAB POTENTIAL: Good  CLINICAL DECISION MAKING: Stable/uncomplicated  EVALUATION COMPLEXITY: Low  GOALS: Goals reviewed with patient? Yes  SHORT TERM GOALS: Target date: 02/25/23  Patient will be independent with initial HEP.  Goal status: INITIAL   LONG TERM GOALS: Target date: 04/08/23  Patient will be independent with advanced/ongoing HEP to improve outcomes and carryover.  Goal status: INITIAL  2.  Patient will report 75% improvement in L shoulder pain to improve QOL.  Baseline: 8/10 Goal status: INITIAL  3.  Patient to improve L shoulder AROM to [facility] without pain provocation to allow for increased ease of ADLs.  Goal status: INITIAL  4.  Patient will demonstrate improved functional UE strength as demonstrated by 4/5 or better. Baseline: 2-/5 Goal status: INITIAL  5.  Patient will report 72 on FOTO to demonstrate improved functional ability.  Baseline: 38 Goal status: INITIAL  PLAN:  PT FREQUENCY: 2x/week  PT DURATION: 10 weeks  PLANNED INTERVENTIONS: Therapeutic exercises, Therapeutic activity, Neuromuscular re-education, Balance training, Gait training, Patient/Family education, Self Care, Joint mobilization, Dry Needling, Electrical stimulation, Cryotherapy, Moist heat, Ultrasound, Ionotophoresis '4mg'$ /ml Dexamethasone, and Manual therapy  PLAN FOR NEXT SESSION: AAROM, PROM, light strengthening, ice   [employer], PT 01/21/2023, 10:15 AM

## 2023-01-21 ENCOUNTER — Ambulatory Visit: Payer: 59 | Attending: Family Medicine

## 2023-01-21 DIAGNOSIS — M25612 Stiffness of left shoulder, not elsewhere classified: Secondary | ICD-10-CM | POA: Insufficient documentation

## 2023-01-21 DIAGNOSIS — M75112 Incomplete rotator cuff tear or rupture of left shoulder, not specified as traumatic: Secondary | ICD-10-CM | POA: Diagnosis present

## 2023-01-21 DIAGNOSIS — G8929 Other chronic pain: Secondary | ICD-10-CM | POA: Diagnosis present

## 2023-01-21 DIAGNOSIS — M6281 Muscle weakness (generalized): Secondary | ICD-10-CM | POA: Diagnosis present

## 2023-01-21 DIAGNOSIS — M19012 Primary osteoarthritis, left shoulder: Secondary | ICD-10-CM | POA: Insufficient documentation

## 2023-01-21 DIAGNOSIS — M25512 Pain in left shoulder: Secondary | ICD-10-CM | POA: Diagnosis present

## 2023-02-01 ENCOUNTER — Other Ambulatory Visit: Payer: Self-pay

## 2023-02-01 ENCOUNTER — Ambulatory Visit: Payer: 59

## 2023-02-01 DIAGNOSIS — M25612 Stiffness of left shoulder, not elsewhere classified: Secondary | ICD-10-CM

## 2023-02-01 DIAGNOSIS — M6281 Muscle weakness (generalized): Secondary | ICD-10-CM

## 2023-02-01 DIAGNOSIS — M75112 Incomplete rotator cuff tear or rupture of left shoulder, not specified as traumatic: Secondary | ICD-10-CM

## 2023-02-01 DIAGNOSIS — G8929 Other chronic pain: Secondary | ICD-10-CM

## 2023-02-01 DIAGNOSIS — M25512 Pain in left shoulder: Secondary | ICD-10-CM | POA: Diagnosis not present

## 2023-02-01 NOTE — Therapy (Signed)
OUTPATIENT PHYSICAL THERAPY SHOULDER TREATMENT   Patient Name: Meredith York MRN: [id number] DOB:[birth year], 47 y.o., female Today's Date: 02/01/2023  END OF SESSION:  PT End of Session - 02/01/23 1719     Visit Number 2    Date for PT Re-Evaluation 04/08/23    PT Start Time [id number]    PT Stop Time 1430    PT Time Calculation (min) 45 min    Activity Tolerance Patient limited by pain;Patient tolerated treatment well    Behavior During Therapy [facility] for tasks assessed/performed              Past Medical History:  Diagnosis Date   Carpal tunnel syndrome on right    Chlamydia    Gestational diabetes mellitus    Hypertension    no medicaqtions at this time   Migraines    Ovarian cyst    S/P lumbar discectomy 04/10/2015   Tendonitis    right wrist   Trichomonas    Type 2 diabetes mellitus ([place])    not currently taking medication   Past Surgical History:  Procedure Laterality Date   ABDOMINAL HYSTERECTOMY     BACK SURGERY     CESAREAN SECTION  12/27/2011   Procedure: CESAREAN SECTION;  Surgeon: Shelly Bombard, MD;  Location: [facility] [facility];  Service: Gynecology;  Laterality: N/A;  Primary cesarean section with delivery of baby girl at 26. Apgars 3/3/6   CESAREAN SECTION N/A 08/03/2016   Procedure: CESAREAN SECTION;  Surgeon: Osborne Oman, MD;  Location: [place];  Service: Obstetrics;  Laterality: N/A;   CHOLECYSTECTOMY  02/2009   DILATION AND CURETTAGE OF UTERUS N/A 06/25/2014   Procedure: DILATATION AND CURETTAGE;  Surgeon: Donnamae Jude, MD;  Location: [facility];  Service: Gynecology;  Laterality: N/A;   LUMBAR LAMINECTOMY/DECOMPRESSION MICRODISCECTOMY Left 04/10/2015   Procedure: Left L5-S1 Microdiscectomy;  Surgeon: Marybelle Killings, MD;  Location: [place];  Service: Orthopedics;  Laterality: Left;   VAGINAL HYSTERECTOMY N/A 06/04/2020   Procedure: HYSTERECTOMY VAGINAL, MORCELLATION;  Surgeon: Chancy Milroy, MD;  Location: [facility];  Service: Gynecology;   Laterality: N/A;   WISDOM TOOTH EXTRACTION     Patient Active Problem List   Diagnosis Date Noted   Rotator cuff tear, left 01/18/2023   OA (osteoarthritis) of shoulder 12/03/2022   Peroneal tendon tear, left, subsequent encounter 01/17/2021   Recurrent herniation of lumbar disc 10/30/2020   Prediabetes 09/24/2020   Post-operative state 06/04/2020   Visit for routine gyn exam 11/29/2019   Encounter for screening mammogram for breast cancer 11/29/2019   History of bilateral tubal ligation 11/29/2019   S/P cesarean section 07/14/2016   Hypertension 07/05/2011    PCP: Minette Brine  REFERRING PROVIDER: Clearance Coots  REFERRING DIAG: [id number], M19.012  THERAPY DIAG:  Chronic left shoulder pain  Stiffness of left shoulder, not elsewhere classified  Muscle weakness (generalized)  Nontraumatic incomplete tear of left rotator cuff  Rationale for Evaluation and Treatment: Rehabilitation  ONSET DATE: "a few years"  SUBJECTIVE:  SUBJECTIVE STATEMENT: My shoulder has been bothering me for the past few years but the pain progressively got worse so I decided to get it checked out. I have had injections the last was a month ago and now I am wearing a nitroglycerin patch.   PERTINENT HISTORY: DM type 2, has gotten steroid injections in shoulder  PAIN:  Are you having pain? Yes: NPRS scale: 8/10 Pain location: L shoulder sometimes down into elbow and by my armpit Pain description: sharp, throbbing, burning Aggravating factors: moving, driving Relieving factors: ice packs  PRECAUTIONS: None  WEIGHT BEARING RESTRICTIONS: No  FALLS:  Has patient fallen in last 6 months? No  LIVING ENVIRONMENT: Lives with: lives with their family Lives in: House/apartment Stairs: Yes: Internal: 14 steps; on right  going up Has following equipment at home: None  OCCUPATION: Janitorial work  PLOF: Independent  PATIENT GOALS:getting the pain to go away and getting better at moving it  NEXT MD VISIT:   OBJECTIVE:   DIAGNOSTIC FINDINGS:  IMPRESSION: 1. Partial-thickness midsubstance tear of the mid to anterior supraspinatus tendon footprint measuring up to 10 mm in AP dimension. 2. Moderate degenerative changes of the acromioclavicular joint. High-grade marrow edema within the clavicular head suggests this may represent a source of pain. 3. Mild glenohumeral cartilage degenerative changes  PATIENT SURVEYS:  FOTO 38  COGNITION: Overall cognitive status: Within functional limits for tasks assessed     SENSATION: WFL  POSTURE:  Rounded shoulders  UPPER EXTREMITY ROM:   Active ROM Right eval Left eval  Shoulder flexion WFL 92 w/pain  Shoulder extension    Shoulder abduction WFL 80 w/pain  Shoulder adduction    Shoulder internal rotation  40 w/pain  Shoulder external rotation  35 w/pain  Elbow flexion    Elbow extension    Wrist flexion    Wrist extension    Wrist ulnar deviation    Wrist radial deviation    Wrist pronation    Wrist supination    (Blank rows = not tested)  UPPER EXTREMITY MMT:  MMT Right eval Left eval  Shoulder flexion WNL 2-  Shoulder extension    Shoulder abduction WNL 2-  Shoulder adduction    Shoulder internal rotation WNL 2-  Shoulder external rotation WNL 2-  Middle trapezius    Lower trapezius    Elbow flexion    Elbow extension    Wrist flexion    Wrist extension    Wrist ulnar deviation    Wrist radial deviation    Wrist pronation    Wrist supination    Grip strength (lbs)    (Blank rows = not tested)  SHOULDER SPECIAL TESTS: Impingement tests: Neer impingement test: positive , Hawkins/Kennedy impingement test: positive , and Painful arc test: positive   Rotator cuff assessment: Empty can test: positive  and Full can test:  positive    JOINT MOBILITY TESTING:  increased muscle guarding and pain limiting PROM  PALPATION:  TTP anterior shoulder with light pressure,    TODAY'S TREATMENT:  DATE: 01/21/23 Manual:  Supine for gentle distraction/ compression of humerus, at 45 degrees scaption position, also A/P glides L humeral head , gr 2, gentle AAROM L shoulder all planes, 10 reps, to improve jt mobility R sidelying with L hand on table in supported horizontal adduction position, for scapular glides, sup/inf, ant/post, and diagonals  Kinesiotaping 2 I pieces anchored anterior deltoid attachments, ant and med , extending to mid scapular upper and middle traps, with 35% pull.    Therex:  reviewed current home program, and adapted, added : -pendulum L shoulder, with 2# for gh jt distraction and mobility -seated isometric L shoulder ER , 30 to 45 sec holds for pain management, cognitive pain inibition, advised to perform 3 to 5 reps  Advised to continue with : -theraband extension B shoulders, emphasis on scapular movement -theraband rows -standing L shouder abd with wand  Advised to hold theraband Er and IR currently due to level of irritability L shoulder Added moist heat L shoulder for 5 min post Rx for trial as patient may use at home  PATIENT EDUCATION: Education details: POC and HEP Person educated: Patient Education method: Explanation Education comprehension: verbalized understanding  HOME EXERCISE PROGRAM: Access Code: [email or web address] URL: [email or web address] Date: 01/21/2023 Prepared by: Andris Baumann  Exercises - Supine Shoulder Flexion Extension AAROM with Dowel  - 1 x daily - 7 x weekly - 2 sets - 10 reps - Standing Shoulder Abduction AAROM with Dowel  - 1 x daily - 7 x weekly - 2 sets - 10 reps - Standing Shoulder Row with Anchored Resistance  - 1 x  daily - 7 x weekly - 2 sets - 10 reps - Shoulder extension with resistance - Neutral  - 1 x daily - 7 x weekly - 2 sets - 10 reps - Shoulder External Rotation with Anchored Resistance  - 1 x daily - 7 x weekly - 2 sets - 10 reps - Shoulder Internal Rotation with Resistance  - 1 x daily - 7 x weekly - 2 sets - 10 reps  ASSESSMENT:  CLINICAL IMPRESSION: Patient is a 47 y.o. female who was seen today for physical therapy treatment for L shoulder pain. She continues to demonstrate a high level of resting pain at 7-8.  Added more pain management techniques today.  She will continue to benefit from skilled PT to address her L shoulder impairments to be able to complete ADLs, work tasks, and return to PLOF without pain.   OBJECTIVE IMPAIRMENTS: decreased ROM, decreased strength, impaired UE functional use, and pain.   ACTIVITY LIMITATIONS: carrying, lifting, and reach over head  REHAB POTENTIAL: Good  CLINICAL DECISION MAKING: Stable/uncomplicated  EVALUATION COMPLEXITY: Low  GOALS: Goals reviewed with patient? Yes  SHORT TERM GOALS: Target date: 02/25/23  Patient will be independent with initial HEP.  Goal status: INITIAL   LONG TERM GOALS: Target date: 04/08/23  Patient will be independent with advanced/ongoing HEP to improve outcomes and carryover.  Goal status: INITIAL  2.  Patient will report 75% improvement in L shoulder pain to improve QOL.  Baseline: 8/10 Goal status: INITIAL  3.  Patient to improve L shoulder AROM to [facility] without pain provocation to allow for increased ease of ADLs.  Goal status: INITIAL  4.  Patient will demonstrate improved functional UE strength as demonstrated by 4/5 or better. Baseline: 2-/5 Goal status: INITIAL  5.  Patient will report 4 on FOTO to demonstrate improved functional ability.  Baseline: 38 Goal status: INITIAL  PLAN:  PT FREQUENCY: 2x/week  PT DURATION: 10 weeks  PLANNED INTERVENTIONS: Therapeutic exercises, Therapeutic  activity, Neuromuscular re-education, Balance training, Gait training, Patient/Family education, Self Care, Joint mobilization, Dry Needling, Electrical stimulation, Cryotherapy, Moist heat, Ultrasound, Ionotophoresis 74m/ml Dexamethasone, and Manual therapy  PLAN FOR NEXT SESSION: AAROM, PROM, light strengthening, ice/heat   Nicholson Starace L Jayline [place], PT 02/01/2023, 5:22 PM

## 2023-02-02 NOTE — Therapy (Signed)
OUTPATIENT PHYSICAL THERAPY SHOULDER TREATMENT   Patient Name: Meredith York MRN: [id number] DOB:[birth year], 47 y.o., female Today's Date: 02/03/2023  END OF SESSION:  PT End of Session - 02/03/23 1357     Visit Number 3    Date for PT Re-Evaluation 04/08/23    PT Start Time 1400    Activity Tolerance Patient limited by pain;Patient tolerated treatment well    Behavior During Therapy [facility] for tasks assessed/performed               Past Medical History:  Diagnosis Date   Carpal tunnel syndrome on right    Chlamydia    Gestational diabetes mellitus    Hypertension    no medicaqtions at this time   Migraines    Ovarian cyst    S/P lumbar discectomy 04/10/2015   Tendonitis    right wrist   Trichomonas    Type 2 diabetes mellitus ([place])    not currently taking medication   Past Surgical History:  Procedure Laterality Date   ABDOMINAL HYSTERECTOMY     BACK SURGERY     CESAREAN SECTION  12/27/2011   Procedure: CESAREAN SECTION;  Surgeon: Shelly Bombard, MD;  Location: [facility];  Service: Gynecology;  Laterality: N/A;  Primary cesarean section with delivery of baby girl at 23. Apgars 3/3/6   CESAREAN SECTION N/A 08/03/2016   Procedure: CESAREAN SECTION;  Surgeon: Osborne Oman, MD;  Location: [place];  Service: Obstetrics;  Laterality: N/A;   CHOLECYSTECTOMY  02/2009   DILATION AND CURETTAGE OF UTERUS N/A 06/25/2014   Procedure: DILATATION AND CURETTAGE;  Surgeon: Donnamae Jude, MD;  Location: [facility];  Service: Gynecology;  Laterality: N/A;   LUMBAR LAMINECTOMY/DECOMPRESSION MICRODISCECTOMY Left 04/10/2015   Procedure: Left L5-S1 Microdiscectomy;  Surgeon: Marybelle Killings, MD;  Location: [place];  Service: Orthopedics;  Laterality: Left;   VAGINAL HYSTERECTOMY N/A 06/04/2020   Procedure: HYSTERECTOMY VAGINAL, MORCELLATION;  Surgeon: Chancy Milroy, MD;  Location: [facility];  Service: Gynecology;  Laterality: N/A;   WISDOM TOOTH EXTRACTION      Patient Active Problem List   Diagnosis Date Noted   Rotator cuff tear, left 01/18/2023   OA (osteoarthritis) of shoulder 12/03/2022   Peroneal tendon tear, left, subsequent encounter 01/17/2021   Recurrent herniation of lumbar disc 10/30/2020   Prediabetes 09/24/2020   Post-operative state 06/04/2020   Visit for routine gyn exam 11/29/2019   Encounter for screening mammogram for breast cancer 11/29/2019   History of bilateral tubal ligation 11/29/2019   S/P cesarean section 07/14/2016   Hypertension 07/05/2011    PCP: Minette Brine  REFERRING PROVIDER: Clearance Coots  REFERRING DIAG: [id number], M19.012  THERAPY DIAG:  Chronic left shoulder pain  Stiffness of left shoulder, not elsewhere classified  Muscle weakness (generalized)  Nontraumatic incomplete tear of left rotator cuff  Rationale for Evaluation and Treatment: Rehabilitation  ONSET DATE: "a few years"  SUBJECTIVE:  SUBJECTIVE STATEMENT: I just got off work so my arm is hurting today.   PERTINENT HISTORY: DM type 2, has gotten steroid injections in shoulder  PAIN:  Are you having pain? Yes: NPRS scale: 7/10 Pain location: L shoulder sometimes down into elbow and by my armpit Pain description: sharp, throbbing, burning Aggravating factors: moving, driving Relieving factors: ice packs  PRECAUTIONS: None  WEIGHT BEARING RESTRICTIONS: No  FALLS:  Has patient fallen in last 6 months? No  LIVING ENVIRONMENT: Lives with: lives with their family Lives in: House/apartment Stairs: Yes: Internal: 14 steps; on right going up Has following equipment at home: None  OCCUPATION: Janitorial work  PLOF: Independent  PATIENT GOALS:getting the pain to go away and getting better at moving it  NEXT MD VISIT:   OBJECTIVE:    DIAGNOSTIC FINDINGS:  IMPRESSION: 1. Partial-thickness midsubstance tear of the mid to anterior supraspinatus tendon footprint measuring up to 10 mm in AP dimension. 2. Moderate degenerative changes of the acromioclavicular joint. High-grade marrow edema within the clavicular head suggests this may represent a source of pain. 3. Mild glenohumeral cartilage degenerative changes  PATIENT SURVEYS:  FOTO 38  COGNITION: Overall cognitive status: Within functional limits for tasks assessed     SENSATION: WFL  POSTURE:  Rounded shoulders  UPPER EXTREMITY ROM:   Active ROM Right eval Left eval  Shoulder flexion WFL 92 w/pain  Shoulder extension    Shoulder abduction WFL 80 w/pain  Shoulder adduction    Shoulder internal rotation  40 w/pain  Shoulder external rotation  35 w/pain  Elbow flexion    Elbow extension    Wrist flexion    Wrist extension    Wrist ulnar deviation    Wrist radial deviation    Wrist pronation    Wrist supination    (Blank rows = not tested)  UPPER EXTREMITY MMT:  MMT Right eval Left eval  Shoulder flexion WNL 2-  Shoulder extension    Shoulder abduction WNL 2-  Shoulder adduction    Shoulder internal rotation WNL 2-  Shoulder external rotation WNL 2-  Middle trapezius    Lower trapezius    Elbow flexion    Elbow extension    Wrist flexion    Wrist extension    Wrist ulnar deviation    Wrist radial deviation    Wrist pronation    Wrist supination    Grip strength (lbs)    (Blank rows = not tested)  SHOULDER SPECIAL TESTS: Impingement tests: Neer impingement test: positive , Hawkins/Kennedy impingement test: positive , and Painful arc test: positive   Rotator cuff assessment: Empty can test: positive  and Full can test: positive    JOINT MOBILITY TESTING:  increased muscle guarding and pain limiting PROM  PALPATION:  TTP anterior shoulder with light pressure,    TODAY'S TREATMENT:  DATE:  02/03/23 UBE L1 x3 mins forwards, 1x back stopped due to pain  PROM all directions 5s holds at end range Lateral distraction to L shoulder- pt states this feels good  A/P grade 2 mobs and inferior glides for pain modulation Supine AAROM flexion with dowel 2x10 Sidelying ER 2# 2x10 Wall slides flexion and abd x10  Standing rows red 2x10 Ice to R shoulder    01/21/23 Manual:  Supine for gentle distraction/ compression of humerus, at 45 degrees scaption position, also A/P glides L humeral head , gr 2, gentle AAROM L shoulder all planes, 10 reps, to improve jt mobility R sidelying with L hand on table in supported horizontal adduction position, for scapular glides, sup/inf, ant/post, and diagonals  Kinesiotaping 2 I pieces anchored anterior deltoid attachments, ant and med , extending to mid scapular upper and middle traps, with 35% pull.    Therex:  reviewed current home program, and adapted, added : -pendulum L shoulder, with 2# for gh jt distraction and mobility -seated isometric L shoulder ER , 30 to 45 sec holds for pain management, cognitive pain inibition, advised to perform 3 to 5 reps  Advised to continue with : -theraband extension B shoulders, emphasis on scapular movement -theraband rows -standing L shouder abd with wand  Advised to hold theraband Er and IR currently due to level of irritability L shoulder Added moist heat L shoulder for 5 min post Rx for trial as patient may use at home   PATIENT EDUCATION: Education details: POC and HEP Person educated: Patient Education method: Explanation Education comprehension: verbalized understanding  HOME EXERCISE PROGRAM: Access Code: [email or web address] URL: [email or web address] Date: 01/21/2023 Prepared by: Andris Baumann  Exercises - Supine Shoulder Flexion Extension AAROM with Dowel  - 1 x daily - 7 x weekly - 2 sets -  10 reps - Standing Shoulder Abduction AAROM with Dowel  - 1 x daily - 7 x weekly - 2 sets - 10 reps - Standing Shoulder Row with Anchored Resistance  - 1 x daily - 7 x weekly - 2 sets - 10 reps - Shoulder extension with resistance - Neutral  - 1 x daily - 7 x weekly - 2 sets - 10 reps - Shoulder External Rotation with Anchored Resistance  - 1 x daily - 7 x weekly - 2 sets - 10 reps - Shoulder Internal Rotation with Resistance  - 1 x daily - 7 x weekly - 2 sets - 10 reps  ASSESSMENT:  CLINICAL IMPRESSION: Patient continues to demonstrate a high level of resting pain at 7.  We did some more strengthening today. She is still very guarded with PROM, however is able to get into further ranges once she relaxes. Cold pack at end of session to help with pain and any possible post exercise soreness.   OBJECTIVE IMPAIRMENTS: decreased ROM, decreased strength, impaired UE functional use, and pain.   ACTIVITY LIMITATIONS: carrying, lifting, and reach over head  REHAB POTENTIAL: Good  CLINICAL DECISION MAKING: Stable/uncomplicated  EVALUATION COMPLEXITY: Low  GOALS: Goals reviewed with patient? Yes  SHORT TERM GOALS: Target date: 02/25/23  Patient will be independent with initial HEP.  Goal status: INITIAL   LONG TERM GOALS: Target date: 04/08/23  Patient will be independent with advanced/ongoing HEP to improve outcomes and carryover.  Goal status: INITIAL  2.  Patient will report 75% improvement in L shoulder pain to improve QOL.  Baseline: 8/10 Goal status: INITIAL  3.  Patient to improve L shoulder AROM  to [facility] without pain provocation to allow for increased ease of ADLs.  Goal status: INITIAL  4.  Patient will demonstrate improved functional UE strength as demonstrated by 4/5 or better. Baseline: 2-/5 Goal status: INITIAL  5.  Patient will report 68 on FOTO to demonstrate improved functional ability.  Baseline: 38 Goal status: INITIAL  PLAN:  PT FREQUENCY: 2x/week  PT  DURATION: 10 weeks  PLANNED INTERVENTIONS: Therapeutic exercises, Therapeutic activity, Neuromuscular re-education, Balance training, Gait training, Patient/Family education, Self Care, Joint mobilization, Dry Needling, Electrical stimulation, Cryotherapy, Moist heat, Ultrasound, Ionotophoresis 78m/ml Dexamethasone, and Manual therapy  PLAN FOR NEXT SESSION: AAROM, PROM, light strengthening, ice/heat   MAndris Baumann PT 02/03/2023, 2:44 PM

## 2023-02-03 ENCOUNTER — Ambulatory Visit: Payer: 59

## 2023-02-03 DIAGNOSIS — M75112 Incomplete rotator cuff tear or rupture of left shoulder, not specified as traumatic: Secondary | ICD-10-CM

## 2023-02-03 DIAGNOSIS — M6281 Muscle weakness (generalized): Secondary | ICD-10-CM

## 2023-02-03 DIAGNOSIS — M25612 Stiffness of left shoulder, not elsewhere classified: Secondary | ICD-10-CM

## 2023-02-03 DIAGNOSIS — M25512 Pain in left shoulder: Secondary | ICD-10-CM | POA: Diagnosis not present

## 2023-02-03 DIAGNOSIS — G8929 Other chronic pain: Secondary | ICD-10-CM

## 2023-02-04 ENCOUNTER — Telehealth: Payer: Self-pay | Admitting: *Deleted

## 2023-02-04 NOTE — Telephone Encounter (Signed)
-----   Message from Elberta Spaniel sent at 02/04/2023  9:57 AM EST ----- Regarding: Pt request work restriction note Pt called request a work note w/ restrictions. 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   Please contact pt if any questions @  [phone] (M)   Thxs

## 2023-02-04 NOTE — Telephone Encounter (Signed)
Letter provided/upfront for p/u. Patient is aware.

## 2023-02-08 ENCOUNTER — Ambulatory Visit: Payer: 59

## 2023-02-08 DIAGNOSIS — M25612 Stiffness of left shoulder, not elsewhere classified: Secondary | ICD-10-CM

## 2023-02-08 DIAGNOSIS — M6281 Muscle weakness (generalized): Secondary | ICD-10-CM

## 2023-02-08 DIAGNOSIS — M75112 Incomplete rotator cuff tear or rupture of left shoulder, not specified as traumatic: Secondary | ICD-10-CM

## 2023-02-08 DIAGNOSIS — M25512 Pain in left shoulder: Secondary | ICD-10-CM | POA: Diagnosis not present

## 2023-02-08 DIAGNOSIS — G8929 Other chronic pain: Secondary | ICD-10-CM

## 2023-02-08 NOTE — Therapy (Signed)
OUTPATIENT PHYSICAL THERAPY SHOULDER TREATMENT   Patient Name: Meredith York MRN: [id number] DOB:1976-06-06, 47 y.o., female Today's Date: 02/08/2023  END OF SESSION:  PT End of Session - 02/08/23 1059     Visit Number 4    Date for PT Re-Evaluation 04/08/23    PT Start Time 1100    PT Stop Time 1145    PT Time Calculation (min) 45 min    Activity Tolerance Patient limited by pain;Patient tolerated treatment well    Behavior During Therapy [facility] for tasks assessed/performed               Past Medical History:  Diagnosis Date   Carpal tunnel syndrome on right    Chlamydia    Gestational diabetes mellitus    Hypertension    no medicaqtions at this time   Migraines    Ovarian cyst    S/P lumbar discectomy 04/10/2015   Tendonitis    right wrist   Trichomonas    Type 2 diabetes mellitus ([place])    not currently taking medication   Past Surgical History:  Procedure Laterality Date   ABDOMINAL HYSTERECTOMY     BACK SURGERY     CESAREAN SECTION  12/27/2011   Procedure: CESAREAN SECTION;  Surgeon: Shelly Bombard, MD;  Location: [place] [facility];  Service: Gynecology;  Laterality: N/A;  Primary cesarean section with delivery of baby girl at 84. Apgars 3/3/6   CESAREAN SECTION N/A 08/03/2016   Procedure: CESAREAN SECTION;  Surgeon: Osborne Oman, MD;  Location: [place];  Service: Obstetrics;  Laterality: N/A;   CHOLECYSTECTOMY  02/2009   DILATION AND CURETTAGE OF UTERUS N/A 06/25/2014   Procedure: DILATATION AND CURETTAGE;  Surgeon: Donnamae Jude, MD;  Location: [facility];  Service: Gynecology;  Laterality: N/A;   LUMBAR LAMINECTOMY/DECOMPRESSION MICRODISCECTOMY Left 04/10/2015   Procedure: Left L5-S1 Microdiscectomy;  Surgeon: Marybelle Killings, MD;  Location: [place];  Service: Orthopedics;  Laterality: Left;   VAGINAL HYSTERECTOMY N/A 06/04/2020   Procedure: HYSTERECTOMY VAGINAL, MORCELLATION;  Surgeon: Chancy Milroy, MD;  Location: [facility];  Service:  Gynecology;  Laterality: N/A;   WISDOM TOOTH EXTRACTION     Patient Active Problem List   Diagnosis Date Noted   Rotator cuff tear, left 01/18/2023   OA (osteoarthritis) of shoulder 12/03/2022   Peroneal tendon tear, left, subsequent encounter 01/17/2021   Recurrent herniation of lumbar disc 10/30/2020   Prediabetes 09/24/2020   Post-operative state 06/04/2020   Visit for routine gyn exam 11/29/2019   Encounter for screening mammogram for breast cancer 11/29/2019   History of bilateral tubal ligation 11/29/2019   S/P cesarean section 07/14/2016   Hypertension 07/05/2011    PCP: Minette Brine  REFERRING PROVIDER: Clearance Coots  REFERRING DIAG: [id number], M19.012  THERAPY DIAG:  Chronic left shoulder pain  Stiffness of left shoulder, not elsewhere classified  Muscle weakness (generalized)  Nontraumatic incomplete tear of left rotator cuff  Rationale for Evaluation and Treatment: Rehabilitation  ONSET DATE: "a few years"  SUBJECTIVE:  SUBJECTIVE STATEMENT: I am sore  PERTINENT HISTORY: DM type 2, has gotten steroid injections in shoulder  PAIN:  Are you having pain? Yes: NPRS scale: 7/10 Pain location: L shoulder sometimes down into elbow and by my armpit Pain description: sharp, throbbing, burning Aggravating factors: moving, driving Relieving factors: ice packs  PRECAUTIONS: None  WEIGHT BEARING RESTRICTIONS: No  FALLS:  Has patient fallen in last 6 months? No  LIVING ENVIRONMENT: Lives with: lives with their family Lives in: House/apartment Stairs: Yes: Internal: 14 steps; on right going up Has following equipment at home: None  OCCUPATION: Janitorial work  PLOF: Independent  PATIENT GOALS:getting the pain to go away and getting better at moving it  NEXT MD VISIT:    OBJECTIVE:   DIAGNOSTIC FINDINGS:  IMPRESSION: 1. Partial-thickness midsubstance tear of the mid to anterior supraspinatus tendon footprint measuring up to 10 mm in AP dimension. 2. Moderate degenerative changes of the acromioclavicular joint. High-grade marrow edema within the clavicular head suggests this may represent a source of pain. 3. Mild glenohumeral cartilage degenerative changes  PATIENT SURVEYS:  FOTO 38  COGNITION: Overall cognitive status: Within functional limits for tasks assessed     SENSATION: WFL  POSTURE:  Rounded shoulders  UPPER EXTREMITY ROM:   Active ROM Right eval Left eval  Shoulder flexion WFL 92 w/pain  Shoulder extension    Shoulder abduction WFL 80 w/pain  Shoulder adduction    Shoulder internal rotation  40 w/pain  Shoulder external rotation  35 w/pain  Elbow flexion    Elbow extension    Wrist flexion    Wrist extension    Wrist ulnar deviation    Wrist radial deviation    Wrist pronation    Wrist supination    (Blank rows = not tested)  UPPER EXTREMITY MMT:  MMT Right eval Left eval  Shoulder flexion WNL 2-  Shoulder extension    Shoulder abduction WNL 2-  Shoulder adduction    Shoulder internal rotation WNL 2-  Shoulder external rotation WNL 2-  Middle trapezius    Lower trapezius    Elbow flexion    Elbow extension    Wrist flexion    Wrist extension    Wrist ulnar deviation    Wrist radial deviation    Wrist pronation    Wrist supination    Grip strength (lbs)    (Blank rows = not tested)  SHOULDER SPECIAL TESTS: Impingement tests: Neer impingement test: positive , Hawkins/Kennedy impingement test: positive , and Painful arc test: positive   Rotator cuff assessment: Empty can test: positive  and Full can test: positive    JOINT MOBILITY TESTING:  increased muscle guarding and pain limiting PROM  PALPATION:  TTP anterior shoulder with light pressure,    TODAY'S TREATMENT:  DATE:  02/08/23 Pulleys flexion and abd x10 Wall slides flexion and abd x10 ER/IR isometric against ball x10 Pendulums side to side, circles x10 PROM all directions  ER/IR supine with yellow band 2x10 Supine abduction and flexion AROM 2x10   02/03/23 UBE L1 x3 mins forwards, 1x back stopped due to pain  PROM all directions 5s holds at end range Lateral distraction to L shoulder- pt states this feels good  A/P grade 2 mobs and inferior glides for pain modulation Supine AAROM flexion with dowel 2x10 Sidelying ER 2# 2x10 Wall slides flexion and abd x10  Standing rows red 2x10 Ice to R shoulder    01/21/23 Manual:  Supine for gentle distraction/ compression of humerus, at 45 degrees scaption position, also A/P glides L humeral head , gr 2, gentle AAROM L shoulder all planes, 10 reps, to improve jt mobility R sidelying with L hand on table in supported horizontal adduction position, for scapular glides, sup/inf, ant/post, and diagonals  Kinesiotaping 2 I pieces anchored anterior deltoid attachments, ant and med , extending to mid scapular upper and middle traps, with 35% pull.    Therex:  reviewed current home program, and adapted, added : -pendulum L shoulder, with 2# for gh jt distraction and mobility -seated isometric L shoulder ER , 30 to 45 sec holds for pain management, cognitive pain inibition, advised to perform 3 to 5 reps  Advised to continue with : -theraband extension B shoulders, emphasis on scapular movement -theraband rows -standing L shouder abd with wand  Advised to hold theraband Er and IR currently due to level of irritability L shoulder Added moist heat L shoulder for 5 min post Rx for trial as patient may use at home   PATIENT EDUCATION: Education details: POC and HEP Person educated: Patient Education method: Explanation Education comprehension:  verbalized understanding  HOME EXERCISE PROGRAM: Access Code: [email or web address] URL: [email or web address] Date: 01/21/2023 Prepared by: Andris Baumann  Exercises - Supine Shoulder Flexion Extension AAROM with Dowel  - 1 x daily - 7 x weekly - 2 sets - 10 reps - Standing Shoulder Abduction AAROM with Dowel  - 1 x daily - 7 x weekly - 2 sets - 10 reps - Standing Shoulder Row with Anchored Resistance  - 1 x daily - 7 x weekly - 2 sets - 10 reps - Shoulder extension with resistance - Neutral  - 1 x daily - 7 x weekly - 2 sets - 10 reps - Shoulder External Rotation with Anchored Resistance  - 1 x daily - 7 x weekly - 2 sets - 10 reps - Shoulder Internal Rotation with Resistance  - 1 x daily - 7 x weekly - 2 sets - 10 reps  ASSESSMENT:  CLINICAL IMPRESSION: Patient continues to demonstrate a high level of resting pain at 7.  Increased ROM with wall slides today compared to last visit. She is still very guarded with PROM due to pain, especially into flexion and abduction only able to get up to 90d. However when she does it actively she is able to get over 90d. Worked on some light strengthening as tolerated    OBJECTIVE IMPAIRMENTS: decreased ROM, decreased strength, impaired UE functional use, and pain.   ACTIVITY LIMITATIONS: carrying, lifting, and reach over head  REHAB POTENTIAL: Good  CLINICAL DECISION MAKING: Stable/uncomplicated  EVALUATION COMPLEXITY: Low  GOALS: Goals reviewed with patient? Yes  SHORT TERM GOALS: Target date: 02/25/23  Patient will be independent with initial HEP.  Goal status: INITIAL  LONG TERM GOALS: Target date: 04/08/23  Patient will be independent with advanced/ongoing HEP to improve outcomes and carryover.  Goal status: INITIAL  2.  Patient will report 75% improvement in L shoulder pain to improve QOL.  Baseline: 8/10 Goal status: INITIAL  3.  Patient to improve L shoulder AROM to [facility] without pain provocation to allow for increased ease of  ADLs.  Goal status: INITIAL  4.  Patient will demonstrate improved functional UE strength as demonstrated by 4/5 or better. Baseline: 2-/5 Goal status: INITIAL  5.  Patient will report 55 on FOTO to demonstrate improved functional ability.  Baseline: 38 Goal status: INITIAL  PLAN:  PT FREQUENCY: 2x/week  PT DURATION: 10 weeks  PLANNED INTERVENTIONS: Therapeutic exercises, Therapeutic activity, Neuromuscular re-education, Balance training, Gait training, Patient/Family education, Self Care, Joint mobilization, Dry Needling, Electrical stimulation, Cryotherapy, Moist heat, Ultrasound, Ionotophoresis 32m/ml Dexamethasone, and Manual therapy  PLAN FOR NEXT SESSION: AAROM, PROM, light strengthening, ice/heat   MAndris Baumann PT 02/08/2023, 11:45 AM

## 2023-02-10 ENCOUNTER — Ambulatory Visit: Payer: 59

## 2023-02-10 ENCOUNTER — Other Ambulatory Visit: Payer: Self-pay

## 2023-02-10 DIAGNOSIS — M75112 Incomplete rotator cuff tear or rupture of left shoulder, not specified as traumatic: Secondary | ICD-10-CM

## 2023-02-10 DIAGNOSIS — M25512 Pain in left shoulder: Secondary | ICD-10-CM | POA: Diagnosis not present

## 2023-02-10 DIAGNOSIS — M25612 Stiffness of left shoulder, not elsewhere classified: Secondary | ICD-10-CM

## 2023-02-10 DIAGNOSIS — G8929 Other chronic pain: Secondary | ICD-10-CM

## 2023-02-10 DIAGNOSIS — M6281 Muscle weakness (generalized): Secondary | ICD-10-CM

## 2023-02-10 NOTE — Therapy (Signed)
OUTPATIENT PHYSICAL THERAPY SHOULDER TREATMENT   Patient Name: Meredith York MRN: [id number] DOB:[birth year], 47 y.o., female Today's Date: 02/10/2023  END OF SESSION:  PT End of Session - 02/10/23 1300     Visit Number 5    Date for PT Re-Evaluation 04/08/23    PT Start Time 1300    PT Stop Time 1345    PT Time Calculation (min) 45 min    Activity Tolerance Patient limited by pain;Patient tolerated treatment well    Behavior During Therapy [facility] for tasks assessed/performed                Past Medical History:  Diagnosis Date   Carpal tunnel syndrome on right    Chlamydia    Gestational diabetes mellitus    Hypertension    no medicaqtions at this time   Migraines    Ovarian cyst    S/P lumbar discectomy 04/10/2015   Tendonitis    right wrist   Trichomonas    Type 2 diabetes mellitus ([place])    not currently taking medication   Past Surgical History:  Procedure Laterality Date   ABDOMINAL HYSTERECTOMY     BACK SURGERY     CESAREAN SECTION  12/27/2011   Procedure: CESAREAN SECTION;  Surgeon: Shelly Bombard, MD;  Location: [place] [facility];  Service: Gynecology;  Laterality: N/A;  Primary cesarean section with delivery of baby girl at 69. Apgars 3/3/6   CESAREAN SECTION N/A 08/03/2016   Procedure: CESAREAN SECTION;  Surgeon: Osborne Oman, MD;  Location: [place];  Service: Obstetrics;  Laterality: N/A;   CHOLECYSTECTOMY  02/2009   DILATION AND CURETTAGE OF UTERUS N/A 06/25/2014   Procedure: DILATATION AND CURETTAGE;  Surgeon: Donnamae Jude, MD;  Location: [place] [facility];  Service: Gynecology;  Laterality: N/A;   LUMBAR LAMINECTOMY/DECOMPRESSION MICRODISCECTOMY Left 04/10/2015   Procedure: Left L5-S1 Microdiscectomy;  Surgeon: Marybelle Killings, MD;  Location: [place];  Service: Orthopedics;  Laterality: Left;   VAGINAL HYSTERECTOMY N/A 06/04/2020   Procedure: HYSTERECTOMY VAGINAL, MORCELLATION;  Surgeon: Chancy Milroy, MD;  Location: [facility];  Service:  Gynecology;  Laterality: N/A;   WISDOM TOOTH EXTRACTION     Patient Active Problem List   Diagnosis Date Noted   Rotator cuff tear, left 01/18/2023   OA (osteoarthritis) of shoulder 12/03/2022   Peroneal tendon tear, left, subsequent encounter 01/17/2021   Recurrent herniation of lumbar disc 10/30/2020   Prediabetes 09/24/2020   Post-operative state 06/04/2020   Visit for routine gyn exam 11/29/2019   Encounter for screening mammogram for breast cancer 11/29/2019   History of bilateral tubal ligation 11/29/2019   S/P cesarean section 07/14/2016   Hypertension 07/05/2011    PCP: Minette Brine  REFERRING PROVIDER: Clearance Coots  REFERRING DIAG: [id number], M19.012  THERAPY DIAG:  Chronic left shoulder pain  Stiffness of left shoulder, not elsewhere classified  Muscle weakness (generalized)  Nontraumatic incomplete tear of left rotator cuff  Rationale for Evaluation and Treatment: Rehabilitation  ONSET DATE: "a few years"  SUBJECTIVE:  SUBJECTIVE STATEMENT: I am sore  PERTINENT HISTORY: DM type 2, has gotten steroid injections in shoulder  PAIN:  Are you having pain? Yes: NPRS scale: 6.5/10 Pain location: L shoulder sometimes down into elbow and by my armpit Pain description: sharp, throbbing, burning Aggravating factors: moving, driving Relieving factors: ice packs  PRECAUTIONS: None  WEIGHT BEARING RESTRICTIONS: No  FALLS:  Has patient fallen in last 6 months? No  LIVING ENVIRONMENT: Lives with: lives with their family Lives in: House/apartment Stairs: Yes: Internal: 14 steps; on right going up Has following equipment at home: None  OCCUPATION: Janitorial work  PLOF: Independent  PATIENT GOALS:getting the pain to go away and getting better at moving it  NEXT MD VISIT:    OBJECTIVE:   DIAGNOSTIC FINDINGS:  IMPRESSION: 1. Partial-thickness midsubstance tear of the mid to anterior supraspinatus tendon footprint measuring up to 10 mm in AP dimension. 2. Moderate degenerative changes of the acromioclavicular joint. High-grade marrow edema within the clavicular head suggests this may represent a source of pain. 3. Mild glenohumeral cartilage degenerative changes  PATIENT SURVEYS:  FOTO 38  COGNITION: Overall cognitive status: Within functional limits for tasks assessed     SENSATION: WFL  POSTURE:  Rounded shoulders  UPPER EXTREMITY ROM:   Active ROM Right eval Left eval  Shoulder flexion WFL 92 w/pain  Shoulder extension    Shoulder abduction WFL 80 w/pain  Shoulder adduction    Shoulder internal rotation  40 w/pain  Shoulder external rotation  35 w/pain  Elbow flexion    Elbow extension    Wrist flexion    Wrist extension    Wrist ulnar deviation    Wrist radial deviation    Wrist pronation    Wrist supination    (Blank rows = not tested)  UPPER EXTREMITY MMT:  MMT Right eval Left eval  Shoulder flexion WNL 2-  Shoulder extension    Shoulder abduction WNL 2-  Shoulder adduction    Shoulder internal rotation WNL 2-  Shoulder external rotation WNL 2-  Middle trapezius    Lower trapezius    Elbow flexion    Elbow extension    Wrist flexion    Wrist extension    Wrist ulnar deviation    Wrist radial deviation    Wrist pronation    Wrist supination    Grip strength (lbs)    (Blank rows = not tested)  SHOULDER SPECIAL TESTS: Impingement tests: Neer impingement test: positive , Hawkins/Kennedy impingement test: positive , and Painful arc test: positive   Rotator cuff assessment: Empty can test: positive  and Full can test: positive    JOINT MOBILITY TESTING:  increased muscle guarding and pain limiting PROM  PALPATION:  TTP anterior shoulder with light pressure,    TODAY'S TREATMENT:  DATE: 02/10/23:  Nustep level 3 3 min Ue's and LE's  Supine manual stretching, inf glides L GH jt at 90 degrees abd, alternating with GH distraction , shaking Supine horizontal isometric abduction B shoulders, with strap 5 sec holds 10 reps  Side lying R for L shoulder Er , arm against trunk 1# 15 reps Side lying R for scapular clocks therapist providing tactile cues  Seated rows 20 # 15 reps , decreased Rom due to pain L shoulder Cw/ccw L shoulder rotations, hand on large blue ball on table 15 reps each Finger ladder flexion 5 reps.  Standing L shoulder flexion and IR 5 sec holds, isometrics  Iontophoresis applied to [facility] to superiorly, for 4 hrs, 80 MAM advised of possible skin reddening, to leave on for 4 hrs    02/08/23 Pulleys flexion and abd x10 Wall slides flexion and abd x10 ER/IR isometric against ball x10 Pendulums side to side, circles x10 PROM all directions  ER/IR supine with yellow band 2x10 Supine abduction and flexion AROM 2x10   02/03/23 UBE L1 x3 mins forwards, 1x back stopped due to pain  PROM all directions 5s holds at end range Lateral distraction to L shoulder- pt states this feels good  A/P grade 2 mobs and inferior glides for pain modulation Supine AAROM flexion with dowel 2x10 Sidelying ER 2# 2x10 Wall slides flexion and abd x10  Standing rows red 2x10 Ice to R shoulder    01/21/23 Manual:  Supine for gentle distraction/ compression of humerus, at 45 degrees scaption position, also A/P glides L humeral head , gr 2, gentle AAROM L shoulder all planes, 10 reps, to improve jt mobility R sidelying with L hand on table in supported horizontal adduction position, for scapular glides, sup/inf, ant/post, and diagonals  Kinesiotaping 2 I pieces anchored anterior deltoid attachments, ant and med , extending to mid scapular upper and middle traps, with 35%  pull.    Therex:  reviewed current home program, and adapted, added : -pendulum L shoulder, with 2# for gh jt distraction and mobility -seated isometric L shoulder ER , 30 to 45 sec holds for pain management, cognitive pain inibition, advised to perform 3 to 5 reps  Advised to continue with : -theraband extension B shoulders, emphasis on scapular movement -theraband rows -standing L shouder abd with wand  Advised to hold theraband Er and IR currently due to level of irritability L shoulder Added moist heat L shoulder for 5 min post Rx for trial as patient may use at home   PATIENT EDUCATION: Education details: POC and HEP Person educated: Patient Education method: Explanation Education comprehension: verbalized understanding  HOME EXERCISE PROGRAM: Access Code: [email or web address] URL: [email or web address] Date: 01/21/2023 Prepared by: Andris Baumann  Exercises - Supine Shoulder Flexion Extension AAROM with Dowel  - 1 x daily - 7 x weekly - 2 sets - 10 reps - Standing Shoulder Abduction AAROM with Dowel  - 1 x daily - 7 x weekly - 2 sets - 10 reps - Standing Shoulder Row with Anchored Resistance  - 1 x daily - 7 x weekly - 2 sets - 10 reps - Shoulder extension with resistance - Neutral  - 1 x daily - 7 x weekly - 2 sets - 10 reps - Shoulder External Rotation with Anchored Resistance  - 1 x daily - 7 x weekly - 2 sets - 10 reps - Shoulder Internal Rotation with Resistance  - 1 x daily - 7 x weekly - 2 sets -  10 reps  ASSESSMENT:  CLINICAL IMPRESSION: Patient continues to demonstrate a high level of resting pain at 61/2 to 7 most exercises having to be performed with arm below 90 degrees elevation.  Added iontophoresis today to reduce localized pain and inflammation L AC jt.  Will need reassessment soon of objective measures: Rom, strength.   OBJECTIVE IMPAIRMENTS: decreased ROM, decreased strength, impaired UE functional use, and pain.   ACTIVITY LIMITATIONS: carrying,  lifting, and reach over head  REHAB POTENTIAL: Good  CLINICAL DECISION MAKING: Stable/uncomplicated  EVALUATION COMPLEXITY: Low  GOALS: Goals reviewed with patient? Yes  SHORT TERM GOALS: Target date: 02/25/23  Patient will be independent with initial HEP.  Goal status: INITIAL   LONG TERM GOALS: Target date: 04/08/23  Patient will be independent with advanced/ongoing HEP to improve outcomes and carryover.  Goal status: INITIAL  2.  Patient will report 75% improvement in L shoulder pain to improve QOL.  Baseline: 8/10 Goal status: INITIAL  3.  Patient to improve L shoulder AROM to [facility] without pain provocation to allow for increased ease of ADLs.  Goal status: INITIAL  4.  Patient will demonstrate improved functional UE strength as demonstrated by 4/5 or better. Baseline: 2-/5 Goal status: INITIAL  5.  Patient will report 23 on FOTO to demonstrate improved functional ability.  Baseline: 38 Goal status: INITIAL  PLAN:  PT FREQUENCY: 2x/week  PT DURATION: 10 weeks  PLANNED INTERVENTIONS: Therapeutic exercises, Therapeutic activity, Neuromuscular re-education, Balance training, Gait training, Patient/Family education, Self Care, Joint mobilization, Dry Needling, Electrical stimulation, Cryotherapy, Moist heat, Ultrasound, Ionotophoresis 76m/ml Dexamethasone, and Manual therapy  PLAN FOR NEXT SESSION: AAROM, PROM, light strengthening, ice/heat   Lakyn Alsteen L Naveh Rickles, PT 02/10/2023, 3:36 PM +

## 2023-02-15 ENCOUNTER — Other Ambulatory Visit: Payer: Self-pay

## 2023-02-15 ENCOUNTER — Ambulatory Visit: Payer: 59

## 2023-02-15 DIAGNOSIS — M25612 Stiffness of left shoulder, not elsewhere classified: Secondary | ICD-10-CM

## 2023-02-15 DIAGNOSIS — M75112 Incomplete rotator cuff tear or rupture of left shoulder, not specified as traumatic: Secondary | ICD-10-CM

## 2023-02-15 DIAGNOSIS — M25512 Pain in left shoulder: Secondary | ICD-10-CM | POA: Diagnosis not present

## 2023-02-15 DIAGNOSIS — M6281 Muscle weakness (generalized): Secondary | ICD-10-CM

## 2023-02-15 DIAGNOSIS — G8929 Other chronic pain: Secondary | ICD-10-CM

## 2023-02-15 NOTE — Therapy (Signed)
OUTPATIENT PHYSICAL THERAPY SHOULDER TREATMENT   Patient Name: Meredith York MRN: [id number] DOB:1976-12-12, 47 y.o., female Today's Date: 02/15/2023  END OF SESSION:  PT End of Session - 02/15/23 1355     Visit Number 6    Date for PT Re-Evaluation 04/08/23    PT Start Time [id number]    PT Stop Time 1430    PT Time Calculation (min) 45 min    Activity Tolerance Patient limited by pain;Patient tolerated treatment well                Past Medical History:  Diagnosis Date   Carpal tunnel syndrome on right    Chlamydia    Gestational diabetes mellitus    Hypertension    no medicaqtions at this time   Migraines    Ovarian cyst    S/P lumbar discectomy 04/10/2015   Tendonitis    right wrist   Trichomonas    Type 2 diabetes mellitus ([place])    not currently taking medication   Past Surgical History:  Procedure Laterality Date   ABDOMINAL HYSTERECTOMY     BACK SURGERY     CESAREAN SECTION  12/27/2011   Procedure: CESAREAN SECTION;  Surgeon: Shelly Bombard, MD;  Location: [place] [facility];  Service: Gynecology;  Laterality: N/A;  Primary cesarean section with delivery of baby girl at 3. Apgars 3/3/6   CESAREAN SECTION N/A 08/03/2016   Procedure: CESAREAN SECTION;  Surgeon: Osborne Oman, MD;  Location: [place];  Service: Obstetrics;  Laterality: N/A;   CHOLECYSTECTOMY  02/2009   DILATION AND CURETTAGE OF UTERUS N/A 06/25/2014   Procedure: DILATATION AND CURETTAGE;  Surgeon: Donnamae Jude, MD;  Location: [place] [facility];  Service: Gynecology;  Laterality: N/A;   LUMBAR LAMINECTOMY/DECOMPRESSION MICRODISCECTOMY Left 04/10/2015   Procedure: Left L5-S1 Microdiscectomy;  Surgeon: Marybelle Killings, MD;  Location: [place];  Service: Orthopedics;  Laterality: Left;   VAGINAL HYSTERECTOMY N/A 06/04/2020   Procedure: HYSTERECTOMY VAGINAL, MORCELLATION;  Surgeon: Chancy Milroy, MD;  Location: [facility];  Service: Gynecology;  Laterality: N/A;   WISDOM TOOTH EXTRACTION      Patient Active Problem List   Diagnosis Date Noted   Rotator cuff tear, left 01/18/2023   OA (osteoarthritis) of shoulder 12/03/2022   Peroneal tendon tear, left, subsequent encounter 01/17/2021   Recurrent herniation of lumbar disc 10/30/2020   Prediabetes 09/24/2020   Post-operative state 06/04/2020   Visit for routine gyn exam 11/29/2019   Encounter for screening mammogram for breast cancer 11/29/2019   History of bilateral tubal ligation 11/29/2019   S/P cesarean section 07/14/2016   Hypertension 07/05/2011    PCP: Minette Brine  REFERRING PROVIDER: Clearance Coots  REFERRING DIAG: [id number], M19.012  THERAPY DIAG:  Chronic left shoulder pain  Stiffness of left shoulder, not elsewhere classified  Nontraumatic incomplete tear of left rotator cuff  Muscle weakness (generalized)  Rationale for Evaluation and Treatment: Rehabilitation  ONSET DATE: "a few years"  SUBJECTIVE:  SUBJECTIVE STATEMENT: I am sore  PERTINENT HISTORY: DM type 2, has gotten steroid injections in shoulder  PAIN:  Are you having pain? Yes: NPRS scale: 7.5/10 Pain location: L shoulder sometimes down into elbow and by my armpit Pain description: sharp, throbbing, burning Aggravating factors: moving, driving Relieving factors: ice packs  PRECAUTIONS: None  WEIGHT BEARING RESTRICTIONS: No  FALLS:  Has patient fallen in last 6 months? No  LIVING ENVIRONMENT: Lives with: lives with their family Lives in: House/apartment Stairs: Yes: Internal: 14 steps; on right going up Has following equipment at home: None  OCCUPATION: Janitorial work  PLOF: Independent  PATIENT GOALS:getting the pain to go away and getting better at moving it  NEXT MD VISIT:   OBJECTIVE:   DIAGNOSTIC FINDINGS:  IMPRESSION: 1.  Partial-thickness midsubstance tear of the mid to anterior supraspinatus tendon footprint measuring up to 10 mm in AP dimension. 2. Moderate degenerative changes of the acromioclavicular joint. High-grade marrow edema within the clavicular head suggests this may represent a source of pain. 3. Mild glenohumeral cartilage degenerative changes  PATIENT SURVEYS:  FOTO 38  COGNITION: Overall cognitive status: Within functional limits for tasks assessed     SENSATION: WFL  POSTURE:  Rounded shoulders  UPPER EXTREMITY ROM:   Active ROM Right eval Left eval  Shoulder flexion WFL 92 w/pain  Shoulder extension    Shoulder abduction WFL 80 w/pain  Shoulder adduction    Shoulder internal rotation  40 w/pain  Shoulder external rotation  35 w/pain  Elbow flexion    Elbow extension    Wrist flexion    Wrist extension    Wrist ulnar deviation    Wrist radial deviation    Wrist pronation    Wrist supination    (Blank rows = not tested)  UPPER EXTREMITY MMT:  MMT Right eval Left eval  Shoulder flexion WNL 2-  Shoulder extension    Shoulder abduction WNL 2-  Shoulder adduction    Shoulder internal rotation WNL 2-  Shoulder external rotation WNL 2-  Middle trapezius    Lower trapezius    Elbow flexion    Elbow extension    Wrist flexion    Wrist extension    Wrist ulnar deviation    Wrist radial deviation    Wrist pronation    Wrist supination    Grip strength (lbs)    (Blank rows = not tested)  SHOULDER SPECIAL TESTS: Impingement tests: Neer impingement test: positive , Hawkins/Kennedy impingement test: positive , and Painful arc test: positive   Rotator cuff assessment: Empty can test: positive  and Full can test: positive    JOINT MOBILITY TESTING:  increased muscle guarding and pain limiting PROM  PALPATION:  TTP anterior shoulder with light pressure,    TODAY'S TREATMENT:  DATE:  02/15/23:  Moist heat L shoulder for 5 min, in supine, prior to stretching Manual:  Supine gentle GH distraction with stretching into flex,abd 10 - 15 reps Supine inf glides, A/P glides L humeral head, gr 2 2 bouts  Prone for PA HVLA mid thoracic mobs   Therex:  Prone L shoulder horizontal abduction 15x Prone L shoulder shoulder extension 15x R sidelying for with shoulder flexed,fist on table, for scapular clocks multiple directions to improve motor recruitment and proprioceptive awareness scapula Supine for PA stretch thoracic spine, towel rolled and parallel to spine, supporting head, 1 min stretch with palms up, shoulders abducted 30 degrees.  Iontophoresis applied to [facility] to superiorly, for 4 hrs, 80 MAM advised of possible skin reddening, to leave on for 4 hrs   02/10/23:  Nustep level 3 3 min Ue's and LE's  Supine manual stretching, inf glides L GH jt at 90 degrees abd, alternating with GH distraction , shaking Supine horizontal isometric abduction B shoulders, with strap 5 sec holds 10 reps  Side lying R for L shoulder Er , arm against trunk 1# 15 reps Side lying R for scapular clocks therapist providing tactile cues  Seated rows 20 # 15 reps , decreased Rom due to pain L shoulder Cw/ccw L shoulder rotations, hand on large blue ball on table 15 reps each Finger ladder flexion 5 reps.  Standing L shoulder flexion and IR 5 sec holds, isometrics  Iontophoresis applied to [facility] to superiorly, for 4 hrs, 80 MAM advised of possible skin reddening, to leave on for 4 hrs    02/08/23 Pulleys flexion and abd x10 Wall slides flexion and abd x10 ER/IR isometric against ball x10 Pendulums side to side, circles x10 PROM all directions  ER/IR supine with yellow band 2x10 Supine abduction and flexion AROM 2x10   02/03/23 UBE L1 x3 mins forwards, 1x back stopped due to pain  PROM all directions 5s holds at end range Lateral distraction  to L shoulder- pt states this feels good  A/P grade 2 mobs and inferior glides for pain modulation Supine AAROM flexion with dowel 2x10 Sidelying ER 2# 2x10 Wall slides flexion and abd x10  Standing rows red 2x10 Ice to R shoulder    01/21/23 Manual:  Supine for gentle distraction/ compression of humerus, at 45 degrees scaption position, also A/P glides L humeral head , gr 2, gentle AAROM L shoulder all planes, 10 reps, to improve jt mobility R sidelying with L hand on table in supported horizontal adduction position, for scapular glides, sup/inf, ant/post, and diagonals  Kinesiotaping 2 I pieces anchored anterior deltoid attachments, ant and med , extending to mid scapular upper and middle traps, with 35% pull.    Therex:  reviewed current home program, and adapted, added : -pendulum L shoulder, with 2# for gh jt distraction and mobility -seated isometric L shoulder ER , 30 to 45 sec holds for pain management, cognitive pain inibition, advised to perform 3 to 5 reps  Advised to continue with : -theraband extension B shoulders, emphasis on scapular movement -theraband rows -standing L shouder abd with wand  Advised to hold theraband Er and IR currently due to level of irritability L shoulder Added moist heat L shoulder for 5 min post Rx for trial as patient may use at home   PATIENT EDUCATION: Education details: POC and HEP Person educated: Patient Education method: Explanation Education comprehension: verbalized understanding  HOME EXERCISE PROGRAM: Access Code: [email or web address] URL: [email or web address] Date: 01/21/2023 Prepared  by: Andris Baumann  Exercises - Supine Shoulder Flexion Extension AAROM with Dowel  - 1 x daily - 7 x weekly - 2 sets - 10 reps - Standing Shoulder Abduction AAROM with Dowel  - 1 x daily - 7 x weekly - 2 sets - 10 reps - Standing Shoulder Row with Anchored Resistance  - 1 x daily - 7 x weekly - 2 sets - 10 reps - Shoulder extension with  resistance - Neutral  - 1 x daily - 7 x weekly - 2 sets - 10 reps - Shoulder External Rotation with Anchored Resistance  - 1 x daily - 7 x weekly - 2 sets - 10 reps - Shoulder Internal Rotation with Resistance  - 1 x daily - 7 x weekly - 2 sets - 10 reps  ASSESSMENT:  CLINICAL IMPRESSION: Continuing with skilled physical therapy to address L shoulder pain and dysfunction with known L Rctear.   Patient continues to demonstrate a high level of resting pain at 7 1/2 today.  Adapted today's session again for pain management and imrproving her jt mobility, movement.  Still very stiff.  most exercises having to be performed with arm below 90 degrees elevation.  Continued with iontophoresis today to reduce localized pain and inflammation L AC jt.  Will need reassessment soon of objective measures: Rom, strength. , the iontophoresis helped reduce pain for about 2 hs after removal last week.  Advised that we would max out with it over 6 visits.  OBJECTIVE IMPAIRMENTS: decreased ROM, decreased strength, impaired UE functional use, and pain.   ACTIVITY LIMITATIONS: carrying, lifting, and reach over head  REHAB POTENTIAL: Good  CLINICAL DECISION MAKING: Stable/uncomplicated  EVALUATION COMPLEXITY: Low  GOALS: Goals reviewed with patient? Yes  SHORT TERM GOALS: Target date: 02/25/23  Patient will be independent with initial HEP.  Goal status: INITIAL   LONG TERM GOALS: Target date: 04/08/23  Patient will be independent with advanced/ongoing HEP to improve outcomes and carryover.  Goal status: INITIAL  2.  Patient will report 75% improvement in L shoulder pain to improve QOL.  Baseline: 8/10 Goal status: INITIAL  3.  Patient to improve L shoulder AROM to [facility] without pain provocation to allow for increased ease of ADLs.  Goal status: INITIAL  4.  Patient will demonstrate improved functional UE strength as demonstrated by 4/5 or better. Baseline: 2-/5 Goal status: INITIAL  5.  Patient will  report 52 on FOTO to demonstrate improved functional ability.  Baseline: 38 Goal status: INITIAL  PLAN:  PT FREQUENCY: 2x/week  PT DURATION: 10 weeks  PLANNED INTERVENTIONS: Therapeutic exercises, Therapeutic activity, Neuromuscular re-education, Balance training, Gait training, Patient/Family education, Self Care, Joint mobilization, Dry Needling, Electrical stimulation, Cryotherapy, Moist heat, Ultrasound, Ionotophoresis '4mg'$ /ml Dexamethasone, and Manual therapy  PLAN FOR NEXT SESSION: AAROM, PROM, light strengthening, ice/heat   Cristen Bredeson L Sarajean Dessert, PT 02/15/2023, 5:40 PM +

## 2023-02-17 ENCOUNTER — Ambulatory Visit: Payer: 59

## 2023-02-17 DIAGNOSIS — M6281 Muscle weakness (generalized): Secondary | ICD-10-CM

## 2023-02-17 DIAGNOSIS — M75112 Incomplete rotator cuff tear or rupture of left shoulder, not specified as traumatic: Secondary | ICD-10-CM

## 2023-02-17 DIAGNOSIS — G8929 Other chronic pain: Secondary | ICD-10-CM

## 2023-02-17 DIAGNOSIS — M25612 Stiffness of left shoulder, not elsewhere classified: Secondary | ICD-10-CM

## 2023-02-17 DIAGNOSIS — M25512 Pain in left shoulder: Secondary | ICD-10-CM | POA: Diagnosis not present

## 2023-02-17 NOTE — Therapy (Signed)
OUTPATIENT PHYSICAL THERAPY SHOULDER DISCHARGE SUMMARY   Patient Name: Meredith York MRN: [id number] DOB:06-Dec-1976, 47 y.o., female Today's Date: 02/17/2023  END OF SESSION:  PT End of Session - 02/17/23 1348     Visit Number 7    Date for PT Re-Evaluation 04/08/23    PT Start Time [id number]    PT Stop Time 1430    PT Time Calculation (min) 45 min    Activity Tolerance Patient limited by pain;Patient tolerated treatment well    Behavior During Therapy [facility] for tasks assessed/performed                Past Medical History:  Diagnosis Date   Carpal tunnel syndrome on right    Chlamydia    Gestational diabetes mellitus    Hypertension    no medicaqtions at this time   Migraines    Ovarian cyst    S/P lumbar discectomy 04/10/2015   Tendonitis    right wrist   Trichomonas    Type 2 diabetes mellitus ([place])    not currently taking medication   Past Surgical History:  Procedure Laterality Date   ABDOMINAL HYSTERECTOMY     BACK SURGERY     CESAREAN SECTION  12/27/2011   Procedure: CESAREAN SECTION;  Surgeon: Shelly Bombard, MD;  Location: [facility];  Service: Gynecology;  Laterality: N/A;  Primary cesarean section with delivery of baby girl at 50. Apgars 3/3/6   CESAREAN SECTION N/A 08/03/2016   Procedure: CESAREAN SECTION;  Surgeon: Osborne Oman, MD;  Location: [place];  Service: Obstetrics;  Laterality: N/A;   CHOLECYSTECTOMY  02/2009   DILATION AND CURETTAGE OF UTERUS N/A 06/25/2014   Procedure: DILATATION AND CURETTAGE;  Surgeon: Donnamae Jude, MD;  Location: [facility];  Service: Gynecology;  Laterality: N/A;   LUMBAR LAMINECTOMY/DECOMPRESSION MICRODISCECTOMY Left 04/10/2015   Procedure: Left L5-S1 Microdiscectomy;  Surgeon: Marybelle Killings, MD;  Location: [place];  Service: Orthopedics;  Laterality: Left;   VAGINAL HYSTERECTOMY N/A 06/04/2020   Procedure: HYSTERECTOMY VAGINAL, MORCELLATION;  Surgeon: Chancy Milroy, MD;  Location[facility];  Service:  Gynecology;  Laterality: N/A;   WISDOM TOOTH EXTRACTION     Patient Active Problem List   Diagnosis Date Noted   Rotator cuff tear, left 01/18/2023   OA (osteoarthritis) of shoulder 12/03/2022   Peroneal tendon tear, left, subsequent encounter 01/17/2021   Recurrent herniation of lumbar disc 10/30/2020   Prediabetes 09/24/2020   Post-operative state 06/04/2020   Visit for routine gyn exam 11/29/2019   Encounter for screening mammogram for breast cancer 11/29/2019   History of bilateral tubal ligation 11/29/2019   S/P cesarean section 07/14/2016   Hypertension 07/05/2011    PCP: Minette Brine  REFERRING PROVIDER: Clearance Coots  REFERRING DIAG: [id number], M19.012  THERAPY DIAG:  Chronic left shoulder pain  Stiffness of left shoulder, not elsewhere classified  Nontraumatic incomplete tear of left rotator cuff  Muscle weakness (generalized)  Rationale for Evaluation and Treatment: Rehabilitation  ONSET DATE: "a few years"  SUBJECTIVE:  SUBJECTIVE STATEMENT: I am sore, can't tell much difference in my L shoulder, it still really hurts  PERTINENT HISTORY: DM type 2, has gotten steroid injections in shoulder  PAIN:  Are you having pain? Yes: NPRS scale: 7.5/10 Pain location: L shoulder sometimes down into elbow and by my armpit Pain description: sharp, throbbing, burning Aggravating factors: moving, driving Relieving factors: ice packs  PRECAUTIONS: None  WEIGHT BEARING RESTRICTIONS: No  FALLS:  Has patient fallen in last 6 months? No  LIVING ENVIRONMENT: Lives with: lives with their family Lives in: House/apartment Stairs: Yes: Internal: 14 steps; on right going up Has following equipment at home: None  OCCUPATION: Janitorial work  PLOF: Independent  PATIENT GOALS:getting the  pain to go away and getting better at moving it  NEXT MD VISIT:   OBJECTIVE:   DIAGNOSTIC FINDINGS:  IMPRESSION: 1. Partial-thickness midsubstance tear of the mid to anterior supraspinatus tendon footprint measuring up to 10 mm in AP dimension. 2. Moderate degenerative changes of the acromioclavicular joint. High-grade marrow edema within the clavicular head suggests this may represent a source of pain. 3. Mild glenohumeral cartilage degenerative changes  PATIENT SURVEYS:  FOTO 38  COGNITION: Overall cognitive status: Within functional limits for tasks assessed     SENSATION: WFL  POSTURE:  Rounded shoulders  UPPER EXTREMITY ROM:   Active ROM Right eval Left eval 02/17/23  Shoulder flexion WFL 92 w/pain 88 w/pain  Shoulder extension     Shoulder abduction WFL 80 w/pain 64 with pain  Shoulder adduction     Shoulder internal rotation  40 w/pain 70  Shoulder external rotation  35 w/pain 55  Elbow flexion     Elbow extension     Wrist flexion     Wrist extension     Wrist ulnar deviation     Wrist radial deviation     Wrist pronation     Wrist supination     (Blank rows = not tested)  UPPER EXTREMITY MMT:  MMT Right eval Left eval L 02/17/23:  Shoulder flexion WNL 2- 3-  Shoulder extension     Shoulder abduction WNL 2- 3-  Shoulder adduction     Shoulder internal rotation WNL 2- 3-  Shoulder external rotation WNL 2- 3-  Middle trapezius     Lower trapezius     Elbow flexion     Elbow extension     Wrist flexion     Wrist extension     Wrist ulnar deviation     Wrist radial deviation     Wrist pronation     Wrist supination     Grip strength (lbs)     (Blank rows = not tested)  SHOULDER SPECIAL TESTS: Impingement tests: Neer impingement test: positive , Hawkins/Kennedy impingement test: positive , and Painful arc test: positive   Rotator cuff assessment: Empty can test: positive  and Full can test: positive    JOINT MOBILITY TESTING:  increased  muscle guarding and pain limiting PROM  PALPATION:  TTP anterior shoulder with light pressure,    TODAY'S TREATMENT:  DATE:  02/17/23:  UBE 2.5 min F, 2.5 min back low resistance for tissue perfusion, mid range joint mobility Supine for combined manual stretching with techniques for better Gh jt spacing throughout stretch, including distraction, shaking, stabilization of scapula against trunk. Prone for HVLA T 4, and cervicothoracic jxn Prone L shoulder extension with 2#, Prone pendulums with 2# Prone horizontal abduction no weight, tactile cues to engage middle traps.   Iontophoresis applied to [facility] to superiorly, for 4 hrs, 80 MAM advised of possible skin reddening, to leave on for 4 hrs    02/15/23:  Moist heat L shoulder for 5 min, in supine, prior to stretching Manual:  Supine gentle GH distraction with stretching into flex,abd 10 - 15 reps Supine inf glides, A/P glides L humeral head, gr 2 2 bouts  Prone for PA HVLA mid thoracic mobs   Therex:  Prone L shoulder horizontal abduction 15x Prone L shoulder shoulder extension 15x R sidelying for with shoulder flexed,fist on table, for scapular clocks multiple directions to improve motor recruitment and proprioceptive awareness scapula Supine for PA stretch thoracic spine, towel rolled and parallel to spine, supporting head, 1 min stretch with palms up, shoulders abducted 30 degrees.  Iontophoresis applied to [facility] to superiorly, for 4 hrs, 80 MAM advised of possible skin reddening, to leave on for 4 hrs   02/10/23:  Nustep level 3 3 min Ue's and LE's  Supine manual stretching, inf glides L GH jt at 90 degrees abd, alternating with GH distraction , shaking Supine horizontal isometric abduction B shoulders, with strap 5 sec holds 10 reps  Side lying R for L shoulder Er , arm against trunk 1# 15 reps Side  lying R for scapular clocks therapist providing tactile cues  Seated rows 20 # 15 reps , decreased Rom due to pain L shoulder Cw/ccw L shoulder rotations, hand on large blue ball on table 15 reps each Finger ladder flexion 5 reps.  Standing L shoulder flexion and IR 5 sec holds, isometrics  Iontophoresis applied to [facility] to superiorly, for 4 hrs, 80 MAM advised of possible skin reddening, to leave on for 4 hrs    02/08/23 Pulleys flexion and abd x10 Wall slides flexion and abd x10 ER/IR isometric against ball x10 Pendulums side to side, circles x10 PROM all directions  ER/IR supine with yellow band 2x10 Supine abduction and flexion AROM 2x10   02/03/23 UBE L1 x3 mins forwards, 1x back stopped due to pain  PROM all directions 5s holds at end range Lateral distraction to L shoulder- pt states this feels good  A/P grade 2 mobs and inferior glides for pain modulation Supine AAROM flexion with dowel 2x10 Sidelying ER 2# 2x10 Wall slides flexion and abd x10  Standing rows red 2x10 Ice to R shoulder    01/21/23 Manual:  Supine for gentle distraction/ compression of humerus, at 45 degrees scaption position, also A/P glides L humeral head , gr 2, gentle AAROM L shoulder all planes, 10 reps, to improve jt mobility R sidelying with L hand on table in supported horizontal adduction position, for scapular glides, sup/inf, ant/post, and diagonals  Kinesiotaping 2 I pieces anchored anterior deltoid attachments, ant and med , extending to mid scapular upper and middle traps, with 35% pull.    Therex:  reviewed current home program, and adapted, added : -pendulum L shoulder, with 2# for gh jt distraction and mobility -seated isometric L shoulder ER , 30 to 45 sec holds for pain management, cognitive  pain inibition, advised to perform 3 to 5 reps  Advised to continue with : -theraband extension B shoulders, emphasis on scapular movement -theraband rows -standing L shouder abd with wand  Advised  to hold theraband Er and IR currently due to level of irritability L shoulder Added moist heat L shoulder for 5 min post Rx for trial as patient may use at home   PATIENT EDUCATION: Education details: POC and HEP Person educated: Patient Education method: Explanation Education comprehension: verbalized understanding  HOME EXERCISE PROGRAM: Access Code: [email or web address] URL: [email or web address] Date: 02/17/2023 Prepared by: Catalino Plascencia  Exercises - Prone Single Arm Shoulder Horizontal Abduction with Dumbbell - Palm Down  - 1 x daily - 3 x weekly - 3 sets - 10 reps - Prone Single Arm Shoulder Horizontal Abduction with Dumbbell - Palm Down  - 1 x daily - 3 x weekly - 3 sets - 10 reps - Prone Shoulder Row in Abduction  - 1 x daily - 3 x weekly - 3 sets - 10 reps Access Code: [email or web address] URL: [email or web address] Date: 01/21/2023 Prepared by: Andris Baumann  Exercises - Supine Shoulder Flexion Extension AAROM with Dowel  - 1 x daily - 7 x weekly - 2 sets - 10 reps - Standing Shoulder Abduction AAROM with Dowel  - 1 x daily - 7 x weekly - 2 sets - 10 reps - Standing Shoulder Row with Anchored Resistance  - 1 x daily - 7 x weekly - 2 sets - 10 reps - Shoulder extension with resistance - Neutral  - 1 x daily - 7 x weekly - 2 sets - 10 reps - Shoulder External Rotation with Anchored Resistance  - 1 x daily - 7 x weekly - 2 sets - 10 reps - Shoulder Internal Rotation with Resistance  - 1 x daily - 7 x weekly - 2 sets - 10 reps  ASSESSMENT:  CLINICAL IMPRESSION: Continuing with skilled physical therapy to address L shoulder pain and dysfunction with known L RC tear.  The patient is still quite inflamed, high pain score, over 7 today and consistently over several recent visits , also pain increases with activity, even the UBE, indicating high irritability.  Painful palpation along superior jt. Line. Reassessment with improved IR/ER motion and overall strength.  Patient is going  to return to her referring MD.  Is competent with her home program.      OBJECTIVE IMPAIRMENTS: decreased ROM, decreased strength, impaired UE functional use, and pain.   ACTIVITY LIMITATIONS: carrying, lifting, and reach over head  REHAB POTENTIAL: Good  CLINICAL DECISION MAKING: Stable/uncomplicated  EVALUATION COMPLEXITY: Low  GOALS: Goals reviewed with patient? Yes  SHORT TERM GOALS: Target date: 02/25/23  Patient will be independent with initial HEP.  Goal status: IN PROGRESS   LONG TERM GOALS: Target date: 04/08/23  Patient will be independent with advanced/ongoing HEP to improve outcomes and carryover.  Goal status: IN PROGRESS  2.  Patient will report 75% improvement in L shoulder pain to improve QOL.  Baseline: 8/10 Goal status: NOT MET 7 1/2 to 8/10 consistently  3.  Patient to improve L shoulder AROM to [facility] without pain provocation to allow for increased ease of ADLs.  Goal status: NOT MET  4.  Patient will demonstrate improved functional UE strength as demonstrated by 4/5 or better. Baseline: 2-/5 Goal status: IN PROGRESS  5.  Patient will report 62 on FOTO to demonstrate improved functional ability.  Baseline: 38 Goal status: IN PROGRESS  PLAN:  PT FREQUENCY:DC today, to return to referring MD PT DURATION: 10 weeks  PLANNED INTERVENTIONS: Therapeutic exercises, Therapeutic activity, Neuromuscular re-education, Balance training, Gait training, Patient/Family education, Self Care, Joint mobilization, Dry Needling, Electrical stimulation, Cryotherapy, Moist heat, Ultrasound, Ionotophoresis '4mg'$ /ml Dexamethasone, and Manual therapy  PLAN FOR NEXT SESSION: Dc today   Ammon Muscatello L Jassica Zazueta, PT 02/17/2023, 4:09 PM +

## 2023-03-02 ENCOUNTER — Encounter: Payer: Self-pay | Admitting: Family Medicine

## 2023-03-02 ENCOUNTER — Ambulatory Visit (INDEPENDENT_AMBULATORY_CARE_PROVIDER_SITE_OTHER): Payer: Medicaid Other | Admitting: Family Medicine

## 2023-03-02 VITALS — BP 144/90 | Ht 62.5 in | Wt 193.0 lb

## 2023-03-02 DIAGNOSIS — M75112 Incomplete rotator cuff tear or rupture of left shoulder, not specified as traumatic: Secondary | ICD-10-CM | POA: Diagnosis present

## 2023-03-02 NOTE — Assessment & Plan Note (Signed)
Acute on chronic in nature.  She has pain on the proximal portion of the shoulder as well as laterally.  She has changes in the rotator cuff as well as AC joint and glenohumeral joint.  Has tried physical therapy and a glenohumeral injection. -counseled  on home exercise therapy and supportive care. -referral to orthopedic surgeon. -Provided work note.

## 2023-03-02 NOTE — Progress Notes (Signed)
  Meredith York - 47 y.o. female MRN [id number]  Date of birth: 12/21/1976  SUBJECTIVE:  Including CC & ROS.  No chief complaint on file.   Meredith York is a 47 y.o. female that is presenting with worsening of her left shoulder pain.  The pain has been ongoing for several months.  She has tried physical therapy for weeks and still continues to have pain.  It is worse at the end of the day.    Review of Systems See HPI   HISTORY: Past Medical, Surgical, Social, and Family History Reviewed & Updated per EMR.   Pertinent Historical Findings include:  Past Medical History:  Diagnosis Date   Carpal tunnel syndrome on right    Chlamydia    Gestational diabetes mellitus    Hypertension    no medicaqtions at this time   Migraines    Ovarian cyst    S/P lumbar discectomy 04/10/2015   Tendonitis    right wrist   Trichomonas    Type 2 diabetes mellitus ([place])    not currently taking medication    Past Surgical History:  Procedure Laterality Date   ABDOMINAL HYSTERECTOMY     BACK SURGERY     CESAREAN SECTION  12/27/2011   Procedure: CESAREAN SECTION;  Surgeon: Shelly Bombard, MD;  Location: [place] [facility];  Service: Gynecology;  Laterality: N/A;  Primary cesarean section with delivery of baby girl at 49. Apgars 3/3/6   CESAREAN SECTION N/A 08/03/2016   Procedure: CESAREAN SECTION;  Surgeon: Osborne Oman, MD;  Location: [place];  Service: Obstetrics;  Laterality: N/A;   CHOLECYSTECTOMY  02/2009   DILATION AND CURETTAGE OF UTERUS N/A 06/25/2014   Procedure: DILATATION AND CURETTAGE;  Surgeon: Donnamae Jude, MD;  Location: [place] [facility];  Service: Gynecology;  Laterality: N/A;   LUMBAR LAMINECTOMY/DECOMPRESSION MICRODISCECTOMY Left 04/10/2015   Procedure: Left L5-S1 Microdiscectomy;  Surgeon: Marybelle Killings, MD;  Location: [place];  Service: Orthopedics;  Laterality: Left;   VAGINAL HYSTERECTOMY N/A 06/04/2020   Procedure: HYSTERECTOMY VAGINAL, MORCELLATION;  Surgeon: Chancy Milroy, MD;   Location: [facility];  Service: Gynecology;  Laterality: N/A;   WISDOM TOOTH EXTRACTION       PHYSICAL EXAM:  VS: BP (!) 144/90   Ht 5' 2.5" (1.588 m)   Wt 193 lb (87.5 kg)   LMP  (LMP Unknown)   BMI 34.74 kg/m  Physical Exam Gen: NAD, alert, cooperative with exam, well-appearing MSK:  Neurovascularly intact       ASSESSMENT & PLAN:   Rotator cuff tear, left Acute on chronic in nature.  She has pain on the proximal portion of the shoulder as well as laterally.  She has changes in the rotator cuff as well as AC joint and glenohumeral joint.  Has tried physical therapy and a glenohumeral injection. -counseled  on home exercise therapy and supportive care. -referral to orthopedic surgeon. -Provided work note.

## 2023-03-02 NOTE — Patient Instructions (Signed)
Good to see you We have made a referral to the surgeon  Please send me a message in [place] with any questions or updates.  Please see me back as needed.   --Dr. Raeford Razor

## 2023-04-05 ENCOUNTER — Encounter: Payer: Self-pay | Admitting: *Deleted

## 2023-04-06 HISTORY — PX: OTHER SURGICAL HISTORY: SHX169

## 2023-04-20 ENCOUNTER — Other Ambulatory Visit: Payer: Self-pay

## 2023-04-20 ENCOUNTER — Ambulatory Visit: Payer: 59 | Attending: Family Medicine

## 2023-04-20 DIAGNOSIS — M25612 Stiffness of left shoulder, not elsewhere classified: Secondary | ICD-10-CM | POA: Diagnosis present

## 2023-04-20 DIAGNOSIS — M25512 Pain in left shoulder: Secondary | ICD-10-CM | POA: Diagnosis not present

## 2023-04-20 DIAGNOSIS — M6281 Muscle weakness (generalized): Secondary | ICD-10-CM | POA: Diagnosis present

## 2023-04-20 NOTE — Therapy (Signed)
OUTPATIENT PHYSICAL THERAPY SHOULDER EVALUATION   Patient Name: Meredith York MRN: [id number] DOB:03/23/1976, 47 y.o., female Today's Date: 04/20/2023  END OF SESSION:  PT End of Session - 04/20/23 1423     Visit Number 1    Number of Visits 17    Date for PT Re-Evaluation 06/25/23    Authorization Type [employer]; [place] [employer]ICA[employer] OF [place]    Progress Note Due on Visit 10    PT Start Time 1416    PT Stop Time 1500    PT Time Calculation (min) 44 min    Activity Tolerance Patient tolerated treatment well    Behavior During Therapy WFL for tasks assessed/performed             Past Medical History:  Diagnosis Date   Carpal tunnel syndrome on right    Chlamydia    Gestational diabetes mellitus    Hypertension    no medicaqtions at this time   Migraines    Ovarian cyst    S/P lumbar discectomy 04/10/2015   Tendonitis    right wrist   Trichomonas    Type 2 diabetes mellitus (HCC)    not currently taking medication   Past Surgical History:  Procedure Laterality Date   ABDOMINAL HYSTERECTOMY     BACK SURGERY     CESAREAN SECTION  12/27/2011   Procedure: CESAREAN SECTION;  Surgeon: Brock Bad, MD;  Location: [facility];  Service: Gynecology;  Laterality: N/A;  Primary cesarean section with delivery of baby girl at 76. Apgars 3/3/6   CESAREAN SECTION N/A 08/03/2016   Procedure: CESAREAN SECTION;  Surgeon: Tereso Newcomer, MD;  Location: [facility];  Service: Obstetrics;  Laterality: N/A;   CHOLECYSTECTOMY  02/2009   DILATION AND CURETTAGE OF UTERUS N/A 06/25/2014   Procedure: DILATATION AND CURETTAGE;  Surgeon: Reva Bores, MD;  Location: [facility];  Service: Gynecology;  Laterality: N/A;   LUMBAR LAMINECTOMY/DECOMPRESSION MICRODISCECTOMY Left 04/10/2015   Procedure: Left L5-S1 Microdiscectomy;  Surgeon: Eldred Manges, MD;  Location: [facility];  Service: Orthopedics;  Laterality: Left;   VAGINAL HYSTERECTOMY N/A 06/04/2020   Procedure: HYSTERECTOMY VAGINAL,  MORCELLATION;  Surgeon: Hermina Staggers, MD;  Location: [facility];  Service: Gynecology;  Laterality: N/A;   WISDOM TOOTH EXTRACTION     Patient Active Problem List   Diagnosis Date Noted   Rotator cuff tear, left 01/18/2023   OA (osteoarthritis) of shoulder 12/03/2022   Peroneal tendon tear, left, subsequent encounter 01/17/2021   Recurrent herniation of lumbar disc 10/30/2020   Prediabetes 09/24/2020   Post-operative state 06/04/2020   Visit for routine gyn exam 11/29/2019   Encounter for screening mammogram for breast cancer 11/29/2019   History of bilateral tubal ligation 11/29/2019   S/P cesarean section 07/14/2016   Hypertension 07/05/2011    PCP: [name], FNP  REFERRING PROVIDER: Jones Broom, MD   REFERRING DIAG: s/p left shoulder cuff /debridment ,SAD DCE 04/06/2023   THERAPY DIAG:  Acute pain of left shoulder  Stiffness of left shoulder, not elsewhere classified  Muscle weakness (generalized)  Rationale for Evaluation and Treatment: Rehabilitation  ONSET DATE: 4/16 surgery; Initial issue approx 2 years  SUBJECTIVE:  SUBJECTIVE STATEMENT: Pt reports wear and tear issues with her L shoulder for approx 2 years prior to surgery. Following surgery, the L GH jt area is tender, but the overall pain is much better. She notes experiencing an occasional popping sesation with some l shoulder movements  Hand dominance: Right  PERTINENT HISTORY: High BMI-heavy arm; HTN  PAIN:  Are you having pain? Yes: NPRS scale: 0-5/10 Pain location: L GH area Pain description: ache, throb Aggravating factors: Certain movement Relieving factors: Rest, Ice packs  PRECAUTIONS: None  WEIGHT BEARING RESTRICTIONS: No WBAT  FALLS:  Has patient fallen in last 6 months? 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 Partial-thickness midsubstance tear of the mid to anterior supraspinatus tendon footprint measuring up to 10 mm in AP dimension. 2. Moderate degenerative changes of the acromioclavicular joint. High-grade marrow edema within the clavicular head suggests this may represent a source of pain. 3. Mild glenohumeral cartilage degenerative changes.  PATIENT SURVEYS:  FOTO: Perceived function   65%, predicted   68%   COGNITION: Overall cognitive status: Within functional limits for tasks assessed     SENSATION: WFL  POSTURE: Forward ead, rounded shoulders  UPPER EXTREMITY ROM:   Active ROM Right eval Left eval  Shoulder flexion 150 A62; P80  Shoulder extension    Shoulder abduction 154 A72; P85  Shoulder adduction    Shoulder internal rotation T4 A T1; P40  Shoulder external rotation T7 A GT hip; P70  Elbow flexion    Elbow extension    Wrist flexion    Wrist extension    Wrist ulnar deviation    Wrist radial deviation    Wrist pronation    Wrist supination    (Blank rows = not tested)  UPPER EXTREMITY MMT: L shoulder generally 2+ to 3_ MMT Right eval Left eval  Shoulder flexion 5   Shoulder extension 5   Shoulder abduction 5   Shoulder adduction 5   Shoulder internal rotation 5   Shoulder external rotation 5   Middle trapezius    Lower trapezius    Elbow flexion    Elbow extension    Wrist flexion    Wrist extension    Wrist ulnar deviation    Wrist radial deviation    Wrist pronation    Wrist supination    Grip  strength (lbs)    (Blank rows = not tested)  SHOULDER SPECIAL TESTS: Impingement tests:  NT SLAP lesions:  NT Instability tests:  NT Rotator cuff assessment:  NT Biceps assessment:  NT  PALPATION:  TTP peri-GH jt area   TODAY'S TREATMENT:  [facility] Adult PT Treatment:                                                DATE: 04/20/23 Therapeutic Exercise: Developed, instructed in, and pt completed therex as noted in HEP  Modalities: Cold pack to the L shoulder 10 mins Self Care: Use of cold pack 10-15 mins as needed    PATIENT EDUCATION: Education details: Eval findings, POC, HEP, self care  Person educated: Patient Education method: Explanation, Demonstration, Tactile cues, Verbal cues, and Handouts Education comprehension: verbalized understanding, returned demonstration, verbal cues required, and tactile cues required  HOME EXERCISE PROGRAM: Access Code: [email or web address] URL: [email or web address] Date: 04/20/2023 Prepared by: Joellyn Rued  Exercises - Seated Shoulder Flexion Towel Slide at Table Top Full Range of Motion  - 3 x daily - 7 x weekly - 1 sets - 15 reps - 3 hold - Seated Shoulder Scaption Slide at Table Top with Forearm in Neutral  - 3 x daily - 7 x weekly - 3 sets - 10 reps - 3 hold - Seated Shoulder External Rotation PROM on Table  - 3 x daily - 7 x weekly - 3 sets - 10 reps - 3 hold  ASSESSMENT:  CLINICAL IMPRESSION: Patient is a 47 y.o. female who was seen today for physical therapy evaluation and treatment for s/p left shoulder cuff /debridment ,SAD, DCE 04/06/2023 .   OBJECTIVE IMPAIRMENTS: decreased ROM, decreased strength, impaired UE functional use, obesity, and pain.   ACTIVITY LIMITATIONS: carrying, lifting, dressing, reach over head, and caring for others  PARTICIPATION LIMITATIONS: meal prep, cleaning, laundry,  driving, shopping, and occupation  PERSONAL FACTORS: Fitness, Past/current experiences, Time since onset of injury/illness/exacerbation, and 1 comorbidity: igh BMI-heavy arm  are also affecting patient's functional outcome.   REHAB POTENTIAL: Good  CLINICAL DECISION MAKING: Evolving/moderate complexity  EVALUATION COMPLEXITY: Moderate   GOALS:  SHORT TERM GOALS: Target date: 05/07/23  Pt will be Ind in an initial HEP Baseline: started Goal status: INITIAL  2.  Pt will voice understanding of measures to assist in pain reduction  Baseline: started Goal status: INITIAL  3.  Increase L shoulder PROM: flexion- 120, abd- 120d, ER- 60, IR 80d Baseline: see flow sheet Goal status: INITIAL  LONG TERM GOALS: Target date: 06/25/23  Pt will be Ind in a final HEP to maintain achieved LOF Baseline: started Goal status: INITIAL  2.  Increase L shoulder AROM to within 90% of the R for improved functional use of the L UE Baseline: see flow sheets Goal status: INITIAL  3.  Increase L shoulder strength to 4+/5 or greater for improved functional use of the L UE Baseline: 2+ to 3-/5 Goal status: INITIAL  4.  Pt will be able to lift 2lbs x10 reps above shoulder height to reflect demands consistent with her work Baseline: NT  Goal status: INITIAL  5.  Pt will be able to lift 10lbs x10 reps 2 handed form knee to chest height to reflect demands consistent with her work Baseline: NT Goal status: INITIAL   PLAN:  PT FREQUENCY: 2x/week  PT DURATION: 8 weeks  PLANNED INTERVENTIONS: Therapeutic exercises, Therapeutic activity, Patient/Family education, Self Care, Joint mobilization, Dry Needling, Cryotherapy, Moist heat, Taping, Vasopneumatic device, Traction, Ionotophoresis 4mg /ml Dexamethasone, Manual therapy, and Re-evaluation  PLAN FOR NEXT SESSION: Review FOTO; assess response to HEP; progress therex as  indicated (isometric and periscapular strengthening); use of modalities, manual  therapy; and TPDN as indicated.  Krystina Strieter MS, PT 04/20/23 6:28 PM

## 2023-04-21 NOTE — Therapy (Signed)
OUTPATIENT PHYSICAL THERAPY TREATMENT NOTE   Patient Name: Meredith York MRN: [id number] DOB:October 10, 1976, 47 y.o., female Today's Date: 04/22/2023  PCP: [name], FNP  REFERRING PROVIDER: Jones Broom, MD   END OF SESSION:   PT End of Session - 04/22/23 0723     Visit Number 2    Number of Visits 17    Date for PT Re-Evaluation 06/25/23    Authorization Type [employer]; [place] MEDICAID [employer] CARIT[employer] OF [place]    Authorization - Visit Number 1    Authorization - Number of Visits 5    Progress Note Due on Visit 10    PT Start Time 0717    PT Stop Time 0809    PT Time Calculation (min) 52 min    Activity Tolerance Patient tolerated treatment well    Behavior During Therapy [facility] for tasks assessed/performed             Past Medical History:  Diagnosis Date   Carpal tunnel syndrome on right    Chlamydia    Gestational diabetes mellitus    Hypertension    no medicaqtions at this time   Migraines    Ovarian cyst    S/P lumbar discectomy 04/10/2015   Tendonitis    right wrist   Trichomonas    Type 2 diabetes mellitus (HCC)    not currently taking medication   Past Surgical History:  Procedure Laterality Date   ABDOMINAL HYSTERECTOMY     BACK SURGERY     CESAREAN SECTION  12/27/2011   Procedure: CESAREAN SECTION;  Surgeon: Brock Bad, MD;  Location: [facility];  Service: Gynecology;  Laterality: N/A;  Primary cesarean section with delivery of baby girl at 57. Apgars 3/3/6   CESAREAN SECTION N/A 08/03/2016   Procedure: CESAREAN SECTION;  Surgeon: Tereso Newcomer, MD;  Location: [facility];  Service: Obstetrics;  Laterality: N/A;   CHOLECYSTECTOMY  02/2009   DILATION AND CURETTAGE OF UTERUS N/A 06/25/2014   Procedure: DILATATION AND CURETTAGE;  Surgeon: Reva Bores, MD;  Location: [facility];  Service: Gynecology;  Laterality: N/A;   LUMBAR LAMINECTOMY/DECOMPRESSION MICRODISCECTOMY Left 04/10/2015   Procedure: Left L5-S1 Microdiscectomy;  Surgeon: Eldred Manges, MD;  Location: [facility] OR;  Service: Orthopedics;  Laterality: Left;   VAGINAL HYSTERECTOMY N/A 06/04/2020   Procedure: HYSTERECTOMY VAGINAL, MORCELLATION;  Surgeon: Hermina Staggers, MD;  Location: [facility];  Service: Gynecology;  Laterality: N/A;   WISDOM TOOTH EXTRACTION     Patient Active Problem List   Diagnosis Date Noted   Rotator cuff tear, left 01/18/2023   OA (osteoarthritis) of shoulder 12/03/2022   Peroneal tendon tear, left, subsequent encounter 01/17/2021   Recurrent herniation of lumbar disc 10/30/2020   Prediabetes 09/24/2020   Post-operative state 06/04/2020   Visit for routine gyn exam 11/29/2019   Encounter for screening mammogram for breast cancer 11/29/2019   History of bilateral tubal ligation 11/29/2019   S/P cesarean section 07/14/2016   Hypertension 07/05/2011    REFERRING DIAG: s/p left shoulder cuff /debridment ,SAD DCE 04/06/2023   THERAPY DIAG:  Acute pain of left shoulder  Stiffness of left shoulder, not elsewhere classified  Muscle weakness (generalized)  Rationale for Evaluation and Treatment Rehabilitation  ONSET DATE: 4/16 surgery; Initial issue approx 2 years   SUBJECTIVE:  SUBJECTIVE STATEMENT: Pt reports some soreness of her L shoulder after therex, but a cold pack afterwards helps to decrease the soreness.  Hand dominance: Right   PAIN:  Are you having pain? Yes: NPRS scale: 5/10 Pain location: L GH area Pain description: ache, throb Aggravating factors: Certain movement Relieving factors: Rest, Ice packs  PERTINENT HISTORY: High BMI-heavy arm; HTN   PRECAUTIONS: None   WEIGHT BEARING RESTRICTIONS: No WBAT   FALLS:  Has patient fallen in last 6 months? 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 Partial-thickness midsubstance tear of the mid to anterior supraspinatus tendon footprint measuring up to 10 mm in AP dimension. 2. Moderate degenerative changes of the acromioclavicular joint. High-grade marrow edema within the clavicular head suggests this may represent a source of pain. 3. Mild glenohumeral cartilage degenerative changes.   PATIENT SURVEYS:  FOTO: Perceived function   65%, predicted   68%    COGNITION: Overall cognitive status: Within functional limits for tasks assessed                                  SENSATION: WFL   POSTURE: Forward ead, rounded shoulders   UPPER EXTREMITY ROM:    Active ROM Right eval Left eval LT 04/22/23  Shoulder flexion 150 A62; P80 AA120  Shoulder extension       Shoulder abduction 154 A72; P85   Shoulder adduction       Shoulder internal rotation T4 A T1; P40   Shoulder external rotation T7 A GT hip; P70   Elbow flexion       Elbow extension       Wrist flexion       Wrist extension       Wrist ulnar deviation       Wrist radial deviation       Wrist pronation       Wrist supination       (Blank rows = not tested)   UPPER EXTREMITY MMT: L shoulder generally 2+ to 3_ MMT Right eval Left eval  Shoulder flexion 5    Shoulder extension 5    Shoulder abduction 5    Shoulder adduction 5    Shoulder internal rotation 5    Shoulder external rotation 5    Middle trapezius      Lower trapezius      Elbow flexion      Elbow extension      Wrist flexion       Wrist extension      Wrist ulnar deviation      Wrist radial deviation      Wrist pronation      Wrist supination      Grip strength (lbs)      (Blank rows = not tested)   SHOULDER SPECIAL TESTS: Impingement tests:  NT SLAP lesions:  NT Instability tests:  NT Rotator cuff assessment:  NT Biceps assessment:  NT  PALPATION:  TTP peri-GH jt area             TODAY'S TREATMENT:        [facility] Adult PT Treatment:                                                DATE: 04/22/23 Therapeutic Exercise: Pulley for flexion and scaption 1' each Shoulder rows 2x10 RTB Shoulder isometrics flex, ext, abd, IR, ER x10 5" Pt AA ROM table top for flexion, abd, ER x10 each Manual Therapy: L shoulder  PA, Inf glide, distraction grades 2 and 3 L shoulder PROM all motions L scapular PROM all motions                                                                                                                           [facility] Adult PT Treatment:                                                DATE: 04/20/23 Therapeutic Exercise: Developed, instructed in, and pt completed therex as noted in HEP  Modalities: Cold pack to the L shoulder 10 mins Self Care: Use of cold pack 10-15 mins as needed      PATIENT EDUCATION: Education details: Eval findings, POC, HEP, self care  Person educated: Patient Education method: Explanation, Demonstration, Tactile cues, Verbal cues, and Handouts Education comprehension: verbalized understanding, returned demonstration, verbal cues required, and tactile cues required   HOME EXERCISE PROGRAM: Access Code: [email or web address] URL: [email or web address] Date: 04/20/2023 Prepared by: Joellyn Rued   Exercises - Seated Shoulder Flexion Towel Slide at Table Top Full Range of Motion  - 3 x daily - 7 x weekly - 1 sets - 15 reps - 3 hold - Seated Shoulder Scaption Slide at Table Top with Forearm in Neutral  - 3 x daily - 7 x weekly - 3 sets - 10 reps - 3 hold - Seated  Shoulder External Rotation PROM on Table  - 3 x daily - 7 x weekly - 3 sets - 10 reps - 3 hold   ASSESSMENT:   CLINICAL IMPRESSION: PT was completed for L GH jt and scapular ROM and gentle strengthening. Periscapular and isometric strengthening was initiated. HEP was updated and pt returned proper demonstration of therex. Pt tolerated PT with min increase in L shoulder pain. A cold pack was applied at the end of the session for symptom management. Pt will continue to benefit from skilled PT to address impairments for improved function of the L shoulder c decreased pain   OBJECTIVE IMPAIRMENTS: decreased ROM, decreased strength, impaired UE functional use, obesity, and pain.    ACTIVITY LIMITATIONS: carrying, lifting, dressing, reach over head,  and caring for others   PARTICIPATION LIMITATIONS: meal prep, cleaning, laundry, driving, shopping, and occupation   PERSONAL FACTORS: Fitness, Past/current experiences, Time since onset of injury/illness/exacerbation, and 1 comorbidity: igh BMI-heavy arm  are also affecting patient's functional outcome.    REHAB POTENTIAL: Good   CLINICAL DECISION MAKING: Evolving/moderate complexity   EVALUATION COMPLEXITY: Moderate     GOALS:   SHORT TERM GOALS: Target date: 05/07/23   Pt will be Ind in an initial HEP Baseline: started Goal status: INITIAL   2.  Pt will voice understanding of measures to assist in pain reduction  Baseline: started Goal status: INITIAL   3.  Increase L shoulder PROM: flexion- 120, abd- 120d, ER- 60, IR 80d Baseline: see flow sheet Goal status: INITIAL   LONG TERM GOALS: Target date: 06/25/23   Pt will be Ind in a final HEP to maintain achieved LOF Baseline: started Goal status: INITIAL   2.  Increase L shoulder AROM to within 90% of the R for improved functional use of the L UE Baseline: see flow sheets Goal status: INITIAL   3.  Increase L shoulder strength to 4+/5 or greater for improved functional use of the L  UE Baseline: 2+ to 3-/5 Goal status: INITIAL   4.  Pt will be able to lift 2lbs x10 reps above shoulder height to reflect demands consistent with her work Baseline: NT  Goal status: INITIAL   5.  Pt will be able to lift 10lbs x10 reps 2 handed form knee to chest height to reflect demands consistent with her work Baseline: NT Goal status: INITIAL     PLAN:   PT FREQUENCY: 2x/week   PT DURATION: 8 weeks   PLANNED INTERVENTIONS: Therapeutic exercises, Therapeutic activity, Patient/Family education, Self Care, Joint mobilization, Dry Needling, Cryotherapy, Moist heat, Taping, Vasopneumatic device, Traction, Ionotophoresis 4mg /ml Dexamethasone, Manual therapy, and Re-evaluation   PLAN FOR NEXT SESSION: Review FOTO; assess response to HEP; progress therex as indicated (isometric and periscapular strengthening); use of modalities, manual therapy; and TPDN as indicated.    Ludie Hudon MS, PT 04/22/23 9:08 AM

## 2023-04-22 ENCOUNTER — Ambulatory Visit: Payer: 59 | Attending: Family Medicine

## 2023-04-22 DIAGNOSIS — M25512 Pain in left shoulder: Secondary | ICD-10-CM | POA: Diagnosis present

## 2023-04-22 DIAGNOSIS — M25612 Stiffness of left shoulder, not elsewhere classified: Secondary | ICD-10-CM

## 2023-04-22 DIAGNOSIS — G8929 Other chronic pain: Secondary | ICD-10-CM | POA: Insufficient documentation

## 2023-04-22 DIAGNOSIS — M6281 Muscle weakness (generalized): Secondary | ICD-10-CM | POA: Insufficient documentation

## 2023-04-28 ENCOUNTER — Encounter: Payer: Self-pay | Admitting: Physical Therapy

## 2023-04-28 ENCOUNTER — Ambulatory Visit: Payer: 59 | Admitting: Physical Therapy

## 2023-04-28 DIAGNOSIS — M25612 Stiffness of left shoulder, not elsewhere classified: Secondary | ICD-10-CM

## 2023-04-28 DIAGNOSIS — M25512 Pain in left shoulder: Secondary | ICD-10-CM | POA: Diagnosis not present

## 2023-04-28 NOTE — Therapy (Signed)
OUTPATIENT PHYSICAL THERAPY TREATMENT NOTE   Patient Name: Meredith York MRN: [id number] DOB:05/07/1976, 47 y.o., female Today's Date: 04/28/2023  PCP: [name], FNP  REFERRING PROVIDER: Jones Broom, MD   END OF SESSION:   PT End of Session - 04/28/23 0719     Visit Number 3    Number of Visits 17    Date for PT Re-Evaluation 06/25/23    Authorization Type [employer]; [place] [employer] OF [place]    Authorization - Visit Number 2    Authorization - Number of Visits 5    Progress Note Due on Visit 10    PT Start Time 0717    PT Stop Time 0805    PT Time Calculation (min) 48 min             Past Medical History:  Diagnosis Date   Carpal tunnel syndrome on right    Chlamydia    Gestational diabetes mellitus    Hypertension    no medicaqtions at this time   Migraines    Ovarian cyst    S/P lumbar discectomy 04/10/2015   Tendonitis    right wrist   Trichomonas    Type 2 diabetes mellitus (HCC)    not currently taking medication   Past Surgical History:  Procedure Laterality Date   ABDOMINAL HYSTERECTOMY     BACK SURGERY     CESAREAN SECTION  12/27/2011   Procedure: CESAREAN SECTION;  Surgeon: Brock Bad, MD;  Location: [facility];  Service: Gynecology;  Laterality: N/A;  Primary cesarean section with delivery of baby girl at 85. Apgars 3/3/6   CESAREAN SECTION N/A 08/03/2016   Procedure: CESAREAN SECTION;  Surgeon: Tereso Newcomer, MD;  Location: [facility];  Service: Obstetrics;  Laterality: N/A;   CHOLECYSTECTOMY  02/2009   DILATION AND CURETTAGE OF UTERUS N/A 06/25/2014   Procedure: DILATATION AND CURETTAGE;  Surgeon: Reva Bores, MD;  Location: [facility];  Service: Gynecology;  Laterality: N/A;   LUMBAR LAMINECTOMY/DECOMPRESSION MICRODISCECTOMY Left 04/10/2015   Procedure: Left L5-S1 Microdiscectomy;  Surgeon: Eldred Manges, MD;  Location: [facility] OR;  Service: Orthopedics;  Laterality: Left;   VAGINAL HYSTERECTOMY N/A 06/04/2020    Procedure: HYSTERECTOMY VAGINAL, MORCELLATION;  Surgeon: Hermina Staggers, MD;  Location: [facility];  Service: Gynecology;  Laterality: N/A;   WISDOM TOOTH EXTRACTION     Patient Active Problem List   Diagnosis Date Noted   Rotator cuff tear, left 01/18/2023   OA (osteoarthritis) of shoulder 12/03/2022   Peroneal tendon tear, left, subsequent encounter 01/17/2021   Recurrent herniation of lumbar disc 10/30/2020   Prediabetes 09/24/2020   Post-operative state 06/04/2020   Visit for routine gyn exam 11/29/2019   Encounter for screening mammogram for breast cancer 11/29/2019   History of bilateral tubal ligation 11/29/2019   S/P cesarean section 07/14/2016   Hypertension 07/05/2011    REFERRING DIAG: s/p left shoulder cuff /debridment ,SAD DCE 04/06/2023   THERAPY DIAG:  Acute pain of left shoulder  Stiffness of left shoulder, not elsewhere classified  Rationale for Evaluation and Treatment Rehabilitation  ONSET DATE: 4/16 surgery; Initial issue approx 2 years   SUBJECTIVE:  SUBJECTIVE STATEMENT: Pt reports stiffness in the mornings. Today 6/10. Quick movements can aggravate the pain. I have been using ice pack more.   Hand dominance: Right   PAIN:  Are you having pain? Yes: NPRS scale: 5/10 Pain location: L GH area Pain description: ache, throb Aggravating factors: Certain movement Relieving factors: Rest, Ice packs  PERTINENT HISTORY: High BMI-heavy arm; HTN   PRECAUTIONS: None   WEIGHT BEARING RESTRICTIONS: No WBAT   FALLS:  Has patient fallen in last 6 months? 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 Partial-thickness midsubstance tear of the mid to anterior supraspinatus tendon footprint measuring up to 10 mm in AP dimension. 2. Moderate degenerative changes of the acromioclavicular joint. High-grade marrow edema within the clavicular head suggests this may represent a source of pain. 3. Mild glenohumeral cartilage degenerative changes.   PATIENT SURVEYS:  FOTO: Perceived function   65%, predicted   68%    COGNITION: Overall cognitive status: Within functional limits for tasks assessed                                  SENSATION: WFL   POSTURE: Forward ead, rounded shoulders   UPPER EXTREMITY ROM:    Active ROM Right eval Left eval LT 04/22/23 LT 04/28/23  Shoulder flexion 150 A62; P80 AA120 P125  Shoulder extension        Shoulder abduction 154 A72; P85  P115  Shoulder adduction        Shoulder internal rotation T4 A T1; P40  P 60  Shoulder external rotation T7 A GT hip; P70  P 90  Elbow flexion        Elbow extension        Wrist flexion        Wrist extension        Wrist ulnar deviation        Wrist radial deviation        Wrist pronation        Wrist supination        (Blank rows = not tested)   UPPER EXTREMITY MMT: L shoulder generally 2+ to 3_ MMT Right eval Left eval  Shoulder flexion 5    Shoulder extension 5    Shoulder abduction 5    Shoulder adduction 5    Shoulder internal rotation 5    Shoulder external rotation 5    Middle trapezius      Lower trapezius      Elbow flexion      Elbow extension      Wrist flexion      Wrist extension      Wrist ulnar deviation      Wrist radial deviation      Wrist pronation       Wrist supination      Grip strength (lbs)      (Blank rows = not tested)   SHOULDER SPECIAL TESTS: Impingement tests:  NT SLAP lesions:  NT Instability tests:  NT Rotator cuff assessment:  NT Biceps assessment:  NT   PALPATION:  TTP peri-GH jt area             TODAY'S TREATMENT:        [facility] Adult PT Treatment:                                                DATE: 04/28/23 Therapeutic Exercise: Pulleys  Counter walk back stretch for shoulder flexion Isometrics  flex, ext, abd, IR, ER x10 5" Seated table slides flexion, scaption, abduction Seated table ER PROM Supine dowel press  Supine dowel press to pullover  Manual Therapy: GHJ -inferior and A/P glides PROM flexion, abduction, IR, ER Modalities:  Ice pack left shoulder x 10 min    [facility] Adult PT Treatment:                                                DATE: 04/22/23 Therapeutic Exercise: Pulley for flexion and scaption 1' each Shoulder rows 2x10 RTB Shoulder isometrics flex, ext, abd, IR, ER x10 5" Pt AA ROM table top for flexion, abd, ER x10 each Manual Therapy: L shoulder  PA, Inf glide, distraction grades 2 and 3 L shoulder PROM all motions L scapular PROM all motions                                                                                                                           [facility] Adult PT Treatment:                                                DATE: 04/20/23 Therapeutic Exercise: Developed, instructed in, and pt completed therex as noted in HEP  Modalities: Cold pack to the L shoulder 10 mins Self Care: Use of cold pack 10-15 mins as needed      PATIENT EDUCATION: Education details: Eval findings, POC, HEP, self care  Person educated: Patient Education method: Explanation, Demonstration, Tactile cues, Verbal cues, and Handouts Education comprehension: verbalized understanding, returned demonstration, verbal cues required, and tactile cues required   HOME EXERCISE PROGRAM: Access Code:  [email or web address] URL: [email or web address] Date: 04/20/2023 Prepared by: Joellyn Rued   Exercises - Seated Shoulder Flexion Towel Slide at Table Top Full Range of Motion  - 3 x daily - 7 x weekly - 1 sets - 15 reps - 3 hold - Seated Shoulder Scaption Slide at Table Top with Forearm in Neutral  - 3 x daily - 7 x weekly - 3 sets - 10  reps - 3 hold - Seated Shoulder External Rotation PROM on Table  - 3 x daily - 7 x weekly - 3 sets - 10 reps - 3 hold   ASSESSMENT:   CLINICAL IMPRESSION: PT was completed for L GH jt and scapular ROM and gentle strengthening. Continued periscapular and isometric strengthening. Began Supine AAROM with some increased end range pain. She has difficulty relaxing for PROM.  A cold pack was applied at the end of the session for symptom management. Pt will continue to benefit from skilled PT to address impairments for improved function of the L shoulder c decreased pain   OBJECTIVE IMPAIRMENTS: decreased ROM, decreased strength, impaired UE functional use, obesity, and pain.    ACTIVITY LIMITATIONS: carrying, lifting, dressing, reach over head, and caring for others   PARTICIPATION LIMITATIONS: meal prep, cleaning, laundry, driving, shopping, and occupation   PERSONAL FACTORS: Fitness, Past/current experiences, Time since onset of injury/illness/exacerbation, and 1 comorbidity: igh BMI-heavy arm  are also affecting patient's functional outcome.    REHAB POTENTIAL: Good   CLINICAL DECISION MAKING: Evolving/moderate complexity   EVALUATION COMPLEXITY: Moderate     GOALS:   SHORT TERM GOALS: Target date: 05/07/23   Pt will be Ind in an initial HEP Baseline: started Goal status: INITIAL   2.  Pt will voice understanding of measures to assist in pain reduction  Baseline: started Goal status: INITIAL   3.  Increase L shoulder PROM: flexion- 120, abd- 120d, ER- 60, IR 80d Baseline: see flow sheet Goal status: INITIAL   LONG TERM GOALS: Target date:  06/25/23   Pt will be Ind in a final HEP to maintain achieved LOF Baseline: started Goal status: INITIAL   2.  Increase L shoulder AROM to within 90% of the R for improved functional use of the L UE Baseline: see flow sheets Goal status: INITIAL   3.  Increase L shoulder strength to 4+/5 or greater for improved functional use of the L UE Baseline: 2+ to 3-/5 Goal status: INITIAL   4.  Pt will be able to lift 2lbs x10 reps above shoulder height to reflect demands consistent with her work Baseline: NT  Goal status: INITIAL   5.  Pt will be able to lift 10lbs x10 reps 2 handed form knee to chest height to reflect demands consistent with her work Baseline: NT Goal status: INITIAL     PLAN:   PT FREQUENCY: 2x/week   PT DURATION: 8 weeks   PLANNED INTERVENTIONS: Therapeutic exercises, Therapeutic activity, Patient/Family education, Self Care, Joint mobilization, Dry Needling, Cryotherapy, Moist heat, Taping, Vasopneumatic device, Traction, Ionotophoresis 4mg /ml Dexamethasone, Manual therapy, and Re-evaluation   PLAN FOR NEXT SESSION: Review FOTO; assess response to HEP; progress therex as indicated (isometric and periscapular strengthening); use of modalities, manual therapy; and TPDN as indicated.    Jannette Spanner, PTA 04/28/23 8:59 AM Phone: [phone] Fax: [phone]

## 2023-05-04 ENCOUNTER — Ambulatory Visit: Payer: 59 | Admitting: Physical Therapy

## 2023-05-04 ENCOUNTER — Encounter: Payer: Self-pay | Admitting: Physical Therapy

## 2023-05-04 DIAGNOSIS — M25612 Stiffness of left shoulder, not elsewhere classified: Secondary | ICD-10-CM

## 2023-05-04 DIAGNOSIS — M25512 Pain in left shoulder: Secondary | ICD-10-CM

## 2023-05-04 NOTE — Therapy (Signed)
OUTPATIENT PHYSICAL THERAPY TREATMENT NOTE   Patient Name: Meredith York MRN: [id number] DOB:October 04, 1976, 47 y.o., female Today's Date: 05/04/2023  PCP: [name], FNP  REFERRING PROVIDER: Jones Broom, MD   END OF SESSION:   PT End of Session - 05/04/23 1146     Visit Number 4    Number of Visits 17    Date for PT Re-Evaluation 06/25/23    Authorization Type [employer]; [place] MEDICA[employer] OF [place]    Authorization Time Period Request auth on 5th treatment date    Authorization - Visit Number 3    Authorization - Number of Visits 5    Progress Note Due on Visit 10    PT Start Time 1147    PT Stop Time 1230    PT Time Calculation (min) 43 min             Past Medical History:  Diagnosis Date   Carpal tunnel syndrome on right    Chlamydia    Gestational diabetes mellitus    Hypertension    no medicaqtions at this time   Migraines    Ovarian cyst    S/P lumbar discectomy 04/10/2015   Tendonitis    right wrist   Trichomonas    Type 2 diabetes mellitus (HCC)    not currently taking medication   Past Surgical History:  Procedure Laterality Date   ABDOMINAL HYSTERECTOMY     BACK SURGERY     CESAREAN SECTION  12/27/2011   Procedure: CESAREAN SECTION;  Surgeon: Brock Bad, MD;  Location: [facility];  Service: Gynecology;  Laterality: N/A;  Primary cesarean section with delivery of baby girl at 79. Apgars 3/3/6   CESAREAN SECTION N/A 08/03/2016   Procedure: CESAREAN SECTION;  Surgeon: Tereso Newcomer, MD;  Location: [facility];  Service: Obstetrics;  Laterality: N/A;   CHOLECYSTECTOMY  02/2009   DILATION AND CURETTAGE OF UTERUS N/A 06/25/2014   Procedure: DILATATION AND CURETTAGE;  Surgeon: Reva Bores, MD;  Location: [facility];  Service: Gynecology;  Laterality: N/A;   LUMBAR LAMINECTOMY/DECOMPRESSION MICRODISCECTOMY Left 04/10/2015   Procedure: Left L5-S1 Microdiscectomy;  Surgeon: Eldred Manges, MD;  Location: [facility];  Service: Orthopedics;   Laterality: Left;   VAGINAL HYSTERECTOMY N/A 06/04/2020   Procedure: HYSTERECTOMY VAGINAL, MORCELLATION;  Surgeon: Hermina Staggers, MD;  Location: [facility];  Service: Gynecology;  Laterality: N/A;   WISDOM TOOTH EXTRACTION     Patient Active Problem List   Diagnosis Date Noted   Rotator cuff tear, left 01/18/2023   OA (osteoarthritis) of shoulder 12/03/2022   Peroneal tendon tear, left, subsequent encounter 01/17/2021   Recurrent herniation of lumbar disc 10/30/2020   Prediabetes 09/24/2020   Post-operative state 06/04/2020   Visit for routine gyn exam 11/29/2019   Encounter for screening mammogram for breast cancer 11/29/2019   History of bilateral tubal ligation 11/29/2019   S/P cesarean section 07/14/2016   Hypertension 07/05/2011    REFERRING DIAG: s/p left shoulder cuff /debridment ,SAD DCE 04/06/2023   THERAPY DIAG:  Acute pain of left shoulder  Stiffness of left shoulder, not elsewhere classified  Rationale for Evaluation and Treatment Rehabilitation  ONSET DATE: 4/16 surgery; Initial issue approx 2 years   SUBJECTIVE:  SUBJECTIVE STATEMENT: I've been doing the exercises.  Today 5/10. Trying to desensitize and tolerate the bra strap on my shoulder.    Hand dominance: Right   PAIN:  Are you having pain? Yes: NPRS scale: 5/10 Pain location: L GH area Pain description: ache, throb Aggravating factors: Certain movement Relieving factors: Rest, Ice packs  PERTINENT HISTORY: High BMI-heavy arm; HTN   PRECAUTIONS: None   WEIGHT BEARING RESTRICTIONS: No WBAT   FALLS:  Has patient fallen in last 6 months? 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 Partial-thickness midsubstance tear of the mid to anterior supraspinatus tendon footprint measuring up to 10 mm in AP dimension. 2. Moderate degenerative changes of the acromioclavicular joint. High-grade marrow edema within the clavicular head suggests this may represent a source of pain. 3. Mild glenohumeral cartilage degenerative changes.   PATIENT SURVEYS:  FOTO: Perceived function   65%, predicted   68%    COGNITION: Overall cognitive status: Within functional limits for tasks assessed                                  SENSATION: WFL   POSTURE: Forward ead, rounded shoulders   UPPER EXTREMITY ROM:    Active ROM Right eval Left eval LT 04/22/23 LT 04/28/23 LT 05/04/23  Shoulder flexion 150 A62; P80 AA120 P125 P135  Shoulder extension         Shoulder abduction 154 A72; P85  P115 P120  Shoulder adduction         Shoulder internal rotation T4 A T1; P40  P 60 P65  Shoulder external rotation T7 A GT hip; P70  P 90 P90  Elbow flexion         Elbow extension         Wrist flexion         Wrist extension         Wrist ulnar deviation         Wrist radial deviation         Wrist pronation         Wrist supination         (Blank rows = not tested)   UPPER EXTREMITY MMT: L shoulder generally 2+ to 3_ MMT Right eval Left eval  Shoulder flexion 5    Shoulder extension 5    Shoulder abduction 5    Shoulder adduction 5    Shoulder internal rotation 5    Shoulder external rotation 5    Middle trapezius      Lower trapezius      Elbow flexion      Elbow extension       Wrist flexion      Wrist extension      Wrist ulnar deviation      Wrist radial deviation      Wrist pronation      Wrist supination      Grip strength (lbs)      (  Blank rows = not tested)   SHOULDER SPECIAL TESTS: Impingement tests:  NT SLAP lesions:  NT Instability tests:  NT Rotator cuff assessment:  NT Biceps assessment:  NT   PALPATION:  TTP peri-GH jt area             TODAY'S TREATMENT:        [facility] Adult PT Treatment:                                                DATE: 05/04/23 Therapeutic Exercise: Pulleys  Supine clasped hand pullovers , elbows flexed  Supine dowel chest press Supine dowel pullovers  Sidelying shoulder abduction AROM x 8 Sidelying shoulder ER AROM x8  Red band row 10 x 2  Standing shoulder wall slide flexion x 10  Manual Therapy: GHJ -inferior and A/P glides PROM flexion, abduction, IR, ER Modalities:  declined   [facility] Adult PT Treatment:                                                DATE: 04/28/23 Therapeutic Exercise: Pulleys  Counter walk back stretch for shoulder flexion Isometrics  flex, ext, abd, IR, ER x10 5" Seated table slides flexion, scaption, abduction Seated table ER PROM Supine dowel press  Supine dowel press to pullover  Manual Therapy: GHJ -inferior and A/P glides PROM flexion, abduction, IR, ER Modalities:  Ice pack left shoulder x 10 min    [facility] Adult PT Treatment:                                                DATE: 04/22/23 Therapeutic Exercise: Pulley for flexion and scaption 1' each Shoulder rows 2x10 RTB Shoulder isometrics flex, ext, abd, IR, ER x10 5" Pt AA ROM table top for flexion, abd, ER x10 each Manual Therapy: L shoulder  PA, Inf glide, distraction grades 2 and 3 L shoulder PROM all motions L scapular PROM all motions                                                                                                                           [facility] Adult PT Treatment:                                                 DATE: 04/20/23 Therapeutic Exercise: Developed, instructed in, and pt completed therex as noted in HEP  Modalities: Cold pack to the L shoulder  10 mins Self Care: Use of cold pack 10-15 mins as needed      PATIENT EDUCATION: Education details: Eval findings, POC, HEP, self care  Person educated: Patient Education method: Explanation, Demonstration, Tactile cues, Verbal cues, and Handouts Education comprehension: verbalized understanding, returned demonstration, verbal cues required, and tactile cues required   HOME EXERCISE PROGRAM: Access Code: [email or web address] URL: [email or web address] Date: 04/20/2023 Prepared by: Joellyn Rued   Exercises - Seated Shoulder Flexion Towel Slide at Table Top Full Range of Motion  - 3 x daily - 7 x weekly - 1 sets - 15 reps - 3 hold - Seated Shoulder Scaption Slide at Table Top with Forearm in Neutral  - 3 x daily - 7 x weekly - 3 sets - 10 reps - 3 hold - Seated Shoulder External Rotation PROM on Table  - 3 x daily - 7 x weekly - 3 sets - 10 reps - 3 hold   ASSESSMENT:   CLINICAL IMPRESSION: PT was completed for L GH jt and scapular ROM and gentle strengthening. Progressed with AAROM and AROM with good tolerance. Improved shoulder PROM OH. She declined modalities at end of session. Her HEP was updated. She has met STG# 1, # 2 and nearly met #3. Pt will continue to benefit from skilled PT to address impairments for improved function of the L shoulder c decreased pain   OBJECTIVE IMPAIRMENTS: decreased ROM, decreased strength, impaired UE functional use, obesity, and pain.    ACTIVITY LIMITATIONS: carrying, lifting, dressing, reach over head, and caring for others   PARTICIPATION LIMITATIONS: meal prep, cleaning, laundry, driving, shopping, and occupation   PERSONAL FACTORS: Fitness, Past/current experiences, Time since onset of injury/illness/exacerbation, and 1 comorbidity: igh BMI-heavy arm  are also affecting patient's functional  outcome.    REHAB POTENTIAL: Good   CLINICAL DECISION MAKING: Evolving/moderate complexity   EVALUATION COMPLEXITY: Moderate     GOALS:   SHORT TERM GOALS: Target date: 05/07/23   Pt will be Ind in an initial HEP Baseline: started Goal status: MET   2.  Pt will voice understanding of measures to assist in pain reduction  Baseline: started 05/04/23: able to voice  Goal status: MET   3.  Increase L shoulder PROM: flexion- 120, abd- 120d, ER- 60, IR 80d Baseline: see flow sheet 05/04/23: PROM: flexion- 120, abd- 120d, ER- 90, IR 65d Goal status: PARTIALLY MET   LONG TERM GOALS: Target date: 06/25/23   Pt will be Ind in a final HEP to maintain achieved LOF Baseline: started Goal status: INITIAL   2.  Increase L shoulder AROM to within 90% of the R for improved functional use of the L UE Baseline: see flow sheets Goal status: INITIAL   3.  Increase L shoulder strength to 4+/5 or greater for improved functional use of the L UE Baseline: 2+ to 3-/5 Goal status: INITIAL   4.  Pt will be able to lift 2lbs x10 reps above shoulder height to reflect demands consistent with her work Baseline: NT  Goal status: INITIAL   5.  Pt will be able to lift 10lbs x10 reps 2 handed form knee to chest height to reflect demands consistent with her work Baseline: NT Goal status: INITIAL     PLAN:   PT FREQUENCY: 2x/week   PT DURATION: 8 weeks   PLANNED INTERVENTIONS: Therapeutic exercises, Therapeutic activity, Patient/Family education, Self Care, Joint mobilization, Dry Needling, Cryotherapy, Moist heat, Taping, Vasopneumatic device, Traction, Ionotophoresis 4mg /ml Dexamethasone, Manual therapy, and Re-evaluation  PLAN FOR NEXT SESSION: Review FOTO; assess response to HEP; progress therex as indicated (isometric and periscapular strengthening); use of modalities, manual therapy; and TPDN as indicated. (S/P debridement Surgery 04/06/23)     Jannette Spanner, PTA 05/04/23 12:37 PM Phone:  [phone] Fax: [phone]

## 2023-05-05 NOTE — Therapy (Signed)
OUTPATIENT PHYSICAL THERAPY TREATMENT NOTE   Patient Name: Meredith York MRN: [id number] DOB:11-25-1976, 47 y.o., female Today's Date: 05/06/2023  PCP: [name], FNP   REFERRING PROVIDER: Jones Broom, MD   END OF SESSION:   PT End of Session - 05/06/23 0931     Visit Number 5    Number of Visits 17    Date for PT Re-Evaluation 06/25/23    Authorization Type [employer]; [place] MEDICA[employer] OF [place]    Authorization Time Period Request auth on 5th treatment date    Authorization - Visit Number 4    Authorization - Number of Visits 5    Progress Note Due on Visit 10    PT Start Time [phone]    PT Stop Time 0930    PT Time Calculation (min) 40 min    Activity Tolerance Patient tolerated treatment well    Behavior During Therapy [facility] for tasks assessed/performed              Past Medical History:  Diagnosis Date   Carpal tunnel syndrome on right    Chlamydia    Gestational diabetes mellitus    Hypertension    no medicaqtions at this time   Migraines    Ovarian cyst    S/P lumbar discectomy 04/10/2015   Tendonitis    right wrist   Trichomonas    Type 2 diabetes mellitus (HCC)    not currently taking medication   Past Surgical History:  Procedure Laterality Date   ABDOMINAL HYSTERECTOMY     BACK SURGERY     CESAREAN SECTION  12/27/2011   Procedure: CESAREAN SECTION;  Surgeon: Brock Bad, MD;  Location: [facility];  Service: Gynecology;  Laterality: N/A;  Primary cesarean section with delivery of baby girl at 43. Apgars 3/3/6   CESAREAN SECTION N/A 08/03/2016   Procedure: CESAREAN SECTION;  Surgeon: Tereso Newcomer, MD;  Location: [facility];  Service: Obstetrics;  Laterality: N/A;   CHOLECYSTECTOMY  02/2009   DILATION AND CURETTAGE OF UTERUS N/A 06/25/2014   Procedure: DILATATION AND CURETTAGE;  Surgeon: Reva Bores, MD;  Location: [facility];  Service: Gynecology;  Laterality: N/A;   LUMBAR LAMINECTOMY/DECOMPRESSION MICRODISCECTOMY Left  04/10/2015   Procedure: Left L5-S1 Microdiscectomy;  Surgeon: Eldred Manges, MD;  [facility];  Service: Orthopedics;  Laterality: Left;   VAGINAL HYSTERECTOMY N/A 06/04/2020   Procedure: HYSTERECTOMY VAGINAL, MORCELLATION;  Surgeon: Hermina Staggers, MD;  Location: [facility];  Service: Gynecology;  Laterality: N/A;   WISDOM TOOTH EXTRACTION     Patient Active Problem List   Diagnosis Date Noted   Rotator cuff tear, left 01/18/2023   OA (osteoarthritis) of shoulder 12/03/2022   Peroneal tendon tear, left, subsequent encounter 01/17/2021   Recurrent herniation of lumbar disc 10/30/2020   Prediabetes 09/24/2020   Post-operative state 06/04/2020   Visit for routine gyn exam 11/29/2019   Encounter for screening mammogram for breast cancer 11/29/2019   History of bilateral tubal ligation 11/29/2019   S/P cesarean section 07/14/2016   Hypertension 07/05/2011    REFERRING DIAG: s/p left shoulder cuff /debridment ,SAD DCE 04/06/2023   THERAPY DIAG:  Acute pain of left shoulder  Stiffness of left shoulder, not elsewhere classified  Muscle weakness (generalized)  Rationale for Evaluation and Treatment Rehabilitation  ONSET DATE: 4/16 surgery; Initial issue approx 2 years   SUBJECTIVE:  SUBJECTIVE STATEMENT: The shoulder is stiff in the AM, but it loosens up with gentle movements.   Hand dominance: Right   PAIN:  Are you having pain? Yes: NPRS scale: 5/10 Pain location: L GH area Pain description: ache, throb Aggravating factors: Certain movement Relieving factors: Rest, Ice packs  PERTINENT HISTORY: High BMI-heavy arm; HTN   PRECAUTIONS: None   WEIGHT BEARING RESTRICTIONS: No WBAT   FALLS:  Has patient fallen in last 6 months? 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 Partial-thickness midsubstance tear of the mid to anterior supraspinatus tendon footprint measuring up to 10 mm in AP dimension. 2. Moderate degenerative changes of the acromioclavicular joint. High-grade marrow edema within the clavicular head suggests this may represent a source of pain. 3. Mild glenohumeral cartilage degenerative changes.   PATIENT SURVEYS:  FOTO: Perceived function   65%, predicted   68%    COGNITION: Overall cognitive status: Within functional limits for tasks assessed                                  SENSATION: WFL   POSTURE: Forward ead, rounded shoulders   UPPER EXTREMITY ROM:    Active ROM Right eval Left eval LT 04/22/23 LT 04/28/23 LT 05/04/23 LT 05/06/23  Shoulder flexion 150 A62; P80 AA120 P125 P135   Shoulder extension          Shoulder abduction 154 A72; P85  P115 P120   Shoulder adduction          Shoulder internal rotation T4 A T1; P40  P 60 P65 P70  Shoulder external rotation T7 A GT hip; P70  P 90 P90   Elbow flexion          Elbow extension          Wrist flexion          Wrist extension          Wrist ulnar deviation          Wrist radial deviation          Wrist pronation          Wrist supination          (Blank rows = not tested)   UPPER EXTREMITY MMT: L shoulder generally 2+ to 3_ MMT Right eval Left eval  Shoulder flexion 5    Shoulder extension 5    Shoulder abduction 5    Shoulder adduction 5    Shoulder internal rotation 5     Shoulder external rotation 5    Middle trapezius      Lower trapezius      Elbow flexion      Elbow extension      Wrist flexion      Wrist extension      Wrist ulnar deviation      Wrist radial deviation      Wrist pronation      Wrist supination  Grip strength (lbs)      (Blank rows = not tested)   SHOULDER SPECIAL TESTS: Impingement tests:  NT SLAP lesions:  NT Instability tests:  NT Rotator cuff assessment:  NT Biceps assessment:  NT   PALPATION:  TTP peri-GH jt area             TODAY'S TREATMENT:   [facility] Adult PT Treatment:                                                DATE: 05/06/23 Therapeutic Exercise: Pulleys for flexion and scaption Isometrics  flex, ext, abd, IR, ER x10 5" Supine dowel chest press Supine dowel pullovers  Sidelying shoulder abduction AROM x 8 RTB shoulder row and ext 10 x 2 each S/L ER 2x10 Standing shoulder wall slide flexion x 10  Manual Therapy: GHJ -inferior and A/P glides PROM flexion, abduction, IR, ER       [facility] Adult PT Treatment:                                                DATE: 05/04/23 Therapeutic Exercise: Pulleys  Supine clasped hand pullovers , elbows flexed  Supine dowel chest press Supine dowel pullovers  Sidelying shoulder abduction AROM x 8 Sidelying shoulder ER AROM x8  Red band row 10 x 2  Standing shoulder wall slide flexion x 10  Manual Therapy: GHJ -inferior and A/P glides PROM flexion, abduction, IR, ER Modalities:  declined   [facility] Adult PT Treatment:                                                DATE: 04/28/23 Therapeutic Exercise: Pulleys  Counter walk back stretch for shoulder flexion Isometrics  flex, ext, abd, IR, ER x10 5" Seated table slides flexion, scaption, abduction Seated table ER PROM Supine dowel press  Supine dowel press to pullover  Manual Therapy: GHJ -inferior and A/P glides PROM flexion, abduction, IR, ER Modalities:  Ice pack left shoulder x 10 min  [facility] Adult PT  Treatment:                                                DATE: 04/22/23 Therapeutic Exercise: Pulley for flexion and scaption 1' each Shoulder rows 2x10 RTB Shoulder isometrics flex, ext, abd, IR, ER x10 5" Pt AA ROM table top for flexion, abd, ER x10 each Manual Therapy: L shoulder  PA, Inf glide, distraction grades 2 and 3 L shoulder PROM all motions L scapular PROM all motions  [facility] Adult PT Treatment:                                                DATE: 04/20/23 Therapeutic Exercise: Developed, instructed in, and pt completed therex as noted in HEP  Modalities: Cold pack to the L shoulder 10 mins Self Care: Use of cold pack 10-15 mins as needed      PATIENT EDUCATION: Education details: Eval findings, POC, HEP, self care  Person educated: Patient Education method: Explanation, Demonstration, Tactile cues, Verbal cues, and Handouts Education comprehension: verbalized understanding, returned demonstration, verbal cues required, and tactile cues required   HOME EXERCISE PROGRAM: Access Code: [email or web address] URL: [email or web address] Date: 04/20/2023 Prepared by: Joellyn Rued   Exercises - Seated Shoulder Flexion Towel Slide at Table Top Full Range of Motion  - 3 x daily - 7 x weekly - 1 sets - 15 reps - 3 hold - Seated Shoulder Scaption Slide at Table Top with Forearm in Neutral  - 3 x daily - 7 x weekly - 3 sets - 10 reps - 3 hold - Seated Shoulder External Rotation PROM on Table  - 3 x daily - 7 x weekly - 3 sets - 10 reps - 3 hold   ASSESSMENT:   CLINICAL IMPRESSION: PT was completed for L GH jt and scapular ROM and gentle progressive strengthening. Initiated ER and flexion strengthening against gravity. Pt tolerated prescribed therex without adverse effects. PROM for IR improved.  She declined modalities at end of session, stating she would  complete at home. Pt is making appropriate progress re: ROM, strength and function. Pt will continue to benefit from skilled PT to address impairments for improved function of the L shoulder c decreased pain   OBJECTIVE IMPAIRMENTS: decreased ROM, decreased strength, impaired UE functional use, obesity, and pain.    ACTIVITY LIMITATIONS: carrying, lifting, dressing, reach over head, and caring for others   PARTICIPATION LIMITATIONS: meal prep, cleaning, laundry, driving, shopping, and occupation   PERSONAL FACTORS: Fitness, Past/current experiences, Time since onset of injury/illness/exacerbation, and 1 comorbidity: igh BMI-heavy arm  are also affecting patient's functional outcome.    REHAB POTENTIAL: Good   CLINICAL DECISION MAKING: Evolving/moderate complexity   EVALUATION COMPLEXITY: Moderate     GOALS:   SHORT TERM GOALS: Target date: 05/07/23   Pt will be Ind in an initial HEP Baseline: started Goal status: MET   2.  Pt will voice understanding of measures to assist in pain reduction  Baseline: started 05/04/23: able to voice  Goal status: MET   3.  Increase L shoulder PROM: flexion- 120, abd- 120d, ER- 60, IR 80d Baseline: see flow sheet 05/04/23: PROM: flexion- 120, abd- 120d, ER- 90, IR 65d Goal status: PARTIALLY MET   LONG TERM GOALS: Target date: 06/25/23   Pt will be Ind in a final HEP to maintain achieved LOF Baseline: started Goal status: INITIAL   2.  Increase L shoulder AROM to within 90% of the R for improved functional use of the L UE Baseline: see flow sheets Goal status: INITIAL   3.  Increase L shoulder strength to 4+/5 or greater for improved functional use of the L UE Baseline: 2+ to 3-/5 Goal status: INITIAL   4.  Pt will be able to lift 2lbs x10 reps above shoulder height to reflect demands consistent with her work Baseline: NT  Goal status: INITIAL   5.  Pt will be able to lift 10lbs x10 reps 2 handed form knee to chest height to reflect  demands consistent with her work Baseline: NT Goal status: INITIAL     PLAN:   PT FREQUENCY: 2x/week   PT DURATION: 8 weeks   PLANNED INTERVENTIONS: Therapeutic exercises, Therapeutic activity, Patient/Family education, Self Care, Joint mobilization, Dry Needling, Cryotherapy, Moist heat, Taping, Vasopneumatic device, Traction, Ionotophoresis 4mg /ml Dexamethasone, Manual therapy, and Re-evaluation   PLAN FOR NEXT SESSION: Review FOTO; assess response to HEP; progress therex as indicated (isometric and periscapular strengthening); use of modalities, manual therapy; and TPDN as indicated. (S/P debridement Surgery 04/06/23)     Joellyn Rued MS, PT 05/06/23 9:33 AM

## 2023-05-06 ENCOUNTER — Ambulatory Visit: Payer: 59

## 2023-05-06 DIAGNOSIS — M25512 Pain in left shoulder: Secondary | ICD-10-CM | POA: Diagnosis not present

## 2023-05-06 DIAGNOSIS — M6281 Muscle weakness (generalized): Secondary | ICD-10-CM

## 2023-05-06 DIAGNOSIS — M25612 Stiffness of left shoulder, not elsewhere classified: Secondary | ICD-10-CM

## 2023-05-11 NOTE — Therapy (Signed)
OUTPATIENT PHYSICAL THERAPY TREATMENT NOTE   Patient Name: Meredith York MRN: [id number] DOB:1976/02/26, 47 y.o., female Today's Date: 05/12/2023  PCP: [name], FNP   REFERRING PROVIDER: Jones Broom, MD   END OF SESSION:   PT End of Session - 05/12/23 1418     Visit Number 6    Number of Visits 17    Date for PT Re-Evaluation 06/25/23    Authorization Type [employer]; [place] MEDICA[employer] OF     Authorization Time Period Request auth on 5th treatment date    Authorization - Visit Number 5    Authorization - Number of Visits 5    Progress Note Due on Visit 10    PT Start Time 1332    PT Stop Time 1420    PT Time Calculation (min) 48 min    Activity Tolerance Patient tolerated treatment well    Behavior During Therapy WFL for tasks assessed/performed              Past Medical History:  Diagnosis Date   Carpal tunnel syndrome on right    Chlamydia    Gestational diabetes mellitus    Hypertension    no medicaqtions at this time   Migraines    Ovarian cyst    S/P lumbar discectomy 04/10/2015   Tendonitis    right wrist   Trichomonas    Type 2 diabetes mellitus (HCC)    not currently taking medication   Past Surgical History:  Procedure Laterality Date   ABDOMINAL HYSTERECTOMY     BACK SURGERY     CESAREAN SECTION  12/27/2011   Procedure: CESAREAN SECTION;  Surgeon: Brock Bad, MD;  Location: [facility];  Service: Gynecology;  Laterality: N/A;  Primary cesarean section with delivery of baby girl at 70. Apgars 3/3/6   CESAREAN SECTION N/A 08/03/2016   Procedure: CESAREAN SECTION;  Surgeon: Tereso Newcomer, MD;  [facility]: [facility];  Service: Obstetrics;  Laterality: N/A;   CHOLECYSTECTOMY  02/2009   DILATION AND CURETTAGE OF UTERUS N/A 06/25/2014   Procedure: DILATATION AND CURETTAGE;  Surgeon: Reva Bores, MD;  Location: [facility];  Service: Gynecology;  Laterality: N/A;   LUMBAR LAMINECTOMY/DECOMPRESSION MICRODISCECTOMY Left  04/10/2015   Procedure: Left L5-S1 Microdiscectomy;  Surgeon: Eldred Manges, MD;  Location: [facility];  Service: Orthopedics;  Laterality: Left;   VAGINAL HYSTERECTOMY N/A 06/04/2020   Procedure: HYSTERECTOMY VAGINAL, MORCELLATION;  Surgeon: Hermina Staggers, MD;  Location: [facility];  Service: Gynecology;  Laterality: N/A;   WISDOM TOOTH EXTRACTION     Patient Active Problem List   Diagnosis Date Noted   Rotator cuff tear, left 01/18/2023   OA (osteoarthritis) of shoulder 12/03/2022   Peroneal tendon tear, left, subsequent encounter 01/17/2021   Recurrent herniation of lumbar disc 10/30/2020   Prediabetes 09/24/2020   Post-operative state 06/04/2020   Visit for routine gyn exam 11/29/2019   Encounter for screening mammogram for breast cancer 11/29/2019   History of bilateral tubal ligation 11/29/2019   S/P cesarean section 07/14/2016   Hypertension 07/05/2011    REFERRING DIAG: s/p left shoulder cuff /debridment ,SAD DCE 04/06/2023   THERAPY DIAG:  Acute pain of left shoulder  Muscle weakness (generalized)  Chronic left shoulder pain  Stiffness of left shoulder, not elsewhere classified  Rationale for Evaluation and Treatment Rehabilitation  ONSET DATE: 4/16 surgery; Initial issue approx 2 years   SUBJECTIVE:  SUBJECTIVE STATEMENT: Pt reports her L shoulder has been more consistently sore since the last PT session. Pt endorses using cold pack several times a day. Pt notes she has been released to return to work next week on light duty.  Hand dominance: Right   PAIN:  Are you having pain? Yes: NPRS scale: 6/10 Pain location: L GH area Pain description: ache, throb Aggravating factors: Certain movement Relieving factors: Rest, Ice packs  PERTINENT HISTORY: High BMI-heavy arm;  HTN   PRECAUTIONS: None   WEIGHT BEARING RESTRICTIONS: No WBAT   FALLS:  Has patient fallen in last 6 months? 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 Partial-thickness midsubstance tear of the mid to anterior supraspinatus tendon footprint measuring up to 10 mm in AP dimension. 2. Moderate degenerative changes of the acromioclavicular joint. High-grade marrow edema within the clavicular head suggests this may represent a source of pain. 3. Mild glenohumeral cartilage degenerative changes.   PATIENT SURVEYS:  FOTO: Perceived function   65%, predicted   68%    COGNITION: Overall cognitive status: Within functional limits for tasks assessed                                  SENSATION: WFL   POSTURE: Forward ead, rounded shoulders   UPPER EXTREMITY ROM:    Active ROM Right eval Left eval LT 04/22/23 LT 04/28/23 LT 05/04/23 LT 05/06/23  Shoulder flexion 150 A62; P80 AA120 P125 P135   Shoulder extension          Shoulder abduction 154 A72; P85  P115 P120   Shoulder adduction          Shoulder internal rotation T4 A T1; P40  P 60 P65 P70  Shoulder external rotation T7 A GT hip; P70  P 90 P90   Elbow flexion          Elbow extension          Wrist flexion          Wrist extension          Wrist ulnar deviation          Wrist radial deviation          Wrist pronation          Wrist supination          (Blank rows = not tested)   UPPER EXTREMITY MMT: L shoulder generally 2+ to  3_ MMT Right eval Left eval  Shoulder flexion 5    Shoulder extension 5    Shoulder abduction 5    Shoulder adduction 5    Shoulder internal rotation 5    Shoulder external rotation 5    Middle trapezius      Lower trapezius      Elbow flexion      Elbow extension      Wrist flexion      Wrist extension      Wrist  ulnar deviation      Wrist radial deviation      Wrist pronation      Wrist supination      Grip strength (lbs)      (Blank rows = not tested)   SHOULDER SPECIAL TESTS: Impingement tests:  NT SLAP lesions:  NT Instability tests:  NT Rotator cuff assessment:  NT Biceps assessment:  NT   PALPATION:  TTP peri-GH jt area             TODAY'S TREATMENT:  [facility] Adult PT Treatment:                                                DATE: 05/12/23 Therapeutic Exercise: Pulleys for flexion and scaption Shoulder flexion counter slides x10 Doorway shoulder ER stretch x5 10" Isometrics  flex, ext, abd, IR, ER x10 5" Supine dowel chest press x10 Supine dowel pullovers x10 RTB shoulder row 10 x 2 each Modalities Cold pack to the L sholder x 10 mins Self Care: Discussed decreasing ROM near end range of motions a little to minimize aggravation   [facility] Adult PT Treatment:                                                DATE: 05/06/23 Therapeutic Exercise: Pulleys for flexion and scaption Isometrics  flex, ext, abd, IR, ER x10 5" Supine dowel chest press Supine dowel pullovers  Sidelying shoulder abduction AROM x 8 RTB shoulder row and ext 10 x 2 each S/L ER 2x10 Standing shoulder wall slide flexion x 10  Manual Therapy: GHJ -inferior and A/P glides PROM flexion, abduction, IR, ER       [facility] Adult PT Treatment:                                                DATE: 05/04/23 Therapeutic Exercise: Pulleys  Supine clasped hand pullovers , elbows flexed  Supine dowel chest press Supine dowel pullovers  Sidelying shoulder abduction AROM x 8 Sidelying shoulder ER AROM x8   Red band row 10 x 2  Standing shoulder wall slide flexion x 10  Manual Therapy: GHJ -inferior and A/P glides PROM flexion, abduction, IR, ER  PATIENT EDUCATION: Education details: Eval findings, POC, HEP, self care  Person educated: Patient Education method: Explanation, Demonstration, Tactile cues, Verbal cues, and Handouts Education comprehension: verbalized understanding, returned demonstration, verbal cues required, and tactile cues required   HOME EXERCISE PROGRAM: Access Code: [email or web address] URL: [email or web address] Date: 04/20/2023 Prepared by: Joellyn Rued   Exercises - Seated Shoulder Flexion Towel Slide at Table Top Full Range of Motion  - 3 x daily - 7 x weekly - 1 sets - 15 reps - 3 hold - Seated Shoulder Scaption Slide at Table Top with Forearm in Neutral  - 3 x daily - 7 x weekly - 3 sets - 10 reps - 3 hold - Seated Shoulder External Rotation PROM on Table  - 3 x daily - 7 x weekly - 3 sets - 10 reps - 3 hold   ASSESSMENT:   CLINICAL IMPRESSION: Pt's  reports her L shoulder is bothering her more consistently since the last PT session. Discussed decreasing ROM near end range of motions a little to minimize aggravation. Also, L shoulder strengthening therex against gravity were placed on hold. Pt tolerated prescribed therex today without adverse effects. A cold pack was used at the end of session for symptom management. For approx 5 weeks following surgery, pt is making appropriate progress with L shoulder ROM and use. Will gradually progress ROM and strengthening as tolerated. Pt will continue to benefit from skilled PT to address impairments for improved L shoulder function with less pain.   OBJECTIVE IMPAIRMENTS: decreased ROM, decreased strength, impaired UE functional use, obesity, and pain.    ACTIVITY LIMITATIONS: carrying, lifting, dressing, reach over head, and caring for others   PARTICIPATION LIMITATIONS: meal prep, cleaning, laundry, driving,  shopping, and occupation   PERSONAL FACTORS: Fitness, Past/current experiences, Time since onset of injury/illness/exacerbation, and 1 comorbidity: igh BMI-heavy arm  are also affecting patient's functional outcome.    REHAB POTENTIAL: Good   CLINICAL DECISION MAKING: Evolving/moderate complexity   EVALUATION COMPLEXITY: Moderate     GOALS:   SHORT TERM GOALS: Target date: 05/07/23   Pt will be Ind in an initial HEP Baseline: started Goal status: MET   2.  Pt will voice understanding of measures to assist in pain reduction  Baseline: started 05/04/23: able to voice  Goal status: MET   3.  Increase L shoulder PROM: flexion- 120, abd- 120d, ER- 60, IR 80d Baseline: see flow sheet 05/04/23: PROM: flexion- 120, abd- 120d, ER- 90, IR 65d Goal status: PARTIALLY MET   LONG TERM GOALS: Target date: 06/25/23   Pt will be Ind in a final HEP to maintain achieved LOF Baseline: started Goal status: Ongoing   2.  Increase L shoulder AROM to within 90% of the R for improved functional use of the L UE Baseline: see flow sheets Goal status: Ongoing   3.  Increase L shoulder strength to 4+/5 or greater for improved functional use of the L UE Baseline: 2+ to 3-/5 Goal status: Ongoing   4.  Pt will be able to lift 2lbs x10 reps above shoulder height to reflect demands consistent with her work Baseline: NT  Goal status: Ongoing   5.  Pt will be able to lift 10lbs x10 reps 2 handed form knee to chest height to reflect demands consistent with her work Baseline: NT Goal status: Ongoing     PLAN:   PT FREQUENCY: 2x/week   PT DURATION: 8 weeks   PLANNED INTERVENTIONS: Therapeutic exercises, Therapeutic activity, Patient/Family education, Self Care, Joint mobilization, Dry Needling, Cryotherapy, Moist heat, Taping, Vasopneumatic device, Traction, Ionotophoresis 4mg /ml Dexamethasone, Manual therapy, and Re-evaluation   PLAN FOR NEXT SESSION: Review FOTO; assess response to HEP; progress  therex as indicated (isometric and periscapular strengthening); use of modalities, manual therapy; and TPDN as indicated. (S/P debridement Surgery 04/06/23)   Joellyn Rued MS, PT 05/12/23 2:43 PM  Check all possible CPT codes: 16109 - PT Re-evaluation, 97110- Therapeutic Exercise, [phone]- Neuro Re-education, [phone] - Gait Training, [phone] - Manual Therapy, 97530 - Therapeutic Activities, [phone] - Self Care, [phone] - Electrical stimulation (unattended), [id number] - Electrical stimulation (Manual), [id number] - Iontophoresis, [id number] - Ultrasound, [id number] - Vaso, and [id number] - Aquatic therapy    Check all conditions that are expected to impact treatment: {Conditions expected to impact treatment:Musculoskeletal disorders   If treatment provided at initial evaluation, no treatment charged due to lack of authorization.  Berle Fitz MS, PT 05/13/23 12:22 PM

## 2023-05-12 ENCOUNTER — Ambulatory Visit: Payer: 59

## 2023-05-12 DIAGNOSIS — G8929 Other chronic pain: Secondary | ICD-10-CM

## 2023-05-12 DIAGNOSIS — M25612 Stiffness of left shoulder, not elsewhere classified: Secondary | ICD-10-CM

## 2023-05-12 DIAGNOSIS — M25512 Pain in left shoulder: Secondary | ICD-10-CM

## 2023-05-12 DIAGNOSIS — M6281 Muscle weakness (generalized): Secondary | ICD-10-CM

## 2023-05-19 ENCOUNTER — Ambulatory Visit: Payer: 59

## 2023-05-19 DIAGNOSIS — M25512 Pain in left shoulder: Secondary | ICD-10-CM

## 2023-05-19 DIAGNOSIS — M6281 Muscle weakness (generalized): Secondary | ICD-10-CM

## 2023-05-19 DIAGNOSIS — G8929 Other chronic pain: Secondary | ICD-10-CM

## 2023-05-19 NOTE — Therapy (Signed)
OUTPATIENT PHYSICAL THERAPY TREATMENT NOTE   Patient Name: Meredith York MRN: [id number] DOB:1976/07/08, 47 y.o., female Today's Date: 05/20/2023  PCP: [name], FNP   REFERRING PROVIDER: Jones Broom, MD   END OF SESSION:   PT End of Session - 05/19/23 1544     Visit Number 7    Number of Visits 17    Date for PT Re-Evaluation 06/25/23    Authorization Type [employer]; [place] [employer] OF [place]    Authorization Time Period Request auth on 5th treatment date    Authorization - Visit Number 6    Authorization - Number of Visits 27    Progress Note Due on Visit 10    PT Start Time 1545    PT Stop Time 1630    PT Time Calculation (min) 45 min    Activity Tolerance Patient tolerated treatment well    Behavior During Therapy WFL for tasks assessed/performed               Past Medical History:  Diagnosis Date   Carpal tunnel syndrome on right    Chlamydia    Gestational diabetes mellitus    Hypertension    no medicaqtions at this time   Migraines    Ovarian cyst    S/P lumbar discectomy 04/10/2015   Tendonitis    right wrist   Trichomonas    Type 2 diabetes mellitus (HCC)    not currently taking medication   Past Surgical History:  Procedure Laterality Date   ABDOMINAL HYSTERECTOMY     BACK SURGERY     CESAREAN SECTION  12/27/2011   Procedure: CESAREAN SECTION;  Surgeon: Brock Bad, MD;  Location: [facility];  Service: Gynecology;  Laterality: N/A;  Primary cesarean section with delivery of baby girl at 23. Apgars 3/3/6   CESAREAN SECTION N/A 08/03/2016   Procedure: CESAREAN SECTION;  Surgeon: Tereso Newcomer, MD;  Location: [facility];  Service: Obstetrics;  Laterality: N/A;   CHOLECYSTECTOMY  02/2009   DILATION AND CURETTAGE OF UTERUS N/A 06/25/2014   Procedure: DILATATION AND CURETTAGE;  Surgeon: Reva Bores, MD;  Location: [facility];  Service: Gynecology;  Laterality: N/A;   LUMBAR LAMINECTOMY/DECOMPRESSION MICRODISCECTOMY Left  04/10/2015   Procedure: Left L5-S1 Microdiscectomy;  Surgeon: Eldred Manges, MD;  Location: [facility];  Service: Orthopedics;  Laterality: Left;   VAGINAL HYSTERECTOMY N/A 06/04/2020   Procedure: HYSTERECTOMY VAGINAL, MORCELLATION;  Surgeon: Hermina Staggers, MD;  Location: [facility];  Service: Gynecology;  Laterality: N/A;   WISDOM TOOTH EXTRACTION     Patient Active Problem List   Diagnosis Date Noted   Rotator cuff tear, left 01/18/2023   OA (osteoarthritis) of shoulder 12/03/2022   Peroneal tendon tear, left, subsequent encounter 01/17/2021   Recurrent herniation of lumbar disc 10/30/2020   Prediabetes 09/24/2020   Post-operative state 06/04/2020   Visit for routine gyn exam 11/29/2019   Encounter for screening mammogram for breast cancer 11/29/2019   History of bilateral tubal ligation 11/29/2019   S/P cesarean section 07/14/2016   Hypertension 07/05/2011    REFERRING DIAG: s/p left shoulder cuff /debridment ,SAD DCE 04/06/2023   THERAPY DIAG:  Acute pain of left shoulder  Muscle weakness (generalized)  Chronic left shoulder pain  Rationale for Evaluation and Treatment Rehabilitation  ONSET DATE: 4/16 surgery; Initial issue approx 2 years   SUBJECTIVE:  SUBJECTIVE STATEMENT: Pt reports her L shoulder has been sore with returning to work yesterday. She notes she is using cold packs to manage the increased soreness. Pt states she is staying away from lifting and pushing with her L UE.  Hand dominance: Right   PAIN:  Are you having pain? Yes: NPRS scale: 6/10 Pain location: L GH area Pain description: ache, throb Aggravating factors: Certain movement Relieving factors: Rest, Ice packs  PERTINENT HISTORY: High BMI-heavy arm; HTN   PRECAUTIONS: None   WEIGHT BEARING  RESTRICTIONS: No WBAT   FALLS:  Has patient fallen in last 6 months? 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 Partial-thickness midsubstance tear of the mid to anterior supraspinatus tendon footprint measuring up to 10 mm in AP dimension. 2. Moderate degenerative changes of the acromioclavicular joint. High-grade marrow edema within the clavicular head suggests this may represent a source of pain. 3. Mild glenohumeral cartilage degenerative changes.   PATIENT SURVEYS:  FOTO: Perceived function   65%, predicted   68%    COGNITION: Overall cognitive status: Within functional limits for tasks assessed                                  SENSATION: WFL   POSTURE: Forward ead, rounded shoulders   UPPER EXTREMITY ROM:    Active ROM Right eval Left eval LT 04/22/23 LT 04/28/23 LT 05/04/23 LT 05/06/23 LT 05/19/23  Shoulder flexion 150 A62; P80 AA120 P125 P135  A120 P150  Shoulder extension           Shoulder abduction 154 A72; P85  P115 P120    Shoulder adduction           Shoulder internal rotation T4 A T1; P40  P 60 P65 P70   Shoulder external rotation T7 A GT hip; P70  P 90 P90    Elbow flexion           Elbow extension           Wrist flexion           Wrist extension           Wrist ulnar deviation           Wrist radial deviation           Wrist pronation           Wrist supination           (Blank rows = not tested)   UPPER EXTREMITY MMT: L shoulder generally 2+ to 3_ MMT  Right eval Left eval  Shoulder flexion 5    Shoulder extension 5    Shoulder abduction 5    Shoulder adduction 5    Shoulder internal rotation 5    Shoulder external rotation 5    Middle trapezius      Lower trapezius      Elbow flexion      Elbow extension  Wrist flexion      Wrist extension      Wrist ulnar deviation      Wrist radial deviation      Wrist pronation      Wrist supination      Grip strength (lbs)      (Blank rows = not tested)   SHOULDER SPECIAL TESTS: Impingement tests:  NT SLAP lesions:  NT Instability tests:  NT Rotator cuff assessment:  NT Biceps assessment:  NT   PALPATION:  TTP peri-GH jt area             TODAY'S TREATMENT:  [facility] Adult PT Treatment:                                                DATE: 05/19/23 Therapeutic Exercise: Pulleys for flexion and scaption S/L L shoulder ER 1# 2x10 S/L L shoulder abd 2x10 Supine flexion 2x10 Supine serratus press 2x10 1# RTB shoulder row 10 x 2 each RTB shoulder ext 10 x 2 each  [facility] Adult PT Treatment:                                                DATE: 05/12/23 Therapeutic Exercise: Pulleys for flexion and scaption Shoulder flexion counter slides x10 Doorway shoulder ER stretch x5 10" Isometrics  flex, ext, abd, IR, ER x10 5" Supine dowel chest press x10 Supine dowel pullovers x10 RTB shoulder row 10 x 2 each Modalities Cold pack to the L sholder x 10 mins Self Care: Discussed decreasing ROM near end range of motions a little to minimize aggravation   [facility] Adult PT Treatment:                                                DATE: 05/06/23 Therapeutic Exercise: Pulleys for flexion and scaption Isometrics  flex, ext, abd, IR, ER x10 5" Supine dowel chest press Supine dowel pullovers  Sidelying shoulder abduction AROM x 8 RTB shoulder row and ext 10 x 2 each S/L ER 2x10 Standing shoulder wall slide flexion x 10  Manual Therapy: GHJ -inferior and A/P glides PROM flexion, abduction, IR,  ER  PATIENT EDUCATION: Education details: Eval findings, POC, HEP, self care  Person educated: Patient Education method: Explanation, Demonstration, Tactile cues, Verbal cues, and Handouts Education comprehension: verbalized understanding, returned demonstration, verbal cues required, and tactile cues required   HOME EXERCISE PROGRAM: Access Code: [email or web address] URL: [email or web address] Date: 05/19/2023 Prepared by: Joellyn Rued  Exercises - Seated Shoulder Flexion Towel Slide at Table Top Full Range of Motion  - 3 x daily - 7 x weekly - 1 sets - 15 reps - 3 hold - Seated Shoulder Scaption Slide at Table Top with Forearm in Neutral  - 3 x daily - 7 x weekly - 3 sets - 10 reps - 3 hold - Seated Shoulder External Rotation PROM on Table  - 3 x daily - 7 x weekly - 3 sets - 10 reps - 3 hold - Isometric Shoulder Flexion at Wall  - 1 x daily - 7 x weekly - 1 sets -  10 reps - 5 hold - Isometric Shoulder Extension at Wall  - 1 x daily - 7 x weekly - 1 sets - 10 reps - 5 hold - Standing Isometric Shoulder External Rotation with Doorway  - 1 x daily - 7 x weekly - 1 sets - 10 reps - 5 hold - Standing Isometric Shoulder Internal Rotation with Towel Roll at Doorway  - 1 x daily - 7 x weekly - 1 sets - 10 reps - 5 hold - Isometric Shoulder Abduction at Wall  - 1 x daily - 7 x weekly - 1 sets - 10 reps - 5 hold - Standing Shoulder Row with Anchored Resistance  - 1 x daily - 7 x weekly - 2 sets - 10 reps - 3 hold - Shoulder Extension with Resistance  - 1 x daily - 7 x weekly - 3 sets - 10 reps - 3 hold - Sidelying Shoulder External Rotation  - 1 x daily - 7 x weekly - 2 sets - 10 reps - Supine Shoulder Press  - 1 x daily - 7 x weekly - 2 sets - 10 reps - 3 hold - Sidelying Shoulder Abduction Palm Forward  - 1 x daily - 7 x weekly - 2 sets - 10 reps - 3 hold - Shoulder Flexion Wall Slide with Towel  - 1 x daily - 7 x weekly - 1-2 sets - 10 reps   ASSESSMENT:   CLINICAL IMPRESSION: PT was  completed for L shoulder ROM and for gradual progression with peri-scapular and light RC strengthening. Pt tolerated prescribed therex without adverse effects. Pt is making good progress with both PROM and AROM of the L shoulder. Pt will continue to benefit from skilled PT to address impairments for improved L shoulder function with less pain.   OBJECTIVE IMPAIRMENTS: decreased ROM, decreased strength, impaired UE functional use, obesity, and pain.    ACTIVITY LIMITATIONS: carrying, lifting, dressing, reach over head, and caring for others   PARTICIPATION LIMITATIONS: meal prep, cleaning, laundry, driving, shopping, and occupation   PERSONAL FACTORS: Fitness, Past/current experiences, Time since onset of injury/illness/exacerbation, and 1 comorbidity: igh BMI-heavy arm  are also affecting patient's functional outcome.    REHAB POTENTIAL: Good   CLINICAL DECISION MAKING: Evolving/moderate complexity   EVALUATION COMPLEXITY: Moderate     GOALS:   SHORT TERM GOALS: Target date: 05/07/23   Pt will be Ind in an initial HEP Baseline: started Goal status: MET   2.  Pt will voice understanding of measures to assist in pain reduction  Baseline: started 05/04/23: able to voice  Goal status: MET   3.  Increase L shoulder PROM: flexion- 120, abd- 120d, ER- 60, IR 80d Baseline: see flow sheet 05/04/23: PROM: flexion- 120, abd- 120d, ER- 90, IR 65d Goal status: PARTIALLY MET   LONG TERM GOALS: Target date: 06/25/23   Pt will be Ind in a final HEP to maintain achieved LOF Baseline: started Goal status: Ongoing   2.  Increase L shoulder AROM to within 90% of the R for improved functional use of the L UE Baseline: see flow sheets Goal status: Ongoing   3.  Increase L shoulder strength to 4+/5 or greater for improved functional use of the L UE Baseline: 2+ to 3-/5 Goal status: Ongoing   4.  Pt will be able to lift 2lbs x10 reps above shoulder height to reflect demands consistent with her  work Baseline: NT  Goal status: Ongoing   5.  Pt  will be able to lift 10lbs x10 reps 2 handed form knee to chest height to reflect demands consistent with her work Baseline: NT Goal status: Ongoing     PLAN:   PT FREQUENCY: 2x/week   PT DURATION: 8 weeks   PLANNED INTERVENTIONS: Therapeutic exercises, Therapeutic activity, Patient/Family education, Self Care, Joint mobilization, Dry Needling, Cryotherapy, Moist heat, Taping, Vasopneumatic device, Traction, Ionotophoresis 4mg /ml Dexamethasone, Manual therapy, and Re-evaluation   PLAN FOR NEXT SESSION: Review FOTO; assess response to HEP; progress therex as indicated (isometric and periscapular strengthening); use of modalities, manual therapy; and TPDN as indicated. (S/P debridement Surgery 04/06/23)   Joellyn Rued MS, PT 05/20/23 6:31 AM

## 2023-05-25 ENCOUNTER — Ambulatory Visit: Payer: 59 | Attending: Family Medicine

## 2023-05-25 DIAGNOSIS — M25512 Pain in left shoulder: Secondary | ICD-10-CM

## 2023-05-25 DIAGNOSIS — M25612 Stiffness of left shoulder, not elsewhere classified: Secondary | ICD-10-CM | POA: Diagnosis present

## 2023-05-25 DIAGNOSIS — M75112 Incomplete rotator cuff tear or rupture of left shoulder, not specified as traumatic: Secondary | ICD-10-CM | POA: Diagnosis present

## 2023-05-25 DIAGNOSIS — M6281 Muscle weakness (generalized): Secondary | ICD-10-CM | POA: Diagnosis present

## 2023-05-25 DIAGNOSIS — G8929 Other chronic pain: Secondary | ICD-10-CM | POA: Insufficient documentation

## 2023-05-25 NOTE — Therapy (Signed)
OUTPATIENT PHYSICAL THERAPY TREATMENT NOTE   Patient Name: Meredith York MRN: [id number] DOB:June 22, 1976, 46 y.o., female Today's Date: 05/25/2023  PCP: [name], FNP   REFERRING PROVIDER: Jones Broom, MD   END OF SESSION:   PT End of Session - 05/25/23 0853     Visit Number 8    Number of Visits 17    Date for PT Re-Evaluation 06/25/23    Authorization Type [employer]; [place] MEDICAID [employer] OF [place]    Authorization - Visit Number 7    Authorization - Number of Visits 27    Progress Note Due on Visit 10    PT Start Time [phone]    PT Stop Time 0940    PT Time Calculation (min) 51 min    Activity Tolerance Patient tolerated treatment well    Behavior During Therapy [facility] for tasks assessed/performed                Past Medical History:  Diagnosis Date   Carpal tunnel syndrome on right    Chlamydia    Gestational diabetes mellitus    Hypertension    no medicaqtions at this time   Migraines    Ovarian cyst    S/P lumbar discectomy 04/10/2015   Tendonitis    right wrist   Trichomonas    Type 2 diabetes mellitus (HCC)    not currently taking medication   Past Surgical History:  Procedure Laterality Date   ABDOMINAL HYSTERECTOMY     BACK SURGERY     CESAREAN SECTION  12/27/2011   Procedure: CESAREAN SECTION;  Surgeon: Brock Bad, MD;  Location: [facility];  Service: Gynecology;  Laterality: N/A;  Primary cesarean section with delivery of baby girl at 2. Apgars 3/3/6   CESAREAN SECTION N/A 08/03/2016   Procedure: CESAREAN SECTION;  Surgeon: Tereso Newcomer, MD;  Location: [facility];  Service: Obstetrics;  Laterality: N/A;   CHOLECYSTECTOMY  02/2009   DILATION AND CURETTAGE OF UTERUS N/A 06/25/2014   Procedure: DILATATION AND CURETTAGE;  Surgeon: Reva Bores, MD;  Location: [facility];  Service: Gynecology;  Laterality: N/A;   LUMBAR LAMINECTOMY/DECOMPRESSION MICRODISCECTOMY Left 04/10/2015   Procedure: Left L5-S1 Microdiscectomy;  Surgeon:  Eldred Manges, MD;  Location: [facility];  Service: Orthopedics;  Laterality: Left;   VAGINAL HYSTERECTOMY N/A 06/04/2020   Procedure: HYSTERECTOMY VAGINAL, MORCELLATION;  Surgeon: Hermina Staggers, MD;  Location: [facility];  Service: Gynecology;  Laterality: N/A;   WISDOM TOOTH EXTRACTION     Patient Active Problem List   Diagnosis Date Noted   Rotator cuff tear, left 01/18/2023   OA (osteoarthritis) of shoulder 12/03/2022   Peroneal tendon tear, left, subsequent encounter 01/17/2021   Recurrent herniation of lumbar disc 10/30/2020   Prediabetes 09/24/2020   Post-operative state 06/04/2020   Visit for routine gyn exam 11/29/2019   Encounter for screening mammogram for breast cancer 11/29/2019   History of bilateral tubal ligation 11/29/2019   S/P cesarean section 07/14/2016   Hypertension 07/05/2011    REFERRING DIAG: s/p left shoulder cuff /debridment ,SAD DCE 04/06/2023   THERAPY DIAG:  Acute pain of left shoulder  Muscle weakness (generalized)  Stiffness of left shoulder, not elsewhere classified  Rationale for Evaluation and Treatment Rehabilitation  ONSET DATE: 4/16 surgery; Initial issue approx 2 years   SUBJECTIVE:  SUBJECTIVE STATEMENT: Pt reports L shoulder soreness, but overall the pain is better.   Hand dominance: Right   PAIN:  Are you having pain? Yes: NPRS scale: 5/10 Pain location: L GH area Pain description: ache, throb Aggravating factors: Certain movement Relieving factors: Rest, Ice packs  PERTINENT HISTORY: High BMI-heavy arm; HTN   PRECAUTIONS: None   WEIGHT BEARING RESTRICTIONS: No WBAT   FALLS:  Has patient fallen in last 6 months? 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 Partial-thickness midsubstance tear of the mid to anterior supraspinatus tendon footprint measuring up to 10 mm in AP dimension. 2. Moderate degenerative changes of the acromioclavicular joint. High-grade marrow edema within the clavicular head suggests this may represent a source of pain. 3. Mild glenohumeral cartilage degenerative changes.   PATIENT SURVEYS:  FOTO: Perceived function   65%, predicted   68%. 05/25/23=56%   COGNITION: Overall cognitive status: Within functional limits for tasks assessed                                  SENSATION: WFL   POSTURE: Forward ead, rounded shoulders   UPPER EXTREMITY ROM:    Active ROM Right eval Left eval LT 04/22/23 LT 04/28/23 LT 05/04/23 LT 05/06/23 LT 05/19/23  Shoulder flexion 150 A62; P80 AA120 P125 P135  A120 P150  Shoulder extension           Shoulder abduction 154 A72; P85  P115 P120    Shoulder adduction           Shoulder internal rotation T4 A T1; P40  P 60 P65 P70   Shoulder external rotation T7 A GT hip; P70  P 90 P90    Elbow flexion           Elbow extension           Wrist flexion           Wrist extension           Wrist ulnar deviation           Wrist radial deviation           Wrist pronation           Wrist supination           (Blank rows = not tested)   UPPER EXTREMITY MMT: L shoulder generally 2+ to 3_ MMT Right eval Left eval  Shoulder flexion 5    Shoulder extension 5    Shoulder abduction 5    Shoulder adduction 5    Shoulder internal rotation 5    Shoulder external  rotation 5    Middle trapezius      Lower trapezius      Elbow flexion      Elbow extension      Wrist flexion      Wrist extension      Wrist ulnar deviation      Wrist radial deviation  Wrist pronation      Wrist supination      Grip strength (lbs)      (Blank rows = not tested)   SHOULDER SPECIAL TESTS: Impingement tests:  NT SLAP lesions:  NT Instability tests:  NT Rotator cuff assessment:  NT Biceps assessment:  NT   PALPATION:  TTP peri-GH jt area             TODAY'S TREATMENT:  [facility] Adult PT Treatment:                                                DATE: 05/25/23 Therapeutic Exercise: Pulleys for flexion and scaption S/L L shoulder ER 1# 2x10 S/L L shoulder abd 1# 2x10 Prone I's and Ts x10 Supine flexion x10 Supine serratus press 2x10 1# YTB shoulder ER 10x each YTB shoulder IR 10 x 2 each Modalities Cold pack to the L sholder x 10 mins  [facility] Adult PT Treatment:                                                DATE: 05/19/23 Therapeutic Exercise: Pulleys for flexion and scaption S/L L shoulder ER 1# 2x10 S/L L shoulder abd 2x10 Supine flexion 2x10 Supine serratus press 2x10 1# RTB shoulder row 10 x 2 each RTB shoulder ext 10 x 2 each  [facility] Adult PT Treatment:                                                DATE: 05/12/23 Therapeutic Exercise: Pulleys for flexion and scaption Shoulder flexion counter slides x10 Doorway shoulder ER stretch x5 10" Isometrics  flex, ext, abd, IR, ER x10 5" Supine dowel chest press x10 Supine dowel pullovers x10 RTB shoulder row 10 x 2 each Modalities Cold pack to the L sholder x 10 mins Self Care: Discussed decreasing ROM near end range of motions a little to minimize aggravation   PATIENT EDUCATION: Education details: Eval findings, POC, HEP, self care  Person educated: Patient Education method: Explanation, Demonstration, Tactile cues, Verbal cues, and Handouts Education comprehension: verbalized understanding,  returned demonstration, verbal cues required, and tactile cues required   HOME EXERCISE PROGRAM: Access Code: [email or web address] URL: [email or web address] Date: 05/19/2023 Prepared by: Joellyn Rued  Exercises - Seated Shoulder Flexion Towel Slide at Table Top Full Range of Motion  - 3 x daily - 7 x weekly - 1 sets - 15 reps - 3 hold - Seated Shoulder Scaption Slide at Table Top with Forearm in Neutral  - 3 x daily - 7 x weekly - 3 sets - 10 reps - 3 hold - Seated Shoulder External Rotation PROM on Table  - 3 x daily - 7 x weekly - 3 sets - 10 reps - 3 hold - Isometric Shoulder Flexion at Wall  - 1 x daily - 7 x weekly - 1 sets - 10 reps - 5 hold - Isometric Shoulder Extension at Wall  - 1 x daily - 7 x weekly - 1 sets - 10 reps - 5 hold - Standing Isometric Shoulder External Rotation with Doorway  -  1 x daily - 7 x weekly - 1 sets - 10 reps - 5 hold - Standing Isometric Shoulder Internal Rotation with Towel Roll at Doorway  - 1 x daily - 7 x weekly - 1 sets - 10 reps - 5 hold - Isometric Shoulder Abduction at Wall  - 1 x daily - 7 x weekly - 1 sets - 10 reps - 5 hold - Standing Shoulder Row with Anchored Resistance  - 1 x daily - 7 x weekly - 2 sets - 10 reps - 3 hold - Shoulder Extension with Resistance  - 1 x daily - 7 x weekly - 3 sets - 10 reps - 3 hold - Sidelying Shoulder External Rotation  - 1 x daily - 7 x weekly - 2 sets - 10 reps - Supine Shoulder Press  - 1 x daily - 7 x weekly - 2 sets - 10 reps - 3 hold - Sidelying Shoulder Abduction Palm Forward  - 1 x daily - 7 x weekly - 2 sets - 10 reps - 3 hold - Shoulder Flexion Wall Slide with Towel  - 1 x daily - 7 x weekly - 1-2 sets - 10 reps   ASSESSMENT:   CLINICAL IMPRESSION: Pt reports increased L shoulder discomfort c repetitions of each therex. FOTO was reassessed and found decreased, but new questions re: specific UE activities were part of today's FOTO. Pt reports improvement in pain and function. Pt was continued for  peri-scapular and light RC strengthening. A cold pack was applied at the end of the session for symptom management. Will assess L shoulder ROM the next PT session. Pt tolerated PT today without adverse effects.   OBJECTIVE IMPAIRMENTS: decreased ROM, decreased strength, impaired UE functional use, obesity, and pain.    ACTIVITY LIMITATIONS: carrying, lifting, dressing, reach over head, and caring for others   PARTICIPATION LIMITATIONS: meal prep, cleaning, laundry, driving, shopping, and occupation   PERSONAL FACTORS: Fitness, Past/current experiences, Time since onset of injury/illness/exacerbation, and 1 comorbidity: igh BMI-heavy arm  are also affecting patient's functional outcome.    REHAB POTENTIAL: Good   CLINICAL DECISION MAKING: Evolving/moderate complexity   EVALUATION COMPLEXITY: Moderate     GOALS:   SHORT TERM GOALS: Target date: 05/07/23   Pt will be Ind in an initial HEP Baseline: started Goal status: MET   2.  Pt will voice understanding of measures to assist in pain reduction  Baseline: started 05/04/23: able to voice  Goal status: MET   3.  Increase L shoulder PROM: flexion- 120, abd- 120d, ER- 60, IR 80d Baseline: see flow sheet 05/04/23: PROM: flexion- 120, abd- 120d, ER- 90, IR 65d Goal status: PARTIALLY MET   LONG TERM GOALS: Target date: 06/25/23   Pt will be Ind in a final HEP to maintain achieved LOF Baseline: started Goal status: Ongoing   2.  Increase L shoulder AROM to within 90% of the R for improved functional use of the L UE Baseline: see flow sheets Goal status: Ongoing   3.  Increase L shoulder strength to 4+/5 or greater for improved functional use of the L UE Baseline: 2+ to 3-/5 Goal status: Ongoing   4.  Pt will be able to lift 2lbs x10 reps above shoulder height to reflect demands consistent with her work Baseline: NT  Goal status: Ongoing   5.  Pt will be able to lift 10lbs x10 reps 2 handed form knee to chest height to reflect  demands consistent with her  work Baseline: NT Goal status: Ongoing     PLAN:   PT FREQUENCY: 2x/week   PT DURATION: 8 weeks   PLANNED INTERVENTIONS: Therapeutic exercises, Therapeutic activity, Patient/Family education, Self Care, Joint mobilization, Dry Needling, Cryotherapy, Moist heat, Taping, Vasopneumatic device, Traction, Ionotophoresis 4mg /ml Dexamethasone, Manual therapy, and Re-evaluation   PLAN FOR NEXT SESSION: Review FOTO; assess response to HEP; progress therex as indicated (isometric and periscapular strengthening); use of modalities, manual therapy; and TPDN as indicated. (S/P debridement Surgery 04/06/23)   Joellyn Rued MS, PT 05/25/23 10:52 AM

## 2023-05-27 ENCOUNTER — Encounter: Payer: Self-pay | Admitting: Physical Therapy

## 2023-05-27 ENCOUNTER — Ambulatory Visit: Payer: 59 | Admitting: Physical Therapy

## 2023-05-27 DIAGNOSIS — M25512 Pain in left shoulder: Secondary | ICD-10-CM | POA: Diagnosis not present

## 2023-05-27 DIAGNOSIS — M6281 Muscle weakness (generalized): Secondary | ICD-10-CM

## 2023-05-27 NOTE — Therapy (Signed)
OUTPATIENT PHYSICAL THERAPY TREATMENT NOTE   Patient Name: Meredith York MRN: [id number] DOB:January 05, 1976, 47 y.o., female Today's Date: 05/27/2023  PCP: [name], FNP   REFERRING PROVIDER: Jones Broom, MD   END OF SESSION:   PT End of Session - 05/27/23 1019     Visit Number 9    Number of Visits 17    Date for PT Re-Evaluation 06/25/23    Authorization Type [employer]; [place] [employer] [place]    Authorization Time Period Request auth on 5th treatment date    Authorization - Visit Number 3    Authorization - Number of Visits 8    PT Start Time 1018    PT Stop Time 1102    PT Time Calculation (min) 44 min                Past Medical History:  Diagnosis Date   Carpal tunnel syndrome on right    Chlamydia    Gestational diabetes mellitus    Hypertension    no medicaqtions at this time   Migraines    Ovarian cyst    S/P lumbar discectomy 04/10/2015   Tendonitis    right wrist   Trichomonas    Type 2 diabetes mellitus (HCC)    not currently taking medication   Past Surgical History:  Procedure Laterality Date   ABDOMINAL HYSTERECTOMY     BACK SURGERY     CESAREAN SECTION  12/27/2011   Procedure: CESAREAN SECTION;  Surgeon: Brock Bad, MD;  Location: [facility];  Service: Gynecology;  Laterality: N/A;  Primary cesarean section with delivery of baby girl at 29. Apgars 3/3/6   CESAREAN SECTION N/A 08/03/2016   Procedure: CESAREAN SECTION;  Surgeon: Tereso Newcomer, MD;  Location: [facility];  Service: Obstetrics;  Laterality: N/A;   CHOLECYSTECTOMY  02/2009   DILATION AND CURETTAGE OF UTERUS N/A 06/25/2014   Procedure: DILATATION AND CURETTAGE;  Surgeon: Reva Bores, MD;  Location: [facility];  Service: Gynecology;  Laterality: N/A;   LUMBAR LAMINECTOMY/DECOMPRESSION MICRODISCECTOMY Left 04/10/2015   Procedure: Left L5-S1 Microdiscectomy;  Surgeon: Eldred Manges, MD;  Location: [facility];  Service: Orthopedics;  Laterality: Left;   VAGINAL  HYSTERECTOMY N/A 06/04/2020   Procedure: HYSTERECTOMY VAGINAL, MORCELLATION;  Surgeon: Hermina Staggers, MD;  Location: [facility];  Service: Gynecology;  Laterality: N/A;   WISDOM TOOTH EXTRACTION     Patient Active Problem List   Diagnosis Date Noted   Rotator cuff tear, left 01/18/2023   OA (osteoarthritis) of shoulder 12/03/2022   Peroneal tendon tear, left, subsequent encounter 01/17/2021   Recurrent herniation of lumbar disc 10/30/2020   Prediabetes 09/24/2020   Post-operative state 06/04/2020   Visit for routine gyn exam 11/29/2019   Encounter for screening mammogram for breast cancer 11/29/2019   History of bilateral tubal ligation 11/29/2019   S/P cesarean section 07/14/2016   Hypertension 07/05/2011    REFERRING DIAG: s/p left shoulder cuff /debridment ,SAD DCE 04/06/2023   THERAPY DIAG:  Acute pain of left shoulder  Muscle weakness (generalized)  Rationale for Evaluation and Treatment Rehabilitation  ONSET DATE: 4/16 surgery; Initial issue approx 2 years   SUBJECTIVE:  SUBJECTIVE STATEMENT: Pt reports L shoulder soreness, but overall the pain is better.   Hand dominance: Right   PAIN:  Are you having pain? Yes: NPRS scale: 5/10 Pain location: L GH area Pain description: ache, throb Aggravating factors: Certain movement Relieving factors: Rest, Ice packs  PERTINENT HISTORY: High BMI-heavy arm; HTN   PRECAUTIONS: None   WEIGHT BEARING RESTRICTIONS: No WBAT   FALLS:  Has patient fallen in last 6 months? 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 Partial-thickness midsubstance tear of the mid to anterior supraspinatus tendon footprint measuring up to 10 mm in AP dimension. 2. Moderate degenerative changes of the acromioclavicular joint. High-grade marrow edema within the clavicular head suggests this may represent a source of pain. 3. Mild glenohumeral cartilage degenerative changes.   PATIENT SURVEYS:  FOTO: Perceived function   65%, predicted   68%. 05/25/23=56%   COGNITION: Overall cognitive status: Within functional limits for tasks assessed                                  SENSATION: WFL   POSTURE: Forward ead, rounded shoulders   UPPER EXTREMITY ROM:    Active ROM Right eval Left eval LT 04/22/23 LT 04/28/23 LT 05/04/23 LT 05/06/23 LT 05/19/23   Shoulder flexion 150 A62; P80 AA120 P125 P135  A120 P150 135  Shoulder extension            Shoulder abduction 154 A72; P85  P115 P120   125  Shoulder adduction            Shoulder internal rotation T4 A T1; P40  P 60 P65 P70  Left PSIS  Shoulder external rotation T7 A GT hip; P70  P 90 P90   Reaches back of neck  Elbow flexion            Elbow extension            Wrist flexion            Wrist extension            Wrist ulnar deviation            Wrist radial deviation            Wrist pronation            Wrist supination            (Blank rows = not tested)   UPPER EXTREMITY MMT: L shoulder generally 2+ to 3_ MMT Right eval Left eval  Shoulder flexion 5    Shoulder extension 5    Shoulder abduction 5    Shoulder adduction 5    Shoulder internal rotation 5    Shoulder external rotation 5    Middle trapezius      Lower trapezius      Elbow flexion      Elbow extension       Wrist flexion  Wrist extension      Wrist ulnar deviation      Wrist radial deviation      Wrist pronation      Wrist supination      Grip strength (lbs)      (Blank rows = not tested)   SHOULDER SPECIAL TESTS: Impingement tests:  NT SLAP lesions:  NT Instability tests:  NT Rotator cuff assessment:  NT Biceps assessment:  NT   PALPATION:  TTP peri-GH jt area             TODAY'S TREATMENT:  [facility] Adult PT Treatment:                                                DATE: 05/27/23 Therapeutic Exercise: Pulleys  Row  10 x 2 red Shoulder ext 10 x 2 red Left IR YTB 10 x 2  Left ER YTB 10 x 2  Prone I and T 2 x 10 each  S/L abduction 1# x 10, AROM x 10  S/L 1# ER 2 x 10  Supine flexion 10 x 2  Supine serratus press 2x10 1# IR AAROm using opp UE to assist 10 sec x 2  Modalities Cold pack to the L sholder x 10 mins    [facility] Adult PT Treatment:                                                DATE: 05/25/23 Therapeutic Exercise: Pulleys for flexion and scaption S/L L shoulder ER 1# 2x10 S/L L shoulder abd 1# 2x10 Prone I's and Ts x10 Supine flexion x10 Supine serratus press 2x10 1# YTB shoulder ER 10x each YTB shoulder IR 10 x 2 each Modalities Cold pack to the L sholder x 10 mins  [facility] Adult PT Treatment:                                                DATE: 05/19/23 Therapeutic Exercise: Pulleys for flexion and scaption S/L L shoulder ER 1# 2x10 S/L L shoulder abd 2x10 Supine flexion 2x10 Supine serratus press 2x10 1# RTB shoulder row 10 x 2 each RTB shoulder ext 10 x 2 each  [facility] Adult PT Treatment:                                                DATE: 05/12/23 Therapeutic Exercise: Pulleys for flexion and scaption Shoulder flexion counter slides x10 Doorway shoulder ER stretch x5 10" Isometrics  flex, ext, abd, IR, ER x10 5" Supine dowel chest press x10 Supine dowel pullovers x10 RTB shoulder row 10 x 2 each Modalities Cold pack to the L sholder x 10  mins Self Care: Discussed decreasing ROM near end range of motions a little to minimize aggravation   PATIENT EDUCATION: Education details: Eval findings, POC, HEP, self care  Person educated: Patient Education method: Explanation, Demonstration, Tactile cues, Verbal cues, and Handouts Education comprehension: verbalized understanding, returned demonstration, verbal  cues required, and tactile cues required   HOME EXERCISE PROGRAM: Access Code: [email or web address] URL: [email or web address] Date: 05/19/2023 Prepared by: Joellyn Rued  Exercises - Seated Shoulder Flexion Towel Slide at Table Top Full Range of Motion  - 3 x daily - 7 x weekly - 1 sets - 15 reps - 3 hold - Seated Shoulder Scaption Slide at Table Top with Forearm in Neutral  - 3 x daily - 7 x weekly - 3 sets - 10 reps - 3 hold - Seated Shoulder External Rotation PROM on Table  - 3 x daily - 7 x weekly - 3 sets - 10 reps - 3 hold - Isometric Shoulder Flexion at Wall  - 1 x daily - 7 x weekly - 1 sets - 10 reps - 5 hold - Isometric Shoulder Extension at Wall  - 1 x daily - 7 x weekly - 1 sets - 10 reps - 5 hold - Standing Isometric Shoulder External Rotation with Doorway  - 1 x daily - 7 x weekly - 1 sets - 10 reps - 5 hold - Standing Isometric Shoulder Internal Rotation with Towel Roll at Doorway  - 1 x daily - 7 x weekly - 1 sets - 10 reps - 5 hold - Isometric Shoulder Abduction at Wall  - 1 x daily - 7 x weekly - 1 sets - 10 reps - 5 hold - Standing Shoulder Row with Anchored Resistance  - 1 x daily - 7 x weekly - 2 sets - 10 reps - 3 hold - Shoulder Extension with Resistance  - 1 x daily - 7 x weekly - 3 sets - 10 reps - 3 hold - Sidelying Shoulder External Rotation  - 1 x daily - 7 x weekly - 2 sets - 10 reps - Supine Shoulder Press  - 1 x daily - 7 x weekly - 2 sets - 10 reps - 3 hold - Sidelying Shoulder Abduction Palm Forward  - 1 x daily - 7 x weekly - 2 sets - 10 reps - 3 hold - Shoulder Flexion Wall Slide with  Towel  - 1 x daily - 7 x weekly - 1-2 sets - 10 reps   ASSESSMENT:   CLINICAL IMPRESSION: Pt reports increased L shoulder discomfort c repetitions of each therex but able to complete presribed reps and sets. .Pt was continued for peri-scapular and light RC strengthening. A cold pack was applied at the end of the session for symptom management. L shoulder ROM has improved. Most limited in IR.  Pt tolerated PT today without adverse effects.   OBJECTIVE IMPAIRMENTS: decreased ROM, decreased strength, impaired UE functional use, obesity, and pain.    ACTIVITY LIMITATIONS: carrying, lifting, dressing, reach over head, and caring for others   PARTICIPATION LIMITATIONS: meal prep, cleaning, laundry, driving, shopping, and occupation   PERSONAL FACTORS: Fitness, Past/current experiences, Time since onset of injury/illness/exacerbation, and 1 comorbidity: igh BMI-heavy arm  are also affecting patient's functional outcome.    REHAB POTENTIAL: Good   CLINICAL DECISION MAKING: Evolving/moderate complexity   EVALUATION COMPLEXITY: Moderate     GOALS:   SHORT TERM GOALS: Target date: 05/07/23   Pt will be Ind in an initial HEP Baseline: started Goal status: MET   2.  Pt will voice understanding of measures to assist in pain reduction  Baseline: started 05/04/23: able to voice  Goal status: MET   3.  Increase L shoulder PROM: flexion- 120, abd- 120d, ER- 60, IR 80d Baseline: see flow sheet  05/04/23: PROM: flexion- 120, abd- 120d, ER- 90, IR 65d Goal status: PARTIALLY MET   LONG TERM GOALS: Target date: 06/25/23   Pt will be Ind in a final HEP to maintain achieved LOF Baseline: started Goal status: Ongoing   2.  Increase L shoulder AROM to within 90% of the R for improved functional use of the L UE Baseline: see flow sheets Goal status: Ongoing   3.  Increase L shoulder strength to 4+/5 or greater for improved functional use of the L UE Baseline: 2+ to 3-/5 Goal status: Ongoing   4.   Pt will be able to lift 2lbs x10 reps above shoulder height to reflect demands consistent with her work Baseline: NT  Goal status: Ongoing   5.  Pt will be able to lift 10lbs x10 reps 2 handed form knee to chest height to reflect demands consistent with her work Baseline: NT Goal status: Ongoing     PLAN:   PT FREQUENCY: 2x/week   PT DURATION: 8 weeks   PLANNED INTERVENTIONS: Therapeutic exercises, Therapeutic activity, Patient/Family education, Self Care, Joint mobilization, Dry Needling, Cryotherapy, Moist heat, Taping, Vasopneumatic device, Traction, Ionotophoresis 4mg /ml Dexamethasone, Manual therapy, and Re-evaluation   PLAN FOR NEXT SESSION: Review FOTO; assess response to HEP; progress therex as indicated (isometric and periscapular strengthening); use of modalities, manual therapy; and TPDN as indicated. (S/P debridement Surgery 04/06/23)   Jannette Spanner, PTA 05/27/23 11:39 AM Phone: [phone] Fax: [phone]

## 2023-05-31 ENCOUNTER — Emergency Department (HOSPITAL_BASED_OUTPATIENT_CLINIC_OR_DEPARTMENT_OTHER)
Admission: EM | Admit: 2023-05-31 | Discharge: 2023-05-31 | Disposition: A | Discharge status: Home / Self Care | Payer: 59 | Attending: Emergency Medicine | Admitting: Emergency Medicine

## 2023-05-31 ENCOUNTER — Other Ambulatory Visit (HOSPITAL_BASED_OUTPATIENT_CLINIC_OR_DEPARTMENT_OTHER): Payer: Self-pay

## 2023-05-31 ENCOUNTER — Encounter (HOSPITAL_BASED_OUTPATIENT_CLINIC_OR_DEPARTMENT_OTHER): Payer: Self-pay | Admitting: Emergency Medicine

## 2023-05-31 ENCOUNTER — Other Ambulatory Visit (HOSPITAL_COMMUNITY)
Admission: RE | Admit: 2023-05-31 | Discharge: 2023-05-31 | Disposition: A | Discharge status: Home / Self Care | Payer: 59 | Source: Ambulatory Visit | Attending: Emergency Medicine | Admitting: Emergency Medicine

## 2023-05-31 ENCOUNTER — Other Ambulatory Visit: Payer: Self-pay

## 2023-05-31 DIAGNOSIS — R1030 Lower abdominal pain, unspecified: Secondary | ICD-10-CM | POA: Diagnosis not present

## 2023-05-31 DIAGNOSIS — R300 Dysuria: Secondary | ICD-10-CM | POA: Insufficient documentation

## 2023-05-31 DIAGNOSIS — I10 Essential (primary) hypertension: Secondary | ICD-10-CM | POA: Insufficient documentation

## 2023-05-31 DIAGNOSIS — E119 Type 2 diabetes mellitus without complications: Secondary | ICD-10-CM | POA: Diagnosis not present

## 2023-05-31 DIAGNOSIS — M545 Low back pain, unspecified: Secondary | ICD-10-CM | POA: Diagnosis present

## 2023-05-31 DIAGNOSIS — Z79899 Other long term (current) drug therapy: Secondary | ICD-10-CM | POA: Diagnosis not present

## 2023-05-31 LAB — CBC WITH DIFFERENTIAL/PLATELET
Abs Immature Granulocytes: 0.1 10*3/uL — ABNORMAL HIGH (ref 0.00–0.07)
Basophils Absolute: 0.1 10*3/uL (ref 0.0–0.1)
Basophils Relative: 1 %
Eosinophils Absolute: 0.2 10*3/uL (ref 0.0–0.5)
Eosinophils Relative: 2 %
HCT: 41.3 % (ref 36.0–46.0)
Hemoglobin: 13.3 g/dL (ref 12.0–15.0)
Immature Granulocytes: 1 %
Lymphocytes Relative: 31 %
Lymphs Abs: 2.4 10*3/uL (ref 0.7–4.0)
MCH: 29.7 pg (ref 26.0–34.0)
MCHC: 32.2 g/dL (ref 30.0–36.0)
MCV: 92.2 fL (ref 80.0–100.0)
Monocytes Absolute: 0.6 10*3/uL (ref 0.1–1.0)
Monocytes Relative: 8 %
Neutro Abs: 4.4 10*3/uL (ref 1.7–7.7)
Neutrophils Relative %: 57 %
Platelets: 286 10*3/uL (ref 150–400)
RBC: 4.48 MIL/uL (ref 3.87–5.11)
RDW: 13.5 % (ref 11.5–15.5)
WBC: 7.8 10*3/uL (ref 4.0–10.5)
nRBC: 0 % (ref 0.0–0.2)

## 2023-05-31 LAB — COMPREHENSIVE METABOLIC PANEL
ALT: 21 U/L (ref 0–44)
AST: 21 U/L (ref 15–41)
Albumin: 3.7 g/dL (ref 3.5–5.0)
Alkaline Phosphatase: 79 U/L (ref 38–126)
Anion gap: 10 (ref 5–15)
BUN: 12 mg/dL (ref 6–20)
CO2: 22 mmol/L (ref 22–32)
Calcium: 8.7 mg/dL — ABNORMAL LOW (ref 8.9–10.3)
Chloride: 102 mmol/L (ref 98–111)
Creatinine, Ser: 0.74 mg/dL (ref 0.44–1.00)
GFR, Estimated: >60 mL/min (ref >60)
Glucose, Bld: 99 mg/dL (ref 70–99)
Potassium: 4.2 mmol/L (ref 3.5–5.1)
Sodium: 134 mmol/L — ABNORMAL LOW (ref 135–145)
Total Bilirubin: 0.8 mg/dL (ref 0.3–1.2)
Total Protein: 7.4 g/dL (ref 6.5–8.1)

## 2023-05-31 LAB — URINALYSIS, MICROSCOPIC (REFLEX): Squamous Epithelial / HPF: NONE SEEN /HPF (ref 0–5)

## 2023-05-31 LAB — URINALYSIS, ROUTINE W REFLEX MICROSCOPIC
Bilirubin Urine: NEGATIVE
Glucose, UA: NEGATIVE mg/dL
Ketones, ur: NEGATIVE mg/dL
Leukocytes,Ua: NEGATIVE
Nitrite: NEGATIVE
Protein, ur: NEGATIVE mg/dL
Specific Gravity, Urine: 1.02 (ref 1.005–1.030)
pH: 6 (ref 5.0–8.0)

## 2023-05-31 LAB — CK: Total CK: 74 U/L (ref 38–234)

## 2023-05-31 MED ORDER — AMLODIPINE BESYLATE 5 MG PO TABS
5.0000 mg | ORAL_TABLET | Freq: Every day | ORAL | 1 refills | Status: DC
  Filled 2023-05-31: qty 90, 90d supply, fill #0

## 2023-05-31 NOTE — ED Triage Notes (Signed)
Patient presents to ED via POV from home. Here with bilateral flank pain and dark urine. Denies dysuria.

## 2023-05-31 NOTE — ED Provider Notes (Signed)
[facility] Provider Note   CSN: [id number] Arrival date & time: 05/31/23  7829     History {Add pertinent medical, surgical, social history, OB history to HPI:1} Chief Complaint  Patient presents with   Flank Pain    Jezebel Schreifels is a 47 y.o. female.  HPI     Wednesday was having pain lower back, this morning was more middle and shooting over to the right side Urine had an odor More frequent urination No vaginal discharge, no hematuria Home UTI test positive Back pain from middle and shoots to the right side Some burning with urination just started Nausea, no vomiting or diarrhea No cough, dyspnea or sore throat This time last year had UTI No fevers, numbness or weakness, loss of control of bowel or bladder Mild pain lower abdomen, and pain in back 5/10    Past Medical History:  Diagnosis Date   Carpal tunnel syndrome on right    Chlamydia    Gestational diabetes mellitus    Hypertension    no medicaqtions at this time   Migraines    Ovarian cyst    S/P lumbar discectomy 04/10/2015   Tendonitis    right wrist   Trichomonas    Type 2 diabetes mellitus (HCC)    not currently taking medication    Past Surgical History:  Procedure Laterality Date   ABDOMINAL HYSTERECTOMY     BACK SURGERY     CESAREAN SECTION  12/27/2011   Procedure: CESAREAN SECTION;  Surgeon: Brock Bad, MD;  Location: [facility];  Service: Gynecology;  Laterality: N/A;  Primary cesarean section with delivery of baby girl at 88. Apgars 3/3/6   CESAREAN SECTION N/A 08/03/2016   Procedure: CESAREAN SECTION;  Surgeon: Tereso Newcomer, MD;  Location: [facility];  Service: Obstetrics;  Laterality: N/A;   CHOLECYSTECTOMY  02/2009   DILATION AND CURETTAGE OF UTERUS N/A 06/25/2014   Procedure: DILATATION AND CURETTAGE;  Surgeon: Reva Bores, MD;  Location: [facility];  Service: Gynecology;  Laterality: N/A;   LUMBAR LAMINECTOMY/DECOMPRESSION  MICRODISCECTOMY Left 04/10/2015   Procedure: Left L5-S1 Microdiscectomy;  Surgeon: Eldred Manges, MD;  Location: [facility];  Service: Orthopedics;  Laterality: Left;   VAGINAL HYSTERECTOMY N/A 06/04/2020   Procedure: HYSTERECTOMY VAGINAL, MORCELLATION;  Surgeon: Hermina Staggers, MD;  Location: [facility];  Service: Gynecology;  Laterality: N/A;   WISDOM TOOTH EXTRACTION      Home Medications Prior to Admission medications   Medication Sig Start Date End Date Taking? Authorizing Provider  amLODipine (NORVASC) 5 MG tablet Take 1 tablet (5 mg total) by mouth daily. 03/26/21 03/26/22  [name], FNP  celecoxib (CELEBREX) 200 MG capsule Take 1 capsule (200 mg total) by mouth 2 (two) times daily as needed. One to 2 tablets by mouth daily as needed for pain. 12/28/22   Myra Rude, MD  hydrochlorothiazide (HYDRODIURIL) 25 MG tablet Take 1 tablet (25 mg total) by mouth daily. 09/24/20   [name], FNP  ibuprofen (ADVIL) 800 MG tablet Take 1 tablet (800 mg total) by mouth 3 (three) times daily. 12/02/22   Terrilee Files, MD  nitroGLYCERIN (NITRODUR - DOSED IN MG/24 HR) 0.2 mg/hr patch Cut and apply 1/4 patch to most painful area q24h. 01/18/23   Myra Rude, MD  traMADol (ULTRAM) 50 MG tablet TAKE 1 TABLET(50 MG) BY MOUTH EVERY 6 HOURS AS NEEDED Patient not taking: Reported on 03/26/2021 12/30/20  Eldred Manges, MD  Vitamin D, Ergocalciferol, (DRISDOL) 1.25 MG (50000 UNIT) CAPS capsule Take 1 capsule (50,000 Units total) by mouth 2 (two) times a week. Patient not taking: Reported on 03/26/2021 01/09/21   [name], FNP      Allergies    Amoxicillin, Percocet [oxycodone-acetaminophen], and Gabapentin    Review of Systems   Review of Systems  Physical Exam Updated Vital Signs BP (!) 192/112   Pulse 82   Temp 98.7 F (37.1 C) (Oral)   Resp 17   LMP  (LMP Unknown)   SpO2 100%  Physical Exam  ED Results / Procedures / Treatments   Labs (all labs ordered are listed,  but only abnormal results are displayed) Labs Reviewed  URINALYSIS, ROUTINE W REFLEX MICROSCOPIC - Abnormal; Notable for the following components:      Result Value   Hgb urine dipstick TRACE (*)    All other components within normal limits  URINALYSIS, MICROSCOPIC (REFLEX) - Abnormal; Notable for the following components:   Bacteria, UA RARE (*)    All other components within normal limits  CBC WITH DIFFERENTIAL/PLATELET - Abnormal; Notable for the following components:   Abs Immature Granulocytes 0.10 (*)    All other components within normal limits  COMPREHENSIVE METABOLIC PANEL - Abnormal; Notable for the following components:   Sodium 134 (*)    Calcium 8.7 (*)    All other components within normal limits  CK    EKG None  Radiology No results found.  Procedures Procedures  {Document cardiac monitor, telemetry assessment procedure when appropriate:1}  Medications Ordered in ED Medications - No data to display  ED Course/ Medical Decision Making/ A&P   {   Click here for ABCD2, HEART and other calculatorsREFRESH Note before signing :1}                          Medical Decision Making Amount and/or Complexity of Data Reviewed Labs: ordered.   ***  {Document critical care time when appropriate:1} {Document review of labs and clinical decision tools ie heart score, Chads2Vasc2 etc:1}  {Document your independent review of radiology images, and any outside records:1} {Document your discussion with family members, caretakers, and with consultants:1} {Document social determinants of health affecting pt's care:1} {Document your decision making why or why not admission, treatments were needed:1} Final Clinical Impression(s) / ED Diagnoses Final diagnoses:  None    Rx / DC Orders ED Discharge Orders     None

## 2023-05-31 NOTE — Therapy (Signed)
OUTPATIENT PHYSICAL THERAPY TREATMENT NOTE/Progress Note   Patient Name: Meredith York MRN: [id number] DOB:1976/02/20, 47 y.o., female Today's Date: 06/01/2023  PCP: [name], FNP   REFERRING PROVIDER: Jones Broom, MD   Progress Note Reporting Period 04/20/23 to 06/01/23  See note below for Objective Data and Assessment of Progress/Goals.      END OF SESSION:   PT End of Session - 06/01/23 1033     Visit Number 10    Number of Visits 17    Date for PT Re-Evaluation 06/25/23    Authorization Type [employer]; [place] [employer] [place]    Authorization Time Period Request auth on 5th treatment date    Authorization - Visit Number 4    Authorization - Number of Visits 8    Progress Note Due on Visit 10    PT Start Time 1017    PT Stop Time 1107    PT Time Calculation (min) 50 min    Activity Tolerance Patient tolerated treatment well    Behavior During Therapy WFL for tasks assessed/performed                 Past Medical History:  Diagnosis Date   Carpal tunnel syndrome on right    Chlamydia    Gestational diabetes mellitus    Hypertension    no medicaqtions at this time   Migraines    Ovarian cyst    S/P lumbar discectomy 04/10/2015   Tendonitis    right wrist   Trichomonas    Type 2 diabetes mellitus (HCC)    not currently taking medication   Past Surgical History:  Procedure Laterality Date   ABDOMINAL HYSTERECTOMY     BACK SURGERY     CESAREAN SECTION  12/27/2011   Procedure: CESAREAN SECTION;  Surgeon: Brock Bad, MD;  Location: [facility];  Service: Gynecology;  Laterality: N/A;  Primary cesarean section with delivery of baby girl at 54. Apgars 3/3/6   CESAREAN SECTION N/A 08/03/2016   Procedure: CESAREAN SECTION;  Surgeon: Tereso Newcomer, MD;  Location: [facility];  Service: Obstetrics;  Laterality: N/A;   CHOLECYSTECTOMY  02/2009   DILATION AND CURETTAGE OF UTERUS N/A 06/25/2014   Procedure: DILATATION AND  CURETTAGE;  Surgeon: Reva Bores, MD;  Location: [facility];  Service: Gynecology;  Laterality: N/A;   LUMBAR LAMINECTOMY/DECOMPRESSION MICRODISCECTOMY Left 04/10/2015   Procedure: Left L5-S1 Microdiscectomy;  Surgeon: Eldred Manges, MD;  Location: [facility];  Service: Orthopedics;  Laterality: Left;   VAGINAL HYSTERECTOMY N/A 06/04/2020   Procedure: HYSTERECTOMY VAGINAL, MORCELLATION;  Surgeon: Hermina Staggers, MD;  Location: [facility];  Service: Gynecology;  Laterality: N/A;   WISDOM TOOTH EXTRACTION     Patient Active Problem List   Diagnosis Date Noted   Rotator cuff tear, left 01/18/2023   OA (osteoarthritis) of shoulder 12/03/2022   Peroneal tendon tear, left, subsequent encounter 01/17/2021   Recurrent herniation of lumbar disc 10/30/2020   Prediabetes 09/24/2020   Post-operative state 06/04/2020   Visit for routine gyn exam 11/29/2019   Encounter for screening mammogram for breast cancer 11/29/2019   History of bilateral tubal ligation 11/29/2019   S/P cesarean section 07/14/2016   Hypertension 07/05/2011    REFERRING DIAG: s/p left shoulder cuff /debridment ,SAD DCE 04/06/2023   THERAPY DIAG:  Acute pain of left shoulder  Muscle weakness (generalized)  Stiffness of left shoulder, not elsewhere classified  Rationale for Evaluation and Treatment Rehabilitation  ONSET  DATE: 4/16 surgery; Initial issue approx 2 years   SUBJECTIVE:                                                                                                                                                                                       SUBJECTIVE STATEMENT: Pt reports she is lifting her L shoulder a little better. ADLs with L hand below her shoulder are going well. Pt's report being in the ER yesterday with a kidney stone.  Hand dominance: Right   PAIN:  Are you having pain? Yes: NPRS scale: 4.5/10 Pain location: L GH area Pain description: ache, throb Aggravating factors: Certain  movement Relieving factors: Rest, Ice packs  PERTINENT HISTORY: High BMI-heavy arm; HTN   PRECAUTIONS: None   WEIGHT BEARING RESTRICTIONS: No WBAT   FALLS:  Has patient fallen in last 6 months? 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 Partial-thickness midsubstance tear of the mid to anterior supraspinatus tendon footprint measuring up to 10 mm in AP dimension. 2. Moderate degenerative changes of the acromioclavicular joint. High-grade marrow edema within the clavicular head suggests this may represent a source of pain. 3. Mild glenohumeral cartilage degenerative changes.   PATIENT SURVEYS:  FOTO: Perceived function   65%, predicted   68%. 05/25/23=56%   COGNITION: Overall cognitive status: Within functional limits for tasks assessed                                  SENSATION: WFL   POSTURE: Forward ead, rounded shoulders   UPPER EXTREMITY ROM:    Active ROM Right eval Left eval LT 04/22/23 LT 04/28/23 LT 05/04/23 LT 05/06/23 LT 05/19/23 Lt 05/27/23  Shoulder flexion 150 A62; P80 AA120 P125 P135  A120 P150 135  Shoulder extension            Shoulder abduction 154 A72; P85  P115 P120   125  Shoulder adduction            Shoulder internal rotation T4 A T1; P40  P 60 P65 P70  Left PSIS  Shoulder external rotation T7 A GT  hip; P70  P 90 P90   Reaches back of neck  Elbow flexion            Elbow extension            Wrist flexion            Wrist extension            Wrist  ulnar deviation            Wrist radial deviation            Wrist pronation            Wrist supination            (Blank rows = not tested)   UPPER EXTREMITY MMT: L shoulder generally 2+ to 3_ MMT Right eval Left eval  Shoulder flexion 5    Shoulder extension 5    Shoulder abduction 5    Shoulder adduction 5    Shoulder internal rotation 5    Shoulder external rotation 5    Middle trapezius      Lower trapezius      Elbow flexion      Elbow extension      Wrist flexion      Wrist extension      Wrist ulnar deviation      Wrist radial deviation      Wrist pronation      Wrist supination      Grip strength (lbs)      (Blank rows = not tested)   SHOULDER SPECIAL TESTS: Impingement tests:  NT SLAP lesions:  NT Instability tests:  NT Rotator cuff assessment:  NT Biceps assessment:  NT   PALPATION:  TTP peri-GH jt area             TODAY'S TREATMENT:  [facility] Adult PT Treatment:                                                DATE: 06/01/23 Therapeutic Exercise: UBE L1 2 mins FWD/BWD UE ranger for flexion and scaption 2x10 Sleeper stretch x3 30" Supine shoulder flexion 2x10 1# S/L shoulder ER 2x10 1# S/L shoulder abd 2x10 !# Prone Shoulder ext 10 x 2 1# Supine serratus press 2x10 2# Modalities Cold pack to the L sholder x 10 mins  [facility] Adult PT Treatment:                                                DATE: 05/27/23 Therapeutic Exercise: Pulleys  Row  10 x 2 red Shoulder ext 10 x 2 red Left IR YTB 10 x 2  Left ER YTB 10 x 2  Prone I and T 2 x 10 each  S/L abduction 1# x 10, AROM x 10  S/L 1# ER 2 x 10  Supine flexion 10 x 2  Supine serratus press 2x10 1# IR AAROm using opp UE to assist 10 sec x 2  Modalities Cold pack to the L sholder x 10 mins  [facility] Adult PT Treatment:  DATE: 05/25/23 Therapeutic Exercise: Pulleys for flexion and scaption S/L L shoulder ER 1# 2x10 S/L L shoulder abd 1# 2x10 Prone I's and  Ts x10 Supine flexion x10 Supine serratus press 2x10 1# YTB shoulder ER 10x each YTB shoulder IR 10 x 2 each Modalities Cold pack to the L sholder x 10 mins  PATIENT EDUCATION: Education details: Eval findings, POC, HEP, self care  Person educated: Patient Education method: Explanation, Demonstration, Tactile cues, Verbal cues, and Handouts Education comprehension: verbalized understanding, returned demonstration, verbal cues required, and tactile cues required   HOME EXERCISE PROGRAM: Access Code: [email or web address] URL: [email or web address] Date: 06/01/2023 Prepared by: Joellyn Rued  Exercises - Seated Shoulder Flexion Towel Slide at Table Top Full Range of Motion  - 3 x daily - 7 x weekly - 1 sets - 15 reps - 3 hold - Seated Shoulder Scaption Slide at Table Top with Forearm in Neutral  - 3 x daily - 7 x weekly - 3 sets - 10 reps - 3 hold - Seated Shoulder External Rotation PROM on Table  - 3 x daily - 7 x weekly - 3 sets - 10 reps - 3 hold - Isometric Shoulder Flexion at Wall  - 1 x daily - 7 x weekly - 1 sets - 10 reps - 5 hold - Isometric Shoulder Extension at Wall  - 1 x daily - 7 x weekly - 1 sets - 10 reps - 5 hold - Standing Isometric Shoulder External Rotation with Doorway  - 1 x daily - 7 x weekly - 1 sets - 10 reps - 5 hold - Standing Isometric Shoulder Internal Rotation with Towel Roll at Doorway  - 1 x daily - 7 x weekly - 1 sets - 10 reps - 5 hold - Isometric Shoulder Abduction at Wall  - 1 x daily - 7 x weekly - 1 sets - 10 reps - 5 hold - Standing Shoulder Row with Anchored Resistance  - 1 x daily - 7 x weekly - 2 sets - 10 reps - 3 hold - Shoulder Extension with Resistance  - 1 x daily - 7 x weekly - 3 sets - 10 reps - 3 hold - Sidelying Shoulder External Rotation  - 1 x daily - 7 x weekly - 2 sets - 10 reps - Supine Shoulder Press  - 1 x daily - 7 x weekly - 2 sets - 10 reps - 3 hold - Sidelying Shoulder Abduction Palm Forward  - 1 x daily - 7 x weekly - 2 sets  - 10 reps - 3 hold - Shoulder Flexion Wall Slide with Towel  - 1 x daily - 7 x weekly - 1-2 sets - 10 reps - Sleeper Stretch  - 1 x daily - 7 x weekly - 1 sets - 3 reps - 30 hold   ASSESSMENT:   CLINICAL IMPRESSION: Pt reports L shoulder soreness c repetitions of each therex is improving. PT was completed L shoulder IR ROM, and L periscapular and RC strengthening. The demand for RC strengthening was gradually progressed. Pt is making appropriate progress with ROM and strengthening. Pt tolerated prescribed therex without adverse effects. Pt will continue to benefit from skilled PT to address impairments for improved L shoulder function with less pain.   OBJECTIVE IMPAIRMENTS: decreased ROM, decreased strength, impaired UE functional use, obesity, and pain.    ACTIVITY LIMITATIONS: carrying, lifting, dressing, reach over head, and caring for others   PARTICIPATION LIMITATIONS: meal prep, cleaning, laundry, driving, shopping,  and occupation   PERSONAL FACTORS: Fitness, Past/current experiences, Time since onset of injury/illness/exacerbation, and 1 comorbidity: igh BMI-heavy arm  are also affecting patient's functional outcome.    REHAB POTENTIAL: Good   CLINICAL DECISION MAKING: Evolving/moderate complexity   EVALUATION COMPLEXITY: Moderate     GOALS:   SHORT TERM GOALS: Target date: 05/07/23   Pt will be Ind in an initial HEP Baseline: started Goal status: MET   2.  Pt will voice understanding of measures to assist in pain reduction  Baseline: started 05/04/23: able to voice  Goal status: MET   3.  Increase L shoulder PROM: flexion- 120, abd- 120d, ER- 60, IR 80d Baseline: see flow sheet 05/04/23: PROM: flexion- 120, abd- 120d, ER- 90, IR 65d Goal status: PARTIALLY MET   LONG TERM GOALS: Target date: 06/25/23   Pt will be Ind in a final HEP to maintain achieved LOF Baseline: started Goal status: Ongoing   2.  Increase L shoulder AROM to within 90% of the R for improved  functional use of the L UE Baseline: see flow sheets Goal status: Ongoing   3.  Increase L shoulder strength to 4+/5 or greater for improved functional use of the L UE Baseline: 2+ to 3-/5 Goal status: Ongoing   4.  Pt will be able to lift 2lbs x10 reps above shoulder height to reflect demands consistent with her work Baseline: NT  Goal status: Ongoing   5.  Pt will be able to lift 10lbs x10 reps 2 handed form knee to chest height to reflect demands consistent with her work Baseline: NT Goal status: Ongoing     PLAN:   PT FREQUENCY: 2x/week   PT DURATION: 8 weeks   PLANNED INTERVENTIONS: Therapeutic exercises, Therapeutic activity, Patient/Family education, Self Care, Joint mobilization, Dry Needling, Cryotherapy, Moist heat, Taping, Vasopneumatic device, Traction, Ionotophoresis 4mg /ml Dexamethasone, Manual therapy, and Re-evaluation   PLAN FOR NEXT SESSION: Review FOTO; assess response to HEP; progress therex as indicated (isometric and periscapular strengthening); use of modalities, manual therapy; and TPDN as indicated. (S/P debridement Surgery 04/06/23)   Joellyn Rued MS, PT 06/01/23 12:22 PM

## 2023-06-01 ENCOUNTER — Ambulatory Visit: Payer: 59

## 2023-06-01 DIAGNOSIS — M25512 Pain in left shoulder: Secondary | ICD-10-CM

## 2023-06-01 DIAGNOSIS — M6281 Muscle weakness (generalized): Secondary | ICD-10-CM

## 2023-06-01 DIAGNOSIS — M25612 Stiffness of left shoulder, not elsewhere classified: Secondary | ICD-10-CM

## 2023-06-01 LAB — GC/CHLAMYDIA PROBE AMP (~~LOC~~) NOT AT ARMC
Chlamydia: NEGATIVE
Comment: NEGATIVE
Comment: NORMAL
Neisseria Gonorrhea: NEGATIVE

## 2023-06-03 ENCOUNTER — Encounter: Payer: Self-pay | Admitting: Physical Therapy

## 2023-06-03 ENCOUNTER — Ambulatory Visit: Payer: 59 | Admitting: Physical Therapy

## 2023-06-03 DIAGNOSIS — M25512 Pain in left shoulder: Secondary | ICD-10-CM

## 2023-06-03 DIAGNOSIS — M6281 Muscle weakness (generalized): Secondary | ICD-10-CM

## 2023-06-03 NOTE — Therapy (Signed)
OUTPATIENT PHYSICAL THERAPY TREATMENT NOTE/Progress Note   Patient Name: Meredith York MRN: [id number] DOB:11-18-1976, 47 y.o., female Today's Date: 06/03/2023  PCP: [name], FNP   REFERRING PROVIDER: Jones Broom, MD      END OF SESSION:   PT End of Session - 06/03/23 0850     Visit Number 11    Number of Visits 17    Date for PT Re-Evaluation 06/25/23    Authorization Type [employer]; [place] [employer] [place]    Authorization - Visit Number 5    Authorization - Number of Visits 8    Progress Note Due on Visit 10    PT Start Time 0845    PT Stop Time 0923    PT Time Calculation (min) 38 min                 Past Medical History:  Diagnosis Date   Carpal tunnel syndrome on right    Chlamydia    Gestational diabetes mellitus    Hypertension    no medicaqtions at this time   Migraines    Ovarian cyst    S/P lumbar discectomy 04/10/2015   Tendonitis    right wrist   Trichomonas    Type 2 diabetes mellitus (HCC)    not currently taking medication   Past Surgical History:  Procedure Laterality Date   ABDOMINAL HYSTERECTOMY     BACK SURGERY     CESAREAN SECTION  12/27/2011   Procedure: CESAREAN SECTION;  Surgeon: Brock Bad, MD;  Location: [facility];  Service: Gynecology;  Laterality: N/A;  Primary cesarean section with delivery of baby girl at 81. Apgars 3/3/6   CESAREAN SECTION N/A 08/03/2016   Procedure: CESAREAN SECTION;  Surgeon: Tereso Newcomer, MD;  Location: [facility];  Service: Obstetrics;  Laterality: N/A;   CHOLECYSTECTOMY  02/2009   DILATION AND CURETTAGE OF UTERUS N/A 06/25/2014   Procedure: DILATATION AND CURETTAGE;  Surgeon: Reva Bores, MD;  Location: [facility];  Service: Gynecology;  Laterality: N/A;   LUMBAR LAMINECTOMY/DECOMPRESSION MICRODISCECTOMY Left 04/10/2015   Procedure: Left L5-S1 Microdiscectomy;  Surgeon: Eldred Manges, MD;  Location: [facility];  Service: Orthopedics;  Laterality: Left;   VAGINAL  HYSTERECTOMY N/A 06/04/2020   Procedure: HYSTERECTOMY VAGINAL, MORCELLATION;  Surgeon: Hermina Staggers, MD;  Location: [facility];  Service: Gynecology;  Laterality: N/A;   WISDOM TOOTH EXTRACTION     Patient Active Problem List   Diagnosis Date Noted   Rotator cuff tear, left 01/18/2023   OA (osteoarthritis) of shoulder 12/03/2022   Peroneal tendon tear, left, subsequent encounter 01/17/2021   Recurrent herniation of lumbar disc 10/30/2020   Prediabetes 09/24/2020   Post-operative state 06/04/2020   Visit for routine gyn exam 11/29/2019   Encounter for screening mammogram for breast cancer 11/29/2019   History of bilateral tubal ligation 11/29/2019   S/P cesarean section 07/14/2016   Hypertension 07/05/2011    REFERRING DIAG: s/p left shoulder cuff /debridment ,SAD DCE 04/06/2023   THERAPY DIAG:  Acute pain of left shoulder  Muscle weakness (generalized)  Rationale for Evaluation and Treatment Rehabilitation  ONSET DATE: 4/16 surgery; Initial issue approx 2 years   SUBJECTIVE:  SUBJECTIVE STATEMENT: Pt reports she is lifting her L shoulder a little better. ADLs with L hand below her shoulder are going well. Pt's report being in the ER yesterday with a kidney stone.  Hand dominance: Right   PAIN:  Are you having pain? Yes: NPRS scale: 4.5/10 Pain location: L GH area Pain description: ache, throb Aggravating factors: Certain movement Relieving factors: Rest, Ice packs  PERTINENT HISTORY: High BMI-heavy arm; HTN   PRECAUTIONS: None   WEIGHT BEARING RESTRICTIONS: No WBAT   FALLS:  Has patient fallen in last 6 months? 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 Partial-thickness midsubstance tear of the mid to anterior supraspinatus tendon footprint measuring up to 10 mm in AP dimension. 2. Moderate degenerative changes of the acromioclavicular joint. High-grade marrow edema within the clavicular head suggests this may represent a source of pain. 3. Mild glenohumeral cartilage degenerative changes.   PATIENT SURVEYS:  FOTO: Perceived function   65%, predicted   68%. 05/25/23=56%   COGNITION: Overall cognitive status: Within functional limits for tasks assessed                                  SENSATION: WFL   POSTURE: Forward ead, rounded shoulders   UPPER EXTREMITY ROM:    Active ROM Right eval Left eval LT 04/22/23 LT 04/28/23 LT 05/04/23 LT 05/06/23 LT 05/19/23 Lt 05/27/23 LT 06/03/23  Shoulder flexion 150 A62; P80 AA120 P125 P135  A120 P150 135 145  Shoulder extension             Shoulder abduction 154 A72; P85  P115 P120   125 135  Shoulder adduction             Shoulder internal rotation T4 A T1; P40  P 60 P65 P70  Left PSIS Reaches L5   Shoulder external rotation T7 A GT hip; P70  P 90 P90   Reaches back of neck T2  Elbow flexion             Elbow extension             Wrist flexion             Wrist extension             Wrist ulnar deviation             Wrist radial deviation             Wrist pronation             Wrist supination             (Blank rows = not tested)   UPPER EXTREMITY MMT: L shoulder generally 2+ to 3_ MMT Right eval Left eval  Shoulder flexion 5    Shoulder extension 5    Shoulder abduction 5     Shoulder adduction 5    Shoulder internal rotation 5    Shoulder external  rotation 5    Middle trapezius      Lower trapezius      Elbow flexion      Elbow extension      Wrist flexion      Wrist extension      Wrist ulnar deviation      Wrist radial deviation      Wrist pronation      Wrist supination      Grip strength (lbs)      (Blank rows = not tested)   SHOULDER SPECIAL TESTS: Impingement tests:  NT SLAP lesions:  NT Instability tests:  NT Rotator cuff assessment:  NT Biceps assessment:  NT   PALPATION:  TTP peri-GH jt area             TODAY'S TREATMENT:  [facility] Adult PT Treatment:                                                DATE: 06/03/23 Therapeutic Exercise: UBE Level 1 x 2 min each way  Pulleys  Row  10 x 2 red Shoulder ext 10 x 2 red Left shoulder IR YTB 10 x 2  Left shoulder ER YTB  10 x 2  UE ranger for flexion and scaption 2x10 Sleeper stretch 10 sec x 4  Supine shoulder flexion 2 x 10 1# Supine serratus press 2x10 2# S/L ER 1# x 10, 2# x 10  S/L abdct 1# x 10, 2# x 10 Passive IR   20 sec x 3 supine  Standing IR AAROM with dowel 10 x 2    [facility] Adult PT Treatment:                                                DATE: 06/01/23 Therapeutic Exercise: UBE L1 2 mins FWD/BWD UE ranger for flexion and scaption 2x10 Sleeper stretch x3 30" Supine shoulder flexion 2x10 1# S/L shoulder ER 2x10 1# S/L shoulder abd 2x10 !# Prone Shoulder ext 10 x 2 1# Supine serratus press 2x10 2# Modalities Cold pack to the L sholder x 10 mins  [facility] Adult PT Treatment:                                                DATE: 05/27/23 Therapeutic Exercise: Pulleys  Row  10 x 2 red Shoulder ext 10 x 2 red Left IR YTB 10 x 2  Left ER YTB 10 x 2  Prone I and T 2 x 10 each  S/L abduction 1# x 10, AROM x 10  S/L 1# ER 2 x 10  Supine flexion 10 x 2  Supine serratus press 2x10 1# IR AAROm using opp UE to assist 10 sec x 2  Modalities Cold pack to the L sholder x 10  mins  [facility] Adult PT Treatment:  DATE: 05/25/23 Therapeutic Exercise: Pulleys for flexion and scaption S/L L shoulder ER 1# 2x10 S/L L shoulder abd 1# 2x10 Prone I's and Ts x10 Supine flexion x10 Supine serratus press 2x10 1# YTB shoulder ER 10x each YTB shoulder IR 10 x 2 each Modalities Cold pack to the L sholder x 10 mins  PATIENT EDUCATION: Education details: Eval findings, POC, HEP, self care  Person educated: Patient Education method: Explanation, Demonstration, Tactile cues, Verbal cues, and Handouts Education comprehension: verbalized understanding, returned demonstration, verbal cues required, and tactile cues required   HOME EXERCISE PROGRAM: Access Code: [email or web address] URL: [email or web address] Date: 06/01/2023 Prepared by: Joellyn Rued  Exercises - Seated Shoulder Flexion Towel Slide at Table Top Full Range of Motion  - 3 x daily - 7 x weekly - 1 sets - 15 reps - 3 hold - Seated Shoulder Scaption Slide at Table Top with Forearm in Neutral  - 3 x daily - 7 x weekly - 3 sets - 10 reps - 3 hold - Seated Shoulder External Rotation PROM on Table  - 3 x daily - 7 x weekly - 3 sets - 10 reps - 3 hold - Isometric Shoulder Flexion at Wall  - 1 x daily - 7 x weekly - 1 sets - 10 reps - 5 hold - Isometric Shoulder Extension at Wall  - 1 x daily - 7 x weekly - 1 sets - 10 reps - 5 hold - Standing Isometric Shoulder External Rotation with Doorway  - 1 x daily - 7 x weekly - 1 sets - 10 reps - 5 hold - Standing Isometric Shoulder Internal Rotation with Towel Roll at Doorway  - 1 x daily - 7 x weekly - 1 sets - 10 reps - 5 hold - Isometric Shoulder Abduction at Wall  - 1 x daily - 7 x weekly - 1 sets - 10 reps - 5 hold - Standing Shoulder Row with Anchored Resistance  - 1 x daily - 7 x weekly - 2 sets - 10 reps - 3 hold - Shoulder Extension with Resistance  - 1 x daily - 7 x weekly - 3 sets - 10 reps - 3 hold - Sidelying Shoulder  External Rotation  - 1 x daily - 7 x weekly - 2 sets - 10 reps - Supine Shoulder Press  - 1 x daily - 7 x weekly - 2 sets - 10 reps - 3 hold - Sidelying Shoulder Abduction Palm Forward  - 1 x daily - 7 x weekly - 2 sets - 10 reps - 3 hold - Shoulder Flexion Wall Slide with Towel  - 1 x daily - 7 x weekly - 1-2 sets - 10 reps - Sleeper Stretch  - 1 x daily - 7 x weekly - 1 sets - 3 reps - 30 hold   ASSESSMENT:   CLINICAL IMPRESSION: Pt reports L shoulder soreness c repetitions of each therex is improving. PT was completed L shoulder IR ROM, and L periscapular and RC strengthening. The demand for RC strengthening was gradually progressed. Pt is making appropriate progress with ROM and strengthening. See AROM measurements. She is not reaching lower lumbar. Pt tolerated prescribed therex without adverse effects. Pt will continue to benefit from skilled PT to address impairments for improved L shoulder function with less pain.   OBJECTIVE IMPAIRMENTS: decreased ROM, decreased strength, impaired UE functional use, obesity, and pain.    ACTIVITY LIMITATIONS: carrying, lifting, dressing, reach over head, and caring for others  PARTICIPATION LIMITATIONS: meal prep, cleaning, laundry, driving, shopping, and occupation   PERSONAL FACTORS: Fitness, Past/current experiences, Time since onset of injury/illness/exacerbation, and 1 comorbidity: igh BMI-heavy arm  are also affecting patient's functional outcome.    REHAB POTENTIAL: Good   CLINICAL DECISION MAKING: Evolving/moderate complexity   EVALUATION COMPLEXITY: Moderate     GOALS:   SHORT TERM GOALS: Target date: 05/07/23   Pt will be Ind in an initial HEP Baseline: started Goal status: MET   2.  Pt will voice understanding of measures to assist in pain reduction  Baseline: started 05/04/23: able to voice  Goal status: MET   3.  Increase L shoulder PROM: flexion- 120, abd- 120d, ER- 60, IR 80d Baseline: see flow sheet 05/04/23: PROM:  flexion- 120, abd- 120d, ER- 90, IR 65d Goal status: PARTIALLY MET   LONG TERM GOALS: Target date: 06/25/23   Pt will be Ind in a final HEP to maintain achieved LOF Baseline: started Goal status: Ongoing   2.  Increase L shoulder AROM to within 90% of the R for improved functional use of the L UE Baseline: see flow sheets Goal status: Ongoing   3.  Increase L shoulder strength to 4+/5 or greater for improved functional use of the L UE Baseline: 2+ to 3-/5 Goal status: Ongoing   4.  Pt will be able to lift 2lbs x10 reps above shoulder height to reflect demands consistent with her work Baseline: NT  Goal status: Ongoing   5.  Pt will be able to lift 10lbs x10 reps 2 handed form knee to chest height to reflect demands consistent with her work Baseline: NT Goal status: Ongoing     PLAN:   PT FREQUENCY: 2x/week   PT DURATION: 8 weeks   PLANNED INTERVENTIONS: Therapeutic exercises, Therapeutic activity, Patient/Family education, Self Care, Joint mobilization, Dry Needling, Cryotherapy, Moist heat, Taping, Vasopneumatic device, Traction, Ionotophoresis 4mg /ml Dexamethasone, Manual therapy, and Re-evaluation   PLAN FOR NEXT SESSION: Review FOTO; assess response to HEP; progress therex as indicated (isometric and periscapular strengthening); use of modalities, manual therapy; and TPDN as indicated. (S/P debridement Surgery 04/06/23)   Jannette Spanner, PTA 06/03/23 9:30 AM Phone: [phone] Fax: [phone]

## 2023-06-05 ENCOUNTER — Emergency Department (HOSPITAL_BASED_OUTPATIENT_CLINIC_OR_DEPARTMENT_OTHER): Payer: 59

## 2023-06-05 ENCOUNTER — Other Ambulatory Visit: Payer: Self-pay

## 2023-06-05 ENCOUNTER — Encounter (HOSPITAL_BASED_OUTPATIENT_CLINIC_OR_DEPARTMENT_OTHER): Payer: Self-pay

## 2023-06-05 ENCOUNTER — Emergency Department (HOSPITAL_BASED_OUTPATIENT_CLINIC_OR_DEPARTMENT_OTHER)
Admission: EM | Admit: 2023-06-05 | Discharge: 2023-06-05 | Disposition: A | Discharge status: Home / Self Care | Payer: 59 | Attending: Emergency Medicine | Admitting: Emergency Medicine

## 2023-06-05 DIAGNOSIS — R1031 Right lower quadrant pain: Secondary | ICD-10-CM | POA: Insufficient documentation

## 2023-06-05 DIAGNOSIS — R1011 Right upper quadrant pain: Secondary | ICD-10-CM | POA: Insufficient documentation

## 2023-06-05 DIAGNOSIS — R109 Unspecified abdominal pain: Secondary | ICD-10-CM

## 2023-06-05 LAB — URINALYSIS, ROUTINE W REFLEX MICROSCOPIC
Bilirubin Urine: NEGATIVE
Glucose, UA: NEGATIVE mg/dL
Ketones, ur: NEGATIVE mg/dL
Leukocytes,Ua: NEGATIVE
Nitrite: NEGATIVE
Protein, ur: NEGATIVE mg/dL
Specific Gravity, Urine: 1.02 (ref 1.005–1.030)
pH: 8.5 — ABNORMAL HIGH (ref 5.0–8.0)

## 2023-06-05 LAB — COMPREHENSIVE METABOLIC PANEL
ALT: 17 U/L (ref 0–44)
AST: 15 U/L (ref 15–41)
Albumin: 3.6 g/dL (ref 3.5–5.0)
Alkaline Phosphatase: 80 U/L (ref 38–126)
Anion gap: 9 (ref 5–15)
BUN: 12 mg/dL (ref 6–20)
CO2: 25 mmol/L (ref 22–32)
Calcium: 8.9 mg/dL (ref 8.9–10.3)
Chloride: 104 mmol/L (ref 98–111)
Creatinine, Ser: 0.61 mg/dL (ref 0.44–1.00)
GFR, Estimated: >60 mL/min (ref >60)
Glucose, Bld: 128 mg/dL — ABNORMAL HIGH (ref 70–99)
Potassium: 3.3 mmol/L — ABNORMAL LOW (ref 3.5–5.1)
Sodium: 138 mmol/L (ref 135–145)
Total Bilirubin: 0.4 mg/dL (ref 0.3–1.2)
Total Protein: 7 g/dL (ref 6.5–8.1)

## 2023-06-05 LAB — CBC WITH DIFFERENTIAL/PLATELET
Abs Immature Granulocytes: 0.03 10*3/uL (ref 0.00–0.07)
Basophils Absolute: 0 10*3/uL (ref 0.0–0.1)
Basophils Relative: 1 %
Eosinophils Absolute: 0.2 10*3/uL (ref 0.0–0.5)
Eosinophils Relative: 3 %
HCT: 39.8 % (ref 36.0–46.0)
Hemoglobin: 12.9 g/dL (ref 12.0–15.0)
Immature Granulocytes: 0 %
Lymphocytes Relative: 28 %
Lymphs Abs: 2.3 10*3/uL (ref 0.7–4.0)
MCH: 29.7 pg (ref 26.0–34.0)
MCHC: 32.4 g/dL (ref 30.0–36.0)
MCV: 91.7 fL (ref 80.0–100.0)
Monocytes Absolute: 0.9 10*3/uL (ref 0.1–1.0)
Monocytes Relative: 11 %
Neutro Abs: 4.8 10*3/uL (ref 1.7–7.7)
Neutrophils Relative %: 57 %
Platelets: 288 10*3/uL (ref 150–400)
RBC: 4.34 MIL/uL (ref 3.87–5.11)
RDW: 13.6 % (ref 11.5–15.5)
WBC: 8.3 10*3/uL (ref 4.0–10.5)
nRBC: 0 % (ref 0.0–0.2)

## 2023-06-05 LAB — URINALYSIS, MICROSCOPIC (REFLEX)

## 2023-06-05 LAB — D-DIMER, QUANTITATIVE: D-Dimer, Quant: 0.54 ug/mL-FEU — ABNORMAL HIGH (ref 0.00–0.50)

## 2023-06-05 LAB — LIPASE, BLOOD: Lipase: 31 U/L (ref 11–51)

## 2023-06-05 MED ORDER — IOHEXOL 350 MG/ML SOLN
75.0000 mL | Freq: Once | INTRAVENOUS | Status: AC | PRN
  Administered 2023-06-05: 75 mL via INTRAVENOUS

## 2023-06-05 MED ORDER — NAPROXEN 500 MG PO TABS: 500.0000 mg | ORAL_TABLET | Freq: Two times a day (BID) | ORAL | 0 refills | Status: DC | PRN

## 2023-06-05 NOTE — ED Provider Notes (Signed)
[place] [facility] Provider Note   CSN: [id number] Arrival date & time: 06/05/23  0220     History  Chief Complaint  Patient presents with   Flank Pain    Meredith York is a 47 y.o. female.  Patient with 2 days of right-sided flank pain that radiates to her right abdomen.  Pain is constant but waxes and wanes in severity.  Taking Tylenol at home without relief as well as ibuprofen.  She was seen recently 5 days ago for concern for UTI but culture was negative.  At that time she was started on amlodipine and does not normally take blood pressure medications.  She reports this pain is new over the past several days.  There is no radiation of the pain down her buttock or down her leg.  No focal weakness, numbness or tingling.  Has had dark and cloudy urine but no pain with urination or blood in the urine.  No chest pain or shortness of breath but does become short of breath with this pain.  No fever.  No chest pain.  No history of bowel or bladder incontinence.  No history of IV drug abuse or cancer.  States this is different than her chronic back pain.  No history of kidney stones  The history is provided by the patient.  Flank Pain Associated symptoms include abdominal pain. Pertinent negatives include no chest pain, no headaches and no shortness of breath.       Home Medications Prior to Admission medications   Medication Sig Start Date End Date Taking? Authorizing Provider  amLODipine (NORVASC) 5 MG tablet Take 1 tablet (5 mg total) by mouth daily. 05/31/23 05/30/24  Alvira Monday, MD  celecoxib (CELEBREX) 200 MG capsule Take 1 capsule (200 mg total) by mouth 2 (two) times daily as needed. One to 2 tablets by mouth daily as needed for pain. 12/28/22   Myra Rude, MD  hydrochlorothiazide (HYDRODIURIL) 25 MG tablet Take 1 tablet (25 mg total) by mouth daily. 09/24/20   [name], FNP  ibuprofen (ADVIL) 800 MG tablet Take 1 tablet (800 mg  total) by mouth 3 (three) times daily. 12/02/22   Terrilee Files, MD  nitroGLYCERIN (NITRODUR - DOSED IN MG/24 HR) 0.2 mg/hr patch Cut and apply 1/4 patch to most painful area q24h. 01/18/23   Myra Rude, MD  traMADol (ULTRAM) 50 MG tablet TAKE 1 TABLET(50 MG) BY MOUTH EVERY 6 HOURS AS NEEDED Patient not taking: Reported on 03/26/2021 12/30/20   Eldred Manges, MD  Vitamin D, Ergocalciferol, (DRISDOL) 1.25 MG (50000 UNIT) CAPS capsule Take 1 capsule (50,000 Units total) by mouth 2 (two) times a week. Patient not taking: Reported on 03/26/2021 01/09/21   [name], FNP      Allergies    Amoxicillin, Percocet [oxycodone-acetaminophen], and Gabapentin    Review of Systems   Review of Systems  Constitutional:  Negative for activity change, appetite change and fever.  HENT:  Negative for congestion and rhinorrhea.   Respiratory:  Negative for cough, chest tightness and shortness of breath.   Cardiovascular:  Negative for chest pain.  Gastrointestinal:  Positive for abdominal pain and nausea. Negative for vomiting.  Genitourinary:  Positive for flank pain. Negative for dysuria.  Musculoskeletal:  Positive for arthralgias, back pain and myalgias.  Skin:  Negative for rash.  Neurological:  Negative for dizziness, weakness and headaches.   all other systems are negative except as noted in the  HPI and PMH.    Physical Exam Updated Vital Signs BP (!) 163/99   Pulse 90   Temp 98.3 F (36.8 C) (Oral)   Resp 20   Ht 5' 2.5" (1.588 m)   Wt 97.5 kg   LMP  (LMP Unknown)   SpO2 98%   BMI 38.70 kg/m  Physical Exam Vitals and nursing note reviewed.  Constitutional:      General: She is not in acute distress.    Appearance: She is well-developed.  HENT:     Head: Normocephalic and atraumatic.     Mouth/Throat:     Pharynx: No oropharyngeal exudate.  Eyes:     Conjunctiva/sclera: Conjunctivae normal.     Pupils: Pupils are equal, round, and reactive to light.  Neck:     Comments:  No meningismus. Cardiovascular:     Rate and Rhythm: Normal rate and regular rhythm.     Heart sounds: Normal heart sounds. No murmur heard. Pulmonary:     Effort: Pulmonary effort is normal. No respiratory distress.     Breath sounds: Normal breath sounds.  Abdominal:     Palpations: Abdomen is soft.     Tenderness: There is abdominal tenderness. There is no guarding or rebound.     Comments: TTP RUQ and RLQ. No guarding or rebound  Musculoskeletal:        General: Tenderness present. Normal range of motion.     Cervical back: Normal range of motion and neck supple.     Comments: R CVAT  Skin:    General: Skin is warm.  Neurological:     Mental Status: She is alert and oriented to person, place, and time.     Cranial Nerves: No cranial nerve deficit.     Motor: No abnormal muscle tone.     Coordination: Coordination normal.     Comments: No ataxia on finger to nose bilaterally. No pronator drift. 5/5 strength throughout. CN 2-12 intact.Equal grip strength. Sensation intact.   Psychiatric:        Behavior: Behavior normal.     ED Results / Procedures / Treatments   Labs (all labs ordered are listed, but only abnormal results are displayed) Labs Reviewed  URINALYSIS, ROUTINE W REFLEX MICROSCOPIC - Abnormal; Notable for the following components:      Result Value   APPearance CLOUDY (*)    pH 8.5 (*)    Hgb urine dipstick TRACE (*)    All other components within normal limits  COMPREHENSIVE METABOLIC PANEL - Abnormal; Notable for the following components:   Potassium 3.3 (*)    Glucose, Bld 128 (*)    All other components within normal limits  D-DIMER, QUANTITATIVE - Abnormal; Notable for the following components:   D-Dimer, Quant 0.54 (*)    All other components within normal limits  URINALYSIS, MICROSCOPIC (REFLEX) - Abnormal; Notable for the following components:   Bacteria, UA RARE (*)    All other components within normal limits  CBC WITH DIFFERENTIAL/PLATELET   LIPASE, BLOOD    EKG None  Radiology CT Angio Chest PE W and/or Wo Contrast  Result Date: 06/05/2023 CLINICAL DATA:  47 year old female with history of positive D-dimer. Right flank and back pain. EXAM: CT ANGIOGRAPHY CHEST WITH CONTRAST TECHNIQUE: Multidetector CT imaging of the chest was performed using the standard protocol during bolus administration of intravenous contrast. Multiplanar CT image reconstructions and MIPs were obtained to evaluate the vascular anatomy. RADIATION DOSE REDUCTION: This exam was performed according to the  departmental dose-optimization program which includes automated exposure control, adjustment of the mA and/or kV according to patient size and/or use of iterative reconstruction technique. CONTRAST:  75mL OMNIPAQUE IOHEXOL 350 MG/ML SOLN COMPARISON:  No priors. FINDINGS: Cardiovascular: No filling defects within the pulmonary arterial tree to suggest pulmonary embolism. Heart size is mildly enlarged There is no significant pericardial fluid, thickening or pericardial calcification. Aortic atherosclerosis. No definite coronary artery calcifications. Mediastinum/Nodes: No pathologically enlarged mediastinal or hilar lymph nodes. Esophagus is unremarkable in appearance. No axillary lymphadenopathy. Lungs/Pleura: No acute consolidative airspace disease. No pleural effusions. No suspicious appearing pulmonary nodules or masses are noted. Upper Abdomen: Status post cholecystectomy. Musculoskeletal: There are no aggressive appearing lytic or blastic lesions noted in the visualized portions of the skeleton. Review of the MIP images confirms the above findings. IMPRESSION: 1. No acute findings are noted in the thorax to account for the patient's symptoms. Specifically, no evidence of pulmonary embolism. Electronically Signed   By: Trudie Reed M.D.   On: 06/05/2023 06:02   CT Renal Stone Study  Result Date: 06/05/2023 CLINICAL DATA:  Cloudy urine with right flank pain,  initial encounter EXAM: CT ABDOMEN AND PELVIS WITHOUT CONTRAST TECHNIQUE: Multidetector CT imaging of the abdomen and pelvis was performed following the standard protocol without IV contrast. RADIATION DOSE REDUCTION: This exam was performed according to the departmental dose-optimization program which includes automated exposure control, adjustment of the mA and/or kV according to patient size and/or use of iterative reconstruction technique. COMPARISON:  None Available. FINDINGS: Lower chest: No acute abnormality. Hepatobiliary: No focal liver abnormality is seen. Status post cholecystectomy. No biliary dilatation. Pancreas: Unremarkable. No pancreatic ductal dilatation or surrounding inflammatory changes. Spleen: Normal in size without focal abnormality. Adrenals/Urinary Tract: Adrenal glands are within normal limits. Kidneys are well visualized bilaterally. No renal calculi are seen. No obstructive changes are noted. The bladder is within normal limits. Stomach/Bowel: No obstructive or inflammatory changes of the colon are seen. The appendix is within normal limits. Small bowel and stomach are within normal limits. Vascular/Lymphatic: Aortic atherosclerosis. No enlarged abdominal or pelvic lymph nodes. Reproductive: Status post hysterectomy. No adnexal masses. Other: No abdominal wall hernia or abnormality. No abdominopelvic ascites. Musculoskeletal: No acute or significant osseous findings. IMPRESSION: No renal calculi or obstructive changes are noted. No acute abnormality seen. Electronically Signed   By: Alcide Clever M.D.   On: 06/05/2023 03:45   DG Chest 2 View  Result Date: 06/05/2023 CLINICAL DATA:  Chest pain EXAM: CHEST - 2 VIEW COMPARISON:  03/05/2014 FINDINGS: The heart size and mediastinal contours are within normal limits. Both lungs are clear. The visualized skeletal structures are unremarkable. IMPRESSION: No active cardiopulmonary disease. Electronically Signed   By: Alcide Clever M.D.   On:  06/05/2023 03:36    Procedures Procedures    Medications Ordered in ED Medications - No data to display  ED Course/ Medical Decision Making/ A&P                             Medical Decision Making Amount and/or Complexity of Data Reviewed Labs: ordered. Decision-making details documented in ED Course. Radiology: ordered and independent interpretation performed. Decision-making details documented in ED Course. ECG/medicine tests: ordered and independent interpretation performed. Decision-making details documented in ED Course.  Risk Prescription drug management.   Right flank pain that radiates to the right abdomen.  No radiation of the pain down the leg.  No focal weakness, numbness  or tingling.  No bowel or bladder incontinence.  No fever or vomiting.  Low suspicion for cord compression or cauda equina  Urinalysis shows trace hemoglobin.  No evidence of UTI. Labs are reassuring.  LFTs and lipase are normal.  Previous cholecystectomy.  Chest x-ray negative for pneumonia  Pain has improved with treatment in the ED.  Suspect possible passed kidney stone versus shingles with rash not yet visible.  Low suspicion for cord compression or cauda equina.  Low suspicion of acute intra-abdominal surgical pathology.  CT scan is negative for acute abdominal pathology.  No evidence of bowel obstruction.  No evidence of cholecystitis or appendicitis.  No evidence of kidney stone.  Results reviewed interpreted by me.  No evidence of pneumonia or pulmonary embolism.  Treat supportively with anti-inflammatories.  Follow-up with PCP.  Return to the ED for worsening pain, focal weakness, numbness, tingling, bowel or bladder incontinence, chest pain, shortness of breath or any other concerns.        Final Clinical Impression(s) / ED Diagnoses Final diagnoses:  Flank pain    Rx / DC Orders ED Discharge Orders     None         Darrall Strey, Jeannett Senior, MD 06/05/23 [id number]

## 2023-06-05 NOTE — Discharge Instructions (Signed)
As we discussed, you may have passed a kidney stone.  No evidence of urinary tract infection.  No evidence of pneumonia or pulmonary embolism.  Take the anti-inflammatories as prescribed and follow-up with your primary doctor.  Return to the ED with worsening pain, fever, vomiting, not able to urinate or any other concerns.

## 2023-06-05 NOTE — ED Triage Notes (Signed)
Pt recently seen for same sx; pt has dark and cloudy urine per her report. Pt with RT flank pain, and RT back pain. Pt reports she had a urine cx on Monday and it was negative. Recently started on HTN meds.

## 2023-06-07 ENCOUNTER — Telehealth: Payer: Self-pay

## 2023-06-07 NOTE — Transitions of Care (Post Inpatient/ED Visit) (Signed)
   06/07/2023  Name: Meredith York MRN: [id number] DOB: 03-10-1976  Today's TOC FU Call Status: Today's TOC FU Call Status:: Successful TOC FU Call Competed TOC FU Call Complete Date: 06/07/23  Transition Care Management Follow-up Telephone Call Date of Discharge: 05/31/23 Discharge Facility: [facility] Type of Discharge: Emergency Department Reason for ED Visit: Other: How have you been since you were released from the hospital?: Better Any questions or concerns?: No  Items Reviewed: Did you receive and understand the discharge instructions provided?: Yes Medications obtained,verified, and reconciled?: Yes (Medications Reviewed) Any new allergies since your discharge?: No Dietary orders reviewed?: No Do you have support at home?: Yes People in Home: child(ren), adult  Medications Reviewed Today: Medications Reviewed Today     Reviewed by Howell Rucks, RN (Registered Nurse) on 06/05/23 at 0233  Med List Status: <None>   Medication Order Taking? Sig Documenting Provider Last Dose Status Informant  amLODipine (NORVASC) 5 MG tablet [id number]  Take 1 tablet (5 mg total) by mouth daily. Alvira Monday, MD  Active   celecoxib (CELEBREX) 200 MG capsule [id number]  Take 1 capsule (200 mg total) by mouth 2 (two) times daily as needed. One to 2 tablets by mouth daily as needed for pain. Myra Rude, MD  Active   hydrochlorothiazide (HYDRODIURIL) 25 MG tablet [id number]  Take 1 tablet (25 mg total) by mouth daily. [name], FNP  Active   ibuprofen (ADVIL) 800 MG tablet [id number]  Take 1 tablet (800 mg total) by mouth 3 (three) times daily. Terrilee Files, MD  Active   nitroGLYCERIN (NITRODUR - DOSED IN MG/24 HR) 0.2 mg/hr patch [id number]  Cut and apply 1/4 patch to most painful area q24h. Myra Rude, MD  Active   traMADol Janean Sark) 50 MG tablet [id number]  TAKE 1 TABLET(50 MG) BY MOUTH EVERY 6 HOURS AS NEEDED  Patient not taking: Reported on 03/26/2021   Eldred Manges, MD  Active   Vitamin D, Ergocalciferol, (DRISDOL) 1.25 MG (50000 UNIT) CAPS capsule [id number]  Take 1 capsule (50,000 Units total) by mouth 2 (two) times a week.  Patient not taking: Reported on 03/26/2021   [name], FNP  Active             Home Care and Equipment/Supplies: Were Home Health Services Ordered?: NA Any new equipment or medical supplies ordered?: NA  Functional Questionnaire: Do you need assistance with bathing/showering or dressing?: No Do you need assistance with meal preparation?: No Do you need assistance with eating?: No Do you have difficulty maintaining continence: No Do you need assistance with getting out of bed/getting out of a chair/moving?: No Do you have difficulty managing or taking your medications?: No  Follow up appointments reviewed: PCP Follow-up appointment confirmed?: Yes Date of PCP follow-up appointment?: 06/08/23 Follow-up Provider: Ellender Hose NP Specialist Hospital Follow-up appointment confirmed?: No Reason Specialist Follow-Up Not Confirmed: Appointment Sceduled by [facility] Calling Clinician Do you need transportation to your follow-up appointment?: No Do you understand care options if your condition(s) worsen?: Yes-patient verbalized understanding    SIGNATUREYL,RMA

## 2023-06-08 ENCOUNTER — Ambulatory Visit (INDEPENDENT_AMBULATORY_CARE_PROVIDER_SITE_OTHER): Payer: 59 | Admitting: Family Medicine

## 2023-06-08 ENCOUNTER — Encounter: Payer: Self-pay | Admitting: Physical Therapy

## 2023-06-08 ENCOUNTER — Encounter: Payer: Self-pay | Admitting: Family Medicine

## 2023-06-08 ENCOUNTER — Ambulatory Visit: Payer: 59 | Admitting: Physical Therapy

## 2023-06-08 VITALS — BP 130/88 | HR 100 | Temp 98.5°F | Ht 62.0 in | Wt 211.0 lb

## 2023-06-08 DIAGNOSIS — M6281 Muscle weakness (generalized): Secondary | ICD-10-CM

## 2023-06-08 DIAGNOSIS — Z1231 Encounter for screening mammogram for malignant neoplasm of breast: Secondary | ICD-10-CM

## 2023-06-08 DIAGNOSIS — I10 Essential (primary) hypertension: Secondary | ICD-10-CM

## 2023-06-08 DIAGNOSIS — M25512 Pain in left shoulder: Secondary | ICD-10-CM

## 2023-06-08 DIAGNOSIS — R7303 Prediabetes: Secondary | ICD-10-CM

## 2023-06-08 DIAGNOSIS — M25612 Stiffness of left shoulder, not elsewhere classified: Secondary | ICD-10-CM

## 2023-06-08 DIAGNOSIS — R10A0 Flank pain, unspecified side: Secondary | ICD-10-CM

## 2023-06-08 DIAGNOSIS — Z1211 Encounter for screening for malignant neoplasm of colon: Secondary | ICD-10-CM

## 2023-06-08 DIAGNOSIS — E66812 Obesity, class 2: Secondary | ICD-10-CM

## 2023-06-08 DIAGNOSIS — R109 Unspecified abdominal pain: Secondary | ICD-10-CM

## 2023-06-08 DIAGNOSIS — Z6838 Body mass index (BMI) 38.0-38.9, adult: Secondary | ICD-10-CM

## 2023-06-08 LAB — POCT URINALYSIS DIPSTICK
Bilirubin, UA: NEGATIVE
Glucose, UA: NEGATIVE
Ketones, UA: NEGATIVE
Leukocytes, UA: NEGATIVE
Nitrite, UA: NEGATIVE
Protein, UA: NEGATIVE
Spec Grav, UA: >=1.03 — AB (ref 1.010–1.025)
Urobilinogen, UA: 0.2 E.U./dL
pH, UA: 6.5 (ref 5.0–8.0)

## 2023-06-08 NOTE — Progress Notes (Unsigned)
I,Victoria T Hamilton, CMA,acting as a Neurosurgeon for [employer], NP.,have documented all relevant documentation on the behalf of Trevis Eden, NP,as directed by  Alisah Grandberry Moshe Salisbury, NP while in the presence of Juliany Daughety, NP.  Subjective:  Patient ID: Meredith York , female    DOB: 1976/09/13 , 47 y.o.   MRN: [id number]  Chief Complaint  Patient presents with   Follow-up    HPI  Patient presents today for ER follow up. She visited [place] ED on 6/15 for flank pain. Today she reports feeling fine.  She reports no specific questions or concerns.  She would like an referral sent to complete mammogram & colonoscopy.  She reports having an apt with GYN on 07/08/23 to complete pap.  She admits starting bp medication, Amlodipine 5MG  last Monday.       Past Medical History:  Diagnosis Date   Carpal tunnel syndrome on right    Chlamydia    Gestational diabetes mellitus    Hypertension    no medicaqtions at this time   Migraines    Ovarian cyst    S/P lumbar discectomy 04/10/2015   Tendonitis    right wrist   Trichomonas    Type 2 diabetes mellitus (HCC)    not currently taking medication     Family History  Problem Relation Age of Onset   Hypertension Maternal Aunt    Hypertension Paternal Aunt    Club foot Daughter    Hirschsprung's disease Daughter    Learning disabilities Daughter    Polydactyly Daughter    Hypertension Mother    Diabetes Mother    Heart disease Father    Anesthesia problems Neg Hx    Hypotension Neg Hx    Malignant hyperthermia Neg Hx    Pseudochol deficiency Neg Hx      Current Outpatient Medications:    amLODipine (NORVASC) 5 MG tablet, Take 1 tablet (5 mg total) by mouth daily., Disp: 90 tablet, Rfl: 1   hydrochlorothiazide (HYDRODIURIL) 25 MG tablet, Take 1 tablet (25 mg total) by mouth daily., Disp: 90 tablet, Rfl: 1   ibuprofen (ADVIL) 800 MG tablet, Take 1 tablet (800 mg total) by mouth 3 (three) times daily., Disp: 21 tablet, Rfl: 0   naproxen  (NAPROSYN) 500 MG tablet, Take 1 tablet (500 mg total) by mouth 2 (two) times daily as needed., Disp: 30 tablet, Rfl: 0   celecoxib (CELEBREX) 200 MG capsule, Take 1 capsule (200 mg total) by mouth 2 (two) times daily as needed. One to 2 tablets by mouth daily as needed for pain. (Patient not taking: Reported on 06/08/2023), Disp: 60 capsule, Rfl: 2   nitroGLYCERIN (NITRODUR - DOSED IN MG/24 HR) 0.2 mg/hr patch, Cut and apply 1/4 patch to most painful area q24h. (Patient not taking: Reported on 06/08/2023), Disp: 30 patch, Rfl: 11   traMADol (ULTRAM) 50 MG tablet, TAKE 1 TABLET(50 MG) BY MOUTH EVERY 6 HOURS AS NEEDED (Patient not taking: Reported on 03/26/2021), Disp: 30 tablet, Rfl: 0   Vitamin D, Ergocalciferol, (DRISDOL) 1.25 MG (50000 UNIT) CAPS capsule, Take 1 capsule (50,000 Units total) by mouth 2 (two) times a week. (Patient not taking: Reported on 03/26/2021), Disp: 24 capsule, Rfl: 1   Allergies  Allergen Reactions   Amoxicillin Hives and Itching    Has patient had a PCN reaction causing immediate rash, facial/tongue/throat swelling, SOB or lightheadedness with hypotension: No Has patient had a PCN reaction causing severe rash involving mucus membranes or skin necrosis: No Has  patient had a PCN reaction that required hospitalization No Has patient had a PCN reaction occurring within the last 10 years: No If all of the above answers are "NO", then may proceed with Cephalosporin use.    Percocet [Oxycodone-Acetaminophen] Itching   Gabapentin Rash    Made face break out     Review of Systems  Constitutional: Negative.   Respiratory: Negative.    Cardiovascular: Negative.   Neurological: Negative.   Psychiatric/Behavioral: Negative.       Today's Vitals   06/08/23 1512  BP: (!) 136/100  Pulse: 100  Temp: 98.5 F (36.9 C)  SpO2: 98%  Weight: 211 lb (95.7 kg)  Height: 5\' 2"  (1.575 m)   Body mass index is 38.59 kg/m.  Wt Readings from Last 3 Encounters:  06/08/23 211 lb (95.7  kg)  06/05/23 215 lb (97.5 kg)  03/02/23 193 lb (87.5 kg)    The 10-year ASCVD risk score (Arnett DK, et al., 2019) is: 16.6%   Values used to calculate the score:     Age: 46 years     Sex: Female     Is Non-Hispanic African American: Yes     Diabetic: Yes     Tobacco smoker: Yes     Systolic Blood Pressure: 136 mmHg     Is BP treated: Yes     HDL Cholesterol: 51 mg/dL     Total Cholesterol: 196 mg/dL  Objective:  Physical Exam Constitutional:      Appearance: Normal appearance.  Pulmonary:     Effort: Pulmonary effort is normal. No respiratory distress.  Neurological:     General: No focal deficit present.     Mental Status: She is alert and oriented to person, place, and time. Mental status is at baseline.  Psychiatric:        Mood and Affect: Mood normal.         Assessment And Plan:  1. Flank pain  2. Primary hypertension  3. Class 2 severe obesity due to excess calories with serious comorbidity and body mass index (BMI) of 38.0 to 38.9 in adult [facility]) She is encouraged to strive for BMI less than 30 to decrease cardiac risk. Advised to aim for at least 150 minutes of exercise per week.   Return for 4 month phys.  Patient was given opportunity to ask questions. Patient verbalized understanding of the plan and was able to repeat key elements of the plan. All questions were answered to their satisfaction.  Abby Tucholski Moshe Salisbury, NP  I, Takyia Sindt Moshe Salisbury, NP, have reviewed all documentation for this visit. The documentation on 06/08/23 for the exam, diagnosis, procedures, and orders are all accurate and complete.   IF YOU HAVE BEEN REFERRED TO A SPECIALIST, IT MAY TAKE 1-2 WEEKS TO SCHEDULE/PROCESS THE REFERRAL. IF YOU HAVE NOT HEARD FROM US/SPECIALIST IN TWO WEEKS, PLEASE GIVE [place] A CALL AT [phone].

## 2023-06-08 NOTE — Therapy (Signed)
OUTPATIENT PHYSICAL THERAPY TREATMENT NOTE Patient Name: Meredith York MRN: [id number] DOB:09-13-1976, 47 y.o., female Today's Date: 06/08/2023  PCP: [name], FNP   REFERRING PROVIDER: Jones Broom, MD      END OF SESSION:   PT End of Session - 06/08/23 0853     Visit Number 12    Number of Visits 17    Date for PT Re-Evaluation 06/25/23    Authorization Type [employer]; [place] [employer]     Authorization Time Period 5/29-9/26, 8 visits    Authorization - Visit Number 6    Authorization - Number of Visits 8    Progress Note Due on Visit 10    PT Start Time 0848    PT Stop Time 0930    PT Time Calculation (min) 42 min    Activity Tolerance Patient tolerated treatment well    Behavior During Therapy [facility] for tasks assessed/performed                  Past Medical History:  Diagnosis Date   Carpal tunnel syndrome on right    Chlamydia    Gestational diabetes mellitus    Hypertension    no medicaqtions at this time   Migraines    Ovarian cyst    S/P lumbar discectomy 04/10/2015   Tendonitis    right wrist   Trichomonas    Type 2 diabetes mellitus (HCC)    not currently taking medication   Past Surgical History:  Procedure Laterality Date   ABDOMINAL HYSTERECTOMY     BACK SURGERY     CESAREAN SECTION  12/27/2011   Procedure: CESAREAN SECTION;  Surgeon: Brock Bad, MD;  Location: [facility];  Service: Gynecology;  Laterality: N/A;  Primary cesarean section with delivery of baby girl at 25. Apgars 3/3/6   CESAREAN SECTION N/A 08/03/2016   Procedure: CESAREAN SECTION;  Surgeon: Tereso Newcomer, MD;  Location: [facility];  Service: Obstetrics;  Laterality: N/A;   CHOLECYSTECTOMY  02/2009   DILATION AND CURETTAGE OF UTERUS N/A 06/25/2014   Procedure: DILATATION AND CURETTAGE;  Surgeon: Reva Bores, MD;  Location: [facility];  Service: Gynecology;  Laterality: N/A;   LUMBAR LAMINECTOMY/DECOMPRESSION MICRODISCECTOMY Left  04/10/2015   Procedure: Left L5-S1 Microdiscectomy;  Surgeon: Eldred Manges, MD;  Location: [facility];  Service: Orthopedics;  Laterality: Left;   VAGINAL HYSTERECTOMY N/A 06/04/2020   Procedure: HYSTERECTOMY VAGINAL, MORCELLATION;  Surgeon: Hermina Staggers, MD;  Location: [facility];  Service: Gynecology;  Laterality: N/A;   WISDOM TOOTH EXTRACTION     Patient Active Problem List   Diagnosis Date Noted   Rotator cuff tear, left 01/18/2023   OA (osteoarthritis) of shoulder 12/03/2022   Peroneal tendon tear, left, subsequent encounter 01/17/2021   Recurrent herniation of lumbar disc 10/30/2020   Prediabetes 09/24/2020   Post-operative state 06/04/2020   Visit for routine gyn exam 11/29/2019   Encounter for screening mammogram for breast cancer 11/29/2019   History of bilateral tubal ligation 11/29/2019   S/P cesarean section 07/14/2016   Hypertension 07/05/2011    REFERRING DIAG: s/p left shoulder cuff /debridment ,SAD DCE 04/06/2023   THERAPY DIAG:  Acute pain of left shoulder  Muscle weakness (generalized)  Stiffness of left shoulder, not elsewhere classified  Rationale for Evaluation and Treatment Rehabilitation  ONSET DATE: 4/16 surgery; Initial issue approx 2 years   SUBJECTIVE:  SUBJECTIVE STATEMENT:  Pt reports pain today 4/10.  Still having difficulty with repetitive motions, lifting overhead  and does her HEP.  Hand dominance: Right   PAIN:  Are you having pain? Yes: NPRS scale: 4/10 Pain location: L GH area Pain description: ache, throb Aggravating factors: Certain movement Relieving factors: Rest, Ice packs  PERTINENT HISTORY: High BMI-heavy arm; HTN   PRECAUTIONS: None   WEIGHT BEARING RESTRICTIONS: No WBAT   FALLS:  Has patient fallen in last 6 months? 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 Partial-thickness midsubstance tear of the mid to anterior supraspinatus tendon footprint measuring up to 10 mm in AP dimension. 2. Moderate degenerative changes of the acromioclavicular joint. High-grade marrow edema within the clavicular head suggests this may represent a source of pain. 3. Mild glenohumeral cartilage degenerative changes.   PATIENT SURVEYS:  FOTO: Perceived function   65%, predicted   68%. 05/25/23=56%   COGNITION: Overall cognitive status: Within functional limits for tasks assessed                                  SENSATION: WFL   POSTURE: Forward ead, rounded shoulders   UPPER EXTREMITY ROM:    Active ROM Right eval Left eval LT 04/22/23 LT 04/28/23 LT 05/04/23 LT 05/06/23 LT 05/19/23 Lt 05/27/23 LT 06/03/23  Shoulder flexion 150 A62; P80 AA120 P125 P135  A120 P150 135 145  Shoulder extension             Shoulder abduction 154 A72; P85  P115 P120   125 135  Shoulder adduction             Shoulder internal rotation T4 A T1; P40  P 60 P65 P70  Left PSIS Reaches L5   Shoulder external rotation T7 A GT hip; P70  P 90 P90   Reaches back of neck T2  Elbow flexion             Elbow extension             Wrist flexion             Wrist extension             Wrist ulnar deviation             Wrist radial deviation             Wrist pronation             Wrist supination             (Blank rows = not tested)   UPPER EXTREMITY  MMT: L shoulder generally 2+ to 3_ MMT Right eval Left eval Lt  06/08/23  Shoulder flexion 5   4-  Shoulder extension 5     Shoulder abduction 5   3+ pain  Shoulder adduction 5     Shoulder internal rotation 5   4+  Shoulder external rotation 5  4  Middle trapezius       Lower trapezius       Elbow flexion     4+  Elbow extension       Wrist flexion       Wrist extension       Wrist ulnar deviation       Wrist radial deviation       Wrist pronation       Wrist supination       Grip strength (lbs)       (Blank rows = not tested)   SHOULDER SPECIAL TESTS: Impingement tests:  NT SLAP lesions:  NT Instability tests:  NT Rotator cuff assessment:  NT Biceps assessment:  NT   PALPATION:  TTP peri-GH jt area             TODAY'S TREATMENT:   [facility] Adult PT Treatment:                                                DATE: 06/08/23 Therapeutic Exercise: UBE for 6 min, 3 in each direction level 1  Supine horizontal pull red band x 2 x 10 -upgrade to green Diagonal pull red 2  x 10  ER/IR unattached red x 20  Side lying red band x 15 abduction, flexion x 15  ER 2 lbs x 10 x 2  Wall push ups red band x 10  Wall slides red band looped flexion for scapular stability  Wall taps, each UE with red looped band  Row double arm green TB x 15  Extension double arm TB  x 15  Manual Therapy: LUE PROM for assessment   [facility] Adult PT Treatment:                                                DATE: 06/03/23 Therapeutic Exercise: UBE Level 1 x 2 min each way  Pulleys  Row  10 x 2 red Shoulder ext 10 x 2 red Left shoulder IR YTB 10 x 2  Left shoulder ER YTB  10 x 2  UE ranger for flexion and scaption 2x10 Sleeper stretch 10 sec x 4  Supine shoulder flexion 2 x 10 1# Supine serratus press 2x10 2# S/L ER 1# x 10, 2# x 10  S/L abdct 1# x 10, 2# x 10 Passive IR   20 sec x 3 supine  Standing IR AAROM with dowel 10 x 2    [facility] Adult PT Treatment:                                                 DATE: 06/01/23 Therapeutic Exercise: UBE L1 2 mins FWD/BWD UE ranger for flexion and scaption 2x10 Sleeper stretch x3 30" Supine shoulder flexion 2x10 1# S/L shoulder ER 2x10 1# S/L shoulder abd 2x10 !# Prone Shoulder ext 10 x 2 1# Supine serratus press 2x10 2# Modalities Cold pack to the L sholder x 10 mins  [facility] Adult PT Treatment:  DATE: 05/27/23 Therapeutic Exercise: Pulleys  Row  10 x 2 red Shoulder ext 10 x 2 red Left IR YTB 10 x 2  Left ER YTB 10 x 2  Prone I and T 2 x 10 each  S/L abduction 1# x 10, AROM x 10  S/L 1# ER 2 x 10  Supine flexion 10 x 2  Supine serratus press 2x10 1# IR AAROm using opp UE to assist 10 sec x 2  Modalities Cold pack to the L sholder x 10 mins  [facility] Adult PT Treatment:                                                DATE: 05/25/23 Therapeutic Exercise: Pulleys for flexion and scaption S/L L shoulder ER 1# 2x10 S/L L shoulder abd 1# 2x10 Prone I's and Ts x10 Supine flexion x10 Supine serratus press 2x10 1# YTB shoulder ER 10x each YTB shoulder IR 10 x 2 each Modalities Cold pack to the L sholder x 10 mins  PATIENT EDUCATION: Education details: Eval findings, POC, HEP, self care  Person educated: Patient Education method: Explanation, Demonstration, Tactile cues, Verbal cues, and Handouts Education comprehension: verbalized understanding, returned demonstration, verbal cues required, and tactile cues required   HOME EXERCISE PROGRAM: Access Code: [email or web address] URL: [email or web address] Date: 06/01/2023 Prepared by: Joellyn Rued  Exercises - Seated Shoulder Flexion Towel Slide at Table Top Full Range of Motion  - 3 x daily - 7 x weekly - 1 sets - 15 reps - 3 hold - Seated Shoulder Scaption Slide at Table Top with Forearm in Neutral  - 3 x daily - 7 x weekly - 3 sets - 10 reps - 3 hold - Seated Shoulder External Rotation PROM on Table  - 3 x daily - 7 x weekly - 3 sets -  10 reps - 3 hold - Isometric Shoulder Flexion at Wall  - 1 x daily - 7 x weekly - 1 sets - 10 reps - 5 hold - Isometric Shoulder Extension at Wall  - 1 x daily - 7 x weekly - 1 sets - 10 reps - 5 hold - Standing Isometric Shoulder External Rotation with Doorway  - 1 x daily - 7 x weekly - 1 sets - 10 reps - 5 hold - Standing Isometric Shoulder Internal Rotation with Towel Roll at Doorway  - 1 x daily - 7 x weekly - 1 sets - 10 reps - 5 hold - Isometric Shoulder Abduction at Wall  - 1 x daily - 7 x weekly - 1 sets - 10 reps - 5 hold - Standing Shoulder Row with Anchored Resistance  - 1 x daily - 7 x weekly - 2 sets - 10 reps - 3 hold - Shoulder Extension with Resistance  - 1 x daily - 7 x weekly - 3 sets - 10 reps - 3 hold - Sidelying Shoulder External Rotation  - 1 x daily - 7 x weekly - 2 sets - 10 reps - Supine Shoulder Press  - 1 x daily - 7 x weekly - 2 sets - 10 reps - 3 hold - Sidelying Shoulder Abduction Palm Forward  - 1 x daily - 7 x weekly - 2 sets - 10 reps - 3 hold - Shoulder Flexion Wall Slide with Towel  - 1 x daily - 7 x weekly -  1-2 sets - 10 reps - Sleeper Stretch  - 1 x daily - 7 x weekly - 1 sets - 3 reps - 30 hold   ASSESSMENT:   CLINICAL IMPRESSION: Patient able to upgrade her band exercises to green.  She feels increased pain and weakness around rep 6-7 with her exercises but overall tolerates well. Cont POC.    OBJECTIVE IMPAIRMENTS: decreased ROM, decreased strength, impaired UE functional use, obesity, and pain.    ACTIVITY LIMITATIONS: carrying, lifting, dressing, reach over head, and caring for others   PARTICIPATION LIMITATIONS: meal prep, cleaning, laundry, driving, shopping, and occupation   PERSONAL FACTORS: Fitness, Past/current experiences, Time since onset of injury/illness/exacerbation, and 1 comorbidity: igh BMI-heavy arm  are also affecting patient's functional outcome.    REHAB POTENTIAL: Good   CLINICAL DECISION MAKING: Evolving/moderate  complexity   EVALUATION COMPLEXITY: Moderate     GOALS:   SHORT TERM GOALS: Target date: 05/07/23   Pt will be Ind in an initial HEP Baseline: started Goal status: MET   2.  Pt will voice understanding of measures to assist in pain reduction  Baseline: started 05/04/23: able to voice  Goal status: MET   3.  Increase L shoulder PROM: flexion- 120, abd- 120d, ER- 60, IR 80d Baseline: see flow sheet 05/04/23: PROM: flexion- 120, abd- 120d, ER- 90, IR 65d Goal status: PARTIALLY MET   LONG TERM GOALS: Target date: 06/25/23   Pt will be Ind in a final HEP to maintain achieved LOF Baseline: started Goal status: Ongoing   2.  Increase L shoulder AROM to within 90% of the R for improved functional use of the L UE Baseline: see flow sheets Goal status: Ongoing   3.  Increase L shoulder strength to 4+/5 or greater for improved functional use of the L UE Baseline: 2+ to 3-/5 Goal status: Ongoing   4.  Pt will be able to lift 2lbs x10 reps above shoulder height to reflect demands consistent with her work Baseline: NT  Goal status: Ongoing   5.  Pt will be able to lift 10lbs x10 reps 2 handed form knee to chest height to reflect demands consistent with her work Baseline: NT Goal status: Ongoing     PLAN:   PT FREQUENCY: 2x/week   PT DURATION: 8 weeks   PLANNED INTERVENTIONS: Therapeutic exercises, Therapeutic activity, Patient/Family education, Self Care, Joint mobilization, Dry Needling, Cryotherapy, Moist heat, Taping, Vasopneumatic device, Traction, Ionotophoresis 4mg /ml Dexamethasone, Manual therapy, and Re-evaluation   PLAN FOR NEXT SESSION: Review FOTO; assess response to HEP; progress therex as indicated (isometric and periscapular strengthening); use of modalities, manual therapy; and TPDN as indicated. (S/P debridement Surgery 04/06/23)  Karie Mainland, PT 06/08/23 9:11 AM Phone: [phone] Fax: [phone]

## 2023-06-08 NOTE — Patient Instructions (Signed)
Hypertension, Adult Hypertension is another name for high blood pressure. High blood pressure forces your heart to work harder to pump blood. This can cause problems over time. There are two numbers in a blood pressure reading. There is a top number (systolic) over a bottom number (diastolic). It is best to have a blood pressure that is below 120/80. What are the causes? The cause of this condition is not known. Some other conditions can lead to high blood pressure. What increases the risk? Some lifestyle factors can make you more likely to develop high blood pressure: Smoking. Not getting enough exercise or physical activity. Being overweight. Having too much fat, sugar, calories, or salt (sodium) in your diet. Drinking too much alcohol. Other risk factors include: Having any of these conditions: Heart disease. Diabetes. High cholesterol. Kidney disease. Obstructive sleep apnea. Having a family history of high blood pressure and high cholesterol. Age. The risk increases with age. Stress. What are the signs or symptoms? High blood pressure may not cause symptoms. Very high blood pressure (hypertensive crisis) may cause: Headache. Fast or uneven heartbeats (palpitations). Shortness of breath. Nosebleed. Vomiting or feeling like you may vomit (nauseous). Changes in how you see. Very bad chest pain. Feeling dizzy. Seizures. How is this treated? This condition is treated by making healthy lifestyle changes, such as: Eating healthy foods. Exercising more. Drinking less alcohol. Your doctor may prescribe medicine if lifestyle changes do not help enough and if: Your top number is above 130. Your bottom number is above 80. Your personal target blood pressure may vary. Follow these instructions at home: Eating and drinking  If told, follow the DASH eating plan. To follow this plan: Fill one half of your plate at each meal with fruits and vegetables. Fill one fourth of your plate  at each meal with whole grains. Whole grains include whole-wheat pasta, brown rice, and whole-grain bread. Eat or drink low-fat dairy products, such as skim milk or low-fat yogurt. Fill one fourth of your plate at each meal with low-fat (lean) proteins. Low-fat proteins include fish, chicken without skin, eggs, beans, and tofu. Avoid fatty meat, cured and processed meat, or chicken with skin. Avoid pre-made or processed food. Limit the amount of salt in your diet to less than 1,500 mg each day. Do not drink alcohol if: Your doctor tells you not to drink. You are pregnant, may be pregnant, or are planning to become pregnant. If you drink alcohol: Limit how much you have to: 0-1 drink a day for women. 0-2 drinks a day for men. Know how much alcohol is in your drink. In the [place] one drink equals one 12 oz bottle of beer (355 mL), one 5 oz glass of wine (148 mL), or one 1 oz glass of hard liquor (44 mL). Lifestyle  Work with your doctor to stay at a healthy weight or to lose weight. Ask your doctor what the best weight is for you. Get at least 30 minutes of exercise that causes your heart to beat faster (aerobic exercise) most days of the week. This may include walking, swimming, or biking. Get at least 30 minutes of exercise that strengthens your muscles (resistance exercise) at least 3 days a week. This may include lifting weights or doing Pilates. Do not smoke or use any products that contain nicotine or tobacco. If you need help quitting, ask your doctor. Check your blood pressure at home as told by your doctor. Keep all follow-up visits. Medicines Take over-the-counter and prescription medicines   only as told by your doctor. Follow directions carefully. Do not skip doses of blood pressure medicine. The medicine does not work as well if you skip doses. Skipping doses also puts you at risk for problems. Ask your doctor about side effects or reactions to medicines that you should watch  for. Contact a doctor if: You think you are having a reaction to the medicine you are taking. You have headaches that keep coming back. You feel dizzy. You have swelling in your ankles. You have trouble with your vision. Get help right away if: You get a very bad headache. You start to feel mixed up (confused). You feel weak or numb. You feel faint. You have very bad pain in your: Chest. Belly (abdomen). You vomit more than once. You have trouble breathing. These symptoms may be an emergency. Get help right away. Call 911. Do not wait to see if the symptoms will go away. Do not drive yourself to the hospital. Summary Hypertension is another name for high blood pressure. High blood pressure forces your heart to work harder to pump blood. For most people, a normal blood pressure is less than 120/80. Making healthy choices can help lower blood pressure. If your blood pressure does not get lower with healthy choices, you may need to take medicine. This information is not intended to replace advice given to you by your health care provider. Make sure you discuss any questions you have with your health care provider. Document Revised: 09/25/2021 Document Reviewed: 09/25/2021 Elsevier Patient Education  2024 [employer]  

## 2023-06-09 LAB — LIPID PANEL
Chol/HDL Ratio: 4.2 ratio (ref 0.0–4.4)
Cholesterol, Total: 207 mg/dL — ABNORMAL HIGH (ref 100–199)
HDL: 49 mg/dL (ref >39)
LDL Chol Calc (NIH): 130 mg/dL — ABNORMAL HIGH (ref 0–99)
Triglycerides: 155 mg/dL — ABNORMAL HIGH (ref 0–149)
VLDL Cholesterol Cal: 28 mg/dL (ref 5–40)

## 2023-06-09 LAB — MICROALBUMIN / CREATININE URINE RATIO
Creatinine, Urine: 237.4 mg/dL
Microalb/Creat Ratio: 8 mg/g creat (ref 0–29)
Microalbumin, Urine: 19.1 ug/mL

## 2023-06-09 LAB — HEMOGLOBIN A1C
Est. average glucose Bld gHb Est-mCnc: 143 mg/dL
Hgb A1c MFr Bld: 6.6 % — ABNORMAL HIGH (ref 4.8–5.6)

## 2023-06-09 NOTE — Therapy (Unsigned)
OUTPATIENT PHYSICAL THERAPY TREATMENT NOTE Patient Name: Meredith York MRN: [id number] DOB:[birth year], 47 y.o., female Today's Date: 06/10/2023  PCP: [name], FNP   REFERRING PROVIDER: Jones Broom, MD      END OF SESSION:   PT End of Session - 06/10/23 0851     Visit Number 13    Number of Visits 17    Date for PT Re-Evaluation 06/25/23    Authorization Type [employer]; [place] [employer] [place]    Authorization Time Period 5/29-9/26, 8 visits    Authorization - Visit Number 7    Authorization - Number of Visits 8    Progress Note Due on Visit 10    PT Start Time 0848    PT Stop Time 0930    PT Time Calculation (min) 42 min    Activity Tolerance Patient tolerated treatment well    Behavior During Therapy [facility] for tasks assessed/performed                   Past Medical History:  Diagnosis Date   Carpal tunnel syndrome on right    Chlamydia    Gestational diabetes mellitus    Hypertension    no medicaqtions at this time   Migraines    Ovarian cyst    S/P lumbar discectomy 04/10/2015   Tendonitis    right wrist   Trichomonas    Type 2 diabetes mellitus (HCC)    not currently taking medication   Past Surgical History:  Procedure Laterality Date   ABDOMINAL HYSTERECTOMY     BACK SURGERY     CESAREAN SECTION  12/27/2011   Procedure: CESAREAN SECTION;  Surgeon: Brock Bad, MD;  Location: [facility];  Service: Gynecology;  Laterality: N/A;  Primary cesarean section with delivery of baby girl at 64. Apgars 3/3/6   CESAREAN SECTION N/A 08/03/2016   Procedure: CESAREAN SECTION;  Surgeon: Tereso Newcomer, MD;  Location: [facility];  Service: Obstetrics;  Laterality: N/A;   CHOLECYSTECTOMY  02/2009   DILATION AND CURETTAGE OF UTERUS N/A 06/25/2014   Procedure: DILATATION AND CURETTAGE;  Surgeon: Reva Bores, MD;  Location: [facility];  Service: Gynecology;  Laterality: N/A;   LUMBAR LAMINECTOMY/DECOMPRESSION MICRODISCECTOMY Left  04/10/2015   Procedure: Left L5-S1 Microdiscectomy;  Surgeon: Eldred Manges, MD;  Location: [facility];  Service: Orthopedics;  Laterality: Left;   VAGINAL HYSTERECTOMY N/A 06/04/2020   Procedure: HYSTERECTOMY VAGINAL, MORCELLATION;  Surgeon: Hermina Staggers, MD;  Location: [facility];  Service: Gynecology;  Laterality: N/A;   WISDOM TOOTH EXTRACTION     Patient Active Problem List   Diagnosis Date Noted   Flank pain 06/10/2023   Rotator cuff tear, left 01/18/2023   OA (osteoarthritis) of shoulder 12/03/2022   Peroneal tendon tear, left, subsequent encounter 01/17/2021   Recurrent herniation of lumbar disc 10/30/2020   Prediabetes 09/24/2020   Post-operative state 06/04/2020   Visit for routine gyn exam 11/29/2019   Encounter for screening mammogram for breast cancer 11/29/2019   History of bilateral tubal ligation 11/29/2019   S/P cesarean section 07/14/2016   Hypertension 07/05/2011    REFERRING DIAG: s/p left shoulder cuff /debridment ,SAD DCE 04/06/2023   THERAPY DIAG:  Acute pain of left shoulder  Muscle weakness (generalized)  Stiffness of left shoulder, not elsewhere classified  Rationale for Evaluation and Treatment Rehabilitation  ONSET DATE: 4/16 surgery; Initial issue approx 2 years   SUBJECTIVE:  SUBJECTIVE STATEMENT:  Pain today 4/10 , sore after last session.     PAIN:  Are you having pain? Yes: NPRS scale: 4/10 Pain location: L GH area Pain description: ache, throb Aggravating factors: Certain movement Relieving factors: Rest, Ice packs  PERTINENT HISTORY: High BMI-heavy arm; HTN   PRECAUTIONS: None   WEIGHT BEARING RESTRICTIONS: No WBAT   FALLS:  Has patient fallen in last 6 months? 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 Partial-thickness midsubstance tear of the mid to anterior supraspinatus tendon footprint measuring up to 10 mm in AP dimension. 2. Moderate degenerative changes of the acromioclavicular joint. High-grade marrow edema within the clavicular head suggests this may represent a source of pain. 3. Mild glenohumeral cartilage degenerative changes.   PATIENT SURVEYS:  FOTO: Perceived function   65%, predicted   68%. 05/25/23=56%   COGNITION: Overall cognitive status: Within functional limits for tasks assessed                                  SENSATION: WFL   POSTURE: Forward ead, rounded shoulders   UPPER EXTREMITY ROM:    Active ROM Right eval Left eval LT 04/22/23 LT 04/28/23 LT 05/04/23 LT 05/06/23 LT 05/19/23 Lt 05/27/23 LT 06/03/23  Shoulder flexion 150 A62; P80 AA120 P125 P135  A120 P150 135 145  Shoulder extension             Shoulder abduction 154 A72; P85  P115 P120   125 135  Shoulder adduction             Shoulder internal rotation T4 A T1; P40  P 60 P65 P70  Left PSIS Reaches L5   Shoulder external rotation T7 A GT hip; P70  P 90 P90   Reaches back of neck T2  Elbow flexion             Elbow extension             Wrist flexion             Wrist extension             Wrist ulnar deviation             Wrist radial deviation             Wrist pronation             Wrist supination             (Blank rows = not tested)   UPPER EXTREMITY MMT: L shoulder generally 2+ to 3_ MMT Right eval Left eval Lt   06/08/23  Shoulder flexion 5   4-  Shoulder extension 5     Shoulder abduction 5   3+ pain  Shoulder adduction 5     Shoulder internal rotation 5   4+  Shoulder external rotation 5   4  Middle trapezius       Lower trapezius  Elbow flexion     4+  Elbow extension       Wrist flexion       Wrist extension       Wrist ulnar deviation       Wrist radial deviation       Wrist pronation       Wrist supination       Grip strength (lbs)       (Blank rows = not tested)   SHOULDER SPECIAL TESTS: Impingement tests:  NT SLAP lesions:  NT Instability tests:  NT Rotator cuff assessment:  NT Biceps assessment:  NT   PALPATION:  TTP peri-GH jt area             TODAY'S TREATMENT:  [facility] Adult PT Treatment:                                                DATE: 06/10/23 Therapeutic Exercise: UBE 6 min L2  Row, extension x 20 blue band  Diagonal pull and horizontal abd red band x 15  ER x 15 green band  ER iso with sidestepping  High plank on mat table: alt UE lift, then added opposite LE Seated ER 3 lbs 2 x 10  Seated 90 deg Abd 3 lbs (elbow flexed) x 10 hover off mat 5 sec  Facing mat 2 lbs shoulder flexion  x 10 (hover) 90 deg   Supine dowel AAROM all planes   [facility] Adult PT Treatment:                                                DATE: 06/08/23 Therapeutic Exercise: UBE for 6 min, 3 in each direction level 1  Supine horizontal pull red band x 2 x 10 -upgrade to green Diagonal pull red 2  x 10  ER/IR unattached red x 20  Side lying red band x 15 abduction, flexion x 15  ER 2 lbs x 10 x 2  Wall push ups red band x 10  Wall slides red band looped flexion for scapular stability  Wall taps, each UE with red looped band  Row double arm green TB x 15  Extension double arm TB  x 15  Manual Therapy: LUE PROM for assessment   [facility] Adult PT Treatment:                                                DATE: 06/03/23 Therapeutic Exercise: UBE Level 1 x 2 min each way  Pulleys  Row   10 x 2 red Shoulder ext 10 x 2 red Left shoulder IR YTB 10 x 2  Left shoulder ER YTB  10 x 2  UE ranger for flexion and scaption 2x10 Sleeper stretch 10 sec x 4  Supine shoulder flexion 2 x 10 1# Supine serratus press 2x10 2# S/L ER 1# x 10, 2# x 10  S/L abdct 1# x 10, 2# x 10 Passive IR   20 sec x 3 supine  Standing IR AAROM with dowel 10 x 2    [facility] Adult PT Treatment:  DATE: 06/01/23 Therapeutic Exercise: UBE L1 2 mins FWD/BWD UE ranger for flexion and scaption 2x10 Sleeper stretch x3 30" Supine shoulder flexion 2x10 1# S/L shoulder ER 2x10 1# S/L shoulder abd 2x10 !# Prone Shoulder ext 10 x 2 1# Supine serratus press 2x10 2# Modalities Cold pack to the L sholder x 10 mins  [facility] Adult PT Treatment:                                                DATE: 05/27/23 Therapeutic Exercise: Pulleys  Row  10 x 2 red Shoulder ext 10 x 2 red Left IR YTB 10 x 2  Left ER YTB 10 x 2  Prone I and T 2 x 10 each  S/L abduction 1# x 10, AROM x 10  S/L 1# ER 2 x 10  Supine flexion 10 x 2  Supine serratus press 2x10 1# IR AAROm using opp UE to assist 10 sec x 2  Modalities Cold pack to the L sholder x 10 mins  [facility] Adult PT Treatment:                                                DATE: 05/25/23 Therapeutic Exercise: Pulleys for flexion and scaption S/L L shoulder ER 1# 2x10 S/L L shoulder abd 1# 2x10 Prone I's and Ts x10 Supine flexion x10 Supine serratus press 2x10 1# YTB shoulder ER 10x each YTB shoulder IR 10 x 2 each Modalities Cold pack to the L sholder x 10 mins  PATIENT EDUCATION: Education details: Eval findings, POC, HEP, self care  Person educated: Patient Education method: Explanation, Demonstration, Tactile cues, Verbal cues, and Handouts Education comprehension: verbalized understanding, returned demonstration, verbal cues required, and tactile cues required   HOME EXERCISE PROGRAM: Access Code: [email or web address] URL:  [email or web address] Date: 06/01/2023 Prepared by: Joellyn Rued  Exercises - Seated Shoulder Flexion Towel Slide at Table Top Full Range of Motion  - 3 x daily - 7 x weekly - 1 sets - 15 reps - 3 hold - Seated Shoulder Scaption Slide at Table Top with Forearm in Neutral  - 3 x daily - 7 x weekly - 3 sets - 10 reps - 3 hold - Seated Shoulder External Rotation PROM on Table  - 3 x daily - 7 x weekly - 3 sets - 10 reps - 3 hold - Isometric Shoulder Flexion at Wall  - 1 x daily - 7 x weekly - 1 sets - 10 reps - 5 hold - Isometric Shoulder Extension at Wall  - 1 x daily - 7 x weekly - 1 sets - 10 reps - 5 hold - Standing Isometric Shoulder External Rotation with Doorway  - 1 x daily - 7 x weekly - 1 sets - 10 reps - 5 hold - Standing Isometric Shoulder Internal Rotation with Towel Roll at Doorway  - 1 x daily - 7 x weekly - 1 sets - 10 reps - 5 hold - Isometric Shoulder Abduction at Wall  - 1 x daily - 7 x weekly - 1 sets - 10 reps - 5 hold - Standing Shoulder Row with Anchored Resistance  - 1 x daily - 7 x weekly - 2 sets - 10  reps - 3 hold - Shoulder Extension with Resistance  - 1 x daily - 7 x weekly - 3 sets - 10 reps - 3 hold - Sidelying Shoulder External Rotation  - 1 x daily - 7 x weekly - 2 sets - 10 reps - Supine Shoulder Press  - 1 x daily - 7 x weekly - 2 sets - 10 reps - 3 hold - Sidelying Shoulder Abduction Palm Forward  - 1 x daily - 7 x weekly - 2 sets - 10 reps - 3 hold - Shoulder Flexion Wall Slide with Towel  - 1 x daily - 7 x weekly - 1-2 sets - 10 reps - Sleeper Stretch  - 1 x daily - 7 x weekly - 1 sets - 3 reps - 30 hold   ASSESSMENT:   CLINICAL IMPRESSION: Focus on strengthening today for L UE.  She needed mod cues for plank from high mat table.  No increased pain during session.  She is working towards her goals, improving function of L UE for work tasks.     OBJECTIVE IMPAIRMENTS: decreased ROM, decreased strength, impaired UE functional use, obesity, and  pain.    ACTIVITY LIMITATIONS: carrying, lifting, dressing, reach over head, and caring for others   PARTICIPATION LIMITATIONS: meal prep, cleaning, laundry, driving, shopping, and occupation   PERSONAL FACTORS: Fitness, Past/current experiences, Time since onset of injury/illness/exacerbation, and 1 comorbidity: igh BMI-heavy arm  are also affecting patient's functional outcome.    REHAB POTENTIAL: Good   CLINICAL DECISION MAKING: Evolving/moderate complexity   EVALUATION COMPLEXITY: Moderate     GOALS:   SHORT TERM GOALS: Target date: 05/07/23   Pt will be Ind in an initial HEP Baseline: started Goal status: MET   2.  Pt will voice understanding of measures to assist in pain reduction  Baseline: started 05/04/23: able to voice  Goal status: MET   3.  Increase L shoulder PROM: flexion- 120, abd- 120d, ER- 60, IR 80d Baseline: see flow sheet 05/04/23: PROM: flexion- 120, abd- 120d, ER- 90, IR 65d Goal status: PARTIALLY MET   LONG TERM GOALS: Target date: 06/25/23   Pt will be Ind in a final HEP to maintain achieved LOF Baseline: started Goal status: Ongoing   2.  Increase L shoulder AROM to within 90% of the R for improved functional use of the L UE Baseline: see flow sheets Goal status: Ongoing   3.  Increase L shoulder strength to 4+/5 or greater for improved functional use of the L UE Baseline: 2+ to 3-/5 Goal status: Ongoing   4.  Pt will be able to lift 2lbs x10 reps above shoulder height to reflect demands consistent with her work Baseline: NT  Goal status: Ongoing   5.  Pt will be able to lift 10lbs x10 reps 2 handed form knee to chest height to reflect demands consistent with her work Baseline: NT Goal status: Ongoing     PLAN:   PT FREQUENCY: 2x/week   PT DURATION: 8 weeks   PLANNED INTERVENTIONS: Therapeutic exercises, Therapeutic activity, Patient/Family education, Self Care, Joint mobilization, Dry Needling, Cryotherapy, Moist heat, Taping,  Vasopneumatic device, Traction, Ionotophoresis 4mg /ml Dexamethasone, Manual therapy, and Re-evaluation   PLAN FOR NEXT SESSION: Review FOTO; assess response to HEP; progress therex as indicated (isometric and periscapular strengthening); use of modalities, manual therapy; and TPDN as indicated. (S/P debridement Surgery 04/06/23)  Karie Mainland, PT 06/10/23 11:25 AM Phone: [phone] Fax: [phone]

## 2023-06-10 ENCOUNTER — Encounter: Payer: Self-pay | Admitting: Physical Therapy

## 2023-06-10 ENCOUNTER — Ambulatory Visit: Payer: 59 | Admitting: Physical Therapy

## 2023-06-10 DIAGNOSIS — R109 Unspecified abdominal pain: Secondary | ICD-10-CM | POA: Insufficient documentation

## 2023-06-10 DIAGNOSIS — M6281 Muscle weakness (generalized): Secondary | ICD-10-CM

## 2023-06-10 DIAGNOSIS — M25612 Stiffness of left shoulder, not elsewhere classified: Secondary | ICD-10-CM

## 2023-06-10 DIAGNOSIS — M25512 Pain in left shoulder: Secondary | ICD-10-CM | POA: Diagnosis not present

## 2023-06-10 NOTE — Assessment & Plan Note (Signed)
Continue Current treatment of Amlodipine 5 mg Q D

## 2023-06-10 NOTE — Assessment & Plan Note (Signed)
Resolved

## 2023-06-10 NOTE — Assessment & Plan Note (Signed)
A1c checked. 

## 2023-06-14 NOTE — Therapy (Unsigned)
OUTPATIENT PHYSICAL THERAPY TREATMENT NOTE Patient Name: Meredith York MRN: [id number] DOB:July 14, 1976, 47 y.o., female Today's Date: 06/15/2023  PCP: [name], FNP   REFERRING PROVIDER: Jones Broom, MD      END OF SESSION:   PT End of Session - 06/15/23 0851     Visit Number 14    Number of Visits 17    Date for PT Re-Evaluation 06/25/23    Authorization Time Period 5/29-9/26, 8 visits    Authorization - Visit Number 8    Authorization - Number of Visits 8    PT Start Time 0847    PT Stop Time 0930    PT Time Calculation (min) 43 min    Activity Tolerance Patient tolerated treatment well    Behavior During Therapy [facility] for tasks assessed/performed                    Past Medical History:  Diagnosis Date   Carpal tunnel syndrome on right    Chlamydia    Gestational diabetes mellitus    Hypertension    no medicaqtions at this time   Migraines    Ovarian cyst    S/P lumbar discectomy 04/10/2015   Tendonitis    right wrist   Trichomonas    Type 2 diabetes mellitus (HCC)    not currently taking medication   Past Surgical History:  Procedure Laterality Date   ABDOMINAL HYSTERECTOMY     BACK SURGERY     CESAREAN SECTION  12/27/2011   Procedure: CESAREAN SECTION;  Surgeon: Brock Bad, MD;  Location: [facility];  Service: Gynecology;  Laterality: N/A;  Primary cesarean section with delivery of baby girl at 43. Apgars 3/3/6   CESAREAN SECTION N/A 08/03/2016   Procedure: CESAREAN SECTION;  Surgeon: Tereso Newcomer, MD;  Location: [facility];  Service: Obstetrics;  Laterality: N/A;   CHOLECYSTECTOMY  02/2009   DILATION AND CURETTAGE OF UTERUS N/A 06/25/2014   Procedure: DILATATION AND CURETTAGE;  Surgeon: Reva Bores, MD;  Location: [facility];  Service: Gynecology;  Laterality: N/A;   LUMBAR LAMINECTOMY/DECOMPRESSION MICRODISCECTOMY Left 04/10/2015   Procedure: Left L5-S1 Microdiscectomy;  Surgeon: Eldred Manges, MD;  Location: [facility];  Service:  Orthopedics;  Laterality: Left;   VAGINAL HYSTERECTOMY N/A 06/04/2020   Procedure: HYSTERECTOMY VAGINAL, MORCELLATION;  Surgeon: Hermina Staggers, MD;  Location: [facility];  Service: Gynecology;  Laterality: N/A;   WISDOM TOOTH EXTRACTION     Patient Active Problem List   Diagnosis Date Noted   Flank pain 06/10/2023   Rotator cuff tear, left 01/18/2023   OA (osteoarthritis) of shoulder 12/03/2022   Peroneal tendon tear, left, subsequent encounter 01/17/2021   Recurrent herniation of lumbar disc 10/30/2020   Prediabetes 09/24/2020   Post-operative state 06/04/2020   Visit for routine gyn exam 11/29/2019   Encounter for screening mammogram for breast cancer 11/29/2019   History of bilateral tubal ligation 11/29/2019   S/P cesarean section 07/14/2016   Hypertension 07/05/2011    REFERRING DIAG: s/p left shoulder cuff /debridment ,SAD DCE 04/06/2023   THERAPY DIAG:  Acute pain of left shoulder  Muscle weakness (generalized)  Stiffness of left shoulder, not elsewhere classified  Chronic left shoulder pain  Nontraumatic incomplete tear of left rotator cuff  Rationale for Evaluation and Treatment Rehabilitation  ONSET DATE: 4/16 surgery; Initial issue approx 2 years   SUBJECTIVE:  SUBJECTIVE STATEMENT:  Pain today 4/10 , sore after last session.  I still don't feel comfortable lifting > 5-10 lbs.  I see Dr. Ave Filter 06/21/23.  Is this my last visit?    PAIN:  Are you having pain? Yes: NPRS scale: 4/10 Pain location: L GH area Pain description: ache, throb Aggravating factors: Certain movement Relieving factors: Rest, Ice packs  PERTINENT HISTORY: High BMI-heavy arm; HTN   PRECAUTIONS: None   WEIGHT BEARING RESTRICTIONS: No WBAT   FALLS:  Has patient fallen in last 6 months?  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 Partial-thickness midsubstance tear of the mid to anterior supraspinatus tendon footprint measuring up to 10 mm in AP dimension. 2. Moderate degenerative changes of the acromioclavicular joint. High-grade marrow edema within the clavicular head suggests this may represent a source of pain. 3. Mild glenohumeral cartilage degenerative changes.   PATIENT SURVEYS:  FOTO: Perceived function   65%, predicted   68%. 05/25/23=56%   COGNITION: Overall cognitive status: Within functional limits for tasks assessed                                  SENSATION: WFL   POSTURE: Forward ead, rounded shoulders   UPPER EXTREMITY ROM:    Active ROM Right eval Left eval LT 04/22/23 LT 04/28/23 LT 05/04/23 LT 05/06/23 LT 05/19/23 Lt 05/27/23 LT 06/03/23  Shoulder flexion 150 A62; P80 AA120 P125 P135  A120 P150 135 145  Shoulder extension             Shoulder abduction 154 A72; P85  P115 P120   125 135  Shoulder adduction             Shoulder internal rotation T4 A T1; P40  P 60 P65 P70  Left PSIS Reaches L5   Shoulder external rotation T7 A GT hip; P70  P 90 P90   Reaches back of neck T2  Elbow flexion             Elbow extension             Wrist flexion             Wrist extension             Wrist ulnar deviation             Wrist radial deviation             Wrist pronation             Wrist supination             (Blank rows = not tested)   UPPER  EXTREMITY MMT: L shoulder generally 2+ to 3_ MMT Right eval Left eval Lt  06/08/23  Shoulder flexion 5   4-  Shoulder extension 5     Shoulder abduction 5   3+ pain  Shoulder adduction 5     Shoulder internal rotation 5  4+  Shoulder external rotation 5   4  Middle trapezius       Lower trapezius       Elbow flexion     4+  Elbow extension       Wrist flexion       Wrist extension       Wrist ulnar deviation       Wrist radial deviation       Wrist pronation       Wrist supination       Grip strength (lbs)       (Blank rows = not tested)   SHOULDER SPECIAL TESTS: Impingement tests:  NT SLAP lesions:  NT Instability tests:  NT Rotator cuff assessment:  NT Biceps assessment:  NT   PALPATION:  TTP peri-GH jt area     TODAY'S TREATMENT:   [facility] Adult PT Treatment:                                                DATE: 06/15/23 Therapeutic Exercise: NuStep  Bicep curls 5 lbs x 20  Scaption and forward raise 3 lbs x 12 Horizontal pull green band x 15 Supine ER 2 x 30 reps, green band , 0 deg and 90 deg abd IR at 90 deg x 30 Diagonal pull green band x 15   4 lbs chest press x 10 added protraction x 10  Quadruped UE lift alternating x 8, added opposite leg (bird dog ) x 8  Standing serratus push green band on wall X 15 , then protraction  Single arm overhead press 2 lbs x 10 reps Towel for Internal rotation x 5 to L3     [facility] Adult PT Treatment:                                                DATE: 06/10/23 Therapeutic Exercise: UBE 6 min L2  Row, extension x 20 blue band  Diagonal pull and horizontal abd red band x 15  ER x 15 green band  ER iso with sidestepping  High plank on mat table: alt UE lift, then added opposite LE Seated ER 3 lbs 2 x 10  Seated 90 deg Abd 3 lbs (elbow flexed) x 10 hover off mat 5 sec  Facing mat 2 lbs shoulder flexion  x 10 (hover) 90 deg   Supine dowel AAROM all planes   [facility] Adult PT Treatment:                                                 DATE: 06/08/23 Therapeutic Exercise: UBE for 6 min, 3 in each direction level 1  Supine horizontal pull red band x 2 x 10 -upgrade to green Diagonal pull red 2  x 10  ER/IR unattached red x 20  Side lying red band x 15 abduction, flexion x 15  ER 2 lbs x 10 x 2  Wall push ups red band x 10  Wall slides red band looped flexion for scapular stability  Wall taps, each UE with red looped band  Row  double arm green TB x 15  Extension double arm TB  x 15  Manual Therapy: LUE PROM for assessment   [facility] Adult PT Treatment:                                                DATE: 06/03/23 Therapeutic Exercise: UBE Level 1 x 2 min each way  Pulleys  Row  10 x 2 red Shoulder ext 10 x 2 red Left shoulder IR YTB 10 x 2  Left shoulder ER YTB  10 x 2  UE ranger for flexion and scaption 2x10 Sleeper stretch 10 sec x 4  Supine shoulder flexion 2 x 10 1# Supine serratus press 2x10 2# S/L ER 1# x 10, 2# x 10  S/L abdct 1# x 10, 2# x 10 Passive IR   20 sec x 3 supine  Standing IR AAROM with dowel 10 x 2    [facility] Adult PT Treatment:                                                DATE: 06/01/23 Therapeutic Exercise: UBE L1 2 mins FWD/BWD UE ranger for flexion and scaption 2x10 Sleeper stretch x3 30" Supine shoulder flexion 2x10 1# S/L shoulder ER 2x10 1# S/L shoulder abd 2x10 !# Prone Shoulder ext 10 x 2 1# Supine serratus press 2x10 2# Modalities Cold pack to the L sholder x 10 mins  [facility] Adult PT Treatment:                                                DATE: 05/27/23 Therapeutic Exercise: Pulleys  Row  10 x 2 red Shoulder ext 10 x 2 red Left IR YTB 10 x 2  Left ER YTB 10 x 2  Prone I and T 2 x 10 each  S/L abduction 1# x 10, AROM x 10  S/L 1# ER 2 x 10  Supine flexion 10 x 2  Supine serratus press 2x10 1# IR AAROM using opp UE to assist 10 sec x 2  Modalities Cold pack to the L sholder x 10 mins  [facility] Adult PT Treatment:                                                 DATE: 05/25/23 Therapeutic Exercise: Pulleys for flexion and scaption S/L L shoulder ER 1# 2x10 S/L L shoulder abd 1# 2x10 Prone I's and Ts x10 Supine flexion x10 Supine serratus press 2x10 1# YTB shoulder ER 10x each YTB shoulder IR 10 x 2 each Modalities Cold pack to the L sholder x 10 mins  PATIENT EDUCATION: Education details: Eval findings, POC, HEP, self care  Person educated: Patient Education method: Explanation, Demonstration, Tactile cues, Verbal cues, and Handouts Education comprehension: verbalized understanding, returned demonstration, verbal cues required, and tactile cues required   HOME EXERCISE PROGRAM: Access Code: [email or web address] URL: [email or web address] Date: 06/01/2023 Prepared by: Joellyn Rued  Exercises - Seated  Shoulder Flexion Towel Slide at Table Top Full Range of Motion  - 3 x daily - 7 x weekly - 1 sets - 15 reps - 3 hold - Seated Shoulder Scaption Slide at Table Top with Forearm in Neutral  - 3 x daily - 7 x weekly - 3 sets - 10 reps - 3 hold - Seated Shoulder External Rotation PROM on Table  - 3 x daily - 7 x weekly - 3 sets - 10 reps - 3 hold - Isometric Shoulder Flexion at Wall  - 1 x daily - 7 x weekly - 1 sets - 10 reps - 5 hold - Isometric Shoulder Extension at Wall  - 1 x daily - 7 x weekly - 1 sets - 10 reps - 5 hold - Standing Isometric Shoulder External Rotation with Doorway  - 1 x daily - 7 x weekly - 1 sets - 10 reps - 5 hold - Standing Isometric Shoulder Internal Rotation with Towel Roll at Doorway  - 1 x daily - 7 x weekly - 1 sets - 10 reps - 5 hold - Isometric Shoulder Abduction at Wall  - 1 x daily - 7 x weekly - 1 sets - 10 reps - 5 hold - Standing Shoulder Row with Anchored Resistance  - 1 x daily - 7 x weekly - 2 sets - 10 reps - 3 hold - Shoulder Extension with Resistance  - 1 x daily - 7 x weekly - 3 sets - 10 reps - 3 hold - Sidelying Shoulder External Rotation  - 1 x daily - 7 x weekly - 2 sets - 10 reps - Supine Shoulder  Press  - 1 x daily - 7 x weekly - 2 sets - 10 reps - 3 hold - Sidelying Shoulder Abduction Palm Forward  - 1 x daily - 7 x weekly - 2 sets - 10 reps - 3 hold - Shoulder Flexion Wall Slide with Towel  - 1 x daily - 7 x weekly - 1-2 sets - 10 reps - Sleeper Stretch  - 1 x daily - 7 x weekly - 1 sets - 3 reps - 30 hold   ASSESSMENT:   CLINICAL IMPRESSION:   Patient has used 8 of her 8 approved visits.  She sees Dr. Ave Filter next week.  She may benefit from more PT depending on what she and her doctor discuss.  She has nearly met her goals but does show weakness in L UE with abduction and full weightbearing on her LUE. She is lacking end range ER and IR as well.  She will be re-assessed after her visit to determine best course of care with her primary PT.    OBJECTIVE IMPAIRMENTS: decreased ROM, decreased strength, impaired UE functional use, obesity, and pain.    ACTIVITY LIMITATIONS: carrying, lifting, dressing, reach over head, and caring for others   PARTICIPATION LIMITATIONS: meal prep, cleaning, laundry, driving, shopping, and occupation   PERSONAL FACTORS: Fitness, Past/current experiences, Time since onset of injury/illness/exacerbation, and 1 comorbidity: igh BMI-heavy arm  are also affecting patient's functional outcome.    REHAB POTENTIAL: Good   CLINICAL DECISION MAKING: Evolving/moderate complexity   EVALUATION COMPLEXITY: Moderate     GOALS:   SHORT TERM GOALS: Target date: 05/07/23   Pt will be Ind in an initial HEP Baseline: started Goal status: MET   2.  Pt will voice understanding of measures to assist in pain reduction  Baseline: started 05/04/23: able to voice  Goal status: MET  3.  Increase L shoulder PROM: flexion- 120, abd- 120d, ER- 60, IR 80d Baseline: see flow sheet 05/04/23: PROM: flexion- 120, abd- 120d, ER- 90, IR 65d Goal status: PARTIALLY MET   LONG TERM GOALS: Target date: 06/25/23   Pt will be Ind in a final HEP to maintain achieved  LOF Baseline: started Goal status: Ongoing   2.  Increase L shoulder AROM to within 90% of the R for improved functional use of the L UE Baseline: see flow sheets Goal status: Ongoing   3.  Increase L shoulder strength to 4+/5 or greater for improved functional use of the L UE Baseline: 2+ to 3-/5 Goal status: Ongoing   4.  Pt will be able to lift 2lbs x10 reps above shoulder height to reflect demands consistent with her work Baseline: NT  Goal status: Ongoing   5.  Pt will be able to lift 10lbs x10 reps 2 handed from knee to chest height to reflect demands consistent with her work Baseline: NT Goal status: Ongoing     PLAN:   PT FREQUENCY: 2x/week   PT DURATION: 8 weeks   PLANNED INTERVENTIONS: Therapeutic exercises, Therapeutic activity, Patient/Family education, Self Care, Joint mobilization, Dry Needling, Cryotherapy, Moist heat, Taping, Vasopneumatic device, Traction, Ionotophoresis 4mg /ml Dexamethasone, Manual therapy, and Re-evaluation   PLAN FOR NEXT SESSION: MD visit? Cont vs DC>  assess response to HEP; progress therex as indicated (isometric and periscapular strengthening); use of modalities, manual therapy; and TPDN as indicated. (S/P debridement Surgery 04/06/23)     Karie Mainland, PT 06/15/23 9:29 AM Phone: [phone] Fax: [phone]

## 2023-06-15 ENCOUNTER — Ambulatory Visit: Payer: 59 | Admitting: Physical Therapy

## 2023-06-15 ENCOUNTER — Encounter: Payer: Self-pay | Admitting: Physical Therapy

## 2023-06-15 DIAGNOSIS — M25512 Pain in left shoulder: Secondary | ICD-10-CM

## 2023-06-15 DIAGNOSIS — M25612 Stiffness of left shoulder, not elsewhere classified: Secondary | ICD-10-CM

## 2023-06-15 DIAGNOSIS — G8929 Other chronic pain: Secondary | ICD-10-CM

## 2023-06-15 DIAGNOSIS — M6281 Muscle weakness (generalized): Secondary | ICD-10-CM

## 2023-06-15 DIAGNOSIS — M75112 Incomplete rotator cuff tear or rupture of left shoulder, not specified as traumatic: Secondary | ICD-10-CM

## 2023-06-16 ENCOUNTER — Encounter: Payer: Self-pay | Admitting: Gastroenterology

## 2023-06-17 ENCOUNTER — Other Ambulatory Visit: Payer: Self-pay | Admitting: Nurse Practitioner

## 2023-06-17 DIAGNOSIS — I10 Essential (primary) hypertension: Secondary | ICD-10-CM

## 2023-06-17 MED ORDER — OLMESARTAN MEDOXOMIL 20 MG PO TABS
20.0000 mg | ORAL_TABLET | Freq: Every day | ORAL | 2 refills | Status: DC
Start: 2023-06-17 — End: 2023-07-27

## 2023-06-22 NOTE — Therapy (Signed)
OUTPATIENT PHYSICAL THERAPY TREATMENT NOTE/ ReCert/ReAuth  Patient Name: Meredith York MRN: [id number] DOB:[birth year], 47 y.o., female Today's Date: 06/23/2023  PCP: [name], FNP   REFERRING PROVIDER: Jones Broom, MD      END OF SESSION:   PT End of Session - 06/23/23 0811     Visit Number 15    Number of Visits 22   1-2x/wk   Date for PT Re-Evaluation 08/27/23    Authorization Type [employer]; [place] [employer]     Authorization Time Period 5/29-9/26, 8 visits,    Authorization - Visit Number 9    Progress Note Due on Visit 20    PT Start Time 0807    PT Stop Time 0847    PT Time Calculation (min) 40 min    Activity Tolerance Patient tolerated treatment well    Behavior During Therapy [facility] for tasks assessed/performed                     Past Medical History:  Diagnosis Date   Carpal tunnel syndrome on right    Chlamydia    Gestational diabetes mellitus    Hypertension    no medicaqtions at this time   Migraines    Ovarian cyst    S/P lumbar discectomy 04/10/2015   Tendonitis    right wrist   Trichomonas    Type 2 diabetes mellitus (HCC)    not currently taking medication   Past Surgical History:  Procedure Laterality Date   ABDOMINAL HYSTERECTOMY     BACK SURGERY     CESAREAN SECTION  12/27/2011   Procedure: CESAREAN SECTION;  Surgeon: Brock Bad, MD;  Location: [facility];  Service: Gynecology;  Laterality: N/A;  Primary cesarean section with delivery of baby girl at 82. Apgars 3/3/6   CESAREAN SECTION N/A 08/03/2016   Procedure: CESAREAN SECTION;  Surgeon: Tereso Newcomer, MD;  Location: [facility];  Service: Obstetrics;  Laterality: N/A;   CHOLECYSTECTOMY  02/2009   DILATION AND CURETTAGE OF UTERUS N/A 06/25/2014   Procedure: DILATATION AND CURETTAGE;  Surgeon: Reva Bores, MD;  Location: [facility];  Service: Gynecology;  Laterality: N/A;   LUMBAR LAMINECTOMY/DECOMPRESSION MICRODISCECTOMY Left 04/10/2015    Procedure: Left L5-S1 Microdiscectomy;  Surgeon: Eldred Manges, MD;  Location: [facility] OR;  Service: Orthopedics;  Laterality: Left;   VAGINAL HYSTERECTOMY N/A 06/04/2020   Procedure: HYSTERECTOMY VAGINAL, MORCELLATION;  Surgeon: Hermina Staggers, MD;  Location: [facility];  Service: Gynecology;  Laterality: N/A;   WISDOM TOOTH EXTRACTION     Patient Active Problem List   Diagnosis Date Noted   Flank pain 06/10/2023   Rotator cuff tear, left 01/18/2023   OA (osteoarthritis) of shoulder 12/03/2022   Peroneal tendon tear, left, subsequent encounter 01/17/2021   Recurrent herniation of lumbar disc 10/30/2020   Prediabetes 09/24/2020   Post-operative state 06/04/2020   Visit for routine gyn exam 11/29/2019   Encounter for screening mammogram for breast cancer 11/29/2019   History of bilateral tubal ligation 11/29/2019   S/P cesarean section 07/14/2016   Hypertension 07/05/2011    REFERRING DIAG: s/p left shoulder cuff /debridment ,SAD DCE 04/06/2023   THERAPY DIAG:  Acute pain of left shoulder  Muscle weakness (generalized)  Stiffness of left shoulder, not elsewhere classified  Rationale for Evaluation and Treatment Rehabilitation  ONSET DATE: 4/16 surgery; Initial issue approx 2 years   SUBJECTIVE:  SUBJECTIVE STATEMENT:  Pt reports her L shoulder continues to improve with strength being her greatest concern for completing work related duties. 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   PAIN:  Are you having pain? Yes: NPRS scale: 0-3/10 depending on activity level, lifting, certain sudden movements Pain location: L GH area Pain description: ache, throb Aggravating factors: Certain movement Relieving factors: Rest, Ice packs  PERTINENT HISTORY: High  BMI-heavy arm; HTN   PRECAUTIONS: None   WEIGHT BEARING RESTRICTIONS: No WBAT   FALLS:  Has patient fallen in last 6 months? 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 Partial-thickness midsubstance tear of the mid to anterior supraspinatus tendon footprint measuring up to 10 mm in AP dimension. 2. Moderate degenerative changes of the acromioclavicular joint. High-grade marrow edema within the clavicular head suggests this may represent a source of pain. 3. Mild glenohumeral cartilage degenerative changes.   PATIENT SURVEYS:  FOTO: Perceived function   65%, predicted   68%. 05/25/23=56%   COGNITION: Overall cognitive status: Within functional limits for tasks assessed                                  SENSATION: WFL   POSTURE: Forward ead, rounded shoulders   UPPER EXTREMITY ROM:    Active ROM Right eval Left eval LT 04/22/23 LT 04/28/23 LT 05/04/23 LT 05/06/23 LT 05/19/23 Lt 05/27/23 LT 06/03/23 LT  06/23/23  Shoulder flexion 150 A62; P80 AA120 P125 P135  A120 P150 135 145 145  Shoulder extension              Shoulder abduction 154 A72; P85  P115 P120   125 135 135  Shoulder adduction              Shoulder internal rotation T4 A T1; P40  P 60 P65 P70  Left PSIS Reaches L5  L5  Shoulder external rotation T7 A GT hip; P70  P 90 P90   Reaches back of neck T2 T2  Elbow flexion              Elbow extension              Wrist flexion              Wrist extension              Wrist  ulnar deviation              Wrist radial deviation              Wrist pronation              Wrist supination              (Blank rows = not tested)   UPPER EXTREMITY MMT: L shoulder generally 2+ to 3_ MMT Right eval Left eval Lt  06/08/23 Lt 06/23/23  Shoulder flexion 5   4-  4  Shoulder extension 5      Shoulder abduction 5   3+ pain 4-  Shoulder adduction 5      Shoulder internal rotation 5   4+ 4+  Shoulder external rotation 5   4 4   Middle trapezius        Lower trapezius        Elbow flexion     4+ 4+  Elbow extension        Wrist flexion        Wrist extension        Wrist ulnar deviation        Wrist radial deviation        Wrist pronation        Wrist supination        Grip strength (lbs)        (Blank rows = not tested)   SHOULDER SPECIAL TESTS: Impingement tests:  NT SLAP lesions:  NT Instability tests:  NT Rotator cuff assessment:  NT Biceps assessment:  NT   PALPATION:  TTP peri-GH jt area      TODAY'S TREATMENT:  [facility] Adult PT Treatment:                                                DATE: 06/23/23 Therapeutic Exercise: UBE 5 min L1; 2.5 min FWD/BWD  Wall wash flexion and abd x10 each Standing IR 3x10 RTB Standing ER 3x10 RTB Standing scaption and forward raise 1 lb 3x10 Standing shoulder row 3x10 GTB Standing shoulder ext 3x10 GTB Updated HEP to progress strengthening  [facility] Adult PT Treatment:                                                DATE: 06/15/23 Therapeutic Exercise: NuStep  Bicep curls 5 lbs x 20  Scaption and forward raise 3 lbs x 12 Horizontal pull green band x 15 Supine ER 2 x 30 reps, green band , 0 deg and 90 deg abd IR at 90 deg x 30 Diagonal pull green band x 15   4 lbs chest press x 10 added protraction x 10  Quadruped UE lift alternating x 8, added opposite leg (bird dog ) x 8  Standing serratus push green band on wall X 15 , then protraction  Single arm overhead press 2 lbs x 10 reps Towel for Internal rotation x 5 to L3     [facility] Adult PT Treatment:                                                DATE: 06/10/23 Therapeutic Exercise: UBE 6 min L2  Row, extension x 20 blue band  Diagonal pull and horizontal abd red band x 15  ER x 15 green band  ER iso with sidestepping  High plank on mat table: alt UE lift, then added opposite LE Seated ER 3 lbs 2 x 10  Seated 90 deg Abd 3 lbs (elbow flexed) x 10 hover off mat 5 sec  Facing mat 2 lbs shoulder flexion  x 10 (hover) 90 deg  Supine dowel AAROM all planes   PATIENT EDUCATION: Education details: Eval findings, POC, HEP, self care  Person educated: Patient Education method: Explanation, Demonstration, Tactile cues, Verbal cues, and Handouts Education comprehension: verbalized understanding, returned demonstration, verbal cues required, and tactile cues required   HOME EXERCISE PROGRAM: Access Code: [email or web address] URL: [email or web address] Date: 06/23/2023 Prepared by: Joellyn Rued  Exercises - Standing Shoulder Row with Anchored Resistance  - 1 x daily - 7 x weekly - 2 sets - 10 reps - 3 hold - Shoulder Extension with Resistance  - 1 x daily - 7 x weekly - 3 sets - 10 reps - 3 hold - Shoulder External Rotation with Anchored Resistance  - 1 x daily - 7 x weekly - 3 sets - 10 reps - Shoulder Internal Rotation with Resistance (Mirrored)  - 1 x daily - 7 x weekly - 3 sets - 10 reps - 3 hold - Sidelying Shoulder External Rotation  - 1 x daily - 7 x weekly - 2 sets - 10 reps - Supine Shoulder Press  - 1 x daily - 7 x weekly - 2 sets - 10 reps - 3 hold - Sidelying Shoulder Abduction Palm Forward  - 1 x daily - 7 x weekly - 2 sets - 10 reps - 3 hold - Standing Shoulder Flexion to 90 Degrees with Dumbbells  - 1 x daily - 7 x weekly - 3 sets - 10 reps - Shoulder Flexion Wall Slide with Towel  - 1 x daily - 7 x weekly - 1-2 sets - 10 reps - Standing Shoulder Abduction Slides at Wall  - 1 x daily - 7 x weekly - 1-2 sets - 10 reps - Sleeper Stretch  - 1 x  daily - 7 x weekly - 1 sets - 3 reps - 30 hold   ASSESSMENT:   CLINICAL IMPRESSION:   Patient returns to PT following appt c surgeon, Dr. Ave Filter, with a new referral to continue OPPT for L shoulder rehab.  Reassessment found L shoulder AROM and strength improved for all motions. Some of these motions and strength measures have met set goals. 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 Job duties include pushing a cart, lifting 10 lbs c both hands and lifting 4 lbs overhead c one hand. Pt tolerated PT today without adverse effects. EHP was updated to progress strengthening.   OBJECTIVE IMPAIRMENTS: decreased ROM, decreased strength, impaired UE functional use, obesity, and pain.    ACTIVITY LIMITATIONS: carrying, lifting, dressing, reach over head, and caring for others   PARTICIPATION LIMITATIONS: meal prep, cleaning, laundry, driving, shopping, and occupation   PERSONAL FACTORS: Fitness, Past/current experiences, Time since onset of injury/illness/exacerbation, and 1 comorbidity: igh BMI-heavy arm  are also affecting patient's functional outcome.    REHAB POTENTIAL: Good   CLINICAL DECISION MAKING: Evolving/moderate complexity   EVALUATION COMPLEXITY: Moderate     GOALS:   SHORT TERM GOALS: Target date: 05/07/23   Pt will be Ind in an initial HEP Baseline: started Goal status: MET   2.  Pt will voice understanding of measures to assist in pain reduction  Baseline: started 05/04/23: able to voice  Goal status: MET   3.  Increase L shoulder PROM: flexion- 120, abd- 120d, ER- 60, IR 80d Baseline: see flow sheet 05/04/23: PROM: flexion- 120, abd- 120d, ER- 90, IR 65d  Goal status: PARTIALLY MET   LONG TERM GOALS: Target date: 08/27/23   Pt will be Ind in a final HEP to maintain achieved LOF Baseline: started Goal status: Ongoing    2.  Increase L shoulder AROM to within 90% of the R for improved functional use of the L UE Baseline: see flow sheets Staus: 06/23/23= see flow sheet Goal status: Partially Met   3.  Increase L shoulder strength to 4+/5 or greater for improved functional use of the L UE Baseline: 2+ to 3-/5; 06/23/23= see flow sheets Goal status: Partially Met   4.  Pt will be able to lift 4lbs x10 reps above shoulder height to reflect demands consistent with her work Baseline: NT  Goal status: Ongoing   5.  Pt will be able to lift 10lbs x10 reps 2 handed from knee to chest height to reflect demands consistent with her work Baseline: NT Goal status: Ongoing  6. Pt will be able to push a 30lb cart 46ft x 4 to reflect demands consistent with her work  Baseline: NT  Goal status: New goal as of 06/23/23     PLAN:   PT FREQUENCY: 1x/week   PT DURATION: 8 weeks   PLANNED INTERVENTIONS: Therapeutic exercises, Therapeutic activity, Patient/Family education, Self Care, Joint mobilization, Dry Needling, Cryotherapy, Moist heat, Taping, Vasopneumatic device, Traction, Ionotophoresis 4mg /ml Dexamethasone, Manual therapy, and Re-evaluation   PLAN FOR NEXT SESSION: MD visit? Cont vs DC>  assess response to HEP; progress therex as indicated (isometric and periscapular strengthening); use of modalities, manual therapy; and TPDN as indicated. (S/P debridement Surgery 04/06/23)   Joellyn Rued MS, PT 06/23/23 9:28 AM  Check all possible CPT codes: 54098 - PT Re-evaluation, 97110- Therapeutic Exercise, [phone]- Neuro Re-education, 97140 - Manual Therapy, 97530 - Therapeutic Activities, 97535 - Self Care, [phone] - Electrical stimulation (Manual), and [id number] - Ultrasound    Check all conditions that are expected to impact treatment: {Conditions expected to impact treatment:Musculoskeletal disorders   If treatment provided at initial evaluation, no treatment charged due to lack of authorization.

## 2023-06-23 ENCOUNTER — Ambulatory Visit: Payer: 59 | Attending: Family Medicine

## 2023-06-23 DIAGNOSIS — M6281 Muscle weakness (generalized): Secondary | ICD-10-CM | POA: Insufficient documentation

## 2023-06-23 DIAGNOSIS — M25512 Pain in left shoulder: Secondary | ICD-10-CM | POA: Diagnosis present

## 2023-06-23 DIAGNOSIS — M25612 Stiffness of left shoulder, not elsewhere classified: Secondary | ICD-10-CM | POA: Diagnosis present

## 2023-06-25 ENCOUNTER — Other Ambulatory Visit: Payer: Self-pay | Admitting: Family Medicine

## 2023-06-25 ENCOUNTER — Ambulatory Visit: Payer: 59

## 2023-06-25 DIAGNOSIS — E1169 Type 2 diabetes mellitus with other specified complication: Secondary | ICD-10-CM

## 2023-06-25 MED ORDER — ATORVASTATIN CALCIUM 20 MG PO TABS
20.0000 mg | ORAL_TABLET | Freq: Every day | ORAL | 5 refills | Status: DC
Start: 2023-06-25 — End: 2023-09-28

## 2023-06-25 MED ORDER — RYBELSUS 3 MG PO TABS
3.0000 mg | ORAL_TABLET | Freq: Every day | ORAL | 5 refills | Status: DC
Start: 2023-06-25 — End: 2023-07-16

## 2023-06-25 NOTE — Progress Notes (Signed)
LDL >100, A1c 6.6 will send  Atorvastatin 20 mg Q D and Rybelsus 3 mg Q D to pharmacy.I have discussed labs with patient on the phone already.  Ask patient to come get sample of Rybelsus from the office.   Thank you!

## 2023-06-29 ENCOUNTER — Ambulatory Visit: Payer: 59

## 2023-06-29 VITALS — BP 136/98 | HR 82 | Temp 98.1°F | Ht 62.0 in | Wt 211.0 lb

## 2023-06-29 DIAGNOSIS — I10 Essential (primary) hypertension: Secondary | ICD-10-CM

## 2023-06-29 NOTE — Patient Instructions (Signed)
Hypertension, Adult Hypertension is another name for high blood pressure. High blood pressure forces your heart to work harder to pump blood. This can cause problems over time. There are two numbers in a blood pressure reading. There is a top number (systolic) over a bottom number (diastolic). It is best to have a blood pressure that is below 120/80. What are the causes? The cause of this condition is not known. Some other conditions can lead to high blood pressure. What increases the risk? Some lifestyle factors can make you more likely to develop high blood pressure: Smoking. Not getting enough exercise or physical activity. Being overweight. Having too much fat, sugar, calories, or salt (sodium) in your diet. Drinking too much alcohol. Other risk factors include: Having any of these conditions: Heart disease. Diabetes. High cholesterol. Kidney disease. Obstructive sleep apnea. Having a family history of high blood pressure and high cholesterol. Age. The risk increases with age. Stress. What are the signs or symptoms? High blood pressure may not cause symptoms. Very high blood pressure (hypertensive crisis) may cause: Headache. Fast or uneven heartbeats (palpitations). Shortness of breath. Nosebleed. Vomiting or feeling like you may vomit (nauseous). Changes in how you see. Very bad chest pain. Feeling dizzy. Seizures. How is this treated? This condition is treated by making healthy lifestyle changes, such as: Eating healthy foods. Exercising more. Drinking less alcohol. Your doctor may prescribe medicine if lifestyle changes do not help enough and if: Your top number is above 130. Your bottom number is above 80. Your personal target blood pressure may vary. Follow these instructions at home: Eating and drinking  If told, follow the DASH eating plan. To follow this plan: Fill one half of your plate at each meal with fruits and vegetables. Fill one fourth of your plate  at each meal with whole grains. Whole grains include whole-wheat pasta, brown rice, and whole-grain bread. Eat or drink low-fat dairy products, such as skim milk or low-fat yogurt. Fill one fourth of your plate at each meal with low-fat (lean) proteins. Low-fat proteins include fish, chicken without skin, eggs, beans, and tofu. Avoid fatty meat, cured and processed meat, or chicken with skin. Avoid pre-made or processed food. Limit the amount of salt in your diet to less than 1,500 mg each day. Do not drink alcohol if: Your doctor tells you not to drink. You are pregnant, may be pregnant, or are planning to become pregnant. If you drink alcohol: Limit how much you have to: 0-1 drink a day for women. 0-2 drinks a day for men. Know how much alcohol is in your drink. In the [place] one drink equals one 12 oz bottle of beer (355 mL), one 5 oz glass of wine (148 mL), or one 1 oz glass of hard liquor (44 mL). Lifestyle  Work with your doctor to stay at a healthy weight or to lose weight. Ask your doctor what the best weight is for you. Get at least 30 minutes of exercise that causes your heart to beat faster (aerobic exercise) most days of the week. This may include walking, swimming, or biking. Get at least 30 minutes of exercise that strengthens your muscles (resistance exercise) at least 3 days a week. This may include lifting weights or doing Pilates. Do not smoke or use any products that contain nicotine or tobacco. If you need help quitting, ask your doctor. Check your blood pressure at home as told by your doctor. Keep all follow-up visits. Medicines Take over-the-counter and prescription medicines   only as told by your doctor. Follow directions carefully. Do not skip doses of blood pressure medicine. The medicine does not work as well if you skip doses. Skipping doses also puts you at risk for problems. Ask your doctor about side effects or reactions to medicines that you should watch  for. Contact a doctor if: You think you are having a reaction to the medicine you are taking. You have headaches that keep coming back. You feel dizzy. You have swelling in your ankles. You have trouble with your vision. Get help right away if: You get a very bad headache. You start to feel mixed up (confused). You feel weak or numb. You feel faint. You have very bad pain in your: Chest. Belly (abdomen). You vomit more than once. You have trouble breathing. These symptoms may be an emergency. Get help right away. Call 911. Do not wait to see if the symptoms will go away. Do not drive yourself to the hospital. Summary Hypertension is another name for high blood pressure. High blood pressure forces your heart to work harder to pump blood. For most people, a normal blood pressure is less than 120/80. Making healthy choices can help lower blood pressure. If your blood pressure does not get lower with healthy choices, you may need to take medicine. This information is not intended to replace advice given to you by your health care provider. Make sure you discuss any questions you have with your health care provider. Document Revised: 09/25/2021 Document Reviewed: 09/25/2021 Elsevier Patient Education  2024 [employer]  

## 2023-06-29 NOTE — Progress Notes (Signed)
Patient presents today for bpc. She currently takes Olmesartan 20MG  she reports taking at night before bed. She admits getting into a routine to take medication around 8pm every night. Denies headache, chest pain, SOB.  Initial bp: 142/100. Bp taken again after 10 minutes:  BP Readings from Last 3 Encounters:  06/29/23 (!) 136/98  06/08/23 130/88  06/05/23 (!) 153/108  Per provider patient is to increase Olmesartan to 40MG . Patient aware. She is scheduled to come back in August for follow up.  Patient aware to take 2 of 20MG  capsules.

## 2023-07-06 NOTE — Therapy (Signed)
OUTPATIENT PHYSICAL THERAPY TREATMENT NOTE/ ReCert/ReAuth  Patient Name: Meredith York MRN: [id number] DOB:1976/11/12, 47 y.o., female Today's Date: 07/07/2023  PCP: [name], FNP   REFERRING PROVIDER: Jones Broom, MD      END OF SESSION:   PT End of Session - 07/07/23 0719     Visit Number 16    Number of Visits 22    Date for PT Re-Evaluation 08/27/23    Authorization Type [employer]; [place] MEDICA[employer] [place]    Authorization Time Period 5/29-9/26, 8 visits,    Authorization - Visit Number 10    Authorization - Number of Visits 8    Progress Note Due on Visit 20    PT Start Time 0719    PT Stop Time 0800    PT Time Calculation (min) 41 min    Activity Tolerance Patient tolerated treatment well    Behavior During Therapy [facility] for tasks assessed/performed                      Past Medical History:  Diagnosis Date   Carpal tunnel syndrome on right    Chlamydia    Gestational diabetes mellitus    Hypertension    no medicaqtions at this time   Migraines    Ovarian cyst    S/P lumbar discectomy 04/10/2015   Tendonitis    right wrist   Trichomonas    Type 2 diabetes mellitus (HCC)    not currently taking medication   Past Surgical History:  Procedure Laterality Date   ABDOMINAL HYSTERECTOMY     BACK SURGERY     CESAREAN SECTION  12/27/2011   Procedure: CESAREAN SECTION;  Surgeon: Brock Bad, MD;  Location: [facility];  Service: Gynecology;  Laterality: N/A;  Primary cesarean section with delivery of baby girl at 17. Apgars 3/3/6   CESAREAN SECTION N/A 08/03/2016   Procedure: CESAREAN SECTION;  Surgeon: Tereso Newcomer, MD;  [facility]: [facility];  Service: Obstetrics;  Laterality: N/A;   CHOLECYSTECTOMY  02/2009   DILATION AND CURETTAGE OF UTERUS N/A 06/25/2014   Procedure: DILATATION AND CURETTAGE;  Surgeon: Reva Bores, MD;  Location: [facility];  Service: Gynecology;  Laterality: N/A;   LUMBAR LAMINECTOMY/DECOMPRESSION  MICRODISCECTOMY Left 04/10/2015   Procedure: Left L5-S1 Microdiscectomy;  Surgeon: Eldred Manges, MD;  [facility];  Service: Orthopedics;  Laterality: Left;   VAGINAL HYSTERECTOMY N/A 06/04/2020   Procedure: HYSTERECTOMY VAGINAL, MORCELLATION;  Surgeon: Hermina Staggers, MD;  Location: [facility];  Service: Gynecology;  Laterality: N/A;   WISDOM TOOTH EXTRACTION     Patient Active Problem List   Diagnosis Date Noted   Flank pain 06/10/2023   Rotator cuff tear, left 01/18/2023   OA (osteoarthritis) of shoulder 12/03/2022   Peroneal tendon tear, left, subsequent encounter 01/17/2021   Recurrent herniation of lumbar disc 10/30/2020   Prediabetes 09/24/2020   Post-operative state 06/04/2020   Visit for routine gyn exam 11/29/2019   Encounter for screening mammogram for breast cancer 11/29/2019   History of bilateral tubal ligation 11/29/2019   S/P cesarean section 07/14/2016   Hypertension 07/05/2011    REFERRING DIAG: s/p left shoulder cuff /debridment ,SAD DCE 04/06/2023   THERAPY DIAG:  Acute pain of left shoulder  Muscle weakness (generalized)  Stiffness of left shoulder, not elsewhere classified  Rationale for Evaluation and Treatment Rehabilitation  ONSET DATE: 4/16 surgery; Initial issue approx 2 years   SUBJECTIVE:  SUBJECTIVE STATEMENT:  Pt reports her L shoulder is continuing to improve. She is working light duty at work.   PAIN:  Are you having pain? Yes: NPRS scale: 0-3/10 depending on activity level, lifting, certain sudden movements Pain location: L GH area Pain description: ache, throb Aggravating factors: Certain movement Relieving factors: Rest, Ice packs  PERTINENT HISTORY: High BMI-heavy arm; HTN   PRECAUTIONS: None   WEIGHT BEARING RESTRICTIONS: No  WBAT   FALLS:  Has patient fallen in last 6 months? 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 Partial-thickness midsubstance tear of the mid to anterior supraspinatus tendon footprint measuring up to 10 mm in AP dimension. 2. Moderate degenerative changes of the acromioclavicular joint. High-grade marrow edema within the clavicular head suggests this may represent a source of pain. 3. Mild glenohumeral cartilage degenerative changes.   PATIENT SURVEYS:  FOTO: Perceived function   65%, predicted   68%. 05/25/23=56%   COGNITION: Overall cognitive status: Within functional limits for tasks assessed                                  SENSATION: WFL   POSTURE: Forward ead, rounded shoulders   UPPER EXTREMITY ROM:    Active ROM Right eval Left eval LT 04/22/23 LT 04/28/23 LT 05/04/23 LT 05/06/23 LT 05/19/23 Lt 05/27/23 LT 06/03/23 LT  06/23/23  Shoulder flexion 150 A62; P80 AA120 P125 P135  A120 P150 135 145 145  Shoulder extension              Shoulder abduction 154 A72; P85  P115 P120   125 135 135  Shoulder adduction              Shoulder internal rotation T4 A T1; P40  P 60 P65 P70  Left PSIS Reaches L5  L5  Shoulder external rotation T7 A GT hip; P70  P 90 P90   Reaches back of neck T2 T2  Elbow flexion              Elbow extension              Wrist flexion              Wrist extension              Wrist ulnar deviation              Wrist radial deviation              Wrist  pronation              Wrist supination              (Blank rows = not tested)   UPPER EXTREMITY MMT: L shoulder generally 2+ to 3_ MMT Right eval Left eval Lt  06/08/23 Lt 06/23/23  Shoulder flexion 5   4- 4  Shoulder extension 5      Shoulder abduction 5   3+  pain 4-  Shoulder adduction 5      Shoulder internal rotation 5   4+ 4+  Shoulder external rotation 5   4 4   Middle trapezius        Lower trapezius        Elbow flexion     4+ 4+  Elbow extension        Wrist flexion        Wrist extension        Wrist ulnar deviation        Wrist radial deviation        Wrist pronation        Wrist supination        Grip strength (lbs)        (Blank rows = not tested)   SHOULDER SPECIAL TESTS: Impingement tests:  NT SLAP lesions:  NT Instability tests:  NT Rotator cuff assessment:  NT Biceps assessment:  NT   PALPATION:  TTP peri-GH jt area      TODAY'S TREATMENT:  [facility] Adult PT Treatment:                                                DATE: 07/07/23 Therapeutic Exercise: UBE 5 min L1; 2.5 min FWD/BWD  Wall wash flexion and abd x10 each Standing shoulder flexion and abd 2x10 2# Above shoulder shelf lifting 2x10 4# S/L shoulder ER  2x10 3#, x8 4# Standing serratus lifts c band press  Wall serratus press 2x10 Knee to waist height 2 handed lifts 2x10 5# box L [facility] Adult PT Treatment:                                                DATE: 06/23/23 Therapeutic Exercise: UBE 5 min L1; 2.5 min FWD/BWD  Wall wash flexion and abd x10 each Standing IR 3x10 RTB Standing ER 3x10 RTB Standing scaption and forward raise 1 lb 3x10 Standing shoulder row 3x10 GTB Standing shoulder ext 3x10 GTB Updated HEP to progress strengthening  [facility] Adult PT Treatment:                                                DATE: 06/15/23 Therapeutic Exercise: NuStep  Bicep curls 5 lbs x 20  Scaption and forward raise 3 lbs x 12 Horizontal pull green band x 15 Supine ER 2 x 30 reps, green band , 0 deg  and 90 deg abd IR at 90 deg x 30 Diagonal pull green band x 15   4 lbs chest press x 10 added protraction x 10  Quadruped UE lift alternating x 8, added opposite leg (bird dog ) x 8  Standing serratus push green band on wall X 15 , then protraction  Single arm overhead press 2 lbs x 10 reps Towel for Internal rotation x 5 to L3    [facility] Adult PT Treatment:  DATE: 06/10/23 Therapeutic Exercise: UBE 6 min L2  Row, extension x 20 blue band  Diagonal pull and horizontal abd red band x 15  ER x 15 green band  ER iso with sidestepping  High plank on mat table: alt UE lift, then added opposite LE Seated ER 3 lbs 2 x 10  Seated 90 deg Abd 3 lbs (elbow flexed) x 10 hover off mat 5 sec  Facing mat 2 lbs shoulder flexion  x 10 (hover) 90 deg   Supine dowel AAROM all planes   PATIENT EDUCATION: Education details: Eval findings, POC, HEP, self care  Person educated: Patient Education method: Explanation, Demonstration, Tactile cues, Verbal cues, and Handouts Education comprehension: verbalized understanding, returned demonstration, verbal cues required, and tactile cues required   HOME EXERCISE PROGRAM: Access Code: [email or web address] URL: [email or web address] Date: 06/23/2023 Prepared by: Joellyn Rued  Exercises - Standing Shoulder Row with Anchored Resistance  - 1 x daily - 7 x weekly - 2 sets - 10 reps - 3 hold - Shoulder Extension with Resistance  - 1 x daily - 7 x weekly - 3 sets - 10 reps - 3 hold - Shoulder External Rotation with Anchored Resistance  - 1 x daily - 7 x weekly - 3 sets - 10 reps - Shoulder Internal Rotation with Resistance (Mirrored)  - 1 x daily - 7 x weekly - 3 sets - 10 reps - 3 hold - Sidelying Shoulder External Rotation  - 1 x daily - 7 x weekly - 2 sets - 10 reps - Supine Shoulder Press  - 1 x daily - 7 x weekly - 2 sets - 10 reps - 3 hold - Sidelying Shoulder Abduction Palm Forward  - 1 x daily - 7 x weekly - 2  sets - 10 reps - 3 hold - Standing Shoulder Flexion to 90 Degrees with Dumbbells  - 1 x daily - 7 x weekly - 3 sets - 10 reps - Shoulder Flexion Wall Slide with Towel  - 1 x daily - 7 x weekly - 1-2 sets - 10 reps - Standing Shoulder Abduction Slides at Wall  - 1 x daily - 7 x weekly - 1-2 sets - 10 reps - Sleeper Stretch  - 1 x daily - 7 x weekly - 1 sets - 3 reps - 30 hold   ASSESSMENT:   CLINICAL IMPRESSION:   PT was completed for L shoulder strengthening. Therex was completed at a greater demand. Work related lifting was completed for single arm over shoulder and knee to waist height 2 handed lifts. Pt tolerated PT the prescribed without adverse effects. Pt will continue to benefit from skilled PT to address impairments for improved function and return back to full duty at work.   OBJECTIVE IMPAIRMENTS: decreased ROM, decreased strength, impaired UE functional use, obesity, and pain.    ACTIVITY LIMITATIONS: carrying, lifting, dressing, reach over head, and caring for others   PARTICIPATION LIMITATIONS: meal prep, cleaning, laundry, driving, shopping, and occupation   PERSONAL FACTORS: Fitness, Past/current experiences, Time since onset of injury/illness/exacerbation, and 1 comorbidity: igh BMI-heavy arm  are also affecting patient's functional outcome.    REHAB POTENTIAL: Good   CLINICAL DECISION MAKING: Evolving/moderate complexity   EVALUATION COMPLEXITY: Moderate     GOALS:   SHORT TERM GOALS: Target date: 05/07/23   Pt will be Ind in an initial HEP Baseline: started Goal status: MET   2.  Pt will voice understanding of measures to assist in pain reduction  Baseline: started 05/04/23: able to voice  Goal status: MET   3.  Increase L shoulder PROM: flexion- 120, abd- 120d, ER- 60, IR 80d Baseline: see flow sheet 05/04/23: PROM: flexion- 120, abd- 120d, ER- 90, IR 65d Goal status: PARTIALLY MET   LONG TERM GOALS: Target date: 08/27/23   Pt will be Ind in a final HEP to  maintain achieved LOF Baseline: started Goal status: Ongoing   2.  Increase L shoulder AROM to within 90% of the R for improved functional use of the L UE Baseline: see flow sheets St[place]: 06/23/23= see flow sheet Goal status: Partially Met   3.  Increase L shoulder strength to 4+/5 or greater for improved functional use of the L UE Baseline: 2+ to 3-/5; 06/23/23= see flow sheets Goal status: Partially Met   4.  Pt will be able to lift 4lbs x10 reps above shoulder height to reflect demands consistent with her work Baseline: NT  Goal status: Ongoing   5.  Pt will be able to lift 10lbs x10 reps 2 handed from knee to chest height to reflect demands consistent with her work Baseline: NT Goal status: Ongoing  6. Pt will be able to push a 30lb cart 53ft x 4 to reflect demands consistent with her work  Baseline: NT  Goal status: New goal as of 06/23/23     PLAN:   PT FREQUENCY: 1x/week   PT DURATION: 8 weeks   PLANNED INTERVENTIONS: Therapeutic exercises, Therapeutic activity, Patient/Family education, Self Care, Joint mobilization, Dry Needling, Cryotherapy, Moist heat, Taping, Vasopneumatic device, Traction, Ionotophoresis 4mg /ml Dexamethasone, Manual therapy, and Re-evaluation   PLAN FOR NEXT SESSION: MD visit? Cont vs DC>  assess response to HEP; progress therex as indicated (isometric and periscapular strengthening); use of modalities, manual therapy; and TPDN as indicated. (S/P debridement Surgery 04/06/23)   Joellyn Rued MS, PT 07/07/23 8:03 AM

## 2023-07-07 ENCOUNTER — Ambulatory Visit: Payer: 59

## 2023-07-07 DIAGNOSIS — M25512 Pain in left shoulder: Secondary | ICD-10-CM | POA: Diagnosis not present

## 2023-07-07 DIAGNOSIS — M6281 Muscle weakness (generalized): Secondary | ICD-10-CM

## 2023-07-07 DIAGNOSIS — M25612 Stiffness of left shoulder, not elsewhere classified: Secondary | ICD-10-CM

## 2023-07-08 ENCOUNTER — Ambulatory Visit: Payer: 59 | Admitting: Obstetrics and Gynecology

## 2023-07-08 ENCOUNTER — Encounter: Payer: Self-pay | Admitting: Obstetrics and Gynecology

## 2023-07-08 ENCOUNTER — Ambulatory Visit
Admission: RE | Admit: 2023-07-08 | Discharge: 2023-07-08 | Disposition: A | Discharge status: Home / Self Care | Payer: 59 | Source: Ambulatory Visit | Attending: Family Medicine | Admitting: Family Medicine

## 2023-07-08 ENCOUNTER — Other Ambulatory Visit (HOSPITAL_COMMUNITY)
Admission: RE | Admit: 2023-07-08 | Discharge: 2023-07-08 | Disposition: A | Discharge status: Home / Self Care | Payer: 59 | Source: Ambulatory Visit | Attending: Obstetrics and Gynecology | Admitting: Obstetrics and Gynecology

## 2023-07-08 VITALS — BP 179/112 | HR 72 | Ht 62.5 in | Wt 210.0 lb

## 2023-07-08 DIAGNOSIS — Z202 Contact with and (suspected) exposure to infections with a predominantly sexual mode of transmission: Secondary | ICD-10-CM | POA: Diagnosis not present

## 2023-07-08 DIAGNOSIS — Z01419 Encounter for gynecological examination (general) (routine) without abnormal findings: Secondary | ICD-10-CM | POA: Insufficient documentation

## 2023-07-08 DIAGNOSIS — Z1231 Encounter for screening mammogram for malignant neoplasm of breast: Secondary | ICD-10-CM

## 2023-07-08 NOTE — Progress Notes (Signed)
Pt is in the office for annual 06/04/2020 Hx of vaginal hysterectomy  Mammogram is scheduled for today, last one was 01/20/2021  Last pap 11/29/2019 Desires STD testing today

## 2023-07-08 NOTE — Progress Notes (Signed)
Meredith York is a 47 y.o. [phone] female here for a routine annual gynecologic exam.  Current complaints: Desires STD testing.   Denies abnormal vaginal bleeding, discharge, pelvic pain, problems with intercourse or other gynecologic concerns.    Gynecologic History No LMP recorded (lmp unknown). Patient has had a hysterectomy. Contraception: status post hysterectomy Last Pap: 12/20. Results were: normal Last mammogram: 1/22. Results were: normal  Obstetric History OB History  Gravida Para Term Preterm AB Living  6 4 3 1 2 3   SAB IAB Ectopic Multiple Live Births  1 1   0 4    # Outcome Date GA Lbr Len/2nd Weight Sex Type Anes PTL Lv  6 Preterm 08/03/16 [redacted]w[redacted]d  7 lb 12.5 oz (3.53 kg) F CS-LTranv Spinal  LIV  5 SAB 07/02/14          4 Term 12/27/11 [redacted]w[redacted]d    CS-LTranv Spinal  ND  3 Term 04/03/07 [redacted]w[redacted]d  8 lb 15 oz (4.054 kg) F Vag-Spont EPI N LIV  2 Term 05/14/97 [redacted]w[redacted]d  7 lb 12 oz (3.515 kg) F Vag-Spont EPI N LIV  1 IAB             Past Medical History:  Diagnosis Date   Carpal tunnel syndrome on right    Chlamydia    Gestational diabetes mellitus    Hypertension    no medicaqtions at this time   Migraines    Ovarian cyst    S/P lumbar discectomy 04/10/2015   Tendonitis    right wrist   Trichomonas    Type 2 diabetes mellitus (HCC)    not currently taking medication    Past Surgical History:  Procedure Laterality Date   ABDOMINAL HYSTERECTOMY     BACK SURGERY     CESAREAN SECTION  12/27/2011   Procedure: CESAREAN SECTION;  Surgeon: Brock Bad, MD;  Location: [facility];  Service: Gynecology;  Laterality: N/A;  Primary cesarean section with delivery of baby girl at 68. Apgars 3/3/6   CESAREAN SECTION N/A 08/03/2016   Procedure: CESAREAN SECTION;  Surgeon: Tereso Newcomer, MD;  [facility];  Service: Obstetrics;  Laterality: N/A;   CHOLECYSTECTOMY  02/2009   DILATION AND CURETTAGE OF UTERUS N/A 06/25/2014   Procedure: DILATATION AND CURETTAGE;  Surgeon:  Reva Bores, MD;  Location: [facility];  Service: Gynecology;  Laterality: N/A;   LUMBAR LAMINECTOMY/DECOMPRESSION MICRODISCECTOMY Left 04/10/2015   Procedure: Left L5-S1 Microdiscectomy;  Surgeon: Eldred Manges, MD;  Location: [facility];  Service: Orthopedics;  Laterality: Left;   VAGINAL HYSTERECTOMY N/A 06/04/2020   Procedure: HYSTERECTOMY VAGINAL, MORCELLATION;  Surgeon: Hermina Staggers, MD;  Location: [facility];  Service: Gynecology;  Laterality: N/A;   WISDOM TOOTH EXTRACTION      Current Outpatient Medications on File Prior to Visit  Medication Sig Dispense Refill   atorvastatin (LIPITOR) 20 MG tablet Take 1 tablet (20 mg total) by mouth daily. 30 tablet 5   olmesartan (BENICAR) 20 MG tablet Take 1 tablet (20 mg total) by mouth daily. (Patient taking differently: Take 40 mg by mouth daily.) 30 tablet 2   naproxen (NAPROSYN) 500 MG tablet Take 1 tablet (500 mg total) by mouth 2 (two) times daily as needed. (Patient not taking: Reported on 07/08/2023) 30 tablet 0   Semaglutide (RYBELSUS) 3 MG TABS Take 1 tablet (3 mg total) by mouth daily. (Patient not taking: Reported on 07/08/2023) 30 tablet 5   traMADol (ULTRAM) 50 MG tablet  TAKE 1 TABLET(50 MG) BY MOUTH EVERY 6 HOURS AS NEEDED (Patient not taking: Reported on 03/26/2021) 30 tablet 0   Vitamin D, Ergocalciferol, (DRISDOL) 1.25 MG (50000 UNIT) CAPS capsule Take 1 capsule (50,000 Units total) by mouth 2 (two) times a week. (Patient not taking: Reported on 03/26/2021) 24 capsule 1   No current facility-administered medications on file prior to visit.    Allergies  Allergen Reactions   Amoxicillin Hives and Itching    Has patient had a PCN reaction causing immediate rash, facial/tongue/throat swelling, SOB or lightheadedness with hypotension: No Has patient had a PCN reaction causing severe rash involving mucus membranes or skin necrosis: No Has patient had a PCN reaction that required hospitalization No Has patient had a PCN  reaction occurring within the last 10 years: No If all of the above answers are "NO", then may proceed with Cephalosporin use.    Percocet [Oxycodone-Acetaminophen] Itching   Gabapentin Rash    Made face break out    Social History   Socioeconomic History   Marital status: Single    Spouse name: Not on file   Number of children: Not on file   Years of education: Not on file   Highest education level: Not on file  Occupational History   Not on file  Tobacco Use   Smoking status: Every Day    Current packs/day: 0.25    Average packs/day: 0.3 packs/day for 24.0 years (6.0 ttl pk-yrs)    Types: Cigarettes   Smokeless tobacco: Former   Tobacco comments:    she has cut back - sometimes will not smoke at all.   Vaping Use   Vaping status: Some Days  Substance and Sexual Activity   Alcohol use: Yes    Comment: occ   Drug use: Yes    Types: Marijuana    Comment: occ   Sexual activity: Not on file  Other Topics Concern   Not on file  Social History Narrative   Not on file   Social Determinants of Health   Financial Resource Strain: Not on file  Food Insecurity: Not on file  Transportation Needs: Not on file  Physical Activity: Not on file  Stress: Not on file  Social Connections: Not on file  Intimate Partner Violence: Not on file    Family History  Problem Relation Age of Onset   Hypertension Maternal Aunt    Hypertension Paternal Aunt    Club foot Daughter    Hirschsprung's disease Daughter    Learning disabilities Daughter    Polydactyly Daughter    Hypertension Mother    Diabetes Mother    Heart disease Father    Anesthesia problems Neg Hx    Hypotension Neg Hx    Malignant hyperthermia Neg Hx    Pseudochol deficiency Neg Hx     The following portions of the patient's history were reviewed and updated as appropriate: allergies, current medications, past family history, past medical history, past social history, past surgical history and problem  list.  Review of Systems Pertinent items noted in HPI and remainder of comprehensive ROS otherwise negative.   Objective:  BP (!) 179/112   Pulse 72   Ht 5' 2.5" (1.588 m)   Wt 210 lb (95.3 kg)   LMP  (LMP Unknown)   BMI 37.80 kg/m  Chaperone present CONSTITUTIONAL: Well-developed, well-nourished female in no acute distress.  HENT:  Normocephalic, atraumatic, External right and left ear normal. Oropharynx is clear and moist EYES: Conjunctivae and  EOM are normal. Pupils are equal, round, and reactive to light. No scleral icterus.  NECK: Normal range of motion, supple, no masses.  Normal thyroid.  SKIN: Skin is warm and dry. No rash noted. Not diaphoretic. No erythema. No pallor. NEUROLGIC: Alert and oriented to person, place, and time. Normal reflexes, muscle tone coordination. No cranial nerve deficit noted. PSYCHIATRIC: Normal mood and affect. Normal behavior. Normal judgment and thought content. CARDIOVASCULAR: Normal heart rate noted, regular rhythm RESPIRATORY: Clear to auscultation bilaterally. Effort and breath sounds normal, no problems with respiration noted. BREASTS: Symmetric in size. No masses, skin changes, nipple drainage, or lymphadenopathy. ABDOMEN: Soft, normal bowel sounds, no distention noted.  No tenderness, rebound or guarding.  PELVIC: Normal appearing external genitalia; normal appearing vaginal mucosa and cervix.  No abnormal discharge noted.  Pap smear obtained of vaginal cuff obtained, Cervix and uterus surgical absent, no masses ot tenderness  , MUSCULOSKELETAL: Normal range of motion. No tenderness.  No cyanosis, clubbing, or edema.  2+ distal pulses.   Assessment:  Annual gynecologic examination with pap smear STD testing Hypertension Plan:  Will follow up results of pap smear and manage accordingly. Mammogram scheduled STD testing as per pt request. Pt to continue to see PCP for HTN manangement Routine preventative health maintenance measures  emphasized. Please refer to After Visit Summary for other counseling recommendations.    Hermina Staggers, MD, FACOG Attending Obstetrician & Gynecologist Center for [facility], [facility]

## 2023-07-09 LAB — CERVICOVAGINAL ANCILLARY ONLY
Bacterial Vaginitis (gardnerella): POSITIVE — AB
Candida Glabrata: NEGATIVE
Candida Vaginitis: NEGATIVE
Chlamydia: NEGATIVE
Comment: NEGATIVE
Comment: NEGATIVE
Comment: NEGATIVE
Comment: NEGATIVE
Comment: NEGATIVE
Comment: NORMAL
Neisseria Gonorrhea: NEGATIVE
Trichomonas: NEGATIVE

## 2023-07-10 LAB — HIV ANTIBODY (ROUTINE TESTING W REFLEX): HIV Screen 4th Generation wRfx: NONREACTIVE

## 2023-07-10 LAB — HEPATITIS C ANTIBODY: Hep C Virus Ab: NONREACTIVE

## 2023-07-10 LAB — RPR: RPR Ser Ql: NONREACTIVE

## 2023-07-10 LAB — HEPATITIS B SURFACE ANTIGEN: Hepatitis B Surface Ag: NEGATIVE

## 2023-07-12 ENCOUNTER — Telehealth: Payer: Self-pay

## 2023-07-12 DIAGNOSIS — B9689 Other specified bacterial agents as the cause of diseases classified elsewhere: Secondary | ICD-10-CM

## 2023-07-12 LAB — CYTOLOGY - PAP
Adequacy: ABSENT
Comment: NEGATIVE
Diagnosis: NEGATIVE
High risk HPV: NEGATIVE

## 2023-07-12 MED ORDER — METRONIDAZOLE 500 MG PO TABS
500.0000 mg | ORAL_TABLET | Freq: Two times a day (BID) | ORAL | 0 refills | Status: AC
Start: 2023-07-12 — End: 2023-07-19

## 2023-07-12 NOTE — Telephone Encounter (Signed)
-----   Message from Hermina Staggers sent at 07/12/2023  9:50 AM EDT ----- Please let Meredith York know that her vaginal swab was negative except for BV. Please send in Rx for treatment.  Thanks Casimiro Needle

## 2023-07-12 NOTE — Telephone Encounter (Signed)
I connected with  Meredith York on 07/12/23 by telephone and verified that I am speaking with the correct person using two identifiers.  Pt informed of results and would like treatment. Rx sent.

## 2023-07-13 NOTE — Therapy (Signed)
OUTPATIENT PHYSICAL THERAPY TREATMENT NOTE/ ReCert/ReAuth  Patient Name: Meredith York MRN: [id number] DOB:08-20-1976, 47 y.o., female Today's Date: 07/14/2023  PCP: [name], FNP   REFERRING PROVIDER: Jones Broom, MD      END OF SESSION:   PT End of Session - 07/14/23 0856     Visit Number 17    Number of Visits 22    Date for PT Re-Evaluation 08/27/23    Authorization Type [employer]; [place] [employer] [place]    Authorization Time Period 5/29-9/26, 8 visits,    Authorization - Visit Number 11    Progress Note Due on Visit 20    PT Start Time 0851    PT Stop Time 0930    PT Time Calculation (min) 39 min    Activity Tolerance Patient tolerated treatment well    Behavior During Therapy [facility] for tasks assessed/performed                       Past Medical History:  Diagnosis Date   Carpal tunnel syndrome on right    Chlamydia    Gestational diabetes mellitus    Hypertension    no medicaqtions at this time   Migraines    Ovarian cyst    S/P lumbar discectomy 04/10/2015   Tendonitis    right wrist   Trichomonas    Type 2 diabetes mellitus (HCC)    not currently taking medication   Past Surgical History:  Procedure Laterality Date   ABDOMINAL HYSTERECTOMY     BACK SURGERY     CESAREAN SECTION  12/27/2011   Procedure: CESAREAN SECTION;  Surgeon: Brock Bad, MD;  Location: [facility];  Service: Gynecology;  Laterality: N/A;  Primary cesarean section with delivery of baby girl at 68. Apgars 3/3/6   CESAREAN SECTION N/A 08/03/2016   Procedure: CESAREAN SECTION;  Surgeon: Tereso Newcomer, MD;  Location: [facility];  Service: Obstetrics;  Laterality: N/A;   CHOLECYSTECTOMY  02/2009   DILATION AND CURETTAGE OF UTERUS N/A 06/25/2014   Procedure: DILATATION AND CURETTAGE;  Surgeon: Reva Bores, MD;  Location: [facility];  Service: Gynecology;  Laterality: N/A;   LUMBAR LAMINECTOMY/DECOMPRESSION MICRODISCECTOMY Left 04/10/2015    Procedure: Left L5-S1 Microdiscectomy;  Surgeon: Eldred Manges, MD;  Location: [facility];  Service: Orthopedics;  Laterality: Left;   VAGINAL HYSTERECTOMY N/A 06/04/2020   Procedure: HYSTERECTOMY VAGINAL, MORCELLATION;  Surgeon: Hermina Staggers, MD;  Location: [facility];  Service: Gynecology;  Laterality: N/A;   WISDOM TOOTH EXTRACTION     Patient Active Problem List   Diagnosis Date Noted   Exposure to sexually transmitted disease (STD) 07/08/2023   Flank pain 06/10/2023   Rotator cuff tear, left 01/18/2023   OA (osteoarthritis) of shoulder 12/03/2022   Peroneal tendon tear, left, subsequent encounter 01/17/2021   Recurrent herniation of lumbar disc 10/30/2020   Prediabetes 09/24/2020   Visit for routine gyn exam 11/29/2019   Encounter for screening mammogram for breast cancer 11/29/2019   History of bilateral tubal ligation 11/29/2019   S/P cesarean section 07/14/2016   Hypertension 07/05/2011    REFERRING DIAG: s/p left shoulder cuff /debridment ,SAD DCE 04/06/2023   THERAPY DIAG:  Acute pain of left shoulder  Muscle weakness (generalized)  Stiffness of left shoulder, not elsewhere classified  Rationale for Evaluation and Treatment Rehabilitation  ONSET DATE: 4/16 surgery; Initial issue approx 2 years   SUBJECTIVE:  SUBJECTIVE STATEMENT:  Pt reports tolerating the last PT session. She report primarily only stiffness after lying on her L side. PAIN:  Are you having pain? Yes: NPRS scale: 0-3/10 depending on activity level, lifting, certain sudden movements Pain location: L GH area Pain description: ache, throb Aggravating factors: Certain movement Relieving factors: Rest, Ice packs  PERTINENT HISTORY: High BMI-heavy arm; HTN   PRECAUTIONS: None   WEIGHT BEARING RESTRICTIONS:  No WBAT   FALLS:  Has patient fallen in last 6 months? 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 Partial-thickness midsubstance tear of the mid to anterior supraspinatus tendon footprint measuring up to 10 mm in AP dimension. 2. Moderate degenerative changes of the acromioclavicular joint. High-grade marrow edema within the clavicular head suggests this may represent a source of pain. 3. Mild glenohumeral cartilage degenerative changes.   PATIENT SURVEYS:  FOTO: Perceived function   65%, predicted   68%. 05/25/23=56%   COGNITION: Overall cognitive status: Within functional limits for tasks assessed                                  SENSATION: WFL   POSTURE: Forward ead, rounded shoulders   UPPER EXTREMITY ROM:    Active ROM Right eval Left eval LT 04/22/23 LT 04/28/23 LT 05/04/23 LT 05/06/23 LT 05/19/23 Lt 05/27/23 LT 06/03/23 LT  06/23/23  Shoulder flexion 150 A62; P80 AA120 P125 P135  A120 P150 135 145 145  Shoulder extension              Shoulder abduction 154 A72; P85  P115 P120   125 135 135  Shoulder adduction              Shoulder internal rotation T4 A T1; P40  P 60 P65 P70  Left PSIS Reaches L5  L5  Shoulder external rotation T7 A GT hip; P70  P 90 P90   Reaches back of neck T2 T2  Elbow flexion              Elbow extension              Wrist flexion              Wrist extension              Wrist ulnar deviation              Wrist radial deviation              Wrist  pronation              Wrist supination              (Blank rows = not tested)   UPPER EXTREMITY MMT: L shoulder generally 2+ to 3_ MMT Right eval Left eval Lt  06/08/23 Lt 06/23/23  Shoulder flexion 5   4- 4  Shoulder extension 5      Shoulder abduction 5   3+  pain 4-  Shoulder adduction 5      Shoulder internal rotation 5   4+ 4+  Shoulder external rotation 5   4 4   Middle trapezius        Lower trapezius        Elbow flexion     4+ 4+  Elbow extension        Wrist flexion        Wrist extension        Wrist ulnar deviation        Wrist radial deviation        Wrist pronation        Wrist supination        Grip strength (lbs)        (Blank rows = not tested)   SHOULDER SPECIAL TESTS: Impingement tests:  NT SLAP lesions:  NT Instability tests:  NT Rotator cuff assessment:  NT Biceps assessment:  NT   PALPATION:  TTP peri-GH jt area  TODAY'S TREATMENT:  [facility] Adult PT Treatment:                                                DATE: 07/14/23 Therapeutic Exercise: UBE 5 min L1; 4 min FWD/BWD  S/L Shoulder ER 3x10 3# S/L Shoulder Abd 3x10 2# Supine serratus press Seated shoulder  Shoulder flexion 3x10 1# Supine 90d small circles 4# Supine chest press to serratus press 5# bilat 3x10 Standing alt shoulder flexion and scaption 2x10 Bent over elb ext press 2x10 5#  [facility] Adult PT Treatment:                                                DATE: 07/07/23 Therapeutic Exercise: UBE 5 min L1; 2.5 min FWD/BWD  Wall wash flexion and abd x10 each Standing shoulder flexion and abd 2x10 2# Above shoulder shelf lifting 2x10 4# S/L shoulder ER  2x10 3#, x8 4# Standing serratus lifts c band press  Wall serratus press 2x10 Knee to waist height 2 handed lifts 2x10 5# box L [facility] Adult PT Treatment:                                                DATE: 06/23/23 Therapeutic Exercise: UBE 5 min L1; 2.5 min FWD/BWD  Wall wash flexion and abd x10 each Standing IR 3x10 RTB Standing ER  3x10 RTB Standing scaption and forward raise 1 lb 3x10 Standing shoulder row 3x10 GTB Standing shoulder ext 3x10 GTB Updated HEP to progress strengthening  [facility] Adult PT Treatment:                                                DATE: 06/15/23 Therapeutic Exercise: NuStep  Bicep curls 5 lbs x 20  Scaption and forward raise 3 lbs x 12 Horizontal pull green band x 15 Supine ER 2 x 30 reps, green band , 0 deg and 90 deg abd IR at 90 deg x  30 Diagonal pull green band x 15   4 lbs chest press x 10 added protraction x 10  Quadruped UE lift alternating x 8, added opposite leg (bird dog ) x 8  Standing serratus push green band on wall X 15 , then protraction  Single arm overhead press 2 lbs x 10 reps Towel for Internal rotation x 5 to L3   PATIENT EDUCATION: Education details: Eval findings, POC, HEP, self care  Person educated: Patient Education method: Explanation, Demonstration, Tactile cues, Verbal cues, and Handouts Education comprehension: verbalized understanding, returned demonstration, verbal cues required, and tactile cues required   HOME EXERCISE PROGRAM: Access Code: [email or web address] URL: [email or web address] Date: 06/23/2023 Prepared by: Joellyn Rued  Exercises - Standing Shoulder Row with Anchored Resistance  - 1 x daily - 7 x weekly - 2 sets - 10 reps - 3 hold - Shoulder Extension with Resistance  - 1 x daily - 7 x weekly - 3 sets - 10 reps - 3 hold - Shoulder External Rotation with Anchored Resistance  - 1 x daily - 7 x weekly - 3 sets - 10 reps - Shoulder Internal Rotation with Resistance (Mirrored)  - 1 x daily - 7 x weekly - 3 sets - 10 reps - 3 hold - Sidelying Shoulder External Rotation  - 1 x daily - 7 x weekly - 2 sets - 10 reps - Supine Shoulder Press  - 1 x daily - 7 x weekly - 2 sets - 10 reps - 3 hold - Sidelying Shoulder Abduction Palm Forward  - 1 x daily - 7 x weekly - 2 sets - 10 reps - 3 hold - Standing Shoulder Flexion to 90 Degrees with Dumbbells   - 1 x daily - 7 x weekly - 3 sets - 10 reps - Shoulder Flexion Wall Slide with Towel  - 1 x daily - 7 x weekly - 1-2 sets - 10 reps - Standing Shoulder Abduction Slides at Wall  - 1 x daily - 7 x weekly - 1-2 sets - 10 reps - Sleeper Stretch  - 1 x daily - 7 x weekly - 1 sets - 3 reps - 30 hold   ASSESSMENT:   CLINICAL IMPRESSION:   PT was continued for L shoulder and elbow strengthening. Pt demonstrates l shoulder flexion and abd without shoulder shrug. Abd is more challenging than flexion. Pt is making appropriate progress re: strength and function. Pt tolerated PT the prescribed therex without adverse effects. Pt will continue to benefit from skilled PT to address impairments for improved function and return back to full duty at work.   OBJECTIVE IMPAIRMENTS: decreased ROM, decreased strength, impaired UE functional use, obesity, and pain.    ACTIVITY LIMITATIONS: carrying, lifting, dressing, reach over head, and caring for others   PARTICIPATION LIMITATIONS: meal prep, cleaning, laundry, driving, shopping, and occupation   PERSONAL FACTORS: Fitness, Past/current experiences, Time since onset of injury/illness/exacerbation, and 1 comorbidity: igh BMI-heavy arm  are also affecting patient's functional outcome.    REHAB POTENTIAL: Good   CLINICAL DECISION MAKING: Evolving/moderate complexity   EVALUATION COMPLEXITY: Moderate     GOALS:   SHORT TERM GOALS: Target date: 05/07/23   Pt will be Ind in an initial HEP Baseline: started Goal status: MET   2.  Pt will voice understanding of measures to assist in pain reduction  Baseline: started 05/04/23: able to voice  Goal status: MET   3.  Increase L shoulder PROM: flexion- 120, abd- 120d, ER- 60,  IR 80d Baseline: see flow sheet 05/04/23: PROM: flexion- 120, abd- 120d, ER- 90, IR 65d Goal status: PARTIALLY MET   LONG TERM GOALS: Target date: 08/27/23   Pt will be Ind in a final HEP to maintain achieved LOF Baseline: started Goal  status: Ongoing   2.  Increase L shoulder AROM to within 90% of the R for improved functional use of the L UE Baseline: see flow sheets [place]: 06/23/23= see flow sheet Goal status: Partially Met   3.  Increase L shoulder strength to 4+/5 or greater for improved functional use of the L UE Baseline: 2+ to 3-/5; 06/23/23= see flow sheets Goal status: Partially Met   4.  Pt will be able to lift 4lbs x10 reps above shoulder height to reflect demands consistent with her work Baseline: NT  Goal status: Ongoing   5.  Pt will be able to lift 10lbs x10 reps 2 handed from knee to chest height to reflect demands consistent with her work Baseline: NT Goal status: Ongoing  6. Pt will be able to push a 30lb cart 37ft x 4 to reflect demands consistent with her work  Baseline: NT  Goal status: New goal as of 06/23/23     PLAN:   PT FREQUENCY: 1x/week   PT DURATION: 8 weeks   PLANNED INTERVENTIONS: Therapeutic exercises, Therapeutic activity, Patient/Family education, Self Care, Joint mobilization, Dry Needling, Cryotherapy, Moist heat, Taping, Vasopneumatic device, Traction, Ionotophoresis 4mg /ml Dexamethasone, Manual therapy, and Re-evaluation   PLAN FOR NEXT SESSION: MD visit? Cont vs DC>  assess response to HEP; progress therex as indicated (isometric and periscapular strengthening); use of modalities, manual therapy; and TPDN as indicated. (S/P debridement Surgery 04/06/23)   Joellyn Rued MS, PT 07/14/23 9:30 AM

## 2023-07-14 ENCOUNTER — Ambulatory Visit: Payer: 59

## 2023-07-14 DIAGNOSIS — M25612 Stiffness of left shoulder, not elsewhere classified: Secondary | ICD-10-CM

## 2023-07-14 DIAGNOSIS — M6281 Muscle weakness (generalized): Secondary | ICD-10-CM

## 2023-07-14 DIAGNOSIS — M25512 Pain in left shoulder: Secondary | ICD-10-CM | POA: Diagnosis not present

## 2023-07-16 ENCOUNTER — Ambulatory Visit (AMBULATORY_SURGERY_CENTER): Payer: 59

## 2023-07-16 VITALS — Ht 62.5 in | Wt 210.0 lb

## 2023-07-16 DIAGNOSIS — Z1211 Encounter for screening for malignant neoplasm of colon: Secondary | ICD-10-CM

## 2023-07-16 MED ORDER — NA SULFATE-K SULFATE-MG SULF 17.5-3.13-1.6 GM/177ML PO SOLN
1.0000 | Freq: Once | ORAL | 0 refills | Status: AC
Start: 2023-07-16 — End: 2023-07-16

## 2023-07-16 NOTE — Progress Notes (Signed)
No egg or soy allergy known to patient  No issues known to pt with past sedation with any surgeries or procedures Patient denies ever being told they had issues or difficulty with intubation  No FH of Malignant Hyperthermia Pt is not on diet pills Pt is not on  home 02  Pt is not on blood thinners  Pt has issues with constipation with pain meds, drinks extra water No A fib or A flutter Have any cardiac testing pending--no Pt can ambulate independently Pt denies use of chewing tobacco Discussed diabetic I weight loss medication holds Discussed NSAID holds Checked BMI Pt instructed to use Single[employer]com or GoodRx for a price reduction on prep  Patient's chart reviewed by Cathlyn Parsons CNRA prior to previsit and patient appropriate for the [facility].  Pre visit completed and red dot placed by patient's name on their procedure day (on provider's schedule).

## 2023-07-20 NOTE — Therapy (Addendum)
OUTPATIENT PHYSICAL THERAPY TREATMENT NOTE/ ReCert/ReAuth/DC  Patient Name: Meredith York MRN: [id number] DOB:[birth year], 47 y.o., female Today's Date: 07/21/2023  PCP: [name], FNP   REFERRING PROVIDER: Jones Broom, MD      END OF SESSION:   PT End of Session - 07/21/23 0852     Visit Number 18    Number of Visits 22    Date for PT Re-Evaluation 08/27/23    Authorization Type [employer]; [place] [employer] [place]    Authorization Time Period 5/29-9/26, 8 visits,    Authorization - Visit Number 11    Authorization - Number of Visits 9    Progress Note Due on Visit 20    PT Start Time 0850    PT Stop Time 0930    PT Time Calculation (min) 40 min    Activity Tolerance Patient tolerated treatment well    Behavior During Therapy [facility] for tasks assessed/performed                        Past Medical History:  Diagnosis Date   Carpal tunnel syndrome on right    Chlamydia    Gestational diabetes mellitus    Hypertension    no medicaqtions at this time   Migraines    Ovarian cyst    S/P lumbar discectomy 04/10/2015   Tendonitis    right wrist   Trichomonas    Type 2 diabetes mellitus (HCC)    not currently taking medication   Past Surgical History:  Procedure Laterality Date   ABDOMINAL HYSTERECTOMY     BACK SURGERY     CESAREAN SECTION  12/27/2011   Procedure: CESAREAN SECTION;  Surgeon: Brock Bad, MD;  Location: [facility];  Service: Gynecology;  Laterality: N/A;  Primary cesarean section with delivery of baby girl at 64. Apgars 3/3/6   CESAREAN SECTION N/A 08/03/2016   Procedure: CESAREAN SECTION;  Surgeon: Tereso Newcomer, MD;  Location: [facility];  Service: Obstetrics;  Laterality: N/A;   CHOLECYSTECTOMY  02/2009   DILATION AND CURETTAGE OF UTERUS N/A 06/25/2014   Procedure: DILATATION AND CURETTAGE;  Surgeon: Reva Bores, MD;  Location: [facility];  Service: Gynecology;  Laterality: N/A;   LUMBAR  LAMINECTOMY/DECOMPRESSION MICRODISCECTOMY Left 04/10/2015   Procedure: Left L5-S1 Microdiscectomy;  Surgeon: Eldred Manges, MD;  Location: [facility];  Service: Orthopedics;  Laterality: Left;   Rotator cuff partial repair Left 04/06/2023   VAGINAL HYSTERECTOMY N/A 06/04/2020   Procedure: HYSTERECTOMY VAGINAL, MORCELLATION;  Surgeon: Hermina Staggers, MD;  Location: [facility];  Service: Gynecology;  Laterality: N/A;   WISDOM TOOTH EXTRACTION     Patient Active Problem List   Diagnosis Date Noted   Exposure to sexually transmitted disease (STD) 07/08/2023   Flank pain 06/10/2023   Rotator cuff tear, left 01/18/2023   OA (osteoarthritis) of shoulder 12/03/2022   Peroneal tendon tear, left, subsequent encounter 01/17/2021   Recurrent herniation of lumbar disc 10/30/2020   Prediabetes 09/24/2020   Visit for routine gyn exam 11/29/2019   Encounter for screening mammogram for breast cancer 11/29/2019   History of bilateral tubal ligation 11/29/2019   S/P cesarean section 07/14/2016   Hypertension 07/05/2011    REFERRING DIAG: s/p left shoulder cuff /debridment ,SAD DCE 04/06/2023   THERAPY DIAG:  Acute pain of left shoulder  Muscle weakness (generalized)  Stiffness of left shoulder, not elsewhere classified  Rationale for Evaluation and Treatment Rehabilitation  ONSET  DATE: 4/16 surgery; Initial issue approx 2 years   SUBJECTIVE:                                                                                                                                                                                       SUBJECTIVE STATEMENT:  Pt reports her L shoulder is continuing to gradually  PAIN:  Are you having pain? Yes: NPRS scale: 0-3/10 depending on activity level, lifting, certain sudden movements Pain location: L GH area Pain description: ache, throb Aggravating factors: Certain movement Relieving factors: Rest, Ice packs  PERTINENT HISTORY: High BMI-heavy arm;  HTN   PRECAUTIONS: None   WEIGHT BEARING RESTRICTIONS: No WBAT   FALLS:  Has patient fallen in last 6 months? 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 Partial-thickness midsubstance tear of the mid to anterior supraspinatus tendon footprint measuring up to 10 mm in AP dimension. 2. Moderate degenerative changes of the acromioclavicular joint. High-grade marrow edema within the clavicular head suggests this may represent a source of pain. 3. Mild glenohumeral cartilage degenerative changes.   PATIENT SURVEYS:  FOTO: Perceived function   65%, predicted   68%. 05/25/23=56%   COGNITION: Overall cognitive status: Within functional limits for tasks assessed                                  SENSATION: WFL   POSTURE: Forward ead, rounded shoulders   UPPER EXTREMITY ROM:    Active ROM Right eval Left eval LT 04/22/23 LT 04/28/23 LT 05/04/23 LT 05/06/23 LT 05/19/23 Lt 05/27/23 LT 06/03/23 LT  06/23/23 LT 07/21/23  Shoulder flexion 150 A62; P80 AA120 P125 P135  A120 P150 135 145 145 145  Shoulder extension               Shoulder abduction 154 A72; P85  P115 P120   125 135 135 135  Shoulder adduction               Shoulder internal rotation T4 A T1; P40  P 60 P65 P70  Left PSIS Reaches L5  L5 L3  Shoulder  external rotation T7 A GT hip; P70  P 90 P90   Reaches back of neck T2 T2 T4  Elbow flexion               Elbow extension               Wrist flexion               Wrist extension                Wrist ulnar deviation               Wrist radial deviation               Wrist pronation               Wrist supination               (Blank rows = not tested)   UPPER EXTREMITY MMT: L shoulder generally 2+ to 3_ MMT Right eval Left eval Lt  06/08/23 Lt 06/23/23 LT 07/21/23  Shoulder flexion 5   4- 4 4+  Shoulder extension 5       Shoulder abduction 5   3+ pain 4- 4+5  Shoulder adduction 5       Shoulder internal rotation 5   4+ 4+ 5  Shoulder external rotation 5   4 4  4+  Middle trapezius         Lower trapezius         Elbow flexion     4+ 4+ 5  Elbow extension       5  Wrist flexion         Wrist extension         Wrist ulnar deviation         Wrist radial deviation         Wrist pronation         Wrist supination         Grip strength (lbs)         (Blank rows = not tested)   SHOULDER SPECIAL TESTS: Impingement tests:  NT SLAP lesions:  NT Instability tests:  NT Rotator cuff assessment:  NT Biceps assessment:  NT   PALPATION:  TTP peri-GH jt area  TODAY'S TREATMENT:  [facility] Adult PT Treatment:                                                DATE: 07/21/23 Therapeutic Exercise: UBE 5 min L1; 4 min FWD/BWD  S/L Shoulder ER 2x10 4# S/L Shoulder Abd 3x10 3# Standing serratus flexion c roller and RTB Standing bilat ER 2x10 GTB Standing bilat hor abd star pattern 2x5 GTB  Shoulder blade 90d flexion and scaption to fatigue Standing tricep presses 2x10 MMT L shoulder AROM L shoulder Therapeutic Activity: Above shoulder lifts x10 4 lbs Shed push 40# 63ft x2  [facility] Adult PT Treatment:                                                DATE: 07/14/23 Therapeutic Exercise: UBE 5 min L1; 4 min FWD/BWD  S/L Shoulder ER 3x10 3# S/L Shoulder Abd 3x10 2# Supine serratus press Seated shoulder  Shoulder  flexion 3x10 1# Supine 90d small circles 4# Supine chest press to serratus press 5# bilat 3x10 Standing alt shoulder flexion and scaption 2x10 Bent over elb ext press  2x10 5#  [facility] Adult PT Treatment:                                                DATE: 07/07/23 Therapeutic Exercise: UBE 5 min L1; 2.5 min FWD/BWD  Wall wash flexion and abd x10 each Standing shoulder flexion and abd 2x10 2# Above shoulder shelf lifting 2x10 4# S/L shoulder ER  2x10 3#, x8 4# Standing serratus lifts c band press  Wall serratus press 2x10 Knee to waist height 2 handed lifts 2x10 5# box  PATIENT EDUCATION: Education details: Eval findings, POC, HEP, self care  Person educated: Patient Education method: Explanation, Demonstration, Tactile cues, Verbal cues, and Handouts Education comprehension: verbalized understanding, returned demonstration, verbal cues required, and tactile cues required   HOME EXERCISE PROGRAM: Access Code: [email or web address] URL: [email or web address] Date: 06/23/2023 Prepared by: Joellyn Rued  Exercises - Standing Shoulder Row with Anchored Resistance  - 1 x daily - 7 x weekly - 2 sets - 10 reps - 3 hold - Shoulder Extension with Resistance  - 1 x daily - 7 x weekly - 3 sets - 10 reps - 3 hold - Shoulder External Rotation with Anchored Resistance  - 1 x daily - 7 x weekly - 3 sets - 10 reps - Shoulder Internal Rotation with Resistance (Mirrored)  - 1 x daily - 7 x weekly - 3 sets - 10 reps - 3 hold - Sidelying Shoulder External Rotation  - 1 x daily - 7 x weekly - 2 sets - 10 reps - Supine Shoulder Press  - 1 x daily - 7 x weekly - 2 sets - 10 reps - 3 hold - Sidelying Shoulder Abduction Palm Forward  - 1 x daily - 7 x weekly - 2 sets - 10 reps - 3 hold - Standing Shoulder Flexion to 90 Degrees with Dumbbells  - 1 x daily - 7 x weekly - 3 sets - 10 reps - Shoulder Flexion Wall Slide with Towel  - 1 x daily - 7 x weekly - 1-2 sets - 10 reps - Standing Shoulder Abduction Slides at Wall  - 1 x daily - 7 x weekly - 1-2 sets - 10 reps - Sleeper Stretch  - 1 x daily - 7 x weekly - 1 sets - 3 reps - 30 hold   ASSESSMENT:   CLINICAL IMPRESSION:    PT was continued for L shoulder and elbow strengthening with increased demand. Completed several assessments:  AROM improved for ER and IR; and strength, over shoulder lifting goal and sled pushing  goals were met. Pt continues to make appropriate progress toward DC and return to work. Pt has 1 more scheduled appt. Will finalize HEP and anticipate DC at that time.  OBJECTIVE IMPAIRMENTS: decreased ROM, decreased strength, impaired UE functional use, obesity, and pain.    ACTIVITY LIMITATIONS: carrying, lifting, dressing, reach over head, and caring for others   PARTICIPATION LIMITATIONS: meal prep, cleaning, laundry, driving, shopping, and occupation   PERSONAL FACTORS: Fitness, Past/current experiences, Time since onset of injury/illness/exacerbation, and 1 comorbidity: igh BMI-heavy arm  are also affecting patient's functional outcome.    REHAB POTENTIAL: Good   CLINICAL DECISION MAKING:  Evolving/moderate complexity   EVALUATION COMPLEXITY: Moderate     GOALS:   SHORT TERM GOALS: Target date: 05/07/23   Pt will be Ind in an initial HEP Baseline: started Goal status: MET   2.  Pt will voice understanding of measures to assist in pain reduction  Baseline: started 05/04/23: able to voice  Goal status: MET   3.  Increase L shoulder PROM: flexion- 120, abd- 120d, ER- 60, IR 80d Baseline: see flow sheet 05/04/23: PROM: flexion- 120, abd- 120d, ER- 90, IR 65d Goal status: PARTIALLY MET   LONG TERM GOALS: Target date: 08/27/23   Pt will be Ind in a final HEP to maintain achieved LOF Baseline: started Goal status: Ongoing   2.  Increase L shoulder AROM to within 90% of the R for improved functional use of the L UE Baseline: see flow sheets [place]: 06/23/23= see flow sheet Goal status: Partially Met   3.  Increase L shoulder strength to 4+/5 or greater for improved functional use of the L UE Baseline: 2+ to 3-/5; 06/23/23= see flow sheets 07/21/23: see flow sheet Goal status: Met    4.  Pt will be able to lift 4lbs x10 reps above shoulder height to reflect demands consistent with her work Baseline: NT   Goal status: Ongoing   5.  Pt will be able to lift 10lbs x10 reps 2 handed from knee to chest height to reflect demands consistent with her work Baseline: NT Goal status: Ongoing  6. Pt will be able to push a 30lb cart 46ft x 4 to reflect demands consistent with her work  Baseline: NT  Goal status: New goal as of 06/23/23. 07/21/23=MET     PLAN:   PT FREQUENCY: 1x/week   PT DURATION: 8 weeks   PLANNED INTERVENTIONS: Therapeutic exercises, Therapeutic activity, Patient/Family education, Self Care, Joint mobilization, Dry Needling, Cryotherapy, Moist heat, Taping, Vasopneumatic device, Traction, Ionotophoresis 4mg /ml Dexamethasone, Manual therapy, and Re-evaluation   PLAN FOR NEXT SESSION: MD visit? Cont vs DC>  assess response to HEP; progress therex as indicated (isometric and periscapular strengthening); use of modalities, manual therapy; and TPDN as indicated. (S/P debridement Surgery 04/06/23)   Joellyn Rued MS, PT 07/21/23 2:11 PM  PHYSICAL THERAPY DISCHARGE SUMMARY  Visits from Start of Care: 18  Current functional level related to goals / functional outcomes: See clinical impression and PT goals    Remaining deficits: See clinical impression and PT goals    Education / Equipment: HEP, pt Ed   Patient agrees to discharge. Patient goals were partially met. Patient is being discharged due to not returning since the last visit.   Nashia Remus MS, PT 12/17/23 3:06 PM

## 2023-07-21 ENCOUNTER — Encounter: Payer: Self-pay | Admitting: Physical Medicine and Rehabilitation

## 2023-07-21 ENCOUNTER — Ambulatory Visit: Payer: 59

## 2023-07-21 ENCOUNTER — Ambulatory Visit (INDEPENDENT_AMBULATORY_CARE_PROVIDER_SITE_OTHER): Payer: 59 | Admitting: Physical Medicine and Rehabilitation

## 2023-07-21 DIAGNOSIS — M5416 Radiculopathy, lumbar region: Secondary | ICD-10-CM | POA: Diagnosis not present

## 2023-07-21 DIAGNOSIS — M961 Postlaminectomy syndrome, not elsewhere classified: Secondary | ICD-10-CM

## 2023-07-21 DIAGNOSIS — G8929 Other chronic pain: Secondary | ICD-10-CM

## 2023-07-21 DIAGNOSIS — M5442 Lumbago with sciatica, left side: Secondary | ICD-10-CM

## 2023-07-21 DIAGNOSIS — M25512 Pain in left shoulder: Secondary | ICD-10-CM

## 2023-07-21 DIAGNOSIS — M6281 Muscle weakness (generalized): Secondary | ICD-10-CM

## 2023-07-21 DIAGNOSIS — M25612 Stiffness of left shoulder, not elsewhere classified: Secondary | ICD-10-CM

## 2023-07-21 NOTE — Progress Notes (Signed)
Meredith York - 47 y.o. female MRN [id number]  Date of birth: [birth year]  Office Visit Note: Visit Date: 07/21/2023 PCP: [name], FNP Referred by: [name], FNP  Subjective: Chief Complaint  Patient presents with   Lower Back - Pain   HPI: Meredith York is a 47 y.o. female who comes in today as a self referral for evaluation of chronic, worsening and severe bilateral lower back pain radiating down left leg. Pain ongoing for several years, worsens with movement and activity. She describes pain as stiffness and sore sensation, currently rates as 8 out of 10. Some relief of pain with home exercise regimen, rest and use of medications. Lumbar MRI imaging from 2023 exhibits prior left hemilaminotomy and discectomy at L5-S1, there is residual/recurrent shallow broad-based central disc protrusion at this level contributing to lateral recess narrowing. No high grade spinal canal stenosis. History of left L5-S1 microdiscectomy with Dr. Annell Greening in 2016, states she felt good post surgery for about 5 years. Patient underwent multiple lumbar epidural steroid injections in our office in 2022, most recent was left L5-S1 interlaminar epidural steroid injection on 01/23/2021. She reports greater than 50% relief of pain for almost 2 years with this procedure. Patient was recently placed on light duty at work. Patent denies focal weakness, numbness and tingling. No recent trauma or falls.     Review of Systems  Musculoskeletal:  Positive for back pain.  Neurological:  Negative for tingling, sensory change, focal weakness and weakness.  All other systems reviewed and are negative.  Otherwise per HPI.  Assessment & Plan: Visit Diagnoses:    ICD-10-CM   1. Chronic bilateral low back pain with left-sided sciatica  M54.42 Ambulatory referral to Physical Medicine Rehab   G89.29     2. Lumbar radiculopathy  M54.16 Ambulatory referral to Physical Medicine Rehab    3. Post laminectomy syndrome  M96.1  Ambulatory referral to Physical Medicine Rehab       Plan: Findings:  Chronic, worsening and severe bilateral lower back pain radiating down left leg. Patient continues to have severe pain despite good conservative therapies such as home exercise regimen, rest and use of medications. Patients clinical presentation and exam are consistent with more of L5 nerve pattern. Next step is to perform diagnostic and hopefully therapeutic left L5-S1 interlaminar epidural steroid injection under fluoroscopic guidance. She is not currently taking anticoagulant medication. If good relief of pain with injection we can repeat this procedure infrequently as needed. Patient has no questions regarding injection procedure. If her pain persists, we do feel she would be good candidate for spinal cord stimulator. No red flag symptoms noted upon exam today.     Meds & Orders: No orders of the defined types were placed in this encounter.   Orders Placed This Encounter  Procedures   Ambulatory referral to Physical Medicine Rehab    Follow-up: Return for Left L5-S1 interlaminar epidural steroid injection.   Procedures: No procedures performed      Clinical History: CLINICAL DATA:  Radiculopathy, lumbar region. Weakness of left lower extremity. Low back pain, greater than 6 weeks; low back pain, progressive neurologic deficit; low back pain, prior surgery, new symptoms; worsening low back pain with left lower extremity radiculopathy and weakness. Additional history provided by scanning technologist: Patient reports low back pain that radiates down left leg into knee, numbness, difficulty walking for approximately 1 month, prior lumbar spine surgery in 2016.  COMPARISON:  Radiographs of the lumbar spine 09/11/2020. lumbar spine MRI  01/29/2015.   FINDINGS: Segmentation: For the purposes of this dictation, five lumbar vertebrae are assumed and the caudal most well-formed intervertebral disc is designated  L5-S1.   Alignment: Straightening of the expected lumbar lordosis. No significant spondylolisthesis.   Vertebrae: Vertebral body height is maintained. No focal suspicious osseous lesion or significant marrow edema.   Conus medullaris and cauda equina: Conus extends to the L1-L2 level. No signal abnormality within the visualized distal spinal cord.   Paraspinal and other soft tissues: No abnormality identified within included portions of the abdomen/retroperitoneum. Postsurgical changes to the lower dorsal lumbar soft tissues.   Disc levels:   Unless otherwise stated, the level by level findings below have not significantly changed since prior MRI 01/29/2015   Mild L5-S1 disc degeneration. Disc height and hydration are maintained at the remaining lumbar levels.   There is no significant disc herniation, spinal canal stenosis or neural foraminal narrowing at the T12-L1 through L2-L3 levels.   L3-L4: Mild facet arthrosis on the left. Minimal relative left subarticular narrowing without nerve root impingement. Central canal patent. No significant foraminal stenosis.   L4-L5: New from the prior MRI, there is a left foraminal/extraforaminal disc extrusion (series 5, image 28) (series 8, image 28) (series 2, image 12). Mild facet arthrosis/ligamentum flavum hypertrophy (greater on the left). Minimal relative left subarticular narrowing without nerve root impingement. Central canal patent. Progressive moderate left foraminal stenosis with the disc extrusion contacting the exiting left L4 nerve root within and beyond the left neural foramen (series 2, image 12) (series 5, image 28). No significant foraminal stenosis on the right.   L5-S1: Since the prior MRI, there is been interval left hemilaminotomy and discectomy. Disc bulge with endplate spurring. Residual/recurrent shallow broad-based central disc protrusion. Facet arthrosis (greater on the left). The residual/recurrent  disc protrusion contributes to moderate left and mild right subarticular narrowing with contact upon the left greater than right descending S1 nerve roots (series 4, image 34). Central canal patent. Mild/moderate bilateral neural foraminal narrowing, progressed.   IMPRESSION: Comparison is made to the prior MRI of 08/30/2015. Lumbar spondylosis and postsurgical changes as outlined and with findings most notably as follows.   At L4-L5, there is a new left foraminal/extraforaminal disc extrusion. Mild facet arthrosis/ligamentum flavum hypertrophy. Progressive moderate left foraminal stenosis with the disc extrusion contacting the exiting left L4 nerve root within and beyond the left neural foramen. Minimal relative left subarticular narrowing without nerve root impingement.   At L5-S1, there has been interval left hemilaminotomy and discectomy. Mild disc degeneration. Disc bulge with endplate spurring. Residual/recurrent shallow broad-based central disc protrusion. Facet arthrosis (greater on the left). The residual/recurrent disc protrusion contributes to moderate left and mild right subarticular narrowing with contact upon the left greater than right descending S1 nerve roots. Mild/moderate bilateral neural foraminal narrowing, progressed.     Electronically Signed   By: Jackey Loge DO   On: 10/22/2020 14:45   She reports that she has quit smoking. Her smoking use included cigarettes. She has a 6 pack-year smoking history. She has quit using smokeless tobacco.  Recent Labs    06/08/23 1655  HGBA1C 6.6*    Objective:  VS:  HT:    WT:   BMI:     BP:   HR: bpm  TEMP: ( )  RESP:  Physical Exam Vitals and nursing note reviewed.  HENT:     Head: Normocephalic and atraumatic.     Right Ear: External ear normal.  Left Ear: External ear normal.     Nose: Nose normal.     Mouth/Throat:     Mouth: Mucous membranes are moist.  Eyes:     Extraocular Movements:  Extraocular movements intact.  Cardiovascular:     Rate and Rhythm: Normal rate.     Pulses: Normal pulses.  Pulmonary:     Effort: Pulmonary effort is normal.  Abdominal:     General: Abdomen is flat. There is no distension.  Musculoskeletal:        General: Tenderness present.     Cervical back: Normal range of motion.     Comments: Patient rises from seated position to standing without difficulty. Good lumbar range of motion. No pain noted with facet loading. 5/5 strength noted with bilateral hip flexion, knee flexion/extension, ankle dorsiflexion/plantarflexion and EHL. No clonus noted bilaterally. No pain upon palpation of greater trochanters. No pain with internal/external rotation of bilateral hips. Sensation intact bilaterally. Dysesthesias noted to left L5 dermatome. Negative slump test bilaterally. Ambulates without aid, gait steady.     Skin:    General: Skin is warm and dry.     Capillary Refill: Capillary refill takes less than 2 seconds.  Neurological:     General: No focal deficit present.     Mental Status: She is alert and oriented to person, place, and time.  Psychiatric:        Mood and Affect: Mood normal.        Behavior: Behavior normal.     Ortho Exam  Imaging: No results found.  Past Medical/Family/Surgical/Social History: Medications & Allergies reviewed per EMR, new medications updated. Patient Active Problem List   Diagnosis Date Noted   Exposure to sexually transmitted disease (STD) 07/08/2023   Flank pain 06/10/2023   Rotator cuff tear, left 01/18/2023   OA (osteoarthritis) of shoulder 12/03/2022   Peroneal tendon tear, left, subsequent encounter 01/17/2021   Recurrent herniation of lumbar disc 10/30/2020   Prediabetes 09/24/2020   Visit for routine gyn exam 11/29/2019   Encounter for screening mammogram for breast cancer 11/29/2019   History of bilateral tubal ligation 11/29/2019   S/P cesarean section 07/14/2016   Hypertension 07/05/2011    Past Medical History:  Diagnosis Date   Carpal tunnel syndrome on right    Chlamydia    Gestational diabetes mellitus    Hypertension    no medicaqtions at this time   Migraines    Ovarian cyst    S/P lumbar discectomy 04/10/2015   Tendonitis    right wrist   Trichomonas    Type 2 diabetes mellitus (HCC)    not currently taking medication   Family History  Problem Relation Age of Onset   Hypertension Mother    Diabetes Mother    Heart disease Father    Hypertension Maternal Aunt    Hypertension Paternal Investment banker, operational foot Daughter    Hirschsprung's disease Daughter    Learning disabilities Daughter    Polydactyly Daughter    Anesthesia problems Neg Hx    Hypotension Neg Hx    Malignant hyperthermia Neg Hx    Pseudochol deficiency Neg Hx    Colon cancer Neg Hx    Colon polyps Neg Hx    Esophageal cancer Neg Hx    Rectal cancer Neg Hx    Stomach cancer Neg Hx    Past Surgical History:  Procedure Laterality Date   ABDOMINAL HYSTERECTOMY     BACK SURGERY     CESAREAN  SECTION  12/27/2011   Procedure: CESAREAN SECTION;  Surgeon: Brock Bad, MD;  Location: [facility];  Service: Gynecology;  Laterality: N/A;  Primary cesarean section with delivery of baby girl at 61. Apgars 3/3/6   CESAREAN SECTION N/A 08/03/2016   Procedure: CESAREAN SECTION;  Surgeon: Tereso Newcomer, MD;  Location: [facility];  Service: Obstetrics;  Laterality: N/A;   CHOLECYSTECTOMY  02/2009   DILATION AND CURETTAGE OF UTERUS N/A 06/25/2014   Procedure: DILATATION AND CURETTAGE;  Surgeon: Reva Bores, MD;  Location: [facility];  Service: Gynecology;  Laterality: N/A;   LUMBAR LAMINECTOMY/DECOMPRESSION MICRODISCECTOMY Left 04/10/2015   Procedure: Left L5-S1 Microdiscectomy;  Surgeon: Eldred Manges, MD;  Location: [facility];  Service: Orthopedics;  Laterality: Left;   Rotator cuff partial repair Left 04/06/2023   VAGINAL HYSTERECTOMY N/A 06/04/2020   Procedure: HYSTERECTOMY VAGINAL, MORCELLATION;   Surgeon: Hermina Staggers, MD;  Location: [facility];  Service: Gynecology;  Laterality: N/A;   WISDOM TOOTH EXTRACTION     Social History   Occupational History   Not on file  Tobacco Use   Smoking status: Former    Current packs/day: 0.25    Average packs/day: 0.3 packs/day for 24.0 years (6.0 ttl pk-yrs)    Types: Cigarettes   Smokeless tobacco: Former   Tobacco comments:    she has cut back - sometimes will not smoke at all.   Vaping Use   Vaping status: Some Days  Substance and Sexual Activity   Alcohol use: Yes    Comment: occ   Drug use: Yes    Types: Marijuana    Comment: occ   Sexual activity: Not on file

## 2023-07-21 NOTE — Progress Notes (Signed)
Functional Pain Scale - descriptive words and definitions  Distressing (6)    Pain is present/unable to complete most ADLs limited by pain/sleep is difficult and active distraction is only marginal. Moderate range order  Average Pain 7  Lower back pain all the way across that radiates into the left leg to the knee and can go into the calf

## 2023-07-26 ENCOUNTER — Telehealth: Payer: Self-pay | Admitting: Gastroenterology

## 2023-07-26 ENCOUNTER — Telehealth: Payer: Self-pay | Admitting: Physical Medicine and Rehabilitation

## 2023-07-26 DIAGNOSIS — Z1211 Encounter for screening for malignant neoplasm of colon: Secondary | ICD-10-CM

## 2023-07-26 MED ORDER — NA SULFATE-K SULFATE-MG SULF 17.5-3.13-1.6 GM/177ML PO SOLN
1.0000 | Freq: Once | ORAL | 0 refills | Status: AC
Start: 2023-07-26 — End: 2023-07-26

## 2023-07-26 NOTE — Telephone Encounter (Signed)
Patient needing prep medication sent into pharmacy. Please advise.   Thank you

## 2023-07-26 NOTE — Telephone Encounter (Signed)
Patient called returning a call.CB#[phone]

## 2023-07-26 NOTE — Telephone Encounter (Signed)
Spoke with pt- sent Suprep to [facility] on [address]

## 2023-07-27 ENCOUNTER — Ambulatory Visit: Payer: 59 | Admitting: Nurse Practitioner

## 2023-07-27 ENCOUNTER — Encounter: Payer: Self-pay | Admitting: Nurse Practitioner

## 2023-07-27 ENCOUNTER — Telehealth: Payer: Self-pay

## 2023-07-27 ENCOUNTER — Other Ambulatory Visit: Payer: Self-pay | Admitting: Physical Medicine and Rehabilitation

## 2023-07-27 VITALS — BP 136/90 | HR 79 | Temp 99.4°F | Ht 63.0 in | Wt 208.8 lb

## 2023-07-27 DIAGNOSIS — E1169 Type 2 diabetes mellitus with other specified complication: Secondary | ICD-10-CM | POA: Diagnosis not present

## 2023-07-27 DIAGNOSIS — I10 Essential (primary) hypertension: Secondary | ICD-10-CM

## 2023-07-27 DIAGNOSIS — Z6836 Body mass index (BMI) 36.0-36.9, adult: Secondary | ICD-10-CM

## 2023-07-27 DIAGNOSIS — E669 Obesity, unspecified: Secondary | ICD-10-CM

## 2023-07-27 MED ORDER — DIAZEPAM 5 MG PO TABS: ORAL_TABLET | ORAL | 0 refills | Status: DC

## 2023-07-27 MED ORDER — RYBELSUS 7 MG PO TABS
1.0000 | ORAL_TABLET | Freq: Every day | ORAL | 1 refills | Status: DC
Start: 2023-07-27 — End: 2023-08-26

## 2023-07-27 MED ORDER — OLMESARTAN MEDOXOMIL 40 MG PO TABS: 40.0000 mg | ORAL_TABLET | Freq: Every day | ORAL | 1 refills | Status: DC

## 2023-07-27 NOTE — Telephone Encounter (Signed)
LVM to return call to schedule injection 

## 2023-07-27 NOTE — Progress Notes (Addendum)
Meredith York, CMA,acting as a Neurosurgeon for Meredith Felts, FNP.,have documented all relevant documentation on the behalf of Meredith Felts, FNP,as directed by  Meredith Felts, FNP while in the presence of Meredith Felts, FNP.  Subjective:  Patient ID: Meredith York , female    DOB: 10/21/1976 , 47 y.o.   MRN: [id number]  Chief Complaint  Patient presents with   Hypertension    HPI  Patient presents today for a BP, pre DM follow up. Patient reports compliance with medications, patient denies any headache, chest pain or SOB. Patient stated she was supposed to start a med for diabetes but nothing was ever called in. She was started on 20 mg olmesartan but her blood pressure was still high so it was increased to 40 mg. She is now having pain down to her left side but is also on the right side, she is due for an injection on 08/12/23. She is having her colonoscopy on Friday. Unable to tolerate metformin. Causes nausea and GI upset.   Wt Readings from Last 3 Encounters: 07/27/23 : 208 lb 12.8 oz (94.7 kg) 07/16/23 : 210 lb (95.3 kg) 07/08/23 : 210 lb (95.3 kg)  She is back at work and moving around more since having her left rotator cuff surgery.      Past Medical History:  Diagnosis Date   Carpal tunnel syndrome on right    Chlamydia    Gestational diabetes mellitus    Hypertension    no medicaqtions at this time   Migraines    Ovarian cyst    S/P lumbar discectomy 04/10/2015   Tendonitis    right wrist   Trichomonas    Type 2 diabetes mellitus (HCC)    not currently taking medication     Family History  Problem Relation Age of Onset   Hypertension Mother    Diabetes Mother    Heart disease Father    Hypertension Maternal Aunt    Hypertension Paternal Aunt    Club foot Daughter    Hirschsprung's disease Daughter    Learning disabilities Daughter    Polydactyly Daughter    Anesthesia problems Neg Hx    Hypotension Neg Hx    Malignant hyperthermia Neg Hx    Pseudochol deficiency  Neg Hx    Colon cancer Neg Hx    Colon polyps Neg Hx    Esophageal cancer Neg Hx    Rectal cancer Neg Hx    Stomach cancer Neg Hx      Current Outpatient Medications:    atorvastatin (LIPITOR) 20 MG tablet, Take 1 tablet (20 mg total) by mouth daily., Disp: 30 tablet, Rfl: 5   diazepam (VALIUM) 5 MG tablet, Take one tablet by mouth with food one hour prior to procedure. May repeat 30 minutes prior if needed. (Patient not taking: Reported on 07/30/2023), Disp: 2 tablet, Rfl: 0   Semaglutide (RYBELSUS) 7 MG TABS, Take 1 tablet (7 mg total) by mouth daily. Take 30 minutes before breakfast (Patient not taking: Reported on 07/30/2023), Disp: 90 tablet, Rfl: 1   olmesartan (BENICAR) 40 MG tablet, Take 1 tablet (40 mg total) by mouth daily., Disp: 90 tablet, Rfl: 1  Current Facility-Administered Medications:    methylPREDNISolone acetate (DEPO-MEDROL) injection 80 mg, 80 mg, Other, Once,    Allergies  Allergen Reactions   Amoxicillin Hives and Itching    Has patient had a PCN reaction causing immediate rash, facial/tongue/throat swelling, SOB or lightheadedness with hypotension: No Has patient had a PCN  reaction causing severe rash involving mucus membranes or skin necrosis: No Has patient had a PCN reaction that required hospitalization No Has patient had a PCN reaction occurring within the last 10 years: No If all of the above answers are "NO", then may proceed with Cephalosporin use.    Percocet [Oxycodone-Acetaminophen] Itching   Gabapentin Rash    Made face break out     Review of Systems  Constitutional: Negative.   Eyes: Negative.   Endocrine: Negative for polydipsia, polyphagia and polyuria.  Musculoskeletal: Negative.   Skin: Negative.   Psychiatric/Behavioral: Negative.       Today's Vitals   07/27/23 1427 07/27/23 1455  BP: (!) 144/90 (!) 136/90  Pulse: 79   Temp: 99.4 F (37.4 C)   Weight: 208 lb 12.8 oz (94.7 kg)   Height: 5\' 3"  (1.6 m)   PainSc: 8    PainLoc: Back     Body mass index is 36.99 kg/m.  Wt Readings from Last 3 Encounters:  07/30/23 210 lb (95.3 kg)  07/27/23 208 lb 12.8 oz (94.7 kg)  07/16/23 210 lb (95.3 kg)      Objective:  Physical Exam Vitals reviewed.  Constitutional:      General: She is not in acute distress.    Appearance: Normal appearance. She is obese.  Cardiovascular:     Rate and Rhythm: Normal rate and regular rhythm.     Pulses: Normal pulses.     Heart sounds: Normal heart sounds. No murmur heard. Pulmonary:     Effort: Pulmonary effort is normal. No respiratory distress.     Breath sounds: Normal breath sounds. No wheezing.  Skin:    General: Skin is warm and dry.     Capillary Refill: Capillary refill takes less than 2 seconds.  Neurological:     General: No focal deficit present.     Mental Status: She is alert and oriented to person, place, and time.     Cranial Nerves: No cranial nerve deficit.     Motor: No weakness.  Psychiatric:        Mood and Affect: Mood normal.        Behavior: Behavior normal.        Thought Content: Thought content normal.        Judgment: Judgment normal.         Assessment And Plan:  Primary hypertension Assessment & Plan: Blood pressure still slightly elevated however it is better than her previous visit.  Will send olmesartan 40 mg to take 1 pill instead of 2-20 mg tablets.  Continue focusing on low-salt diet.  Orders: -     Olmesartan Medoxomil; Take 1 tablet (40 mg total) by mouth daily.  Dispense: 90 tablet; Refill: 1  Type 2 diabetes mellitus with obesity (HCC) Assessment & Plan: Sample of Rybelsus given to patient in the office with instructions to take 30 minutes before breakfast.  discussed side effects to include nausea, difficulty swallowing and abdominal pain.  Denies family history of medullary thyroid cancer or personal history of pancreatitis   Orders: -     Rybelsus; Take 1 tablet (7 mg total) by mouth daily. Take 30 minutes before breakfast  (Patient not taking: Reported on 07/30/2023)  Dispense: 90 tablet; Refill: 1  Body mass index 36.0-36.9, adult Assessment & Plan: She is encouraged to strive for BMI less than 30 to decrease cardiac risk. Advised to aim for at least 150 minutes of exercise per week.  Return for uncontrolled DM check-3 months.  Patient was given opportunity to ask questions. Patient verbalized understanding of the plan and was able to repeat key elements of the plan. All questions were answered to their satisfaction.    Jeanell Sparrow, FNP, have reviewed all documentation for this visit. The documentation on 08/16/23 for the exam, diagnosis, procedures, and orders are all accurate and complete.   IF YOU HAVE BEEN REFERRED TO A SPECIALIST, IT MAY TAKE 1-2 WEEKS TO SCHEDULE/PROCESS THE REFERRAL. IF YOU HAVE NOT HEARD FROM US/SPECIALIST IN TWO WEEKS, PLEASE GIVE [place] A CALL AT [phone].

## 2023-07-27 NOTE — Telephone Encounter (Signed)
Patient is scheduled for injection on 08/12/23. Needs pre procedure Valium sent to [facility]

## 2023-07-28 ENCOUNTER — Ambulatory Visit: Payer: 59

## 2023-07-28 NOTE — Assessment & Plan Note (Signed)
Blood pressure still slightly elevated however it is better than her previous visit.  Will send olmesartan 40 mg to take 1 pill instead of 2-20 mg tablets.  Continue focusing on low-salt diet.

## 2023-07-28 NOTE — Assessment & Plan Note (Addendum)
Sample of Rybelsus given to patient in the office with instructions to take 30 minutes before breakfast.  discussed side effects to include nausea, difficulty swallowing and abdominal pain.  Denies family history of medullary thyroid cancer or personal history of pancreatitis

## 2023-07-30 ENCOUNTER — Ambulatory Visit (AMBULATORY_SURGERY_CENTER): Payer: 59 | Admitting: Gastroenterology

## 2023-07-30 ENCOUNTER — Encounter: Payer: Self-pay | Admitting: Gastroenterology

## 2023-07-30 VITALS — BP 157/86 | HR 69 | Temp 97.1°F | Resp 18 | Ht 62.5 in | Wt 210.0 lb

## 2023-07-30 DIAGNOSIS — Z1211 Encounter for screening for malignant neoplasm of colon: Secondary | ICD-10-CM | POA: Diagnosis not present

## 2023-07-30 DIAGNOSIS — D123 Benign neoplasm of transverse colon: Secondary | ICD-10-CM | POA: Diagnosis not present

## 2023-07-30 HISTORY — PX: COLONOSCOPY: SHX174

## 2023-07-30 MED ORDER — SODIUM CHLORIDE 0.9 % IV SOLN: 500.0000 mL | Freq: Once | INTRAVENOUS | Status: DC

## 2023-07-30 NOTE — Progress Notes (Signed)
Called to room to assist during endoscopic procedure.  Patient ID and intended procedure confirmed with present staff. Received instructions for my participation in the procedure from the performing physician.  

## 2023-07-30 NOTE — Progress Notes (Signed)
Sedate, gd SR, tolerated procedure well, VSS, report to RN 

## 2023-07-30 NOTE — Op Note (Signed)
[facility] Patient Name: Meredith York Procedure Date: 07/30/2023 10:51 AM MRN: [id number] Endoscopist: Lorin Picket E. Tomasa Rand , MD, [id number] Age: 47 Referring MD:  Date of Birth: 1976/07/16 Gender: Female Account #: [id number] Procedure:                Colonoscopy Indications:              Screening for colorectal malignant neoplasm, This                            is the patient's first colonoscopy Medicines:                Monitored Anesthesia Care Procedure:                Pre-Anesthesia Assessment:                           - Prior to the procedure, a History and Physical                            was performed, and patient medications and                            allergies were reviewed. The patient's tolerance of                            previous anesthesia was also reviewed. The risks                            and benefits of the procedure and the sedation                            options and risks were discussed with the patient.                            All questions were answered, and informed consent                            was obtained. Prior Anticoagulants: The patient has                            taken no anticoagulant or antiplatelet agents. ASA                            Grade Assessment: II - A patient with mild systemic                            disease. After reviewing the risks and benefits,                            the patient was deemed in satisfactory condition to                            undergo the procedure.  After obtaining informed consent, the colonoscope                            was passed under direct vision. Throughout the                            procedure, the patient's blood pressure, pulse, and                            oxygen saturations were monitored continuously. The                            CF [id number] #[id number] was introduced through the anus                            and advanced to  the the terminal ileum, with                            identification of the appendiceal orifice and IC                            valve. The colonoscopy was performed without                            difficulty. The patient tolerated the procedure                            well. The quality of the bowel preparation was                            good. The terminal ileum, ileocecal valve,                            appendiceal orifice, and rectum were photographed. Scope In: 11:06:50 AM Scope Out: 11:26:41 AM Scope Withdrawal Time: 0 hours 12 minutes 52 seconds  Total Procedure Duration: 0 hours 19 minutes 51 seconds  Findings:                 The perianal and digital rectal examinations were                            normal. Pertinent negatives include normal                            sphincter tone and no palpable rectal lesions.                           A 3 mm polyp was found in the transverse colon. The                            polyp was sessile. The polyp was removed with a                            cold snare. Resection  and retrieval were complete.                            Estimated blood loss was minimal.                           A few small-mouthed diverticula were found in the                            descending colon.                           The exam was otherwise normal throughout the                            examined colon.                           The terminal ileum appeared normal.                           The retroflexed view of the distal rectum and anal                            verge was normal and showed no anal or rectal                            abnormalities. Complications:            No immediate complications. Estimated Blood Loss:     Estimated blood loss was minimal. Impression:               - One 3 mm polyp in the transverse colon, removed                            with a cold snare. Resected and retrieved.                           -  Mild diverticulosis in the descending colon.                           - The examined portion of the ileum was normal.                           - The distal rectum and anal verge are normal on                            retroflexion view. Recommendation:           - Patient has a contact number available for                            emergencies. The signs and symptoms of potential                            delayed complications were discussed with the  patient. Return to normal activities tomorrow.                            Written discharge instructions were provided to the                            patient.                           - Resume previous diet.                           - Continue present medications.                           - Await pathology results.                           - Repeat colonoscopy (date not yet determined) for                            surveillance based on pathology results.  E. Tomasa Rand, MD 07/30/2023 11:34:43 AM This report has been signed electronically.

## 2023-07-30 NOTE — Progress Notes (Signed)
Pt's states no medical or surgical changes since previsit or office visit. 

## 2023-07-30 NOTE — Progress Notes (Signed)
[facility] History and Physical   Primary Care Physician:  [name], FNP   Reason for Procedure:   Colon cancer screening  Plan:    Screening colonoscopy     HPI: Meredith York is a 47 y.o. female undergoing initial average risk screening colonoscopy.  She has no family history of colon cancer and no chronic GI symptoms.    Past Medical History:  Diagnosis Date   Carpal tunnel syndrome on right    Chlamydia    Gestational diabetes mellitus    Hypertension    no medicaqtions at this time   Migraines    Ovarian cyst    S/P lumbar discectomy 04/10/2015   Tendonitis    right wrist   Trichomonas    Type 2 diabetes mellitus (HCC)    not currently taking medication    Past Surgical History:  Procedure Laterality Date   ABDOMINAL HYSTERECTOMY     BACK SURGERY     CESAREAN SECTION  12/27/2011   Procedure: CESAREAN SECTION;  Surgeon: Brock Bad, MD;  Location: [facility];  Service: Gynecology;  Laterality: N/A;  Primary cesarean section with delivery of baby girl at 42. Apgars 3/3/6   CESAREAN SECTION N/A 08/03/2016   Procedure: CESAREAN SECTION;  Surgeon: Tereso Newcomer, MD;  Location: [facility];  Service: Obstetrics;  Laterality: N/A;   CHOLECYSTECTOMY  02/2009   DILATION AND CURETTAGE OF UTERUS N/A 06/25/2014   Procedure: DILATATION AND CURETTAGE;  Surgeon: Reva Bores, MD;  Location: [facility];  Service: Gynecology;  Laterality: N/A;   LUMBAR LAMINECTOMY/DECOMPRESSION MICRODISCECTOMY Left 04/10/2015   Procedure: Left L5-S1 Microdiscectomy;  Surgeon: Eldred Manges, MD;  Location: [facility];  Service: Orthopedics;  Laterality: Left;   Rotator cuff partial repair Left 04/06/2023   VAGINAL HYSTERECTOMY N/A 06/04/2020   Procedure: HYSTERECTOMY VAGINAL, MORCELLATION;  Surgeon: Hermina Staggers, MD;  Location: [facility];  Service: Gynecology;  Laterality: N/A;   WISDOM TOOTH EXTRACTION      Prior to Admission medications   Medication  Sig Start Date End Date Taking? Authorizing Provider  atorvastatin (LIPITOR) 20 MG tablet Take 1 tablet (20 mg total) by mouth daily. 06/25/23 06/24/24 Yes Ellender Hose, NP  olmesartan (BENICAR) 40 MG tablet Take 1 tablet (40 mg total) by mouth daily. 07/27/23  Yes [name], FNP  diazepam (VALIUM) 5 MG tablet Take one tablet by mouth with food one hour prior to procedure. May repeat 30 minutes prior if needed. Patient not taking: Reported on 07/30/2023 07/27/23   Juanda Chance, NP  Semaglutide (RYBELSUS) 7 MG TABS Take 1 tablet (7 mg total) by mouth daily. Take 30 minutes before breakfast Patient not taking: Reported on 07/30/2023 07/27/23   [name], FNP    Current Outpatient Medications  Medication Sig Dispense Refill   atorvastatin (LIPITOR) 20 MG tablet Take 1 tablet (20 mg total) by mouth daily. 30 tablet 5   olmesartan (BENICAR) 40 MG tablet Take 1 tablet (40 mg total) by mouth daily. 90 tablet 1   diazepam (VALIUM) 5 MG tablet Take one tablet by mouth with food one hour prior to procedure. May repeat 30 minutes prior if needed. (Patient not taking: Reported on 07/30/2023) 2 tablet 0   Semaglutide (RYBELSUS) 7 MG TABS Take 1 tablet (7 mg total) by mouth daily. Take 30 minutes before breakfast (Patient not taking: Reported on 07/30/2023) 90 tablet 1   Current Facility-Administered Medications  Medication Dose Route Frequency Provider Last Rate  Last Admin   0.9 %  sodium chloride infusion  500 mL Intravenous Once Jenel Lucks, MD        Allergies as of 07/30/2023 - Review Complete 07/30/2023  Allergen Reaction Noted   Amoxicillin Hives and Itching 07/05/2011   Percocet [oxycodone-acetaminophen] Itching 06/05/2020   Gabapentin Rash 02/11/2021    Family History  Problem Relation Age of Onset   Hypertension Mother    Diabetes Mother    Heart disease Father    Hypertension Maternal Aunt    Hypertension Paternal Aunt    Club foot Daughter    Hirschsprung's disease Daughter     Learning disabilities Daughter    Polydactyly Daughter    Anesthesia problems Neg Hx    Hypotension Neg Hx    Malignant hyperthermia Neg Hx    Pseudochol deficiency Neg Hx    Colon cancer Neg Hx    Colon polyps Neg Hx    Esophageal cancer Neg Hx    Rectal cancer Neg Hx    Stomach cancer Neg Hx     Social History   Socioeconomic History   Marital status: Single    Spouse name: Not on file   Number of children: Not on file   Years of education: Not on file   Highest education level: Not on file  Occupational History   Not on file  Tobacco Use   Smoking status: Former    Current packs/day: 0.25    Average packs/day: 0.3 packs/day for 24.0 years (6.0 ttl pk-yrs)    Types: Cigarettes   Smokeless tobacco: Former   Tobacco comments:    she has cut back - sometimes will not smoke at all.   Vaping Use   Vaping status: Some Days  Substance and Sexual Activity   Alcohol use: Yes    Comment: occ   Drug use: Yes    Types: Marijuana    Comment: occ   Sexual activity: Not on file  Other Topics Concern   Not on file  Social History Narrative   Not on file   Social Determinants of Health   Financial Resource Strain: Not on file  Food Insecurity: Not on file  Transportation Needs: Not on file  Physical Activity: Not on file  Stress: Not on file  Social Connections: Not on file  Intimate Partner Violence: Not on file    Review of Systems:  All other review of systems negative except as mentioned in the HPI.  Physical Exam: Vital signs BP (!) 131/97   Pulse 83   Temp (!) 97.1 F (36.2 C) (Temporal)   Ht 5' 2.5" (1.588 m)   Wt 210 lb (95.3 kg)   LMP  (LMP Unknown)   SpO2 99%   BMI 37.80 kg/m   General:   Alert,  Well-developed, well-nourished, pleasant and cooperative in NAD Airway:  Mallampati 2 Lungs:  Clear throughout to auscultation.   Heart:  Regular rate and rhythm; no murmurs, clicks, rubs,  or gallops. Abdomen:  Soft, nontender and nondistended. Normal  bowel sounds.   Neuro/Psych:  Normal mood and affect. A and O x 3    E. Tomasa Rand, MD [facility]

## 2023-07-30 NOTE — Patient Instructions (Addendum)
Resume previous diet. Continue present medications. Await pathology results. Repeat colonoscopy (date not yet determined) for surveillance based on pathology results.  YOU HAD AN ENDOSCOPIC PROCEDURE TODAY AT THE [facility]:   Refer to the procedure report that was given to you for any specific questions about what was found during the examination.  If the procedure report does not answer your questions, please call your gastroenterologist to clarify.  If you requested that your care partner not be given the details of your procedure findings, then the procedure report has been included in a sealed envelope for you to review at your convenience later.  YOU SHOULD EXPECT: Some feelings of bloating in the abdomen. Passage of more gas than usual.  Walking can help get rid of the air that was put into your GI tract during the procedure and reduce the bloating. If you had a lower endoscopy (such as a colonoscopy or flexible sigmoidoscopy) you may notice spotting of blood in your stool or on the toilet paper. If you underwent a bowel prep for your procedure, you may not have a normal bowel movement for a few days.  Please Note:  You might notice some irritation and congestion in your nose or some drainage.  This is from the oxygen used during your procedure.  There is no need for concern and it should clear up in a day or so.  SYMPTOMS TO REPORT IMMEDIATELY:  Following lower endoscopy (colonoscopy or flexible sigmoidoscopy):  Excessive amounts of blood in the stool  Significant tenderness or worsening of abdominal pains  Swelling of the abdomen that is new, acute  Fever of 100F or higher  For urgent or emergent issues, a gastroenterologist can be reached at any hour by calling [phone]. Do not use MyChart messaging for urgent concerns.    DIET:  We do recommend a small meal at first, but then you may proceed to your regular diet.  Drink plenty of fluids but you should avoid  alcoholic beverages for 24 hours.  ACTIVITY:  You should plan to take it easy for the rest of today and you should NOT DRIVE or use heavy machinery until tomorrow (because of the sedation medicines used during the test).    FOLLOW UP: Our staff will call the number listed on your records the next business day following your procedure.  We will call around 7:15- 8:00 am to check on you and address any questions or concerns that you may have regarding the information given to you following your procedure. If we do not reach you, we will leave a message.     If any biopsies were taken you will be contacted by phone or by letter within the next 1-3 weeks.  Please call us at [phone] if you have not heard about the biopsies in 3 weeks.    SIGNATURES/CONFIDENTIALITY: You and/or your care partner have signed paperwork which will be entered into your electronic medical record.  These signatures attest to the fact that that the information above on your After Visit Summary has been reviewed and is understood.  Full responsibility of the confidentiality of this discharge information lies with you and/or your care-partner. 

## 2023-08-03 ENCOUNTER — Telehealth: Payer: Self-pay

## 2023-08-03 NOTE — Telephone Encounter (Signed)
  Follow up Call-     07/30/2023   10:21 AM  Call back number  Post procedure Call Back phone  # [phone]  Permission to leave phone message Yes    Left message

## 2023-08-10 NOTE — Progress Notes (Signed)
Ms. Fought,  The polyp which I removed during your recent procedure was proven to be completely benign but is considered a "pre-cancerous" polyp that MAY have grown into cancer if it had not been removed.  Studies shows that at least 20% of women over age 46 and 30% of men over age 53 have pre-cancerous polyps.  Based on current nationally recognized surveillance guidelines, I recommend that you have a repeat colonoscopy in 7 years.   If you develop any new rectal bleeding, abdominal pain or significant bowel habit changes, please contact me before then.

## 2023-08-12 ENCOUNTER — Ambulatory Visit (INDEPENDENT_AMBULATORY_CARE_PROVIDER_SITE_OTHER): Payer: 59 | Admitting: Physical Medicine and Rehabilitation

## 2023-08-12 ENCOUNTER — Other Ambulatory Visit: Payer: Self-pay

## 2023-08-12 VITALS — BP 165/112 | HR 84

## 2023-08-12 DIAGNOSIS — M5416 Radiculopathy, lumbar region: Secondary | ICD-10-CM | POA: Diagnosis not present

## 2023-08-12 MED ORDER — METHYLPREDNISOLONE ACETATE 80 MG/ML IJ SUSP
80.0000 mg | Freq: Once | INTRAMUSCULAR | Status: AC
  Administered 2023-08-12: 80 mg

## 2023-08-12 NOTE — Patient Instructions (Signed)

## 2023-08-12 NOTE — Progress Notes (Signed)
Functional Pain Scale - descriptive words and definitions  Distressing (6)    Pain is present/unable to complete most ADLs limited by pain/sleep is difficult and active distraction is only marginal. Moderate range order  Average Pain 8   +Driver, -BT, -Dye Allergies.  Lower back pain on left side that radiates down the left leg

## 2023-08-16 DIAGNOSIS — Z6836 Body mass index (BMI) 36.0-36.9, adult: Secondary | ICD-10-CM | POA: Insufficient documentation

## 2023-08-16 NOTE — Assessment & Plan Note (Signed)
She is encouraged to strive for BMI less than 30 to decrease cardiac risk. Advised to aim for at least 150 minutes of exercise per week.  

## 2023-08-21 NOTE — Procedures (Signed)
Lumbar Epidural Steroid Injection - Interlaminar Approach with Fluoroscopic Guidance  Patient: Meredith York      Date of Birth: 09-03-1976 MRN: [id number] PCP: [name], FNP      Visit Date: 08/12/2023   Universal Protocol:     Consent Given By: the patient  Position: PRONE  Additional Comments: Vital signs were monitored before and after the procedure. Patient was prepped and draped in the usual sterile fashion. The correct patient, procedure, and site was verified.   Injection Procedure Details:   Procedure diagnoses: Lumbar radiculopathy [M54.16]   Meds Administered:  Meds ordered this encounter  Medications   methylPREDNISolone acetate (DEPO-MEDROL) injection 80 mg     Laterality: Left  Location/Site:  L5-S1  Needle: 3.5 in., 20 ga. Tuohy  Needle Placement: Paramedian epidural  Findings:   -Comments: Excellent flow of contrast into the epidural space.  Procedure Details: Using a paramedian approach from the side mentioned above, the region overlying the inferior lamina was localized under fluoroscopic visualization and the soft tissues overlying this structure were infiltrated with 4 ml. of 1% Lidocaine without Epinephrine. The Tuohy needle was inserted into the epidural space using a paramedian approach.   The epidural space was localized using loss of resistance along with counter oblique bi-planar fluoroscopic views.  After negative aspirate for air, blood, and CSF, a 2 ml. volume of Isovue-[id number] was injected into the epidural space and the flow of contrast was observed. Radiographs were obtained for documentation purposes.    The injectate was administered into the level noted above.   Additional Comments:  No complications occurred Dressing: 2 x 2 sterile gauze and Band-Aid    Post-procedure details: Patient was observed during the procedure. Post-procedure instructions were reviewed.  Patient left the clinic in stable condition.

## 2023-08-21 NOTE — Progress Notes (Signed)
Meredith York - 47 y.o. female MRN [id number]  Date of birth: 05/27/1976  Office Visit Note: Visit Date: 08/12/2023 PCP: [name], FNP Referred by: [name], FNP  Subjective: Chief Complaint  Patient presents with   Lower Back - Pain   HPI:  Meredith York is a 47 y.o. female who comes in today at the request of Ellin Goodie, FNP for planned Left L5-S1 Lumbar Interlaminar epidural steroid injection with fluoroscopic guidance.  The patient has failed conservative care including home exercise, medications, time and activity modification.  This injection will be diagnostic and hopefully therapeutic.  Please see requesting physician notes for further details and justification.   ROS Otherwise per HPI.  Assessment & Plan: Visit Diagnoses:    ICD-10-CM   1. Lumbar radiculopathy  M54.16 XR C-ARM NO REPORT    Epidural Steroid injection    methylPREDNISolone acetate (DEPO-MEDROL) injection 80 mg      Plan: No additional findings.   Meds & Orders:  Meds ordered this encounter  Medications   methylPREDNISolone acetate (DEPO-MEDROL) injection 80 mg    Orders Placed This Encounter  Procedures   XR C-ARM NO REPORT   Epidural Steroid injection    Follow-up: Return for visit to requesting provider as needed.   Procedures: No procedures performed  Lumbar Epidural Steroid Injection - Interlaminar Approach with Fluoroscopic Guidance  Patient: Meredith York      Date of Birth: 1976/10/09 MRN: [id number] PCP: [name], FNP      Visit Date: 08/12/2023   Universal Protocol:     Consent Given By: the patient  Position: PRONE  Additional Comments: Vital signs were monitored before and after the procedure. Patient was prepped and draped in the usual sterile fashion. The correct patient, procedure, and site was verified.   Injection Procedure Details:   Procedure diagnoses: Lumbar radiculopathy [M54.16]   Meds Administered:  Meds ordered this encounter   Medications   methylPREDNISolone acetate (DEPO-MEDROL) injection 80 mg     Laterality: Left  Location/Site:  L5-S1  Needle: 3.5 in., 20 ga. Tuohy  Needle Placement: Paramedian epidural  Findings:   -Comments: Excellent flow of contrast into the epidural space.  Procedure Details: Using a paramedian approach from the side mentioned above, the region overlying the inferior lamina was localized under fluoroscopic visualization and the soft tissues overlying this structure were infiltrated with 4 ml. of 1% Lidocaine without Epinephrine. The Tuohy needle was inserted into the epidural space using a paramedian approach.   The epidural space was localized using loss of resistance along with counter oblique bi-planar fluoroscopic views.  After negative aspirate for air, blood, and CSF, a 2 ml. volume of Isovue-250 was injected into the epidural space and the flow of contrast was observed. Radiographs were obtained for documentation purposes.    The injectate was administered into the level noted above.   Additional Comments:  No complications occurred Dressing: 2 x 2 sterile gauze and Band-Aid    Post-procedure details: Patient was observed during the procedure. Post-procedure instructions were reviewed.  Patient left the clinic in stable condition.   Clinical History: CLINICAL DATA:  Radiculopathy, lumbar region. Weakness of left lower extremity. Low back pain, greater than 6 weeks; low back pain, progressive neurologic deficit; low back pain, prior surgery, new symptoms; worsening low back pain with left lower extremity radiculopathy and weakness. Additional history provided by scanning technologist: Patient reports low back pain that radiates down left leg into knee, numbness, difficulty walking for approximately 1 month,  prior lumbar spine surgery in 2016.  COMPARISON:  Radiographs of the lumbar spine 09/11/2020. lumbar spine MRI 01/29/2015.   FINDINGS: Segmentation: For  the purposes of this dictation, five lumbar vertebrae are assumed and the caudal most well-formed intervertebral disc is designated L5-S1.   Alignment: Straightening of the expected lumbar lordosis. No significant spondylolisthesis.   Vertebrae: Vertebral body height is maintained. No focal suspicious osseous lesion or significant marrow edema.   Conus medullaris and cauda equina: Conus extends to the L1-L2 level. No signal abnormality within the visualized distal spinal cord.   Paraspinal and other soft tissues: No abnormality identified within included portions of the abdomen/retroperitoneum. Postsurgical changes to the lower dorsal lumbar soft tissues.   Disc levels:   Unless otherwise stated, the level by level findings below have not significantly changed since prior MRI 01/29/2015   Mild L5-S1 disc degeneration. Disc height and hydration are maintained at the remaining lumbar levels.   There is no significant disc herniation, spinal canal stenosis or neural foraminal narrowing at the T12-L1 through L2-L3 levels.   L3-L4: Mild facet arthrosis on the left. Minimal relative left subarticular narrowing without nerve root impingement. Central canal patent. No significant foraminal stenosis.   L4-L5: New from the prior MRI, there is a left foraminal/extraforaminal disc extrusion (series 5, image 28) (series 8, image 28) (series 2, image 12). Mild facet arthrosis/ligamentum flavum hypertrophy (greater on the left). Minimal relative left subarticular narrowing without nerve root impingement. Central canal patent. Progressive moderate left foraminal stenosis with the disc extrusion contacting the exiting left L4 nerve root within and beyond the left neural foramen (series 2, image 12) (series 5, image 28). No significant foraminal stenosis on the right.   L5-S1: Since the prior MRI, there is been interval left hemilaminotomy and discectomy. Disc bulge with endplate  spurring. Residual/recurrent shallow broad-based central disc protrusion. Facet arthrosis (greater on the left). The residual/recurrent disc protrusion contributes to moderate left and mild right subarticular narrowing with contact upon the left greater than right descending S1 nerve roots (series 4, image 34). Central canal patent. Mild/moderate bilateral neural foraminal narrowing, progressed.   IMPRESSION: Comparison is made to the prior MRI of 08/30/2015. Lumbar spondylosis and postsurgical changes as outlined and with findings most notably as follows.   At L4-L5, there is a new left foraminal/extraforaminal disc extrusion. Mild facet arthrosis/ligamentum flavum hypertrophy. Progressive moderate left foraminal stenosis with the disc extrusion contacting the exiting left L4 nerve root within and beyond the left neural foramen. Minimal relative left subarticular narrowing without nerve root impingement.   At L5-S1, there has been interval left hemilaminotomy and discectomy. Mild disc degeneration. Disc bulge with endplate spurring. Residual/recurrent shallow broad-based central disc protrusion. Facet arthrosis (greater on the left). The residual/recurrent disc protrusion contributes to moderate left and mild right subarticular narrowing with contact upon the left greater than right descending S1 nerve roots. Mild/moderate bilateral neural foraminal narrowing, progressed.     Electronically Signed   By: Jackey Loge DO   On: 10/22/2020 14:45     Objective:  VS:  HT:    WT:   BMI:     BP:(!) 165/112  HR:84bpm  TEMP: ( )  RESP:  Physical Exam Vitals and nursing note reviewed.  Constitutional:      General: She is not in acute distress.    Appearance: Normal appearance. She is not ill-appearing.  HENT:     Head: Normocephalic and atraumatic.     Right Ear: External ear  normal.     Left Ear: External ear normal.  Eyes:     Extraocular Movements: Extraocular movements  intact.  Cardiovascular:     Rate and Rhythm: Normal rate.     Pulses: Normal pulses.  Pulmonary:     Effort: Pulmonary effort is normal. No respiratory distress.  Abdominal:     General: There is no distension.     Palpations: Abdomen is soft.  Musculoskeletal:        General: Tenderness present.     Cervical back: Neck supple.     Right lower leg: No edema.     Left lower leg: No edema.     Comments: Patient has good distal strength with no pain over the greater trochanters.  No clonus or focal weakness.  Skin:    Findings: No erythema, lesion or rash.  Neurological:     General: No focal deficit present.     Mental Status: She is alert and oriented to person, place, and time.     Sensory: No sensory deficit.     Motor: No weakness or abnormal muscle tone.     Coordination: Coordination normal.  Psychiatric:        Mood and Affect: Mood normal.        Behavior: Behavior normal.      Imaging: No results found.

## 2023-08-26 ENCOUNTER — Other Ambulatory Visit: Payer: Self-pay

## 2023-08-26 DIAGNOSIS — E1169 Type 2 diabetes mellitus with other specified complication: Secondary | ICD-10-CM

## 2023-08-26 DIAGNOSIS — I10 Essential (primary) hypertension: Secondary | ICD-10-CM

## 2023-08-26 MED ORDER — RYBELSUS 7 MG PO TABS
1.0000 | ORAL_TABLET | Freq: Every day | ORAL | 1 refills | Status: AC
Start: 2023-08-26 — End: ?

## 2023-08-26 MED ORDER — OLMESARTAN MEDOXOMIL 40 MG PO TABS
40.0000 mg | ORAL_TABLET | Freq: Every day | ORAL | 2 refills | Status: AC
Start: 2023-08-26 — End: ?

## 2023-09-28 ENCOUNTER — Other Ambulatory Visit: Payer: Self-pay | Admitting: Family Medicine

## 2023-09-28 DIAGNOSIS — E1169 Type 2 diabetes mellitus with other specified complication: Secondary | ICD-10-CM

## 2023-10-11 ENCOUNTER — Encounter: Payer: 59 | Admitting: Nurse Practitioner

## 2023-10-11 NOTE — Progress Notes (Deleted)
Madelaine Bhat, CMA,acting as a Neurosurgeon for [name], FNP.,have documented all relevant documentation on the behalf of [name], FNP,as directed by  [name], FNP while in the presence of [name], FNP.  Subjective:    Patient ID: Meredith York , female    DOB: Sep 12, 1976 , 47 y.o.   MRN: [id number]  No chief complaint on file.   HPI  Patient presents today for HM, Patient reports compliance with medication. Patient denies any chest pain, SOB, or headaches. Patient has no concerns today.     Past Medical History:  Diagnosis Date  . Carpal tunnel syndrome on right   . Chlamydia   . Gestational diabetes mellitus   . Hypertension    no medicaqtions at this time  . Migraines   . Ovarian cyst   . S/P lumbar discectomy 04/10/2015  . Tendonitis    right wrist  . Trichomonas   . Type 2 diabetes mellitus (HCC)    not currently taking medication     Family History  Problem Relation Age of Onset  . Hypertension Mother   . Diabetes Mother   . Heart disease Father   . Hypertension Maternal Aunt   . Hypertension Paternal Aunt   . Club foot Daughter   . Hirschsprung's disease Daughter   . Learning disabilities Daughter   . Polydactyly Daughter   . Anesthesia problems Neg Hx   . Hypotension Neg Hx   . Malignant hyperthermia Neg Hx   . Pseudochol deficiency Neg Hx   . Colon cancer Neg Hx   . Colon polyps Neg Hx   . Esophageal cancer Neg Hx   . Rectal cancer Neg Hx   . Stomach cancer Neg Hx      Current Outpatient Medications:  .  atorvastatin (LIPITOR) 20 MG tablet, TAKE 1 TABLET(20 MG) BY MOUTH DAILY, Disp: 30 tablet, Rfl: 5 .  olmesartan (BENICAR) 40 MG tablet, Take 1 tablet (40 mg total) by mouth daily., Disp: 90 tablet, Rfl: 2 .  Semaglutide (RYBELSUS) 7 MG TABS, Take 1 tablet (7 mg total) by mouth daily. Take 30 minutes before breakfast, Disp: 90 tablet, Rfl: 1   Allergies  Allergen Reactions  . Amoxicillin Hives and Itching    Has patient had a PCN  reaction causing immediate rash, facial/tongue/throat swelling, SOB or lightheadedness with hypotension: No Has patient had a PCN reaction causing severe rash involving mucus membranes or skin necrosis: No Has patient had a PCN reaction that required hospitalization No Has patient had a PCN reaction occurring within the last 10 years: No If all of the above answers are "NO", then may proceed with Cephalosporin use.   Marland Kitchen Percocet [Oxycodone-Acetaminophen] Itching  . Gabapentin Rash    Made face break out      The patient states she uses {contraceptive methods:5051} for birth control. No LMP recorded (lmp unknown). Patient has had a hysterectomy.. {Dysmenorrhea-menorrhagia:21918}. Negative for: breast discharge, breast lump(s), breast pain and breast self exam. Associated symptoms include abnormal vaginal bleeding. Pertinent negatives include abnormal bleeding (hematology), anxiety, decreased libido, depression, difficulty falling sleep, dyspareunia, history of infertility, nocturia, sexual dysfunction, sleep disturbances, urinary incontinence, urinary urgency, vaginal discharge and vaginal itching. Diet regular.The patient states her exercise level is    . The patient's tobacco use is:  Social History   Tobacco Use  Smoking Status Former  . Current packs/day: 0.25  . Average packs/day: 0.3 packs/day for 24.0 years (6.0 ttl pk-yrs)  . Types: Cigarettes  Smokeless  Tobacco Former  Tobacco Comments   she has cut back - sometimes will not smoke at all.   . She has been exposed to passive smoke. The patient's alcohol use is:  Social History   Substance and Sexual Activity  Alcohol Use Yes   Comment: occ  . Additional information: Last pap ***, next one scheduled for ***.    Review of Systems  Constitutional: Negative.   HENT: Negative.    Eyes: Negative.   Respiratory: Negative.    Cardiovascular: Negative.   Gastrointestinal: Negative.   Endocrine: Negative.   Genitourinary:  Negative.   Musculoskeletal: Negative.   Skin: Negative.   Allergic/Immunologic: Negative.   Neurological: Negative.   Hematological: Negative.   Psychiatric/Behavioral: Negative.      There were no vitals filed for this visit. There is no height or weight on file to calculate BMI.  Wt Readings from Last 3 Encounters:  07/30/23 210 lb (95.3 kg)  07/27/23 208 lb 12.8 oz (94.7 kg)  07/16/23 210 lb (95.3 kg)     Objective:  Physical Exam      Assessment And Plan:     Encounter for annual health examination  Primary hypertension  Type 2 diabetes mellitus with obesity (HCC)     No follow-ups on file. Patient was given opportunity to ask questions. Patient verbalized understanding of the plan and was able to repeat key elements of the plan. All questions were answered to their satisfaction.   [name], FNP  I, [name], FNP, have reviewed all documentation for this visit. The documentation on 10/11/23 for the exam, diagnosis, procedures, and orders are all accurate and complete.

## 2024-01-07 ENCOUNTER — Other Ambulatory Visit (HOSPITAL_BASED_OUTPATIENT_CLINIC_OR_DEPARTMENT_OTHER): Payer: Self-pay

## 2024-01-07 ENCOUNTER — Encounter (HOSPITAL_BASED_OUTPATIENT_CLINIC_OR_DEPARTMENT_OTHER): Payer: Self-pay | Admitting: Emergency Medicine

## 2024-01-07 ENCOUNTER — Emergency Department (HOSPITAL_BASED_OUTPATIENT_CLINIC_OR_DEPARTMENT_OTHER)
Admission: EM | Admit: 2024-01-07 | Discharge: 2024-01-07 | Disposition: A | Discharge status: Home / Self Care | Payer: Medicaid Other | Attending: Emergency Medicine | Admitting: Emergency Medicine

## 2024-01-07 ENCOUNTER — Other Ambulatory Visit: Payer: Self-pay

## 2024-01-07 DIAGNOSIS — M25511 Pain in right shoulder: Secondary | ICD-10-CM | POA: Diagnosis present

## 2024-01-07 DIAGNOSIS — G8929 Other chronic pain: Secondary | ICD-10-CM | POA: Insufficient documentation

## 2024-01-07 MED ORDER — LIDOCAINE 5 % EX PTCH
1.0000 | MEDICATED_PATCH | CUTANEOUS | Status: DC
  Administered 2024-01-07: 1 via TRANSDERMAL
  Filled 2024-01-07: qty 1

## 2024-01-07 MED ORDER — LIDOCAINE 5 % EX PTCH
1.0000 | MEDICATED_PATCH | CUTANEOUS | 0 refills | Status: AC
  Filled 2024-01-07 (×2): qty 30, 30d supply, fill #0

## 2024-01-07 NOTE — Discharge Instructions (Addendum)
You were seen in the emerged from for shoulder pain Your pain is related to your chronic shoulder arthritis Is important that you follow-up with your orthopedic team to discuss PT and potential operative management We have called in lidocaine patches to pick up from your pharmacy since this seemed to help. You can continue taking the pain medications you have at home Return to the emerged part for severe pain or any other concerns

## 2024-01-07 NOTE — ED Provider Notes (Signed)
[facility] Provider Note   CSN: [id number] Arrival date & time: 01/07/24  0745     History  Chief Complaint  Patient presents with   Shoulder Pain    Meredith York is a 48 y.o. female.  With a history of osteoarthritis status post left shoulder revision who presents the ED for right shoulder pain.  Patient has had ongoing issues related to her right shoulder.  X-ray taken 3 weeks ago showed significant arthritic changes.  She received steroid injection from her orthopedic specialist 3 weeks ago which helped for about 2 weeks but since then has had significant pain in the right shoulder.  No new injuries or trauma to the area.  Tylenol and Celebrex have provided minimal relief.  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  No other complaints at this time   Shoulder Pain      Home Medications Prior to Admission medications   Medication Sig Start Date End Date Taking? Authorizing Provider  lidocaine (LIDODERM) 5 % Place 1 patch onto the skin daily. Remove & Discard patch within 12 hours or as directed by MD 01/07/24  Yes Royanne Foots, DO  atorvastatin (LIPITOR) 20 MG tablet TAKE 1 TABLET(20 MG) BY MOUTH DAILY 09/28/23   Ellender Hose, NP  olmesartan (BENICAR) 40 MG tablet Take 1 tablet (40 mg total) by mouth daily. 08/26/23   [name], FNP  Semaglutide (RYBELSUS) 7 MG TABS Take 1 tablet (7 mg total) by mouth daily. Take 30 minutes before breakfast 08/26/23   [name], FNP      Allergies    Amoxicillin, Oxycodone, and Gabapentin    Review of Systems   Review of Systems  Physical Exam Updated Vital Signs BP 117/72 (BP Location: Left Arm)   Pulse 65   Temp 98.5 F (36.9 C)   Resp 18   Ht 5\' 2"  (1.575 m)   Wt 91.6 kg   LMP  (LMP Unknown)   SpO2 100%   BMI 36.95 kg/m  Physical Exam Vitals and nursing note reviewed.  HENT:     Head: Normocephalic and  atraumatic.  Eyes:     Pupils: Pupils are equal, round, and reactive to light.  Cardiovascular:     Rate and Rhythm: Normal rate and regular rhythm.  Pulmonary:     Effort: Pulmonary effort is normal.     Breath sounds: Normal breath sounds.  Abdominal:     Palpations: Abdomen is soft.     Tenderness: There is no abdominal tenderness.  Musculoskeletal:     Comments: Limited range of motion with flexion and abduction of the right shoulder joint Some mild tenderness over anterior aspect of right shoulder No overlying erythema or warmth 2+ radial pulses bilaterally Good strength and range of motion throughout right hand and right elbow Full active range of motion left shoulder without tenderness  Skin:    General: Skin is warm and dry.  Neurological:     Mental Status: She is alert.  Psychiatric:        Mood and Affect: Mood normal.     ED Results / Procedures / Treatments   Labs (all labs ordered are listed, but only abnormal results are displayed) Labs Reviewed - No data to display  EKG None  Radiology No results found.  Procedures Procedures    Medications Ordered in ED Medications  lidocaine (LIDODERM) 5 % 1 patch (  1 patch Transdermal Patch Applied 01/07/24 0903)    ED Course/ Medical Decision Making/ A&P Clinical Course as of 01/07/24 0943  Fri Jan 07, 2024  0942 Patient reports some mild relief from the lidocaine patch.  Will prescribe her some lidocaine patches to pick up from her pharmacy.  Otherwise she will need to follow-up with her shoulder specialist for further management of chronic right shoulder arthritic changes [MP]    Clinical Course User Index [MP] Royanne Foots, DO                                 Medical Decision Making 48 year old female with chronic right shoulder pain presenting for acute right shoulder pain.  Worsened after steroid injection wore off about a week ago.  No new injuries.  Is followed by an orthopedic specialist.  Will  likely require right shoulder revision as she did in the left.  X-ray was taken 3 weeks ago.  Doubt any interval change since her is no new injury and symptoms have been chronic.  No role for x-ray or other imaging at this time.  She does have an allergy to Percocet.  Has been taking Tylenol and Celebrex.  Will trial lidocaine patch for here.  Do not think any sort of muscle relaxant would provide much relief given the nature of her discomfort related to osteoarthritic changes in the right shoulder  Risk Prescription drug management.           Final Clinical Impression(s) / ED Diagnoses Final diagnoses:  Chronic right shoulder pain    Rx / DC Orders ED Discharge Orders          Ordered    lidocaine (LIDODERM) 5 %  Every 24 hours        01/07/24 0942              Royanne Foots, DO 01/07/24 [phone]

## 2024-01-07 NOTE — ED Triage Notes (Signed)
Right shoulder pain for years, worsened for last 2 weeks.  Pt seen for same several weeks ago and received a cortisone shot which did help for 2 weeks.  History of rotator cuff surgery on left shoulder.  Feels the same.

## 2024-01-24 NOTE — Therapy (Addendum)
 OUTPATIENT PHYSICAL THERAPY SHOULDER EVALUATION   Patient Name: Meredith York MRN: [id number] DOB:Aug 18, 1976, 48 y.o., female Today's Date: 01/26/2024   END OF SESSION:  PT End of Session - 01/25/24 1556     Visit Number 1    Number of Visits 17    Date for PT Re-Evaluation 03/21/24    Authorization Type [employer]    Authorization - Number of Visits 27    PT Start Time 1615    PT Stop Time 1655    PT Time Calculation (min) 40 min    Activity Tolerance Patient tolerated treatment well    Behavior During Therapy [facility] for tasks assessed/performed             Past Medical History:  Diagnosis Date   Carpal tunnel syndrome on right    Chlamydia    Gestational diabetes mellitus    Hypertension    no medicaqtions at this time   Migraines    Ovarian cyst    S/P lumbar discectomy 04/10/2015   Tendonitis    right wrist   Trichomonas    Type 2 diabetes mellitus (HCC)    not currently taking medication   Past Surgical History:  Procedure Laterality Date   ABDOMINAL HYSTERECTOMY     BACK SURGERY     CESAREAN SECTION  12/27/2011   Procedure: CESAREAN SECTION;  Surgeon: Carlin DELENA Centers, MD;  Location: [facility];  Service: Gynecology;  Laterality: N/A;  Primary cesarean section with delivery of baby girl at 58. Apgars 3/3/6   CESAREAN SECTION N/A 08/03/2016   Procedure: CESAREAN SECTION;  Surgeon: Gloris DELENA Hugger, MD;  Location: [facility];  Service: Obstetrics;  Laterality: N/A;   CHOLECYSTECTOMY  02/2009   COLONOSCOPY  07/30/2023   Glendia Holt at [facility]   DILATION AND CURETTAGE OF UTERUS N/A 06/25/2014   Procedure: DILATATION AND CURETTAGE;  Surgeon: Glenys GORMAN Birk, MD;  Location: [facility];  Service: Gynecology;  Laterality: N/A;   LUMBAR LAMINECTOMY/DECOMPRESSION MICRODISCECTOMY Left 04/10/2015   Procedure: Left L5-S1 Microdiscectomy;  Surgeon: Oneil JAYSON Herald, MD;  Location: [facility] OR;  Service: Orthopedics;  Laterality: Left;   Rotator cuff partial repair Left  04/06/2023   VAGINAL HYSTERECTOMY N/A 06/04/2020   Procedure: HYSTERECTOMY VAGINAL, MORCELLATION;  Surgeon: Lorence Ozell CROME, MD;  Location: [facility];  Service: Gynecology;  Laterality: N/A;   WISDOM TOOTH EXTRACTION     Patient Active Problem List   Diagnosis Date Noted   Chronic right shoulder pain 01/25/2024   Body mass index 36.0-36.9, adult 08/16/2023   Exposure to sexually transmitted disease (STD) 07/08/2023   Flank pain 06/10/2023   Rotator cuff tear, left 01/18/2023   OA (osteoarthritis) of shoulder 12/03/2022   Peroneal tendon tear, left, subsequent encounter 01/17/2021   Recurrent herniation of lumbar disc 10/30/2020   Type 2 diabetes mellitus with obesity (HCC) 09/24/2020   Visit for routine gyn exam 11/29/2019   Encounter for screening mammogram for breast cancer 11/29/2019   History of bilateral tubal ligation 11/29/2019   S/P cesarean section 07/14/2016   Hypertension 07/05/2011    PCP: Georgina Speaks, FNP  REFERRING PROVIDER: Dozier Soulier, MD  REFERRING DIAG: M25.511 (ICD-10-CM) - Right shoulder pain   THERAPY DIAG:  Chronic right shoulder pain  Muscle weakness (generalized)  Rationale for Evaluation and Treatment: Rehabilitation  ONSET DATE: Chronic, ~October of 2024  SUBJECTIVE:  SUBJECTIVE STATEMENT: Patient comes in with c/c of right shoulder pain that is constant. It wakes her up at night and is affecting her ability to work. Overhead motions are the most painful to her and repetitive motions hurt a lot. Tender to the touch in all areas including bicipital groove. Recent cortisone shot worked for two weeks but then stopped. Patient is still using the lidocaine  patches that don't help much aside for providing slight pain relief. Patient reports she is only  sleeping about 5 hours per night because of shoulder pain. Numbness and tingling to the elbow almost always.   Hand dominance: Right  PERTINENT HISTORY: Partial RTC repair on left shoulder 2024  PAIN:  Are you having pain? Yes: NPRS scale: 10/10 at worst, 8/10 constant Pain location: Right SH in all areas Pain description: Sharp, burning Aggravating factors: movement overhead, to the side, repetitive motions  Relieving factors: ice  PRECAUTIONS: None  RED FLAGS: None   WEIGHT BEARING RESTRICTIONS: No  FALLS:  Has patient fallen in last 6 months? No  LIVING ENVIRONMENT: Lives with: lives with their family Lives in: House/apartment No difficulty with ambulation at home  OCCUPATION: Custodian   PLOF: Independent  PATIENT GOALS: Return to ADLs and work without pain, sleep through the night.  NEXT MD VISIT: End of February   OBJECTIVE:  Note: Objective measures were completed at Evaluation unless otherwise noted. PATIENT SURVEYS:  Junie Palin 37/50  COGNITION: Overall cognitive status: Within functional limits for tasks assessed    POSTURE: Forward posture, rounded shoulders.   UPPER EXTREMITY ROM:   Active ROM Right eval Left eval  Shoulder flexion 75 145  Shoulder extension    Shoulder abduction 100 165  Shoulder adduction    Shoulder internal rotation L4   Shoulder external rotation 30 65  Elbow flexion    Elbow extension    Wrist flexion    Wrist extension    Wrist ulnar deviation    Wrist radial deviation    Wrist pronation    Wrist supination    (Blank rows = not tested)  UPPER EXTREMITY MMT:  MMT Right eval Left eval  Shoulder flexion 3 5  Shoulder extension    Shoulder abduction 3 4  Shoulder adduction    Shoulder internal rotation 3 5  Shoulder external rotation 3 5  Middle trapezius 3   Lower trapezius 3   Elbow flexion 4-   Elbow extension    Wrist flexion    Wrist extension    Wrist ulnar deviation    Wrist radial  deviation    Wrist pronation    Wrist supination    Grip strength (lbs) 0 lbs 60 lbs  (Blank rows = not tested)  SHOULDER SPECIAL TESTS: Perform ULTT when patient can tolerate ROM   JOINT MOBILITY TESTING:  Anterior, inferior, and posterior glides all painful with no relief   PALPATION:  Tenderness to anterior, posterior, and lateral shoulder on immediate palpation                                                                                 TREATMENT DATE:  01/27/24 Supine dowel chest press  Supine dowel ER  towards right Seated scapular retraction  Seated upper trap stretch   PATIENT EDUCATION: Education details: Exam findings, POC, HEP.  Person educated: Patient Education method: Explanation, Demonstration, Tactile cues, and Handouts Education comprehension: verbalized understanding and returned demonstration  HOME EXERCISE PROGRAM: Access Code: [email or web address]   ASSESSMENT:  CLINICAL IMPRESSION: Patient is a 48 y.o. female who was seen today for physical therapy evaluation and treatment for c/c of chronic right shoulder pain. Patient is limited in ROM on the right side, PROM > AROM but limited due to muscle guarding and pain. ROM limitations most prominent in flexion, abduction, and ER. Patient has strength deficits on right side, with all right UE scoring a 3/5 and unable to resist against gravity.  Grip strength on the right UE is 0 lbs. Patient exhibits high pain levels that increase with all motions. Tenderness to palpation of anterior, posterior, and superior shoulder region, deltoid tendon, and all trap regions. Patient scored a 37/50 on the QuickDASH indicating high impact on function and ability to perform ADLs. Overall, patient would benefit from skilled PT in order to increase ROM and strength in order to decrease pain levels.   OBJECTIVE IMPAIRMENTS: decreased ROM, decreased strength, postural dysfunction, and pain.   ACTIVITY LIMITATIONS: carrying, lifting, sleeping,  dressing, and reach over head  PARTICIPATION LIMITATIONS: community activity and occupation  PERSONAL FACTORS: Time since onset of injury/illness/exacerbation are also affecting patient's functional outcome.   REHAB POTENTIAL: Good  CLINICAL DECISION MAKING: Stable/uncomplicated  EVALUATION COMPLEXITY: Low   GOALS: Goals reviewed with patient? Yes  SHORT TERM GOALS: Target date: 02/22/2024  Patient will be I with initial HEP in order to progress with therapy. Baseline: Goal status: INITIAL  2.  Patient will score </= 7/10 at worse on the NPS by 3rd visit in order to show an improvement in pain allowing for an increase in functional ability.  Baseline: 10/10 Goal status: INITIAL   LONG TERM GOALS: Target date: 03/21/2024  Patient will be independent with final HEP in order to continue treatment at home for pain tolerance.  Baseline:  Goal status: INITIAL  2.  Patient will score </= 5/10 at worse on the NPS to show an improvement in pain allowing for an increase in functional ability.  Baseline: 10/10 Goal status: INITIAL  3.  Patient will increase ROM by at least 30 degrees in shoulder flexion, abduction, and ER to increase functional ability.  Baseline: see above Goal status: INITIAL  4.  Patient will increase strength to a 4/5 to show an increase in functional activity  Baseline: see above Goal status: INITIAL  5.  Patient will score at least a 27/50 on the QuickDASH to show an improvement in functional levels to return to ADLs.  Baseline: 37/50 Goal status: INITIAL  PLAN:  PT FREQUENCY: 2x/week  PT DURATION: 8 weeks  PLANNED INTERVENTIONS: 97164- PT Re-evaluation, 97110-Therapeutic exercises, 97530- Therapeutic activity, 97112- Neuromuscular re-education, 97535- Self Care, [id number]- Manual therapy, Patient/Family education, Dry Needling, Joint mobilization, Joint manipulation, Spinal manipulation, Spinal mobilization, Cryotherapy, and Moist heat  PLAN FOR NEXT  SESSION: Review/Revise HEP. Continue working on pain management. Increase ROM specifically flexion, abduction, and ER. Periscapular strengthening.    Kent Hummer, Student-PT 01/26/2024, 1:53 PM

## 2024-01-25 ENCOUNTER — Other Ambulatory Visit: Payer: Self-pay

## 2024-01-25 ENCOUNTER — Ambulatory Visit: Payer: Medicaid Other | Attending: Nurse Practitioner | Admitting: Physical Therapy

## 2024-01-25 DIAGNOSIS — M25511 Pain in right shoulder: Secondary | ICD-10-CM | POA: Insufficient documentation

## 2024-01-25 DIAGNOSIS — G8929 Other chronic pain: Secondary | ICD-10-CM | POA: Diagnosis present

## 2024-01-25 DIAGNOSIS — M6281 Muscle weakness (generalized): Secondary | ICD-10-CM | POA: Diagnosis present

## 2024-01-25 NOTE — Patient Instructions (Signed)
 Access Code: [email or web address] URL: [email or web address] Date: 01/25/2024 Prepared by: Elaine Daring  Exercises - Supine Shoulder Press with Dowel  - 2 x daily - 15 reps - Supine Shoulder External Rotation AAROM with Dowel  - 2 x daily - 15 reps - Gentle Upper Trap Stretch  - 2 x daily - 5 reps - 10 seconds hold - Seated Scapular Retraction  - 2 x daily - 2 sets - 10 reps - 3 seconds hold

## 2024-01-26 ENCOUNTER — Encounter: Payer: Self-pay | Admitting: Physical Therapy

## 2024-02-08 ENCOUNTER — Other Ambulatory Visit: Payer: Self-pay

## 2024-02-08 ENCOUNTER — Ambulatory Visit: Payer: Medicaid Other | Admitting: Physical Therapy

## 2024-02-08 DIAGNOSIS — G8929 Other chronic pain: Secondary | ICD-10-CM

## 2024-02-08 DIAGNOSIS — M25511 Pain in right shoulder: Secondary | ICD-10-CM | POA: Diagnosis not present

## 2024-02-08 DIAGNOSIS — M6281 Muscle weakness (generalized): Secondary | ICD-10-CM

## 2024-02-08 NOTE — Therapy (Cosign Needed)
OUTPATIENT PHYSICAL THERAPY TREATMENT   Patient Name: Meredith York MRN: [id number] DOB:13-Jun-1976, 48 y.o., female Today's Date: 02/08/2024   END OF SESSION:  PT End of Session - 02/08/24 1608     Visit Number 2    Number of Visits 17    Date for PT Re-Evaluation 03/21/24    Authorization Type [employer]    Authorization - Number of Visits 27    Progress Note Due on Visit --    PT Start Time 0330    PT Stop Time 0415    PT Time Calculation (min) 45 min    Activity Tolerance Patient tolerated treatment well    Behavior During Therapy [facility] for tasks assessed/performed            Past Medical History:  Diagnosis Date   Carpal tunnel syndrome on right    Chlamydia    Gestational diabetes mellitus    Hypertension    no medicaqtions at this time   Migraines    Ovarian cyst    S/P lumbar discectomy 04/10/2015   Tendonitis    right wrist   Trichomonas    Type 2 diabetes mellitus (HCC)    not currently taking medication   Past Surgical History:  Procedure Laterality Date   ABDOMINAL HYSTERECTOMY     BACK SURGERY     CESAREAN SECTION  12/27/2011   Procedure: CESAREAN SECTION;  Surgeon: Brock Bad, MD;  Location: [facility];  Service: Gynecology;  Laterality: N/A;  Primary cesarean section with delivery of baby girl at 37. Apgars 3/3/6   CESAREAN SECTION N/A 08/03/2016   Procedure: CESAREAN SECTION;  Surgeon: Tereso Newcomer, MD;  Location: [facility];  Service: Obstetrics;  Laterality: N/A;   CHOLECYSTECTOMY  02/2009   COLONOSCOPY  07/30/2023   Tiajuana Amass at [facility]   DILATION AND CURETTAGE OF UTERUS N/A 06/25/2014   Procedure: DILATATION AND CURETTAGE;  Surgeon: Reva Bores, MD;  Location: [facility];  Service: Gynecology;  Laterality: N/A;   LUMBAR LAMINECTOMY/DECOMPRESSION MICRODISCECTOMY Left 04/10/2015   Procedure: Left L5-S1 Microdiscectomy;  Surgeon: Eldred Manges, MD;  Location: [facility] OR;  Service: Orthopedics;  Laterality: Left;   Rotator cuff  partial repair Left 04/06/2023   VAGINAL HYSTERECTOMY N/A 06/04/2020   Procedure: HYSTERECTOMY VAGINAL, MORCELLATION;  Surgeon: Hermina Staggers, MD;  Location: [facility];  Service: Gynecology;  Laterality: N/A;   WISDOM TOOTH EXTRACTION     Patient Active Problem List   Diagnosis Date Noted   Chronic right shoulder pain 01/25/2024   Body mass index 36.0-36.9, adult 08/16/2023   Exposure to sexually transmitted disease (STD) 07/08/2023   Flank pain 06/10/2023   Rotator cuff tear, left 01/18/2023   OA (osteoarthritis) of shoulder 12/03/2022   Peroneal tendon tear, left, subsequent encounter 01/17/2021   Recurrent herniation of lumbar disc 10/30/2020   Type 2 diabetes mellitus with obesity (HCC) 09/24/2020   Visit for routine gyn exam 11/29/2019   Encounter for screening mammogram for breast cancer 11/29/2019   History of bilateral tubal ligation 11/29/2019   S/P cesarean section 07/14/2016   Hypertension 07/05/2011   PCP: [name], FNP  REFERRING PROVIDER: Jones Broom, MD  REFERRING DIAG: M25.511 (ICD-10-CM) - Right shoulder pain   THERAPY DIAG:  Chronic right shoulder pain  Muscle weakness (generalized)  Rationale for Evaluation and Treatment: Rehabilitation  ONSET DATE: Chronic, ~October of 2024   SUBJECTIVE:  SUBJECTIVE STATEMENT: Patient comes in feeling worse than last session. She did not come in last week and is experiencing more tightness and pain in the right shoulder. Rates her pain a 10/10 at worst and a 7/10 at best.    Eval: Patient comes in with c/c of right shoulder pain that is constant. It wakes her up at night and is affecting her ability to work. Overhead motions are the most painful to her and repetitive motions hurt a lot. Tender to the touch in all  areas including bicipital groove. Recent cortisone shot worked for two weeks but then stopped. Patient is still using the lidocaine patches that don't help much aside for providing slight pain relief. Patient reports she is only sleeping about 5 hours per night because of shoulder pain. Numbness and tingling to the elbow almost always.   Hand dominance: Right  PERTINENT HISTORY: Partial RTC repair on left shoulder 2024  PAIN:  Are you having pain? Yes:  NPRS scale: 10/10 at worst, 8/10 constant Pain location: Right shoulder in all areas Pain description: Sharp, burning Aggravating factors: movement overhead, to the side, repetitive motions  Relieving factors: ice  PRECAUTIONS: None  RED FLAGS: None   WEIGHT BEARING RESTRICTIONS: No  FALLS:  Has patient fallen in last 6 months? No  LIVING ENVIRONMENT: Lives with: lives with their family Lives in: House/apartment No difficulty with ambulation at home  OCCUPATION: Custodian   PLOF: Independent  PATIENT GOALS: Return to ADLs and work without pain, sleep through the night.  NEXT MD VISIT: March 5th   OBJECTIVE:  Note: Objective measures were completed at Evaluation unless otherwise noted. PATIENT SURVEYS:  Neldon Mc 37/50   POSTURE: Forward posture, rounded shoulders.   UPPER EXTREMITY ROM:   Active ROM Right eval Left eval  Shoulder flexion 75 145  Shoulder extension    Shoulder abduction 100 165  Shoulder adduction    Shoulder internal rotation L4   Shoulder external rotation 30 65  Elbow flexion    Elbow extension    Wrist flexion    Wrist extension    Wrist ulnar deviation    Wrist radial deviation    Wrist pronation    Wrist supination    (Blank rows = not tested)  UPPER EXTREMITY MMT:  MMT Right eval Left eval  Shoulder flexion 3 5  Shoulder extension    Shoulder abduction 3 4  Shoulder adduction    Shoulder internal rotation 3 5  Shoulder external rotation 3 5  Middle trapezius 3    Lower trapezius 3   Elbow flexion 4-   Elbow extension    Wrist flexion    Wrist extension    Wrist ulnar deviation    Wrist radial deviation    Wrist pronation    Wrist supination    Grip strength (lbs) 0 lbs 60 lbs  (Blank rows = not tested)  SHOULDER SPECIAL TESTS: Perform ULTT when patient can tolerate ROM   JOINT MOBILITY TESTING:  Anterior, inferior, and posterior glides all painful with no relief   PALPATION:  Tenderness to anterior, posterior, and lateral shoulder on immediate palpation  TREATMENT DATE:  02/08/2024 Supine dowel chest press x10 Supine dowel shoulder flexion x10 Supine dowel IR and ER x10  Hand putty gripping x2 min Median nerve glides x10  Pulleys flexion and abduction x1 min  Shoulder ER isometrics 1x15x3"  Seated towel slides flexion x15  Seated towel slides scaption x15  Seated towel slides abduction x15  Wall walks finger ladder x5 up and downs  PATIENT EDUCATION: Education details: HEP Person educated: Patient Education method: Programmer, multimedia, Facilities manager, Actor cues Education comprehension: verbalized understanding and returned demonstration  HOME EXERCISE PROGRAM: Access Code: [email or web address]    ASSESSMENT: CLINICAL IMPRESSION: Patient arrived at PT with a pain increase from last session after taking a week off from PT due to scheduling conflicts. PT responded well to therapy today with no adverse effects. She is still experiencing a lot of right shoulder stiffness and discomfort. Is very pain dominant and limited in ROM both actively and passively on the right side. Continues to be limited in flexion, abduction, and ER mainly. Added grip strength today along with progressing right shoulder ROM. Continues to exhibit high pain levels during treatment but states she feels better than when she comes in slightly. Overall, pain management can be improved through  increasing ROM and strength and PT can continue to be beneficial.   EVAL: Patient is a 48 y.o. female who was seen today for physical therapy evaluation and treatment for c/c of chronic right shoulder pain. Patient is limited in ROM on the right side, PROM > AROM but limited due to muscle guarding and pain. ROM limitations most prominent in flexion, abduction, and ER. Patient has strength deficits on right side, with all right UE scoring a 3/5 and unable to resist against gravity.  Grip strength on the right UE is 0 lbs. Patient exhibits high pain levels that increase with all motions. Tenderness to palpation of anterior, posterior, and superior shoulder region, deltoid tendon, and all trap regions. Patient scored a 37/50 on the QuickDASH indicating high impact on function and ability to perform ADLs. Overall, patient would benefit from skilled PT in order to increase ROM and strength in order to decrease pain levels.   OBJECTIVE IMPAIRMENTS: decreased ROM, decreased strength, postural dysfunction, and pain.   ACTIVITY LIMITATIONS: carrying, lifting, sleeping, dressing, and reach over head  PARTICIPATION LIMITATIONS: community activity and occupation  PERSONAL FACTORS: Time since onset of injury/illness/exacerbation are also affecting patient's functional outcome.    GOALS: Goals reviewed with patient? Yes  SHORT TERM GOALS: Target date: 02/22/2024  Patient will be I with initial HEP in order to progress with therapy. Baseline: Goal status: INITIAL  2.  Patient will score </= 7/10 at worse on the NPS by 3rd visit in order to show an improvement in pain allowing for an increase in functional ability.  Baseline: 10/10 Goal status: INITIAL  LONG TERM GOALS: Target date: 03/21/2024  Patient will be independent with final HEP in order to continue treatment at home for pain tolerance.  Baseline:  Goal status: INITIAL  2.  Patient will score </= 5/10 at worse on the NPS to show an improvement  in pain allowing for an increase in functional ability.  Baseline: 10/10 Goal status: INITIAL  3.  Patient will increase ROM by at least 30 degrees in shoulder flexion, abduction, and ER to increase functional ability.  Baseline: see above Goal status: INITIAL  4.  Patient will increase strength to a 4/5 to show an increase in functional activity  Baseline: see above  Goal status: INITIAL  5.  Patient will score at least a 27/50 on the QuickDASH to show an improvement in functional levels to return to ADLs.  Baseline: 37/50 Goal status: INITIAL   PLAN: PT FREQUENCY: 2x/week  PT DURATION: 8 weeks  PLANNED INTERVENTIONS: 97164- PT Re-evaluation, 97110-Therapeutic exercises, 97530- Therapeutic activity, 97112- Neuromuscular re-education, 97535- Self Care, [id number]- Manual therapy, Patient/Family education, Dry Needling, Joint mobilization, Joint manipulation, Spinal manipulation, Spinal mobilization, Cryotherapy, and Moist heat  PLAN FOR NEXT SESSION: Review/Revise HEP. Continue working on pain management. Increase ROM specifically flexion, abduction, and ER. Periscapular strengthening.    Erin Hearing, Student-PT 02/08/2024, 4:27 PM

## 2024-02-09 ENCOUNTER — Encounter: Payer: Self-pay | Admitting: Physical Therapy

## 2024-02-10 ENCOUNTER — Encounter: Payer: Self-pay | Admitting: Physical Therapy

## 2024-02-10 ENCOUNTER — Other Ambulatory Visit: Payer: Self-pay

## 2024-02-10 ENCOUNTER — Ambulatory Visit: Payer: Medicaid Other | Admitting: Physical Therapy

## 2024-02-10 DIAGNOSIS — G8929 Other chronic pain: Secondary | ICD-10-CM

## 2024-02-10 DIAGNOSIS — M6281 Muscle weakness (generalized): Secondary | ICD-10-CM

## 2024-02-10 DIAGNOSIS — M25511 Pain in right shoulder: Secondary | ICD-10-CM | POA: Diagnosis not present

## 2024-02-10 NOTE — Therapy (Signed)
OUTPATIENT PHYSICAL THERAPY TREATMENT   Patient Name: Meredith York MRN: [id number] DOB:1976/10/05, 48 y.o., female Today's Date: 02/10/2024   END OF SESSION:  PT End of Session - 02/10/24 1418     Visit Number 3    Number of Visits 17    Date for PT Re-Evaluation 03/21/24    Authorization Type [employer]    Authorization - Number of Visits 27    Progress Note Due on Visit --    PT Start Time 1317    PT Stop Time 1400    PT Time Calculation (min) 43 min    Activity Tolerance Patient tolerated treatment well    Behavior During Therapy [facility] for tasks assessed/performed             Past Medical History:  Diagnosis Date   Carpal tunnel syndrome on right    Chlamydia    Gestational diabetes mellitus    Hypertension    no medicaqtions at this time   Migraines    Ovarian cyst    S/P lumbar discectomy 04/10/2015   Tendonitis    right wrist   Trichomonas    Type 2 diabetes mellitus (HCC)    not currently taking medication   Past Surgical History:  Procedure Laterality Date   ABDOMINAL HYSTERECTOMY     BACK SURGERY     CESAREAN SECTION  12/27/2011   Procedure: CESAREAN SECTION;  Surgeon: Brock Bad, MD;  Location: [facility];  Service: Gynecology;  Laterality: N/A;  Primary cesarean section with delivery of baby girl at 27. Apgars 3/3/6   CESAREAN SECTION N/A 08/03/2016   Procedure: CESAREAN SECTION;  Surgeon: Tereso Newcomer, MD;  Location: [facility];  Service: Obstetrics;  Laterality: N/A;   CHOLECYSTECTOMY  02/2009   COLONOSCOPY  07/30/2023   Tiajuana Amass at [facility]   DILATION AND CURETTAGE OF UTERUS N/A 06/25/2014   Procedure: DILATATION AND CURETTAGE;  Surgeon: Reva Bores, MD;  Location: [facility];  Service: Gynecology;  Laterality: N/A;   LUMBAR LAMINECTOMY/DECOMPRESSION MICRODISCECTOMY Left 04/10/2015   Procedure: Left L5-S1 Microdiscectomy;  Surgeon: Eldred Manges, MD;  Location: [facility];  Service: Orthopedics;  Laterality: Left;   Rotator cuff  partial repair Left 04/06/2023   VAGINAL HYSTERECTOMY N/A 06/04/2020   Procedure: HYSTERECTOMY VAGINAL, MORCELLATION;  Surgeon: Hermina Staggers, MD;  Location: [facility];  Service: Gynecology;  Laterality: N/A;   WISDOM TOOTH EXTRACTION     Patient Active Problem List   Diagnosis Date Noted   Chronic right shoulder pain 01/25/2024   Body mass index 36.0-36.9, adult 08/16/2023   Exposure to sexually transmitted disease (STD) 07/08/2023   Flank pain 06/10/2023   Rotator cuff tear, left 01/18/2023   OA (osteoarthritis) of shoulder 12/03/2022   Peroneal tendon tear, left, subsequent encounter 01/17/2021   Recurrent herniation of lumbar disc 10/30/2020   Type 2 diabetes mellitus with obesity (HCC) 09/24/2020   Visit for routine gyn exam 11/29/2019   Encounter for screening mammogram for breast cancer 11/29/2019   History of bilateral tubal ligation 11/29/2019   S/P cesarean section 07/14/2016   Hypertension 07/05/2011   PCP: [name], FNP  REFERRING PROVIDER: Jones Broom, MD  REFERRING DIAG: M25.511 (ICD-10-CM) - Right shoulder pain   THERAPY DIAG:  Chronic right shoulder pain  Muscle weakness (generalized)  Rationale for Evaluation and Treatment: Rehabilitation  ONSET DATE: Chronic, ~October of 2024   SUBJECTIVE:  SUBJECTIVE STATEMENT: Patient reports pain is the same, her shoulder constantly hurts. She states she may have over-did it at work yesterday and is a little flared up.   Eval: Patient comes in with c/c of right shoulder pain that is constant. It wakes her up at night and is affecting her ability to work. Overhead motions are the most painful to her and repetitive motions hurt a lot. Tender to the touch in all areas including bicipital groove. Recent cortisone shot  worked for two weeks but then stopped. Patient is still using the lidocaine patches that don't help much aside for providing slight pain relief. Patient reports she is only sleeping about 5 hours per night because of shoulder pain. Numbness and tingling to the elbow almost always.   Hand dominance: Right  PERTINENT HISTORY: Partial RTC repair on left shoulder 2024  PAIN:  Are you having pain? Yes:  NPRS scale: 10/10 at worst, 7.5/10 current Pain location: Right shoulder in all areas Pain description: Sharp, burning Aggravating factors: movement overhead, to the side, repetitive motions  Relieving factors: ice  PRECAUTIONS: None  RED FLAGS: None   WEIGHT BEARING RESTRICTIONS: No  FALLS:  Has patient fallen in last 6 months? No  LIVING ENVIRONMENT: Lives with: lives with their family Lives in: House/apartment No difficulty with ambulation at home  OCCUPATION: Custodian   PLOF: Independent  PATIENT GOALS: Return to ADLs and work without pain, sleep through the night.  NEXT MD VISIT: March 5th   OBJECTIVE:  Note: Objective measures were completed at Evaluation unless otherwise noted. PATIENT SURVEYS:  Neldon Mc 37/50   POSTURE: Forward posture, rounded shoulders.   UPPER EXTREMITY ROM:   Active ROM Right eval Left eval  Shoulder flexion 75 145  Shoulder extension    Shoulder abduction 100 165  Shoulder adduction    Shoulder internal rotation L4   Shoulder external rotation 30 65  Elbow flexion    Elbow extension    Wrist flexion    Wrist extension    Wrist ulnar deviation    Wrist radial deviation    Wrist pronation    Wrist supination    (Blank rows = not tested)  UPPER EXTREMITY MMT:  MMT Right eval Left eval  Shoulder flexion 3 5  Shoulder extension    Shoulder abduction 3 4  Shoulder adduction    Shoulder internal rotation 3 5  Shoulder external rotation 3 5  Middle trapezius 3   Lower trapezius 3   Elbow flexion 4-   Elbow extension     Wrist flexion    Wrist extension    Wrist ulnar deviation    Wrist radial deviation    Wrist pronation    Wrist supination    Grip strength (lbs) 0 lbs 60 lbs  (Blank rows = not tested)  SHOULDER SPECIAL TESTS: Perform ULTT when patient can tolerate ROM   JOINT MOBILITY TESTING:  Anterior, inferior, and posterior glides all painful with no relief   PALPATION:  Tenderness to anterior, posterior, and lateral shoulder on immediate palpation  TREATMENT DATE:  02/10/2024 PROM right shoulder - limited tolerance due to pain Seated overhead shoulder pulley flexion x 3 min, abduction x 3 min Supine dowel chest press 2 x 10 Supine dowel shoulder flexion 2 x 10 - through comfortable range Supine dowel IR and ER 2 x 10  STM right upper trap with patient in supine Seated upper trap and levator scap stretch 3 x 10 sec each Seated scapular squeeze 10 x 5 sec Seated table slide shoulder flexion and scaption x 10 Banded row with red 2 x 10  PATIENT EDUCATION: Education details: HEP update Person educated: Patient Education method: Explanation, Demonstration, Tactile cues, Handout Education comprehension: verbalized understanding and returned demonstration  HOME EXERCISE PROGRAM: Access Code: [email or web address]    ASSESSMENT: CLINICAL IMPRESSION: Patient with fair tolerance for therapy this visit due to high pain level, no adverse effects reports. Therapy focused on progressing right shoulder motion as tolerated. She exhibits guarding and pain with shoulder PROM, but did tolerate STM for the right upper trap with report of slight improvement of pain following treatment. Continued with AAROM exercises for shoulder elevation, and she was able to tolerate resisted periscapular strengthening. Updated HEP to add banded rows and patient would benefit from continued skilled PT to progress her shoulder mobility and strength  in order to reduce pain and maximize functional ability.   EVAL: Patient is a 48 y.o. female who was seen today for physical therapy evaluation and treatment for c/c of chronic right shoulder pain. Patient is limited in ROM on the right side, PROM > AROM but limited due to muscle guarding and pain. ROM limitations most prominent in flexion, abduction, and ER. Patient has strength deficits on right side, with all right UE scoring a 3/5 and unable to resist against gravity.  Grip strength on the right UE is 0 lbs. Patient exhibits high pain levels that increase with all motions. Tenderness to palpation of anterior, posterior, and superior shoulder region, deltoid tendon, and all trap regions. Patient scored a 37/50 on the QuickDASH indicating high impact on function and ability to perform ADLs. Overall, patient would benefit from skilled PT in order to increase ROM and strength in order to decrease pain levels.   OBJECTIVE IMPAIRMENTS: decreased ROM, decreased strength, postural dysfunction, and pain.   ACTIVITY LIMITATIONS: carrying, lifting, sleeping, dressing, and reach over head  PARTICIPATION LIMITATIONS: community activity and occupation  PERSONAL FACTORS: Time since onset of injury/illness/exacerbation are also affecting patient's functional outcome.    GOALS: Goals reviewed with patient? Yes  SHORT TERM GOALS: Target date: 02/22/2024  Patient will be I with initial HEP in order to progress with therapy. Baseline: Goal status: INITIAL  2.  Patient will score </= 7/10 at worse on the NPS by 3rd visit in order to show an improvement in pain allowing for an increase in functional ability.  Baseline: 10/10 Goal status: INITIAL  LONG TERM GOALS: Target date: 03/21/2024  Patient will be independent with final HEP in order to continue treatment at home for pain tolerance.  Baseline:  Goal status: INITIAL  2.  Patient will score </= 5/10 at worse on the NPS to show an improvement in pain  allowing for an increase in functional ability.  Baseline: 10/10 Goal status: INITIAL  3.  Patient will increase ROM by at least 30 degrees in shoulder flexion, abduction, and ER to increase functional ability.  Baseline: see above Goal status: INITIAL  4.  Patient will increase strength to a 4/5 to show  an increase in functional activity  Baseline: see above Goal status: INITIAL  5.  Patient will score at least a 27/50 on the QuickDASH to show an improvement in functional levels to return to ADLs.  Baseline: 37/50 Goal status: INITIAL   PLAN: PT FREQUENCY: 2x/week  PT DURATION: 8 weeks  PLANNED INTERVENTIONS: 97164- PT Re-evaluation, 97110-Therapeutic exercises, 97530- Therapeutic activity, 97112- Neuromuscular re-education, 97535- Self Care, [id number]- Manual therapy, Patient/Family education, Dry Needling, Joint mobilization, Joint manipulation, Spinal manipulation, Spinal mobilization, Cryotherapy, and Moist heat  PLAN FOR NEXT SESSION: Review/Revise HEP. Continue working on pain management. Increase ROM specifically flexion, abduction, and ER. Periscapular strengthening.    Rosana Hoes, PT, DPT, LAT, ATC 02/10/24  3:48 PM Phone: [phone] Fax: [phone]

## 2024-02-10 NOTE — Patient Instructions (Signed)
Access Code: [email or web address] URL: [email or web address] Date: 02/10/2024 Prepared by: Rosana Hoes  Exercises - Supine Shoulder Press with Dowel  - 2 x daily - 15 reps - Supine Shoulder External Rotation AAROM with Dowel  - 2 x daily - 15 reps - Gentle Upper Trap Stretch  - 2 x daily - 5 reps - 10 seconds hold - Seated Scapular Retraction  - 2 x daily - 2 sets - 10 reps - 3 seconds hold - Putty Squeezes  - 2 x daily - Standing Row with Anchored Resistance  - 1 x daily - 2 sets - 10 reps - 3 seconds hold

## 2024-02-15 ENCOUNTER — Other Ambulatory Visit: Payer: Self-pay

## 2024-02-15 ENCOUNTER — Ambulatory Visit: Payer: Medicaid Other | Admitting: Physical Therapy

## 2024-02-15 DIAGNOSIS — G8929 Other chronic pain: Secondary | ICD-10-CM

## 2024-02-15 DIAGNOSIS — M6281 Muscle weakness (generalized): Secondary | ICD-10-CM

## 2024-02-15 DIAGNOSIS — M25511 Pain in right shoulder: Secondary | ICD-10-CM | POA: Diagnosis not present

## 2024-02-15 NOTE — Therapy (Cosign Needed)
 OUTPATIENT PHYSICAL THERAPY TREATMENT   Patient Name: Meredith York MRN: [id number] DOB:[birth year], 48 y.o., female Today's Date: 02/15/2024  END OF SESSION:  PT End of Session - 02/15/24 1651     Visit Number 4    Number of Visits 17    Date for PT Re-Evaluation 03/21/24    Authorization Type [employer]    Authorization - Number of Visits 27    PT Start Time 1617    PT Stop Time 1655    PT Time Calculation (min) 38 min    Activity Tolerance Patient tolerated treatment well    Behavior During Therapy WFL for tasks assessed/performed            Past Medical History:  Diagnosis Date   Carpal tunnel syndrome on right    Chlamydia    Gestational diabetes mellitus    Hypertension    no medicaqtions at this time   Migraines    Ovarian cyst    S/P lumbar discectomy 04/10/2015   Tendonitis    right wrist   Trichomonas    Type 2 diabetes mellitus (HCC)    not currently taking medication   Past Surgical History:  Procedure Laterality Date   ABDOMINAL HYSTERECTOMY     BACK SURGERY     CESAREAN SECTION  12/27/2011   Procedure: CESAREAN SECTION;  Surgeon: Brock Bad, MD;  Location: [facility];  Service: Gynecology;  Laterality: N/A;  Primary cesarean section with delivery of baby girl at 32. Apgars 3/3/6   CESAREAN SECTION N/A 08/03/2016   Procedure: CESAREAN SECTION;  Surgeon: Tereso Newcomer, MD;  Location: [facility];  Service: Obstetrics;  Laterality: N/A;   CHOLECYSTECTOMY  02/2009   COLONOSCOPY  07/30/2023   Tiajuana Amass at [facility]   DILATION AND CURETTAGE OF UTERUS N/A 06/25/2014   Procedure: DILATATION AND CURETTAGE;  Surgeon: Reva Bores, MD;  Location: [facility];  Service: Gynecology;  Laterality: N/A;   LUMBAR LAMINECTOMY/DECOMPRESSION MICRODISCECTOMY Left 04/10/2015   Procedure: Left L5-S1 Microdiscectomy;  Surgeon: Eldred Manges, MD;  Location: [facility] OR;  Service: Orthopedics;  Laterality: Left;   Rotator cuff partial repair Left 04/06/2023    VAGINAL HYSTERECTOMY N/A 06/04/2020   Procedure: HYSTERECTOMY VAGINAL, MORCELLATION;  Surgeon: Hermina Staggers, MD;  Location: [facility];  Service: Gynecology;  Laterality: N/A;   WISDOM TOOTH EXTRACTION     Patient Active Problem List   Diagnosis Date Noted   Chronic right shoulder pain 01/25/2024   Body mass index 36.0-36.9, adult 08/16/2023   Exposure to sexually transmitted disease (STD) 07/08/2023   Flank pain 06/10/2023   Rotator cuff tear, left 01/18/2023   OA (osteoarthritis) of shoulder 12/03/2022   Peroneal tendon tear, left, subsequent encounter 01/17/2021   Recurrent herniation of lumbar disc 10/30/2020   Type 2 diabetes mellitus with obesity (HCC) 09/24/2020   Visit for routine gyn exam 11/29/2019   Encounter for screening mammogram for breast cancer 11/29/2019   History of bilateral tubal ligation 11/29/2019   S/P cesarean section 07/14/2016   Hypertension 07/05/2011   PCP: [name], FNP  REFERRING PROVIDER: Jones Broom, MD  REFERRING DIAG: M25.511 (ICD-10-CM) - Right shoulder pain   THERAPY DIAG:  Chronic right shoulder pain  Muscle weakness (generalized)  Rationale for Evaluation and Treatment: Rehabilitation  ONSET DATE: Chronic, ~October of 2024  SUBJECTIVE:  SUBJECTIVE STATEMENT: Patient reports pain is the same and she does not feel any improvement. Is compliant with HEP at home. Patient sees the doctor next Thursday.    Eval: Patient comes in with c/c of right shoulder pain that is constant. It wakes her up at night and is affecting her ability to work. Overhead motions are the most painful to her and repetitive motions hurt a lot. Tender to the touch in all areas including bicipital groove. Recent cortisone shot worked for two weeks but then stopped.  Patient is still using the lidocaine patches that don't help much aside for providing slight pain relief. Patient reports she is only sleeping about 5 hours per night because of shoulder pain. Numbness and tingling to the elbow almost always.   Hand dominance: Right  PERTINENT HISTORY: Partial RTC repair on left shoulder 2024  PAIN:  Are you having pain? Yes:  NPRS scale: 10/10 at worst, 7.5/10 current Pain location: Right shoulder in all areas Pain description: Sharp, burning Aggravating factors: movement overhead, to the side, repetitive motions  Relieving factors: ice  PRECAUTIONS: None  RED FLAGS: None   WEIGHT BEARING RESTRICTIONS: No  FALLS:  Has patient fallen in last 6 months? No  LIVING ENVIRONMENT: Lives with: lives with their family Lives in: House/apartment No difficulty with ambulation at home  OCCUPATION: Custodian   PLOF: Independent  PATIENT GOALS: Return to ADLs and work without pain, sleep through the night.  NEXT MD VISIT: March 5th   OBJECTIVE:  Note: Objective measures were completed at Evaluation unless otherwise noted. PATIENT SURVEYS:  Neldon Mc 37/50   POSTURE: Forward posture, rounded shoulders.   UPPER EXTREMITY ROM:   Active ROM Right eval Left eval  Shoulder flexion 75 145  Shoulder extension    Shoulder abduction 100 165  Shoulder adduction    Shoulder internal rotation L4   Shoulder external rotation 30 65  Elbow flexion    Elbow extension    Wrist flexion    Wrist extension    Wrist ulnar deviation    Wrist radial deviation    Wrist pronation    Wrist supination    (Blank rows = not tested)  UPPER EXTREMITY MMT:  MMT Right eval Left eval  Shoulder flexion 3 5  Shoulder extension    Shoulder abduction 3 4  Shoulder adduction    Shoulder internal rotation 3 5  Shoulder external rotation 3 5  Middle trapezius 3   Lower trapezius 3   Elbow flexion 4-   Elbow extension    Wrist flexion    Wrist extension     Wrist ulnar deviation    Wrist radial deviation    Wrist pronation    Wrist supination    Grip strength (lbs) 0 lbs 60 lbs  (Blank rows = not tested)  SHOULDER SPECIAL TESTS: Perform ULTT when patient can tolerate ROM   JOINT MOBILITY TESTING:  Anterior, inferior, and posterior glides all painful with no relief   PALPATION:  Tenderness to anterior, posterior, and lateral shoulder on immediate palpation  TREATMENT DATE:  02/15/2024 PROM right shoulder - limited tolerance due to pain Supine dowel chest press 2 x 8 #1 Supine dowel shoulder flexion 2 x 8 #1 - through comfortable range Supine dowel IR and ER 2 x 10  Seated scapular squeeze 1x15 Wall slides with ball shoulder flexion 1x15  Wall slides with ball shoulder abduction 1x15  Banded row with red 1x15 Wall walks flexion 1x10   Manual Therapy:  STM right upper trap with patient in supine  PATIENT EDUCATION: Education details: HEP update Person educated: Patient Education method: Explanation, Demonstration, Tactile cues, Handout Education comprehension: verbalized understanding and returned demonstration  HOME EXERCISE PROGRAM: Access Code: [email or web address]   ASSESSMENT: CLINICAL IMPRESSION: Patient responded with fair tolerance to therapy this visit due to high pain level, no adverse effects reports. Patient continues to be very point tender to right anterior and lateral shoulder capsule and restricted in AROM/PROM due to pain. Tolerated STM to upper trap to reduce tightness. Continued with AAROM and periscapular strengthening today. Will continue to try to progress dependent on pain tolerance until her appointment with the dr. next week. Patient would benefit from continued skilled PT to continue to increase shoulder mobility and strength to reduce pain and maximize functional ability.   EVAL: Patient is a 48 y.o. female who was seen today for physical  therapy evaluation and treatment for c/c of chronic right shoulder pain. Patient is limited in ROM on the right side, PROM > AROM but limited due to muscle guarding and pain. ROM limitations most prominent in flexion, abduction, and ER. Patient has strength deficits on right side, with all right UE scoring a 3/5 and unable to resist against gravity.  Grip strength on the right UE is 0 lbs. Patient exhibits high pain levels that increase with all motions. Tenderness to palpation of anterior, posterior, and superior shoulder region, deltoid tendon, and all trap regions. Patient scored a 37/50 on the QuickDASH indicating high impact on function and ability to perform ADLs. Overall, patient would benefit from skilled PT in order to increase ROM and strength in order to decrease pain levels.   OBJECTIVE IMPAIRMENTS: decreased ROM, decreased strength, postural dysfunction, and pain.   ACTIVITY LIMITATIONS: carrying, lifting, sleeping, dressing, and reach over head  PARTICIPATION LIMITATIONS: community activity and occupation  PERSONAL FACTORS: Time since onset of injury/illness/exacerbation are also affecting patient's functional outcome.    GOALS: Goals reviewed with patient? Yes  SHORT TERM GOALS: Target date: 02/22/2024  Patient will be I with initial HEP in order to progress with therapy. Baseline: Goal status: INITIAL  2.  Patient will score </= 7/10 at worse on the NPS by 3rd visit in order to show an improvement in pain allowing for an increase in functional ability.  Baseline: 10/10 Goal status: INITIAL  LONG TERM GOALS: Target date: 03/21/2024  Patient will be independent with final HEP in order to continue treatment at home for pain tolerance.  Baseline:  Goal status: INITIAL  2.  Patient will score </= 5/10 at worse on the NPS to show an improvement in pain allowing for an increase in functional ability.  Baseline: 10/10 Goal status: INITIAL  3.  Patient will increase ROM by at  least 30 degrees in shoulder flexion, abduction, and ER to increase functional ability.  Baseline: see above Goal status: INITIAL  4.  Patient will increase strength to a 4/5 to show an increase in functional activity  Baseline: see above Goal status: INITIAL  5.  Patient  will score at least a 27/50 on the QuickDASH to show an improvement in functional levels to return to ADLs.  Baseline: 37/50 Goal status: INITIAL  PLAN: PT FREQUENCY: 2x/week  PT DURATION: 8 weeks  PLANNED INTERVENTIONS: 97164- PT Re-evaluation, 97110-Therapeutic exercises, 97530- Therapeutic activity, 97112- Neuromuscular re-education, 97535- Self Care, [id number]- Manual therapy, Patient/Family education, Dry Needling, Joint mobilization, Joint manipulation, Spinal manipulation, Spinal mobilization, Cryotherapy, and Moist heat  PLAN FOR NEXT SESSION: Review/Revise HEP. Continue working on pain management. Increase ROM specifically flexion, abduction, and ER. Periscapular strengthening.    Erin Hearing, Student-PT 02/15/2024, 4:58 PM

## 2024-02-17 ENCOUNTER — Ambulatory Visit: Payer: Medicaid Other | Admitting: Physical Therapy

## 2024-02-17 ENCOUNTER — Encounter: Payer: Self-pay | Admitting: Physical Therapy

## 2024-02-17 ENCOUNTER — Other Ambulatory Visit: Payer: Self-pay

## 2024-02-17 DIAGNOSIS — M25511 Pain in right shoulder: Secondary | ICD-10-CM | POA: Diagnosis not present

## 2024-02-17 DIAGNOSIS — G8929 Other chronic pain: Secondary | ICD-10-CM

## 2024-02-17 DIAGNOSIS — M6281 Muscle weakness (generalized): Secondary | ICD-10-CM

## 2024-02-17 NOTE — Therapy (Incomplete)
 OUTPATIENT PHYSICAL THERAPY TREATMENT   Patient Name: Meredith York MRN: [id number] DOB:16-Nov-1976, 48 y.o., female Today's Date: 02/17/2024  END OF SESSION:  PT End of Session - 02/17/24 1655     Visit Number 5    Number of Visits 17    Date for PT Re-Evaluation 03/21/24    Authorization Type [employer]    Progress Note Due on Visit 27    PT Start Time 1615    PT Stop Time 1657    PT Time Calculation (min) 42 min    Activity Tolerance Patient tolerated treatment well    Behavior During Therapy [facility] for tasks assessed/performed             Past Medical History:  Diagnosis Date   Carpal tunnel syndrome on right    Chlamydia    Gestational diabetes mellitus    Hypertension    no medicaqtions at this time   Migraines    Ovarian cyst    S/P lumbar discectomy 04/10/2015   Tendonitis    right wrist   Trichomonas    Type 2 diabetes mellitus (HCC)    not currently taking medication   Past Surgical History:  Procedure Laterality Date   ABDOMINAL HYSTERECTOMY     BACK SURGERY     CESAREAN SECTION  12/27/2011   Procedure: CESAREAN SECTION;  Surgeon: Brock Bad, MD;  Location: [facility];  Service: Gynecology;  Laterality: N/A;  Primary cesarean section with delivery of baby girl at 67. Apgars 3/3/6   CESAREAN SECTION N/A 08/03/2016   Procedure: CESAREAN SECTION;  Surgeon: Tereso Newcomer, MD;  Location: [facility];  Service: Obstetrics;  Laterality: N/A;   CHOLECYSTECTOMY  02/2009   COLONOSCOPY  07/30/2023   Tiajuana Amass at [facility]   DILATION AND CURETTAGE OF UTERUS N/A 06/25/2014   Procedure: DILATATION AND CURETTAGE;  Surgeon: Reva Bores, MD;  Location: [facility];  Service: Gynecology;  Laterality: N/A;   LUMBAR LAMINECTOMY/DECOMPRESSION MICRODISCECTOMY Left 04/10/2015   Procedure: Left L5-S1 Microdiscectomy;  Surgeon: Eldred Manges, MD;  Location: [facility] OR;  Service: Orthopedics;  Laterality: Left;   Rotator cuff partial repair Left 04/06/2023   VAGINAL  HYSTERECTOMY N/A 06/04/2020   Procedure: HYSTERECTOMY VAGINAL, MORCELLATION;  Surgeon: Hermina Staggers, MD;  Location: [facility];  Service: Gynecology;  Laterality: N/A;   WISDOM TOOTH EXTRACTION     Patient Active Problem List   Diagnosis Date Noted   Chronic right shoulder pain 01/25/2024   Body mass index 36.0-36.9, adult 08/16/2023   Exposure to sexually transmitted disease (STD) 07/08/2023   Flank pain 06/10/2023   Rotator cuff tear, left 01/18/2023   OA (osteoarthritis) of shoulder 12/03/2022   Peroneal tendon tear, left, subsequent encounter 01/17/2021   Recurrent herniation of lumbar disc 10/30/2020   Type 2 diabetes mellitus with obesity (HCC) 09/24/2020   Visit for routine gyn exam 11/29/2019   Encounter for screening mammogram for breast cancer 11/29/2019   History of bilateral tubal ligation 11/29/2019   S/P cesarean section 07/14/2016   Hypertension 07/05/2011   PCP: [name], FNP  REFERRING PROVIDER: Jones Broom, MD  REFERRING DIAG: M25.511 (ICD-10-CM) - Right shoulder pain   THERAPY DIAG:  Chronic right shoulder pain  Muscle weakness (generalized)  Rationale for Evaluation and Treatment: Rehabilitation  ONSET DATE: Chronic, ~October of 2024  SUBJECTIVE:  SUBJECTIVE STATEMENT: Patient reports she is feeling improvement today. She has not been working this week and notices settle decreases in her shoulder pain. She still is experiencing high levels of pain, rating pain today at a 7/10. Notices tightness in her upper trap more and more.    Eval: Patient comes in with c/c of right shoulder pain that is constant. It wakes her up at night and is affecting her ability to work. Overhead motions are the most painful to her and repetitive motions hurt a lot. Tender to  the touch in all areas including bicipital groove. Recent cortisone shot worked for two weeks but then stopped. Patient is still using the lidocaine patches that don't help much aside for providing slight pain relief. Patient reports she is only sleeping about 5 hours per night because of shoulder pain. Numbness and tingling to the elbow almost always.   Hand dominance: Right  PERTINENT HISTORY: Partial RTC repair on left shoulder 2024  PAIN:  Are you having pain? Yes:  NPRS scale: 10/10 at worst, 7.5/10 current Pain location: Right shoulder in all areas Pain description: Sharp, burning Aggravating factors: movement overhead, to the side, repetitive motions  Relieving factors: ice  PRECAUTIONS: None  RED FLAGS: None   WEIGHT BEARING RESTRICTIONS: No  FALLS:  Has patient fallen in last 6 months? No  LIVING ENVIRONMENT: Lives with: lives with their family Lives in: House/apartment No difficulty with ambulation at home  OCCUPATION: Custodian   PLOF: Independent  PATIENT GOALS: Return to ADLs and work without pain, sleep through the night.  NEXT MD VISIT: March 5th   OBJECTIVE:  Note: Objective measures were completed at Evaluation unless otherwise noted. PATIENT SURVEYS:  Neldon Mc 37/50   POSTURE: Forward posture, rounded shoulders.   UPPER EXTREMITY ROM:   Active ROM Right eval Left eval  Shoulder flexion 75 145  Shoulder extension    Shoulder abduction 100 165  Shoulder adduction    Shoulder internal rotation L4   Shoulder external rotation 30 65  Elbow flexion    Elbow extension    Wrist flexion    Wrist extension    Wrist ulnar deviation    Wrist radial deviation    Wrist pronation    Wrist supination    (Blank rows = not tested)  UPPER EXTREMITY MMT:  MMT Right eval Left eval  Shoulder flexion 3 5  Shoulder extension    Shoulder abduction 3 4  Shoulder adduction    Shoulder internal rotation 3 5  Shoulder external rotation 3 5   Middle trapezius 3   Lower trapezius 3   Elbow flexion 4-   Elbow extension    Wrist flexion    Wrist extension    Wrist ulnar deviation    Wrist radial deviation    Wrist pronation    Wrist supination    Grip strength (lbs) 0 lbs 60 lbs  (Blank rows = not tested)  SHOULDER SPECIAL TESTS: Perform ULTT when patient can tolerate ROM   JOINT MOBILITY TESTING:  Anterior, inferior, and posterior glides all painful with no relief   PALPATION:  Tenderness to anterior, posterior, and lateral shoulder on immediate palpation  TREATMENT DATE:  02/15/2024 PROM right shoulder - limited tolerance due to pain Supine dowel chest press 3x10  Supine dowel shoulder flexion 3x10  Supine dowel IR and ER 2 x 10  Seated abduction towel slides 1x15 Seated scapular squeeze 1x15 Digiflex green x2 min  Shoulder flexion FR roll outs 2x10  Banded row with GTB 2x10  PATIENT EDUCATION: Education details: HEP update Person educated: Patient Education method: Programmer, multimedia, Demonstration, Actor cues, Handout Education comprehension: verbalized understanding and returned demonstration  HOME EXERCISE PROGRAM: Access Code: [email or web address]    ASSESSMENT: CLINICAL IMPRESSION: Patient responded with fair tolerance to therapy this visit due to high pain level, no adverse effects reported. Patient continues to be point tender to all palpation on right anterior and lateral shoulder. Tightness in right upper trap notable, dry needling done today to help elevate discomfort. Patient continues to have restricted AROM/PROM due to pain. Passive range of motion today allowed for about 10 more degrees motion before pain was intolerable. Continued with AAROM and periscapular strengthening today. Added in some grip strength. Will continue to try to progress dependent on pain tolerance. Patient would benefit from continued skilled PT to continue to increase shoulder  mobility and strength to reduce pain and maximize functional ability.   EVAL: Patient is a 48 y.o. female who was seen today for physical therapy evaluation and treatment for c/c of chronic right shoulder pain. Patient is limited in ROM on the right side, PROM > AROM but limited due to muscle guarding and pain. ROM limitations most prominent in flexion, abduction, and ER. Patient has strength deficits on right side, with all right UE scoring a 3/5 and unable to resist against gravity.  Grip strength on the right UE is 0 lbs. Patient exhibits high pain levels that increase with all motions. Tenderness to palpation of anterior, posterior, and superior shoulder region, deltoid tendon, and all trap regions. Patient scored a 37/50 on the QuickDASH indicating high impact on function and ability to perform ADLs. Overall, patient would benefit from skilled PT in order to increase ROM and strength in order to decrease pain levels.   OBJECTIVE IMPAIRMENTS: decreased ROM, decreased strength, postural dysfunction, and pain.   ACTIVITY LIMITATIONS: carrying, lifting, sleeping, dressing, and reach over head  PARTICIPATION LIMITATIONS: community activity and occupation  PERSONAL FACTORS: Time since onset of injury/illness/exacerbation are also affecting patient's functional outcome.    GOALS: Goals reviewed with patient? Yes  SHORT TERM GOALS: Target date: 02/22/2024  Patient will be I with initial HEP in order to progress with therapy. Baseline: Goal status: MET  2.  Patient will score </= 7/10 at worse on the NPS by 3rd visit in order to show an improvement in pain allowing for an increase in functional ability.  Baseline: 10/10 02/17/2024: 7/10 Goal status: MET  LONG TERM GOALS: Target date: 03/21/2024  Patient will be independent with final HEP in order to continue treatment at home for pain tolerance.  Baseline:  Goal status: INITIAL  2.  Patient will score </= 5/10 at worse on the NPS to show an  improvement in pain allowing for an increase in functional ability.  Baseline: 10/10 Goal status: INITIAL  3.  Patient will increase ROM by at least 30 degrees in shoulder flexion, abduction, and ER to increase functional ability.  Baseline: see above Goal status: INITIAL  4.  Patient will increase strength to a 4/5 to show an increase in functional activity  Baseline: see above Goal status: INITIAL  5.  Patient will score at least a 27/50 on the QuickDASH to show an improvement in functional levels to return to ADLs.  Baseline: 37/50 Goal status: INITIAL  PLAN: PT FREQUENCY: 2x/week  PT DURATION: 8 weeks  PLANNED INTERVENTIONS: 97164- PT Re-evaluation, 97110-Therapeutic exercises, 97530- Therapeutic activity, 97112- Neuromuscular re-education, 97535- Self Care, 09811- Manual therapy, Patient/Family education, Dry Needling, Joint mobilization, Joint manipulation, Spinal manipulation, Spinal mobilization, Cryotherapy, and Moist heat  PLAN FOR NEXT SESSION: Review/Revise HEP. Continue working on pain management. Increase ROM specifically flexion, abduction, and ER. Periscapular strengthening. Grip strength.    Erin Hearing, Student-PT 02/17/2024, 4:58 PM

## 2024-02-21 ENCOUNTER — Ambulatory Visit: Payer: Medicaid Other | Attending: Nurse Practitioner | Admitting: Physical Therapy

## 2024-02-21 ENCOUNTER — Encounter: Payer: Self-pay | Admitting: Physical Therapy

## 2024-02-21 ENCOUNTER — Other Ambulatory Visit: Payer: Self-pay

## 2024-02-21 DIAGNOSIS — M6281 Muscle weakness (generalized): Secondary | ICD-10-CM | POA: Insufficient documentation

## 2024-02-21 DIAGNOSIS — M25511 Pain in right shoulder: Secondary | ICD-10-CM | POA: Insufficient documentation

## 2024-02-21 DIAGNOSIS — G8929 Other chronic pain: Secondary | ICD-10-CM | POA: Diagnosis present

## 2024-02-21 NOTE — Therapy (Signed)
 OUTPATIENT PHYSICAL THERAPY TREATMENT   Patient Name: Meredith York MRN: [id number] DOB:08-29-1976, 48 y.o., female Today's Date: 02/21/2024   END OF SESSION:  PT End of Session - 02/21/24 1702     Visit Number 6    Number of Visits 17    Date for PT Re-Evaluation 03/21/24    Authorization Type [employer]    Authorization - Number of Visits 27    PT Start Time 1615    PT Stop Time 1655    PT Time Calculation (min) 40 min    Activity Tolerance Patient tolerated treatment well    Behavior During Therapy [facility] for tasks assessed/performed             Past Medical History:  Diagnosis Date   Carpal tunnel syndrome on right    Chlamydia    Gestational diabetes mellitus    Hypertension    no medicaqtions at this time   Migraines    Ovarian cyst    S/P lumbar discectomy 04/10/2015   Tendonitis    right wrist   Trichomonas    Type 2 diabetes mellitus (HCC)    not currently taking medication   Past Surgical History:  Procedure Laterality Date   ABDOMINAL HYSTERECTOMY     BACK SURGERY     CESAREAN SECTION  12/27/2011   Procedure: CESAREAN SECTION;  Surgeon: Brock Bad, MD;  Location: [facility];  Service: Gynecology;  Laterality: N/A;  Primary cesarean section with delivery of baby girl at 54. Apgars 3/3/6   CESAREAN SECTION N/A 08/03/2016   Procedure: CESAREAN SECTION;  Surgeon: Tereso Newcomer, MD;  Location: [facility];  Service: Obstetrics;  Laterality: N/A;   CHOLECYSTECTOMY  02/2009   COLONOSCOPY  07/30/2023   Tiajuana Amass at [facility]   DILATION AND CURETTAGE OF UTERUS N/A 06/25/2014   Procedure: DILATATION AND CURETTAGE;  Surgeon: Reva Bores, MD;  Location: [facility];  Service: Gynecology;  Laterality: N/A;   LUMBAR LAMINECTOMY/DECOMPRESSION MICRODISCECTOMY Left 04/10/2015   Procedure: Left L5-S1 Microdiscectomy;  Surgeon: Eldred Manges, MD;  Location: [facility] OR;  Service: Orthopedics;  Laterality: Left;   Rotator cuff partial repair Left 04/06/2023    VAGINAL HYSTERECTOMY N/A 06/04/2020   Procedure: HYSTERECTOMY VAGINAL, MORCELLATION;  Surgeon: Hermina Staggers, MD;  Location: [facility];  Service: Gynecology;  Laterality: N/A;   WISDOM TOOTH EXTRACTION     Patient Active Problem List   Diagnosis Date Noted   Chronic right shoulder pain 01/25/2024   Body mass index 36.0-36.9, adult 08/16/2023   Exposure to sexually transmitted disease (STD) 07/08/2023   Flank pain 06/10/2023   Rotator cuff tear, left 01/18/2023   OA (osteoarthritis) of shoulder 12/03/2022   Peroneal tendon tear, left, subsequent encounter 01/17/2021   Recurrent herniation of lumbar disc 10/30/2020   Type 2 diabetes mellitus with obesity (HCC) 09/24/2020   Visit for routine gyn exam 11/29/2019   Encounter for screening mammogram for breast cancer 11/29/2019   History of bilateral tubal ligation 11/29/2019   S/P cesarean section 07/14/2016   Hypertension 07/05/2011   PCP: [name], FNP  REFERRING PROVIDER: Jones Broom, MD  REFERRING DIAG: M25.511 (ICD-10-CM) - Right shoulder pain   THERAPY DIAG:  Chronic right shoulder pain  Muscle weakness (generalized)  Rationale for Evaluation and Treatment: Rehabilitation  ONSET DATE: Chronic, ~October of 2024   SUBJECTIVE:  SUBJECTIVE STATEMENT: Patient reports she is feeling slightly better. The needling last visit helped her motion for a but but then it wore off pretty quickly.   Eval: Patient comes in with c/c of right shoulder pain that is constant. It wakes her up at night and is affecting her ability to work. Overhead motions are the most painful to her and repetitive motions hurt a lot. Tender to the touch in all areas including bicipital groove. Recent cortisone shot worked for two weeks but then stopped.  Patient is still using the lidocaine patches that don't help much aside for providing slight pain relief. Patient reports she is only sleeping about 5 hours per night because of shoulder pain. Numbness and tingling to the elbow almost always.   Hand dominance: Right  PERTINENT HISTORY: Partial RTC repair on left shoulder 2024  PAIN:  Are you having pain? Yes:  NPRS scale: 10/10 at worst, 7/10 current Pain location: Right shoulder in all areas Pain description: Sharp, burning Aggravating factors: movement overhead, to the side, repetitive motions  Relieving factors: ice  PRECAUTIONS: None  RED FLAGS: None   WEIGHT BEARING RESTRICTIONS: No  FALLS:  Has patient fallen in last 6 months? No  LIVING ENVIRONMENT: Lives with: lives with their family Lives in: House/apartment No difficulty with ambulation at home  OCCUPATION: Custodian   PLOF: Independent  PATIENT GOALS: Return to ADLs and work without pain, sleep through the night.  NEXT MD VISIT: March 5th   OBJECTIVE:  Note: Objective measures were completed at Evaluation unless otherwise noted. PATIENT SURVEYS:  Neldon Mc 37/50   POSTURE: Forward posture, rounded shoulders.   UPPER EXTREMITY ROM:   Active ROM Right eval Left eval Right 02/21/2024  Shoulder flexion 75 145 120 AAROM  Shoulder extension     Shoulder abduction 100 165   Shoulder adduction     Shoulder internal rotation L4    Shoulder external rotation 30 65   Elbow flexion     Elbow extension     Wrist flexion     Wrist extension     Wrist ulnar deviation     Wrist radial deviation     Wrist pronation     Wrist supination     (Blank rows = not tested)  UPPER EXTREMITY MMT:  MMT Right eval Left eval  Shoulder flexion 3 5  Shoulder extension    Shoulder abduction 3 4  Shoulder adduction    Shoulder internal rotation 3 5  Shoulder external rotation 3 5  Middle trapezius 3   Lower trapezius 3   Elbow flexion 4-   Elbow extension     Wrist flexion    Wrist extension    Wrist ulnar deviation    Wrist radial deviation    Wrist pronation    Wrist supination    Grip strength (lbs) 0 lbs 60 lbs  (Blank rows = not tested)  SHOULDER SPECIAL TESTS: Perform ULTT when patient can tolerate ROM   JOINT MOBILITY TESTING:  Anterior, inferior, and posterior glides all painful with no relief   PALPATION:  Tenderness to anterior, posterior, and lateral shoulder on immediate palpation  TREATMENT DATE:  [facility] Adult PT Treatment:                                                DATE: 02/21/2024 Right GHJ mobs to tolerance primarily inferior and posterior direction, distractive mobs with gentle oscillations to reduce pain and guarding PROM right shoulder all directions Supine dowel chest press x 10  Supine dowel shoulder flexion 2 x 10 Supine shoulder isometrics ER, IR, flexion, extension, abduction 2 x 5 x 10 sec each Seated table slides with FR for shoulder flexion 10 x 5 sec Standing wall slide shoulder flexion 3 x 6  PATIENT EDUCATION: Education details: HEP update Person educated: Patient Education method: Programmer, multimedia, Demonstration, Handout Education comprehension: verbalized understanding and returned demonstration  HOME EXERCISE PROGRAM: Access Code: [email or web address]    ASSESSMENT: CLINICAL IMPRESSION: Patient responded with fair tolerance to therapy this visit due to high pain level, no adverse effects reported. Therapy focused on progressing shoulder motion and incorporating isometrics. She did report slight improvement in shoulder symptoms following manual and was able to progress with isometrics with good tolerance. She does continue to report pain with all shoulder activity. AAROM improved this visit with table and wall slides. Updated HEP to progress her shoulder motion for home. Patient would benefit from continued skilled PT to continue to increase shoulder  mobility and strength to reduce pain and maximize functional ability.    EVAL: Patient is a 48 y.o. female who was seen today for physical therapy evaluation and treatment for c/c of chronic right shoulder pain. Patient is limited in ROM on the right side, PROM > AROM but limited due to muscle guarding and pain. ROM limitations most prominent in flexion, abduction, and ER. Patient has strength deficits on right side, with all right UE scoring a 3/5 and unable to resist against gravity.  Grip strength on the right UE is 0 lbs. Patient exhibits high pain levels that increase with all motions. Tenderness to palpation of anterior, posterior, and superior shoulder region, deltoid tendon, and all trap regions. Patient scored a 37/50 on the QuickDASH indicating high impact on function and ability to perform ADLs. Overall, patient would benefit from skilled PT in order to increase ROM and strength in order to decrease pain levels.   OBJECTIVE IMPAIRMENTS: decreased ROM, decreased strength, postural dysfunction, and pain.   ACTIVITY LIMITATIONS: carrying, lifting, sleeping, dressing, and reach over head  PARTICIPATION LIMITATIONS: community activity and occupation  PERSONAL FACTORS: Time since onset of injury/illness/exacerbation are also affecting patient's functional outcome.    GOALS: Goals reviewed with patient? Yes  SHORT TERM GOALS: Target date: 02/22/2024  Patient will be I with initial HEP in order to progress with therapy. Baseline: Goal status: MET  2.  Patient will score </= 7/10 at worse on the NPS by 3rd visit in order to show an improvement in pain allowing for an increase in functional ability.  Baseline: 10/10 02/17/2024: 7/10 Goal status: MET  LONG TERM GOALS: Target date: 03/21/2024  Patient will be independent with final HEP in order to continue treatment at home for pain tolerance.  Baseline:  Goal status: INITIAL  2.  Patient will score </= 5/10 at worse on the NPS to show  an improvement in pain allowing for an increase in functional ability.  Baseline: 10/10 Goal status: INITIAL  3.  Patient will increase  ROM by at least 30 degrees in shoulder flexion, abduction, and ER to increase functional ability.  Baseline: see above Goal status: INITIAL  4.  Patient will increase strength to a 4/5 to show an increase in functional activity  Baseline: see above Goal status: INITIAL  5.  Patient will score at least a 27/50 on the QuickDASH to show an improvement in functional levels to return to ADLs.  Baseline: 37/50 Goal status: INITIAL   PLAN: PT FREQUENCY: 2x/week  PT DURATION: 8 weeks  PLANNED INTERVENTIONS: 97164- PT Re-evaluation, 97110-Therapeutic exercises, 97530- Therapeutic activity, 97112- Neuromuscular re-education, 97535- Self Care, 16109- Manual therapy, Patient/Family education, Dry Needling, Joint mobilization, Joint manipulation, Spinal manipulation, Spinal mobilization, Cryotherapy, and Moist heat  PLAN FOR NEXT SESSION: Review/Revise HEP. Continue working on pain management. Increase ROM specifically flexion, abduction, and ER. Periscapular strengthening. Grip strength.    Rosana Hoes, PT, DPT, LAT, ATC 02/21/24  5:04 PM Phone: [phone] Fax: [phone]

## 2024-02-21 NOTE — Patient Instructions (Signed)
 Access Code: [email or web address] URL: [email or web address] Date: 02/21/2024 Prepared by: Rosana Hoes  Exercises - Supine Shoulder Press with Dowel  - 2 x daily - 15 reps - Supine Shoulder External Rotation AAROM with Dowel  - 2 x daily - 15 reps - Gentle Upper Trap Stretch  - 2 x daily - 5 reps - 10 seconds hold - Seated Scapular Retraction  - 2 x daily - 2 sets - 10 reps - 3 seconds hold - Putty Squeezes  - 2 x daily - Standing Row with Anchored Resistance  - 1 x daily - 2 sets - 10 reps - 3 seconds hold - Standing shoulder flexion wall slides  - 3 x daily - 3 sets - 6 reps

## 2024-02-23 ENCOUNTER — Ambulatory Visit: Payer: Medicaid Other | Admitting: Physical Therapy

## 2024-02-25 ENCOUNTER — Encounter: Payer: Self-pay | Admitting: Physical Therapy

## 2024-02-25 ENCOUNTER — Other Ambulatory Visit: Payer: Self-pay

## 2024-02-25 ENCOUNTER — Ambulatory Visit: Admitting: Physical Therapy

## 2024-02-25 DIAGNOSIS — G8929 Other chronic pain: Secondary | ICD-10-CM

## 2024-02-25 DIAGNOSIS — M25511 Pain in right shoulder: Secondary | ICD-10-CM | POA: Diagnosis not present

## 2024-02-25 DIAGNOSIS — M6281 Muscle weakness (generalized): Secondary | ICD-10-CM

## 2024-02-25 NOTE — Patient Instructions (Signed)
 Access Code: [email or web address] URL: [email or web address] Date: 02/25/2024 Prepared by: Rosana Hoes  Exercises - Supine Shoulder Flexion Extension AAROM with Dowel  - 2 x daily - 2 sets - 10 reps - Supine Shoulder External Rotation AAROM with Dowel  - 2 x daily - 2 sets - 10 reps - Gentle Upper Trap Stretch  - 2 x daily - 3 reps - 15 seconds hold - Seated Scapular Retraction  - 2 x daily - 2 sets - 10 reps - 3 seconds hold - Putty Squeezes  - 2 x daily - Standing Row with Anchored Resistance  - 1 x daily - 2 sets - 10 reps - Standing shoulder flexion wall slides  - 3 x daily - 3 sets - 6 reps - Isometric Shoulder Extension at Wall  - 1-2 x daily - 10 reps - 5 seconds hold - Isometric Shoulder Abduction at Wall  - 1-2 x daily - 10 reps - 5 seconds hold - Isometric Shoulder Flexion at Wall  - 1-2 x daily - 10 reps - 5 seconds hold - Standing Isometric Shoulder Internal Rotation at Doorway  - 1-2 x daily - 10 reps - 5 seconds hold - Standing Isometric Shoulder External Rotation with Doorway  - 1-2 x daily - 10 reps - 5 seconds hold

## 2024-02-25 NOTE — Therapy (Addendum)
 OUTPATIENT PHYSICAL THERAPY TREATMENT   Patient Name: Meredith York MRN: [id number] DOB:[birth year], 48 y.o., female Today's Date: 02/25/2024   END OF SESSION:  PT End of Session - 02/25/24 0807     Visit Number 7    Number of Visits 17    Date for PT Re-Evaluation 03/21/24    Authorization Type [employer]    Authorization - Number of Visits 27    PT Start Time 0800    PT Stop Time 0840    PT Time Calculation (min) 40 min    Activity Tolerance Patient tolerated treatment well    Behavior During Therapy [facility] for tasks assessed/performed              Past Medical History:  Diagnosis Date   Carpal tunnel syndrome on right    Chlamydia    Gestational diabetes mellitus    Hypertension    no medicaqtions at this time   Migraines    Ovarian cyst    S/P lumbar discectomy 04/10/2015   Tendonitis    right wrist   Trichomonas    Type 2 diabetes mellitus (HCC)    not currently taking medication   Past Surgical History:  Procedure Laterality Date   ABDOMINAL HYSTERECTOMY     BACK SURGERY     CESAREAN SECTION  12/27/2011   Procedure: CESAREAN SECTION;  Surgeon: Brock Bad, MD;  Location: [facility];  Service: Gynecology;  Laterality: N/A;  Primary cesarean section with delivery of baby girl at 52. Apgars 3/3/6   CESAREAN SECTION N/A 08/03/2016   Procedure: CESAREAN SECTION;  Surgeon: Tereso Newcomer, MD;  Location: [facility];  Service: Obstetrics;  Laterality: N/A;   CHOLECYSTECTOMY  02/2009   COLONOSCOPY  07/30/2023   Tiajuana Amass at [facility]   DILATION AND CURETTAGE OF UTERUS N/A 06/25/2014   Procedure: DILATATION AND CURETTAGE;  Surgeon: Reva Bores, MD;  Location: [facility];  Service: Gynecology;  Laterality: N/A;   LUMBAR LAMINECTOMY/DECOMPRESSION MICRODISCECTOMY Left 04/10/2015   Procedure: Left L5-S1 Microdiscectomy;  Surgeon: Eldred Manges, MD;  Location: [facility] OR;  Service: Orthopedics;  Laterality: Left;   Rotator cuff partial repair Left 04/06/2023    VAGINAL HYSTERECTOMY N/A 06/04/2020   Procedure: HYSTERECTOMY VAGINAL, MORCELLATION;  Surgeon: Hermina Staggers, MD;  Location: [facility];  Service: Gynecology;  Laterality: N/A;   WISDOM TOOTH EXTRACTION     Patient Active Problem List   Diagnosis Date Noted   Chronic right shoulder pain 01/25/2024   Body mass index 36.0-36.9, adult 08/16/2023   Exposure to sexually transmitted disease (STD) 07/08/2023   Flank pain 06/10/2023   Rotator cuff tear, left 01/18/2023   OA (osteoarthritis) of shoulder 12/03/2022   Peroneal tendon tear, left, subsequent encounter 01/17/2021   Recurrent herniation of lumbar disc 10/30/2020   Type 2 diabetes mellitus with obesity (HCC) 09/24/2020   Visit for routine gyn exam 11/29/2019   Encounter for screening mammogram for breast cancer 11/29/2019   History of bilateral tubal ligation 11/29/2019   S/P cesarean section 07/14/2016   Hypertension 07/05/2011   PCP: [name], FNP  REFERRING PROVIDER: Jones Broom, MD  REFERRING DIAG: M25.511 (ICD-10-CM) - Right shoulder pain   THERAPY DIAG:  Chronic right shoulder pain  Muscle weakness (generalized)  Rationale for Evaluation and Treatment: Rehabilitation  ONSET DATE: Chronic, ~October of 2024   SUBJECTIVE:  SUBJECTIVE STATEMENT: Patient reports she saw the doctor and they ordered an MRI which is Thursday 03/02/2024. She reports her shoulder is feeling about the same. She has been consistent with her HEP.   Eval: Patient comes in with c/c of right shoulder pain that is constant. It wakes her up at night and is affecting her ability to work. Overhead motions are the most painful to her and repetitive motions hurt a lot. Tender to the touch in all areas including bicipital groove. Recent cortisone shot  worked for two weeks but then stopped. Patient is still using the lidocaine patches that don't help much aside for providing slight pain relief. Patient reports she is only sleeping about 5 hours per night because of shoulder pain. Numbness and tingling to the elbow almost always.   Hand dominance: Right  PERTINENT HISTORY: Partial RTC repair on left shoulder 2024  PAIN:  Are you having pain? Yes:  NPRS scale: 10/10 at worst, 7/10 current Pain location: Right shoulder in all areas Pain description: Sharp, burning Aggravating factors: movement overhead, to the side, repetitive motions  Relieving factors: ice  PRECAUTIONS: None  RED FLAGS: None   WEIGHT BEARING RESTRICTIONS: No  FALLS:  Has patient fallen in last 6 months? No  LIVING ENVIRONMENT: Lives with: lives with their family Lives in: House/apartment No difficulty with ambulation at home  OCCUPATION: Custodian   PLOF: Independent  PATIENT GOALS: Return to ADLs and work without pain, sleep through the night.   OBJECTIVE:  Note: Objective measures were completed at Evaluation unless otherwise noted. PATIENT SURVEYS:  Neldon Mc 37/50  02/25/2023: 47/50   POSTURE: Forward posture, rounded shoulders.   UPPER EXTREMITY ROM:   Active ROM Right eval Left eval Right 02/21/2024  Shoulder flexion 75 145 120 AAROM  Shoulder extension     Shoulder abduction 100 165   Shoulder adduction     Shoulder internal rotation L4    Shoulder external rotation 30 65   Elbow flexion     Elbow extension     Wrist flexion     Wrist extension     Wrist ulnar deviation     Wrist radial deviation     Wrist pronation     Wrist supination     (Blank rows = not tested)  UPPER EXTREMITY MMT:  MMT Right eval Left eval  Shoulder flexion 3 5  Shoulder extension    Shoulder abduction 3 4  Shoulder adduction    Shoulder internal rotation 3 5  Shoulder external rotation 3 5  Middle trapezius 3   Lower trapezius 3   Elbow  flexion 4-   Elbow extension    Wrist flexion    Wrist extension    Wrist ulnar deviation    Wrist radial deviation    Wrist pronation    Wrist supination    Grip strength (lbs) 0 lbs 60 lbs  (Blank rows = not tested)  SHOULDER SPECIAL TESTS: Perform ULTT when patient can tolerate ROM   JOINT MOBILITY TESTING:  Anterior, inferior, and posterior glides all painful with no relief   PALPATION:  Tenderness to anterior, posterior, and lateral shoulder on immediate palpation  TREATMENT DATE:  [facility] Adult PT Treatment:                                                DATE: 02/25/2024 Right GHJ mobs to tolerance primarily inferior and posterior direction, distractive mobs with gentle oscillations to reduce pain and guarding PROM right shoulder all directions to tolerance Supine dowel chest press 2 x 10  Supine dowel shoulder flexion 2 x 10 Seated table slides with FR for shoulder flexion 2 x 10 Standing wall slide shoulder flexion 3 x 6 Standing shoulder isometrics ER, IR, flexion, extension, abduction 10 x 10 sec  PATIENT EDUCATION: Education details: HEP update Person educated: Patient Education method: Programmer, multimedia, Demonstration, Handout Education comprehension: verbalized understanding and returned demonstration  HOME EXERCISE PROGRAM: Access Code: [email or web address]    ASSESSMENT: CLINICAL IMPRESSION: Patient continues to report persistent pain of the right shoulder that limits progress in therapy, no adverse effects reported. Therapy continued to focus on improving shoulder mobility and active motion, and progressing isometrics with addition to her HEP. She was able to complete all isometric exercises but did report burning sensation of the shoulder that would subside once she finished the exercises. Updated her HEP to progress shoulder motion and strengthening for home. She does report worsening of sympotms on QuickDASH this visit.  Patient would benefit from continued skilled PT to continue to increase shoulder mobility and strength to reduce pain and maximize functional ability.    EVAL: Patient is a 49 y.o. female who was seen today for physical therapy evaluation and treatment for c/c of chronic right shoulder pain. Patient is limited in ROM on the right side, PROM > AROM but limited due to muscle guarding and pain. ROM limitations most prominent in flexion, abduction, and ER. Patient has strength deficits on right side, with all right UE scoring a 3/5 and unable to resist against gravity.  Grip strength on the right UE is 0 lbs. Patient exhibits high pain levels that increase with all motions. Tenderness to palpation of anterior, posterior, and superior shoulder region, deltoid tendon, and all trap regions. Patient scored a 37/50 on the QuickDASH indicating high impact on function and ability to perform ADLs. Overall, patient would benefit from skilled PT in order to increase ROM and strength in order to decrease pain levels.   OBJECTIVE IMPAIRMENTS: decreased ROM, decreased strength, postural dysfunction, and pain.   ACTIVITY LIMITATIONS: carrying, lifting, sleeping, dressing, and reach over head  PARTICIPATION LIMITATIONS: community activity and occupation  PERSONAL FACTORS: Time since onset of injury/illness/exacerbation are also affecting patient's functional outcome.    GOALS: Goals reviewed with patient? Yes  SHORT TERM GOALS: Target date: 02/22/2024  Patient will be I with initial HEP in order to progress with therapy. Baseline: Goal status: MET  2.  Patient will score </= 7/10 at worse on the NPS by 3rd visit in order to show an improvement in pain allowing for an increase in functional ability.  Baseline: 10/10 02/17/2024: 7/10 Goal status: MET  LONG TERM GOALS: Target date: 03/21/2024  Patient will be independent with final HEP in order to continue treatment at home for pain tolerance.  Baseline:  Goal  status: INITIAL  2.  Patient will score </= 5/10 at worse on the NPS to show an improvement in pain allowing for an increase in functional ability.  Baseline: 10/10 Goal status:  INITIAL  3.  Patient will increase ROM by at least 30 degrees in shoulder flexion, abduction, and ER to increase functional ability.  Baseline: see above Goal status: INITIAL  4.  Patient will increase strength to a 4/5 to show an increase in functional activity  Baseline: see above Goal status: INITIAL  5.  Patient will score at least a 27/50 on the QuickDASH to show an improvement in functional levels to return to ADLs.  Baseline: 37/50 02/25/2023: 47/50 Goal status: ONGOING   PLAN: PT FREQUENCY: 2x/week  PT DURATION: 8 weeks  PLANNED INTERVENTIONS: 97164- PT Re-evaluation, 97110-Therapeutic exercises, 97530- Therapeutic activity, 97112- Neuromuscular re-education, 97535- Self Care, 16109- Manual therapy, Patient/Family education, Dry Needling, Joint mobilization, Joint manipulation, Spinal manipulation, Spinal mobilization, Cryotherapy, and Moist heat  PLAN FOR NEXT SESSION: Review/Revise HEP. Continue working on pain management. Increase ROM specifically flexion, abduction, and ER. Periscapular strengthening. Grip strength.    Rosana Hoes, PT, DPT, LAT, ATC 02/25/24  8:43 AM Phone: [phone] Fax: [phone]

## 2024-03-03 ENCOUNTER — Ambulatory Visit: Admitting: Physical Therapy

## 2024-03-03 ENCOUNTER — Encounter: Payer: Self-pay | Admitting: Physical Therapy

## 2024-03-03 ENCOUNTER — Other Ambulatory Visit: Payer: Self-pay

## 2024-03-03 DIAGNOSIS — G8929 Other chronic pain: Secondary | ICD-10-CM

## 2024-03-03 DIAGNOSIS — M6281 Muscle weakness (generalized): Secondary | ICD-10-CM

## 2024-03-03 DIAGNOSIS — M25511 Pain in right shoulder: Secondary | ICD-10-CM | POA: Diagnosis not present

## 2024-03-03 NOTE — Therapy (Signed)
 OUTPATIENT PHYSICAL THERAPY TREATMENT   Patient Name: Meredith York MRN: [id number] DOB:[birth year], 48 y.o., female Today's Date: 03/03/2024   END OF SESSION:  PT End of Session - 03/03/24 0809     Visit Number 8    Number of Visits 17    Date for PT Re-Evaluation 03/21/24    Authorization Type [employer]    Authorization - Number of Visits 27    PT Start Time 0804    PT Stop Time 0843    PT Time Calculation (min) 39 min    Activity Tolerance Patient tolerated treatment well    Behavior During Therapy [facility] for tasks assessed/performed               Past Medical History:  Diagnosis Date   Carpal tunnel syndrome on right    Chlamydia    Gestational diabetes mellitus    Hypertension    no medicaqtions at this time   Migraines    Ovarian cyst    S/P lumbar discectomy 04/10/2015   Tendonitis    right wrist   Trichomonas    Type 2 diabetes mellitus (HCC)    not currently taking medication   Past Surgical History:  Procedure Laterality Date   ABDOMINAL HYSTERECTOMY     BACK SURGERY     CESAREAN SECTION  12/27/2011   Procedure: CESAREAN SECTION;  Surgeon: Brock Bad, MD;  Location: [facility];  Service: Gynecology;  Laterality: N/A;  Primary cesarean section with delivery of baby girl at 14. Apgars 3/3/6   CESAREAN SECTION N/A 08/03/2016   Procedure: CESAREAN SECTION;  Surgeon: Tereso Newcomer, MD;  Location: [facility];  Service: Obstetrics;  Laterality: N/A;   CHOLECYSTECTOMY  02/2009   COLONOSCOPY  07/30/2023   Tiajuana Amass at [facility]   DILATION AND CURETTAGE OF UTERUS N/A 06/25/2014   Procedure: DILATATION AND CURETTAGE;  Surgeon: Reva Bores, MD;  Location: [facility];  Service: Gynecology;  Laterality: N/A;   LUMBAR LAMINECTOMY/DECOMPRESSION MICRODISCECTOMY Left 04/10/2015   Procedure: Left L5-S1 Microdiscectomy;  Surgeon: Eldred Manges, MD;  Location: [facility] OR;  Service: Orthopedics;  Laterality: Left;   Rotator cuff partial repair Left 04/06/2023    VAGINAL HYSTERECTOMY N/A 06/04/2020   Procedure: HYSTERECTOMY VAGINAL, MORCELLATION;  Surgeon: Hermina Staggers, MD;  Location: [facility];  Service: Gynecology;  Laterality: N/A;   WISDOM TOOTH EXTRACTION     Patient Active Problem List   Diagnosis Date Noted   Chronic right shoulder pain 01/25/2024   Body mass index 36.0-36.9, adult 08/16/2023   Exposure to sexually transmitted disease (STD) 07/08/2023   Flank pain 06/10/2023   Rotator cuff tear, left 01/18/2023   OA (osteoarthritis) of shoulder 12/03/2022   Peroneal tendon tear, left, subsequent encounter 01/17/2021   Recurrent herniation of lumbar disc 10/30/2020   Type 2 diabetes mellitus with obesity (HCC) 09/24/2020   Visit for routine gyn exam 11/29/2019   Encounter for screening mammogram for breast cancer 11/29/2019   History of bilateral tubal ligation 11/29/2019   S/P cesarean section 07/14/2016   Hypertension 07/05/2011   PCP: [name], FNP  REFERRING PROVIDER: Jones Broom, MD  REFERRING DIAG: M25.511 (ICD-10-CM) - Right shoulder pain   THERAPY DIAG:  Chronic right shoulder pain  Muscle weakness (generalized)  Rationale for Evaluation and Treatment: Rehabilitation  ONSET DATE: Chronic, ~October of 2024   SUBJECTIVE:  SUBJECTIVE STATEMENT: Patient reports she did not get an MRI due to insurance so she is waiting to reschedule. Shoulder is feeling about the same. She is consistent with her HEP, states she tries to challenge herself at home.   Eval: Patient comes in with c/c of right shoulder pain that is constant. It wakes her up at night and is affecting her ability to work. Overhead motions are the most painful to her and repetitive motions hurt a lot. Tender to the touch in all areas including bicipital  groove. Recent cortisone shot worked for two weeks but then stopped. Patient is still using the lidocaine patches that don't help much aside for providing slight pain relief. Patient reports she is only sleeping about 5 hours per night because of shoulder pain. Numbness and tingling to the elbow almost always.   Hand dominance: Right  PERTINENT HISTORY: Partial RTC repair on left shoulder 2024  PAIN:  Are you having pain? Yes:  NPRS scale: 10/10 at worst, 7/10 current Pain location: Right shoulder in all areas Pain description: Sharp, burning Aggravating factors: movement overhead, to the side, repetitive motions  Relieving factors: ice  PRECAUTIONS: None  RED FLAGS: None   WEIGHT BEARING RESTRICTIONS: No  FALLS:  Has patient fallen in last 6 months? No  LIVING ENVIRONMENT: Lives with: lives with their family Lives in: House/apartment No difficulty with ambulation at home  OCCUPATION: Custodian   PLOF: Independent  PATIENT GOALS: Return to ADLs and work without pain, sleep through the night.   OBJECTIVE:  Note: Objective measures were completed at Evaluation unless otherwise noted. PATIENT SURVEYS:  Neldon Mc 37/50  02/25/2023: 38/50   POSTURE: Forward posture, rounded shoulders.   UPPER EXTREMITY ROM:   Active ROM Right eval Left eval Right 02/21/2024 Right 03/03/2024  Shoulder flexion 75 145 120 AAROM 120 AROM  Shoulder extension      Shoulder abduction 100 165    Shoulder adduction      Shoulder internal rotation L4     Shoulder external rotation 30 65    Elbow flexion      Elbow extension      Wrist flexion      Wrist extension      Wrist ulnar deviation      Wrist radial deviation      Wrist pronation      Wrist supination      (Blank rows = not tested)  UPPER EXTREMITY MMT:  MMT Right eval Left eval  Shoulder flexion 3 5  Shoulder extension    Shoulder abduction 3 4  Shoulder adduction    Shoulder internal rotation 3 5  Shoulder  external rotation 3 5  Middle trapezius 3   Lower trapezius 3   Elbow flexion 4-   Elbow extension    Wrist flexion    Wrist extension    Wrist ulnar deviation    Wrist radial deviation    Wrist pronation    Wrist supination    Grip strength (lbs) 0 lbs 60 lbs  (Blank rows = not tested)  SHOULDER SPECIAL TESTS: Perform ULTT when patient can tolerate ROM   JOINT MOBILITY TESTING:  Anterior, inferior, and posterior glides all painful with no relief   PALPATION:  Tenderness to anterior, posterior, and lateral shoulder on immediate palpation  TREATMENT DATE:  [facility] Adult PT Treatment:                                                DATE: 03/03/2024 Right GHJ mobs to tolerance primarily inferior and posterior direction, distractive mobs with gentle oscillations to reduce pain and guarding PROM right shoulder all directions to tolerance Supine dowel press x 10  Supine dowel press with overhead reach x 10 Supine dowel shoulder flexion 2 x 10 Standing wall slide shoulder flexion 3 x 6 Row with green 3 x 10 Standing shoulder isometrics ER, IR, flexion, extension, abduction 10 x 10 sec Dynamic step-out ER isometric with yellow 2 x 10  PATIENT EDUCATION: Education details: HEP Person educated: Patient Education method: Programmer, multimedia, Demonstration Education comprehension: verbalized understanding and returned demonstration  HOME EXERCISE PROGRAM: Access Code: [email or web address]    ASSESSMENT: CLINICAL IMPRESSION: Patient continues to report persistent pain of the right shoulder that limits progress in therapy, no adverse effects reported. Therapy continues to focus on improve shoulder motion and strength to tolerance. She does exhibit an improvement in her right shoulder active elevation compared to evaluation and has been progressing with postural strengthening and rotator cuff isometrics. No changes made to her HEP this visit. Patient  would benefit from continued skilled PT to continue to increase shoulder mobility and strength to reduce pain and maximize functional ability.    EVAL: Patient is a 48 y.o. female who was seen today for physical therapy evaluation and treatment for c/c of chronic right shoulder pain. Patient is limited in ROM on the right side, PROM > AROM but limited due to muscle guarding and pain. ROM limitations most prominent in flexion, abduction, and ER. Patient has strength deficits on right side, with all right UE scoring a 3/5 and unable to resist against gravity.  Grip strength on the right UE is 0 lbs. Patient exhibits high pain levels that increase with all motions. Tenderness to palpation of anterior, posterior, and superior shoulder region, deltoid tendon, and all trap regions. Patient scored a 37/50 on the QuickDASH indicating high impact on function and ability to perform ADLs. Overall, patient would benefit from skilled PT in order to increase ROM and strength in order to decrease pain levels.   OBJECTIVE IMPAIRMENTS: decreased ROM, decreased strength, postural dysfunction, and pain.   ACTIVITY LIMITATIONS: carrying, lifting, sleeping, dressing, and reach over head  PARTICIPATION LIMITATIONS: community activity and occupation  PERSONAL FACTORS: Time since onset of injury/illness/exacerbation are also affecting patient's functional outcome.    GOALS: Goals reviewed with patient? Yes  SHORT TERM GOALS: Target date: 02/22/2024  Patient will be I with initial HEP in order to progress with therapy. Baseline: Goal status: MET  2.  Patient will score </= 7/10 at worse on the NPS by 3rd visit in order to show an improvement in pain allowing for an increase in functional ability.  Baseline: 10/10 02/17/2024: 7/10 Goal status: MET  LONG TERM GOALS: Target date: 03/21/2024  Patient will be independent with final HEP in order to continue treatment at home for pain tolerance.  Baseline:  Goal status:  INITIAL  2.  Patient will score </= 5/10 at worse on the NPS to show an improvement in pain allowing for an increase in functional ability.  Baseline: 10/10 Goal status: INITIAL  3.  Patient will increase ROM by at least  30 degrees in shoulder flexion, abduction, and ER to increase functional ability.  Baseline: see above Goal status: INITIAL  4.  Patient will increase strength to a 4/5 to show an increase in functional activity  Baseline: see above Goal status: INITIAL  5.  Patient will score at least a 27/50 on the QuickDASH to show an improvement in functional levels to return to ADLs.  Baseline: 37/50 02/25/2023: 38/50 Goal status: ONGOING   PLAN: PT FREQUENCY: 2x/week  PT DURATION: 8 weeks  PLANNED INTERVENTIONS: 97164- PT Re-evaluation, 97110-Therapeutic exercises, 97530- Therapeutic activity, 97112- Neuromuscular re-education, 97535- Self Care, 09811- Manual therapy, Patient/Family education, Dry Needling, Joint mobilization, Joint manipulation, Spinal manipulation, Spinal mobilization, Cryotherapy, and Moist heat  PLAN FOR NEXT SESSION: Review/Revise HEP. Continue working on pain management. Increase ROM specifically flexion, abduction, and ER. Periscapular strengthening. Grip strength.    Rosana Hoes, PT, DPT, LAT, ATC 03/03/24  8:46 AM Phone: [phone] Fax: [phone]

## 2024-03-09 NOTE — Therapy (Signed)
 OUTPATIENT PHYSICAL THERAPY TREATMENT   Patient Name: Meredith York MRN: [id number] DOB:01/16/1976, 48 y.o., female Today's Date: 03/10/2024   END OF SESSION:  PT End of Session - 03/10/24 0936     Visit Number 9    Number of Visits 17    Date for PT Re-Evaluation 03/21/24    Authorization Type [employer]    Authorization - Number of Visits 27    PT Start Time 0930    PT Stop Time 1010    PT Time Calculation (min) 40 min    Activity Tolerance Patient tolerated treatment well    Behavior During Therapy [place] for tasks assessed/performed                Past Medical History:  Diagnosis Date   Carpal tunnel syndrome on right    Chlamydia    Gestational diabetes mellitus    Hypertension    no medicaqtions at this time   Migraines    Ovarian cyst    S/P lumbar discectomy 04/10/2015   Tendonitis    right wrist   Trichomonas    Type 2 diabetes mellitus (HCC)    not currently taking medication   Past Surgical History:  Procedure Laterality Date   ABDOMINAL HYSTERECTOMY     BACK SURGERY     CESAREAN SECTION  12/27/2011   Procedure: CESAREAN SECTION;  Surgeon: Brock Bad, MD;  Location: [facility];  Service: Gynecology;  Laterality: N/A;  Primary cesarean section with delivery of baby girl at 21. Apgars 3/3/6   CESAREAN SECTION N/A 08/03/2016   Procedure: CESAREAN SECTION;  Surgeon: Tereso Newcomer, MD;  Location: [facility];  Service: Obstetrics;  Laterality: N/A;   CHOLECYSTECTOMY  02/2009   COLONOSCOPY  07/30/2023   Tiajuana Amass at [facility]   DILATION AND CURETTAGE OF UTERUS N/A 06/25/2014   Procedure: DILATATION AND CURETTAGE;  Surgeon: Reva Bores, MD;  Location: [facility];  Service: Gynecology;  Laterality: N/A;   LUMBAR LAMINECTOMY/DECOMPRESSION MICRODISCECTOMY Left 04/10/2015   Procedure: Left L5-S1 Microdiscectomy;  Surgeon: Eldred Manges, MD;  Location: [facility] OR;  Service: Orthopedics;  Laterality: Left;   Rotator cuff partial repair Left  04/06/2023   VAGINAL HYSTERECTOMY N/A 06/04/2020   Procedure: HYSTERECTOMY VAGINAL, MORCELLATION;  Surgeon: Hermina Staggers, MD;  Location: [facility];  Service: Gynecology;  Laterality: N/A;   WISDOM TOOTH EXTRACTION     Patient Active Problem List   Diagnosis Date Noted   Chronic right shoulder pain 01/25/2024   Body mass index 36.0-36.9, adult 08/16/2023   Exposure to sexually transmitted disease (STD) 07/08/2023   Flank pain 06/10/2023   Rotator cuff tear, left 01/18/2023   OA (osteoarthritis) of shoulder 12/03/2022   Peroneal tendon tear, left, subsequent encounter 01/17/2021   Recurrent herniation of lumbar disc 10/30/2020   Type 2 diabetes mellitus with obesity (HCC) 09/24/2020   Visit for routine gyn exam 11/29/2019   Encounter for screening mammogram for breast cancer 11/29/2019   History of bilateral tubal ligation 11/29/2019   S/P cesarean section 07/14/2016   Hypertension 07/05/2011   PCP: [name], FNP  REFERRING PROVIDER: Jones Broom, MD  REFERRING DIAG: M25.511 (ICD-10-CM) - Right shoulder pain   THERAPY DIAG:  Chronic right shoulder pain  Muscle weakness (generalized)  Rationale for Evaluation and Treatment: Rehabilitation  ONSET DATE: Chronic, ~October of 2024   SUBJECTIVE:  SUBJECTIVE STATEMENT: Patient reports she still has not gotten the MRI. She has been having a lot of soreness and still having trouble getting to sleep due to right shoulder pain. She is consistent with her exercises.    Eval: Patient comes in with c/c of right shoulder pain that is constant. It wakes her up at night and is affecting her ability to work. Overhead motions are the most painful to her and repetitive motions hurt a lot. Tender to the touch in all areas including bicipital  groove. Recent cortisone shot worked for two weeks but then stopped. Patient is still using the lidocaine patches that don't help much aside for providing slight pain relief. Patient reports she is only sleeping about 5 hours per night because of shoulder pain. Numbness and tingling to the elbow almost always.   Hand dominance: Right  PERTINENT HISTORY: Partial RTC repair on left shoulder 2024  PAIN:  Are you having pain? Yes:  NPRS scale: 10/10 at worst, 7-8/10 current Pain location: Right shoulder in all areas Pain description: Sharp, burning Aggravating factors: movement overhead, to the side, repetitive motions  Relieving factors: ice  PRECAUTIONS: None  RED FLAGS: None   WEIGHT BEARING RESTRICTIONS: No  FALLS:  Has patient fallen in last 6 months? No  LIVING ENVIRONMENT: Lives with: lives with their family Lives in: House/apartment No difficulty with ambulation at home  OCCUPATION: Custodian   PLOF: Independent  PATIENT GOALS: Return to ADLs and work without pain, sleep through the night.   OBJECTIVE:  Note: Objective measures were completed at Evaluation unless otherwise noted. PATIENT SURVEYS:  Neldon Mc 37/50  02/25/2023: 38/50   POSTURE: Forward posture, rounded shoulders.   UPPER EXTREMITY ROM:   Active ROM Right eval Left eval Right 02/21/2024 Right 03/03/2024 Right 03/10/2024  Shoulder flexion 75 145 120 AAROM 120 AROM 120 AROM  140 AAROM wall slide   Shoulder extension       Shoulder abduction 100 165     Shoulder adduction       Shoulder internal rotation L4      Shoulder external rotation 30 65     Elbow flexion       Elbow extension       Wrist flexion       Wrist extension       Wrist ulnar deviation       Wrist radial deviation       Wrist pronation       Wrist supination       (Blank rows = not tested)  UPPER EXTREMITY MMT:  MMT Right eval Left eval  Shoulder flexion 3 5  Shoulder extension    Shoulder abduction 3 4   Shoulder adduction    Shoulder internal rotation 3 5  Shoulder external rotation 3 5  Middle trapezius 3   Lower trapezius 3   Elbow flexion 4-   Elbow extension    Wrist flexion    Wrist extension    Wrist ulnar deviation    Wrist radial deviation    Wrist pronation    Wrist supination    Grip strength (lbs) 0 lbs 60 lbs  (Blank rows = not tested)  SHOULDER SPECIAL TESTS: Perform ULTT when patient can tolerate ROM   JOINT MOBILITY TESTING:  Anterior, inferior, and posterior glides all painful with no relief   PALPATION:  Tenderness to anterior, posterior, and lateral shoulder on immediate palpation  TREATMENT DATE:  [facility] Adult PT Treatment:                                                DATE: 03/10/2024 Right GHJ mobs to tolerance primarily inferior and posterior direction, distractive mobs with gentle oscillations to reduce pain and guarding PROM right shoulder all directions to tolerance Supine dowel press 2 x 10  Supine dowel press with overhead reach x 10 Supine dowel shoulder flexion x 10 Sidelying shoulder abduction 2 x 10 Sidelying shoulder ER 2 x 10 Seated overhead pulley x 2 min Standing wall slide shoulder flexion 3 x 5 Row with green 3 x 10  PATIENT EDUCATION: Education details: HEP Person educated: Patient Education method: Programmer, multimedia, Insurance underwriter comprehension: verbalized understanding and returned demonstration  HOME EXERCISE PROGRAM: Access Code: [email or web address]    ASSESSMENT: CLINICAL IMPRESSION: Patient continues to report persistent pain of the right shoulder that limits progress in therapy, no adverse effects reported. She did not tolerate manual to the right shoulder well so primarily utilized grade I-II mobs for pain and guarding reduction. Therapy continues to focus on progressing right shoulder AAROM->AROM as tolerated. Incorporated some sidelying rotator cuff exercises this  visit. She continues to exhibit well maintained shoulder flexion AAROM with her wall slides and has remained at 120 deg active shoulder flexion. Her primarily limitation at this time remains right shoulder pain. No changes made to her HEP this visit. Patient would benefit from continued skilled PT to continue to increase shoulder mobility and strength to reduce pain and maximize functional ability.    EVAL: Patient is a 48 y.o. female who was seen today for physical therapy evaluation and treatment for c/c of chronic right shoulder pain. Patient is limited in ROM on the right side, PROM > AROM but limited due to muscle guarding and pain. ROM limitations most prominent in flexion, abduction, and ER. Patient has strength deficits on right side, with all right UE scoring a 3/5 and unable to resist against gravity.  Grip strength on the right UE is 0 lbs. Patient exhibits high pain levels that increase with all motions. Tenderness to palpation of anterior, posterior, and superior shoulder region, deltoid tendon, and all trap regions. Patient scored a 37/50 on the QuickDASH indicating high impact on function and ability to perform ADLs. Overall, patient would benefit from skilled PT in order to increase ROM and strength in order to decrease pain levels.   OBJECTIVE IMPAIRMENTS: decreased ROM, decreased strength, postural dysfunction, and pain.   ACTIVITY LIMITATIONS: carrying, lifting, sleeping, dressing, and reach over head  PARTICIPATION LIMITATIONS: community activity and occupation  PERSONAL FACTORS: Time since onset of injury/illness/exacerbation are also affecting patient's functional outcome.    GOALS: Goals reviewed with patient? Yes  SHORT TERM GOALS: Target date: 02/22/2024  Patient will be I with initial HEP in order to progress with therapy. Baseline: Goal status: MET  2.  Patient will score </= 7/10 at worse on the NPS by 3rd visit in order to show an improvement in pain allowing for an  increase in functional ability.  Baseline: 10/10 02/17/2024: 7/10 Goal status: MET  LONG TERM GOALS: Target date: 03/21/2024  Patient will be independent with final HEP in order to continue treatment at home for pain tolerance.  Baseline:  Goal status: INITIAL  2.  Patient will score </= 5/10 at worse  on the NPS to show an improvement in pain allowing for an increase in functional ability.  Baseline: 10/10 Goal status: INITIAL  3.  Patient will increase ROM by at least 30 degrees in shoulder flexion, abduction, and ER to increase functional ability.  Baseline: see above Goal status: INITIAL  4.  Patient will increase strength to a 4/5 to show an increase in functional activity  Baseline: see above Goal status: INITIAL  5.  Patient will score at least a 27/50 on the QuickDASH to show an improvement in functional levels to return to ADLs.  Baseline: 37/50 02/25/2023: 38/50 Goal status: ONGOING   PLAN: PT FREQUENCY: 2x/week  PT DURATION: 8 weeks  PLANNED INTERVENTIONS: 97164- PT Re-evaluation, 97110-Therapeutic exercises, 97530- Therapeutic activity, 97112- Neuromuscular re-education, 97535- Self Care, 56213- Manual therapy, Patient/Family education, Dry Needling, Joint mobilization, Joint manipulation, Spinal manipulation, Spinal mobilization, Cryotherapy, and Moist heat  PLAN FOR NEXT SESSION: Review/Revise HEP. Continue working on pain management. Increase ROM specifically flexion, abduction, and ER. Periscapular strengthening. Grip strength.    Rosana Hoes, PT, DPT, LAT, ATC 03/10/24  10:13 AM Phone: [phone] Fax: [phone]

## 2024-03-10 ENCOUNTER — Encounter: Payer: Self-pay | Admitting: Physical Therapy

## 2024-03-10 ENCOUNTER — Ambulatory Visit: Admitting: Physical Therapy

## 2024-03-10 ENCOUNTER — Other Ambulatory Visit: Payer: Self-pay

## 2024-03-10 DIAGNOSIS — M25511 Pain in right shoulder: Secondary | ICD-10-CM | POA: Diagnosis not present

## 2024-03-10 DIAGNOSIS — M6281 Muscle weakness (generalized): Secondary | ICD-10-CM

## 2024-03-10 DIAGNOSIS — G8929 Other chronic pain: Secondary | ICD-10-CM

## 2024-03-17 ENCOUNTER — Ambulatory Visit: Admitting: Physical Therapy

## 2024-03-17 ENCOUNTER — Encounter: Payer: Self-pay | Admitting: Physical Therapy

## 2024-03-17 DIAGNOSIS — M6281 Muscle weakness (generalized): Secondary | ICD-10-CM

## 2024-03-17 DIAGNOSIS — M25511 Pain in right shoulder: Secondary | ICD-10-CM | POA: Diagnosis not present

## 2024-03-17 DIAGNOSIS — G8929 Other chronic pain: Secondary | ICD-10-CM

## 2024-03-17 NOTE — Therapy (Signed)
 OUTPATIENT PHYSICAL THERAPY TREATMENT   Patient Name: Meredith York MRN: [id number] DOB:[birth year], 48 y.o., female Today's Date: 03/17/2024   END OF SESSION:  PT End of Session - 03/17/24 0934     Visit Number 10    Number of Visits 17    Date for PT Re-Evaluation 03/21/24    Authorization Type [employer]    Authorization - Number of Visits 27    PT Start Time 0933    PT Stop Time 1015    PT Time Calculation (min) 42 min                Past Medical History:  Diagnosis Date   Carpal tunnel syndrome on right    Chlamydia    Gestational diabetes mellitus    Hypertension    no medicaqtions at this time   Migraines    Ovarian cyst    S/P lumbar discectomy 04/10/2015   Tendonitis    right wrist   Trichomonas    Type 2 diabetes mellitus (HCC)    not currently taking medication   Past Surgical History:  Procedure Laterality Date   ABDOMINAL HYSTERECTOMY     BACK SURGERY     CESAREAN SECTION  12/27/2011   Procedure: CESAREAN SECTION;  Surgeon: Brock Bad, MD;  Location: [facility];  Service: Gynecology;  Laterality: N/A;  Primary cesarean section with delivery of baby girl at 69. Apgars 3/3/6   CESAREAN SECTION N/A 08/03/2016   Procedure: CESAREAN SECTION;  Surgeon: Tereso Newcomer, MD;  Location: [facility];  Service: Obstetrics;  Laterality: N/A;   CHOLECYSTECTOMY  02/2009   COLONOSCOPY  07/30/2023   Tiajuana Amass at [facility]   DILATION AND CURETTAGE OF UTERUS N/A 06/25/2014   Procedure: DILATATION AND CURETTAGE;  Surgeon: Reva Bores, MD;  Location: [facility];  Service: Gynecology;  Laterality: N/A;   LUMBAR LAMINECTOMY/DECOMPRESSION MICRODISCECTOMY Left 04/10/2015   Procedure: Left L5-S1 Microdiscectomy;  Surgeon: Eldred Manges, MD;  Location: [facility] OR;  Service: Orthopedics;  Laterality: Left;   Rotator cuff partial repair Left 04/06/2023   VAGINAL HYSTERECTOMY N/A 06/04/2020   Procedure: HYSTERECTOMY VAGINAL, MORCELLATION;  Surgeon: Hermina Staggers, MD;  Location: [facility];  Service: Gynecology;  Laterality: N/A;   WISDOM TOOTH EXTRACTION     Patient Active Problem List   Diagnosis Date Noted   Chronic right shoulder pain 01/25/2024   Body mass index 36.0-36.9, adult 08/16/2023   Exposure to sexually transmitted disease (STD) 07/08/2023   Flank pain 06/10/2023   Rotator cuff tear, left 01/18/2023   OA (osteoarthritis) of shoulder 12/03/2022   Peroneal tendon tear, left, subsequent encounter 01/17/2021   Recurrent herniation of lumbar disc 10/30/2020   Type 2 diabetes mellitus with obesity (HCC) 09/24/2020   Visit for routine gyn exam 11/29/2019   Encounter for screening mammogram for breast cancer 11/29/2019   History of bilateral tubal ligation 11/29/2019   S/P cesarean section 07/14/2016   Hypertension 07/05/2011   PCP: [name], FNP  REFERRING PROVIDER: Jones Broom, MD  REFERRING DIAG: M25.511 (ICD-10-CM) - Right shoulder pain   THERAPY DIAG:  Chronic right shoulder pain  Muscle weakness (generalized)  Rationale for Evaluation and Treatment: Rehabilitation  ONSET DATE: Chronic, ~October of 2024   SUBJECTIVE:  SUBJECTIVE STATEMENT: Patient reports she still has not gotten the MRI. She has been having a lot of soreness and still having trouble getting to sleep due to right shoulder pain. She is consistent with her exercises.    Eval: Patient comes in with c/c of right shoulder pain that is constant. It wakes her up at night and is affecting her ability to work. Overhead motions are the most painful to her and repetitive motions hurt a lot. Tender to the touch in all areas including bicipital groove. Recent cortisone shot worked for two weeks but then stopped. Patient is still using the lidocaine patches that don't  help much aside for providing slight pain relief. Patient reports she is only sleeping about 5 hours per night because of shoulder pain. Numbness and tingling to the elbow almost always.   Hand dominance: Right  PERTINENT HISTORY: Partial RTC repair on left shoulder 2024  PAIN:  Are you having pain? Yes:  NPRS scale: 10/10 at worst, 7-8/10 current Pain location: Right shoulder in all areas Pain description: Sharp, burning Aggravating factors: movement overhead, to the side, repetitive motions  Relieving factors: ice  PRECAUTIONS: None  RED FLAGS: None   WEIGHT BEARING RESTRICTIONS: No  FALLS:  Has patient fallen in last 6 months? No  LIVING ENVIRONMENT: Lives with: lives with their family Lives in: House/apartment No difficulty with ambulation at home  OCCUPATION: Custodian   PLOF: Independent  PATIENT GOALS: Return to ADLs and work without pain, sleep through the night.   OBJECTIVE:  Note: Objective measures were completed at Evaluation unless otherwise noted. PATIENT SURVEYS:  Neldon Mc 37/50  02/25/2023: 38/50   POSTURE: Forward posture, rounded shoulders.   UPPER EXTREMITY ROM:   Active ROM Right eval Left eval Right 02/21/2024 Right 03/03/2024 Right 03/10/2024 Right 03/17/24  Shoulder flexion 75 145 120 AAROM 120 AROM 120 AROM  140 AAROM wall slide  115  Shoulder extension        Shoulder abduction 100 165    82  Shoulder adduction        Shoulder internal rotation L4       Shoulder external rotation 30 65      Elbow flexion        Elbow extension        Wrist flexion        Wrist extension        Wrist ulnar deviation        Wrist radial deviation        Wrist pronation        Wrist supination        (Blank rows = not tested)  UPPER EXTREMITY MMT:  MMT Right eval Left eval Right 03/17/24  Shoulder flexion 3 5   Shoulder extension     Shoulder abduction 3 4   Shoulder adduction     Shoulder internal rotation 3 5   Shoulder  external rotation 3 5   Middle trapezius 3    Lower trapezius 3    Elbow flexion 4-    Elbow extension     Wrist flexion     Wrist extension     Wrist ulnar deviation     Wrist radial deviation     Wrist pronation     Wrist supination     Grip strength (lbs) 0 lbs 60 lbs 26 lb  (Blank rows = not tested)  SHOULDER SPECIAL TESTS: Perform ULTT when patient can tolerate ROM   JOINT MOBILITY TESTING:  Anterior, inferior, and posterior glides all painful with no relief   PALPATION:  Tenderness to anterior, posterior, and lateral shoulder on immediate palpation                                                             TREATMENT DATE:  [facility] Adult PT Treatment:                                                DATE: 03/17/24 OH pulleys AA AA Shoulder wall slide flexion x 10  AA Shoulder wall slide abdct x 10 Row GTB x 15 Pulldown GTB x 15 Standing ER YTB x 10 Standing IR YTB x 10  Supine chest press dowel 3#  Supine chest press to pullover x 5 3# on dowel  Supine red band horiz  Supine PNF D2 flexion AROM Right  Updated HEP  [facility] Adult PT Treatment:                                                DATE: 03/10/2024 Right GHJ mobs to tolerance primarily inferior and posterior direction, distractive mobs with gentle oscillations to reduce pain and guarding PROM right shoulder all directions to tolerance Supine dowel press 2 x 10  Supine dowel press with overhead reach x 10 Supine dowel shoulder flexion x 10 Sidelying shoulder abduction 2 x 10 Sidelying shoulder ER 2 x 10 Seated overhead pulley x 2 min Standing wall slide shoulder flexion 3 x 5 Row with green 3 x 10  PATIENT EDUCATION: Education details: HEP Person educated: Patient Education method: Programmer, multimedia, Demonstration Education comprehension: verbalized understanding and returned demonstration  HOME EXERCISE PROGRAM: Access Code: [email or web address]    ASSESSMENT: CLINICAL IMPRESSION: Pt arrives reporting soreness in  shoulder. Going today for MRI. Was able to progress to resistance band RTC strengthening and this was added to HEP. For OH movements she requires Active assistance in gravity resisted positions. Therapy continues to focus on progressing right shoulder AAROM->AROM as tolerated. Grip strength has improved. Her primarily limitation at this time remains right shoulder pain. No changes made to her HEP this visit. Patient would benefit from continued skilled PT to continue to increase shoulder mobility and strength to reduce pain and maximize functional ability.    EVAL: Patient is a 49 y.o. female who was seen today for physical therapy evaluation and treatment for c/c of chronic right shoulder pain. Patient is limited in ROM on the right side, PROM > AROM but limited due to muscle guarding and pain. ROM limitations most prominent in flexion, abduction, and ER. Patient has strength deficits on right side, with all right UE scoring a 3/5 and unable to resist against gravity.  Grip strength on the right UE is 0 lbs. Patient exhibits high pain levels that increase with all motions. Tenderness to palpation of anterior, posterior, and superior shoulder region, deltoid tendon, and all trap regions. Patient scored a 37/50 on the QuickDASH indicating high impact on function and ability to perform ADLs. Overall, patient would benefit  from skilled PT in order to increase ROM and strength in order to decrease pain levels.   OBJECTIVE IMPAIRMENTS: decreased ROM, decreased strength, postural dysfunction, and pain.   ACTIVITY LIMITATIONS: carrying, lifting, sleeping, dressing, and reach over head  PARTICIPATION LIMITATIONS: community activity and occupation  PERSONAL FACTORS: Time since onset of injury/illness/exacerbation are also affecting patient's functional outcome.    GOALS: Goals reviewed with patient? Yes  SHORT TERM GOALS: Target date: 02/22/2024  Patient will be I with initial HEP in order to progress with  therapy. Baseline: Goal status: MET  2.  Patient will score </= 7/10 at worse on the NPS by 3rd visit in order to show an improvement in pain allowing for an increase in functional ability.  Baseline: 10/10 02/17/2024: 7/10 Goal status: MET  LONG TERM GOALS: Target date: 03/21/2024  Patient will be independent with final HEP in order to continue treatment at home for pain tolerance.  Baseline:  Goal status: INITIAL  2.  Patient will score </= 5/10 at worse on the NPS to show an improvement in pain allowing for an increase in functional ability.  Baseline: 10/10 Goal status: INITIAL  3.  Patient will increase ROM by at least 30 degrees in shoulder flexion, abduction, and ER to increase functional ability.  Baseline: see above Goal status: INITIAL  4.  Patient will increase strength to a 4/5 to show an increase in functional activity  Baseline: see above Goal status: INITIAL  5.  Patient will score at least a 27/50 on the QuickDASH to show an improvement in functional levels to return to ADLs.  Baseline: 37/50 02/25/2023: 38/50 Goal status: ONGOING   PLAN: PT FREQUENCY: 2x/week  PT DURATION: 8 weeks  PLANNED INTERVENTIONS: 97164- PT Re-evaluation, 97110-Therapeutic exercises, 97530- Therapeutic activity, 97112- Neuromuscular re-education, 97535- Self Care, 16109- Manual therapy, Patient/Family education, Dry Needling, Joint mobilization, Joint manipulation, Spinal manipulation, Spinal mobilization, Cryotherapy, and Moist heat  PLAN FOR NEXT SESSION: Review/Revise HEP. Continue working on pain management. Increase ROM specifically flexion, abduction, and ER. Periscapular strengthening. Grip strength.    Jannette Spanner, PTA 03/17/24 10:43 AM Phone: [phone] Fax: [phone]

## 2024-03-24 ENCOUNTER — Encounter: Payer: Self-pay | Admitting: Physical Therapy

## 2024-03-24 ENCOUNTER — Ambulatory Visit: Attending: Nurse Practitioner | Admitting: Physical Therapy

## 2024-03-24 DIAGNOSIS — M25511 Pain in right shoulder: Secondary | ICD-10-CM | POA: Diagnosis present

## 2024-03-24 DIAGNOSIS — G8929 Other chronic pain: Secondary | ICD-10-CM | POA: Diagnosis present

## 2024-03-24 NOTE — Therapy (Addendum)
 OUTPATIENT PHYSICAL THERAPY TREATMENT  DISCHARGE   Patient Name: Meredith York MRN: [id number] DOB:[birth year], 48 y.o., female Today's Date: 03/24/2024   END OF SESSION:  PT End of Session - 03/24/24 0936     Visit Number 11    Number of Visits 17    Date for PT Re-Evaluation 03/21/24    Authorization Type [employer]    Authorization - Number of Visits 27    PT Start Time 0932    PT Stop Time 1010    PT Time Calculation (min) 38 min                 Past Medical History:  Diagnosis Date   Carpal tunnel syndrome on right    Chlamydia    Gestational diabetes mellitus    Hypertension    no medicaqtions at this time   Migraines    Ovarian cyst    S/P lumbar discectomy 04/10/2015   Tendonitis    right wrist   Trichomonas    Type 2 diabetes mellitus (HCC)    not currently taking medication   Past Surgical History:  Procedure Laterality Date   ABDOMINAL HYSTERECTOMY     BACK SURGERY     CESAREAN SECTION  12/27/2011   Procedure: CESAREAN SECTION;  Surgeon: Gabrielle Joiner, MD;  Location: [facility];  Service: Gynecology;  Laterality: N/A;  Primary cesarean section with delivery of baby girl at 43. Apgars 3/3/6   CESAREAN SECTION N/A 08/03/2016   Procedure: CESAREAN SECTION;  Surgeon: Julianne Octave, MD;  Location: [facility];  Service: Obstetrics;  Laterality: N/A;   CHOLECYSTECTOMY  02/2009   COLONOSCOPY  07/30/2023   [facility] at [facility]   DILATION AND CURETTAGE OF UTERUS N/A 06/25/2014   Procedure: DILATATION AND CURETTAGE;  Surgeon: Granville Layer, MD;  Location: [facility];  Service: Gynecology;  Laterality: N/A;   LUMBAR LAMINECTOMY/DECOMPRESSION MICRODISCECTOMY Left 04/10/2015   Procedure: Left L5-S1 Microdiscectomy;  Surgeon: Adah Acron, MD;  Location: [facility] OR;  Service: Orthopedics;  Laterality: Left;   Rotator cuff partial repair Left 04/06/2023   VAGINAL HYSTERECTOMY N/A 06/04/2020   Procedure: HYSTERECTOMY VAGINAL, MORCELLATION;  Surgeon:  Othelia Blinks, MD;  Location: [place];  Service: Gynecology;  Laterality: N/A;   WISDOM TOOTH EXTRACTION     Patient Active Problem List   Diagnosis Date Noted   Chronic right shoulder pain 01/25/2024   Body mass index 36.0-36.9, adult 08/16/2023   Exposure to sexually transmitted disease (STD) 07/08/2023   Flank pain 06/10/2023   Rotator cuff tear, left 01/18/2023   OA (osteoarthritis) of shoulder 12/03/2022   Peroneal tendon tear, left, subsequent encounter 01/17/2021   Recurrent herniation of lumbar disc 10/30/2020   Type 2 diabetes mellitus with obesity (HCC) 09/24/2020   Visit for routine gyn exam 11/29/2019   Encounter for screening mammogram for breast cancer 11/29/2019   History of bilateral tubal ligation 11/29/2019   S/P cesarean section 07/14/2016   Hypertension 07/05/2011   PCP: Susanna Epley, FNP  REFERRING PROVIDER: Sammye Cristal, MD  REFERRING DIAG: M25.511 (ICD-10-CM) - Right shoulder pain   THERAPY DIAG:  Chronic right shoulder pain  Rationale for Evaluation and Treatment: Rehabilitation  ONSET DATE: Chronic, ~October of 2024   SUBJECTIVE:  SUBJECTIVE STATEMENT: Pt reports she is going for MRI results today. She feels her pain is getting worse. Still does HEP which causes muscle burning.    Eval: Patient comes in with c/c of right shoulder pain that is constant. It wakes her up at night and is affecting her ability to work. Overhead motions are the most painful to her and repetitive motions hurt a lot. Tender to the touch in all areas including bicipital groove. Recent cortisone shot worked for two weeks but then stopped. Patient is still using the lidocaine  patches that don't help much aside for providing slight pain relief. Patient reports she is only sleeping  about 5 hours per night because of shoulder pain. Numbness and tingling to the elbow almost always.   Hand dominance: Right  PERTINENT HISTORY: Partial RTC repair on left shoulder 2024  PAIN:  Are you having pain? Yes:  NPRS scale: 8/10 at worst, 7-8/10 current Pain location: Right shoulder in all areas Pain description: Sharp, burning Aggravating factors: movement overhead, to the side, repetitive motions  Relieving factors: ice  PRECAUTIONS: None  RED FLAGS: None   WEIGHT BEARING RESTRICTIONS: No  FALLS:  Has patient fallen in last 6 months? No  LIVING ENVIRONMENT: Lives with: lives with their family Lives in: House/apartment No difficulty with ambulation at home  OCCUPATION: Custodian   PLOF: Independent  PATIENT GOALS: Return to ADLs and work without pain, sleep through the night.   OBJECTIVE:  Note: Objective measures were completed at Evaluation unless otherwise noted. PATIENT SURVEYS:  Cindia Crease 37/50  02/25/2023: 38/50:   03/24/24: 40/50      POSTURE: Forward posture, rounded shoulders.   UPPER EXTREMITY ROM:   Active ROM Right eval Left eval Right 02/21/2024 Right 03/03/2024 Right 03/10/2024 Right 03/17/24 Right  03/24/24  Shoulder flexion 75 145 120 AAROM 120 AROM 120 AROM  140 AAROM wall slide  115 105  Shoulder extension         Shoulder abduction 100 165    82 90  Shoulder adduction         Shoulder internal rotation L4        Shoulder external rotation 30 65       Elbow flexion         Elbow extension         Wrist flexion         Wrist extension         Wrist ulnar deviation         Wrist radial deviation         Wrist pronation         Wrist supination         (Blank rows = not tested)  UPPER EXTREMITY MMT:  MMT Right eval Left eval Right 03/17/24  Shoulder flexion 3 5   Shoulder extension     Shoulder abduction 3 4   Shoulder adduction     Shoulder internal rotation 3 5   Shoulder external rotation 3 5   Middle  trapezius 3    Lower trapezius 3    Elbow flexion 4-    Elbow extension     Wrist flexion     Wrist extension     Wrist ulnar deviation     Wrist radial deviation     Wrist pronation     Wrist supination     Grip strength (lbs) 0 lbs 60 lbs 26 lb  (Blank rows = not tested)  SHOULDER SPECIAL TESTS:  Perform ULTT when patient can tolerate ROM   JOINT MOBILITY TESTING:  Anterior, inferior, and posterior glides all painful with no relief   PALPATION:  Tenderness to anterior, posterior, and lateral shoulder on immediate palpation                                                             TREATMENT DATE:  [facility] Adult PT Treatment:                                                DATE: 03/24/24  Seated ER YTB 10 x 2  Seated IR YTB 10 x 2  Pulleys OH 1 minute flexion and scaption  Supine chest press dowel 3# x 10 Supine chest press to pullover x 10 3# on dowel  Supine red band horiz 10 x 2  Supine PNF D2 flexion AROM Right x 12 Sidelying right shoulder abduction x 10 Side lying right shoulder flexion x 10 Side lying right shoulder ER AROM  10 x 2      [facility] Adult PT Treatment:                                                DATE: 03/17/24 OH pulleys AA AA Shoulder wall slide flexion x 10  AA Shoulder wall slide abdct x 10 Row GTB x 15 Pulldown GTB x 15 Standing ER YTB x 10 Standing IR YTB x 10  Supine chest press dowel 3#  Supine chest press to pullover x 5 3# on dowel  Supine red band horiz  Supine PNF D2 flexion AROM Right  Updated HEP  [facility] Adult PT Treatment:                                                DATE: 03/10/2024 Right GHJ mobs to tolerance primarily inferior and posterior direction, distractive mobs with gentle oscillations to reduce pain and guarding PROM right shoulder all directions to tolerance Supine dowel press 2 x 10  Supine dowel press with overhead reach x 10 Supine dowel shoulder flexion x 10 Sidelying shoulder abduction 2 x 10 Sidelying shoulder ER  2 x 10 Seated overhead pulley x 2 min Standing wall slide shoulder flexion 3 x 5 Row with green 3 x 10  PATIENT EDUCATION: Education details: HEP Person educated: Patient Education method: Programmer, multimedia, Demonstration Education comprehension: verbalized understanding and returned demonstration  HOME EXERCISE PROGRAM: Access Code: [email or web address]    ASSESSMENT: CLINICAL IMPRESSION: Pt arrives reporting soreness in shoulder. Going today for MRI results. Has been compliant with current HEP however reports that her shoulder feels like she is getting worse and demonstrates decreased shoulder AROM today compared to previous measures. This is her last scheduled visit. Her quick [place] Retort has also decreased since eval. She will discuss with MD today regarding MRI and PT. She will call back to schedule if needed. Patient would benefit  from continued skilled PT to continue to increase shoulder mobility and strength to reduce pain and maximize functional ability.    EVAL: Patient is a 48 y.o. female who was seen today for physical therapy evaluation and treatment for c/c of chronic right shoulder pain. Patient is limited in ROM on the right side, PROM > AROM but limited due to muscle guarding and pain. ROM limitations most prominent in flexion, abduction, and ER. Patient has strength deficits on right side, with all right UE scoring a 3/5 and unable to resist against gravity.  Grip strength on the right UE is 0 lbs. Patient exhibits high pain levels that increase with all motions. Tenderness to palpation of anterior, posterior, and superior shoulder region, deltoid tendon, and all trap regions. Patient scored a 37/50 on the QuickDASH indicating high impact on function and ability to perform ADLs. Overall, patient would benefit from skilled PT in order to increase ROM and strength in order to decrease pain levels.   OBJECTIVE IMPAIRMENTS: decreased ROM, decreased strength, postural dysfunction, and pain.   ACTIVITY  LIMITATIONS: carrying, lifting, sleeping, dressing, and reach over head  PARTICIPATION LIMITATIONS: community activity and occupation  PERSONAL FACTORS: Time since onset of injury/illness/exacerbation are also affecting patient's functional outcome.    GOALS: Goals reviewed with patient? Yes  SHORT TERM GOALS: Target date: 02/22/2024  Patient will be I with initial HEP in order to progress with therapy. Baseline: Goal status: MET  2.  Patient will score </= 7/10 at worse on the NPS by 3rd visit in order to show an improvement in pain allowing for an increase in functional ability.  Baseline: 10/10 02/17/2024: 7/10 Goal status: MET  LONG TERM GOALS: Target date: 03/21/2024  Patient will be independent with final HEP in order to continue treatment at home for pain tolerance.  Baseline:  Goal status: INITIAL  2.  Patient will score </= 5/10 at worse on the NPS to show an improvement in pain allowing for an increase in functional ability.  Baseline: 10/10 03/24/24: 8/10 Goal status: ONGOING  3.  Patient will increase ROM by at least 30 degrees in shoulder flexion, abduction, and ER to increase functional ability.  Baseline: see above Goal status: ONGOING  4.  Patient will increase strength to a 4/5 to show an increase in functional activity  Baseline: see above Goal status: ONGOING  5.  Patient will score at least a 27/50 on the QuickDASH to show an improvement in functional levels to return to ADLs.  Baseline: 37/50 02/25/2023: 38/50 03/24/24: 40/50 Goal status: ONGOING   PLAN: PT FREQUENCY: 2x/week  PT DURATION: 8 weeks  PLANNED INTERVENTIONS: 97164- PT Re-evaluation, 97110-Therapeutic exercises, 97530- Therapeutic activity, 97112- Neuromuscular re-education, 97535- Self Care, 16109- Manual therapy, Patient/Family education, Dry Needling, Joint mobilization, Joint manipulation, Spinal manipulation, Spinal mobilization, Cryotherapy, and Moist heat  PLAN FOR NEXT SESSION:  Review/Revise HEP. Continue working on pain management. Increase ROM specifically flexion, abduction, and ER. Periscapular strengthening. Grip strength.    Gasper Karst, PTA 03/24/24 10:40 AM Phone: [phone] Fax: [phone]     PHYSICAL THERAPY DISCHARGE SUMMARY  Visits from Start of Care: 11  Current functional level related to goals / functional outcomes: See above   Remaining deficits: See above   Education / Equipment: HEP   Patient agrees to discharge. Patient goals were not met. Patient is being discharged due to not returning since the last visit.  Leah Primus, PT, DPT, LAT, ATC 05/16/24  2:06 PM Phone: [phone] Fax: [phone]

## 2024-05-20 ENCOUNTER — Emergency Department (HOSPITAL_BASED_OUTPATIENT_CLINIC_OR_DEPARTMENT_OTHER)

## 2024-05-20 ENCOUNTER — Emergency Department (HOSPITAL_BASED_OUTPATIENT_CLINIC_OR_DEPARTMENT_OTHER)
Admission: EM | Admit: 2024-05-20 | Discharge: 2024-05-20 | Disposition: A | Discharge status: Home / Self Care | Attending: Emergency Medicine | Admitting: Emergency Medicine

## 2024-05-20 ENCOUNTER — Other Ambulatory Visit: Payer: Self-pay

## 2024-05-20 DIAGNOSIS — R079 Chest pain, unspecified: Secondary | ICD-10-CM | POA: Diagnosis not present

## 2024-05-20 DIAGNOSIS — M545 Low back pain, unspecified: Secondary | ICD-10-CM | POA: Insufficient documentation

## 2024-05-20 DIAGNOSIS — Z7984 Long term (current) use of oral hypoglycemic drugs: Secondary | ICD-10-CM | POA: Diagnosis not present

## 2024-05-20 DIAGNOSIS — S41112A Laceration without foreign body of left upper arm, initial encounter: Secondary | ICD-10-CM | POA: Diagnosis not present

## 2024-05-20 DIAGNOSIS — S81811A Laceration without foreign body, right lower leg, initial encounter: Secondary | ICD-10-CM | POA: Insufficient documentation

## 2024-05-20 DIAGNOSIS — Y92410 Unspecified street and highway as the place of occurrence of the external cause: Secondary | ICD-10-CM | POA: Insufficient documentation

## 2024-05-20 DIAGNOSIS — E119 Type 2 diabetes mellitus without complications: Secondary | ICD-10-CM | POA: Diagnosis not present

## 2024-05-20 DIAGNOSIS — S81812A Laceration without foreign body, left lower leg, initial encounter: Secondary | ICD-10-CM | POA: Insufficient documentation

## 2024-05-20 DIAGNOSIS — I10 Essential (primary) hypertension: Secondary | ICD-10-CM | POA: Insufficient documentation

## 2024-05-20 DIAGNOSIS — S4991XA Unspecified injury of right shoulder and upper arm, initial encounter: Secondary | ICD-10-CM | POA: Diagnosis present

## 2024-05-20 DIAGNOSIS — M25562 Pain in left knee: Secondary | ICD-10-CM | POA: Diagnosis not present

## 2024-05-20 DIAGNOSIS — Z79899 Other long term (current) drug therapy: Secondary | ICD-10-CM | POA: Insufficient documentation

## 2024-05-20 DIAGNOSIS — S41111A Laceration without foreign body of right upper arm, initial encounter: Secondary | ICD-10-CM | POA: Diagnosis not present

## 2024-05-20 MED ORDER — CELECOXIB 200 MG PO CAPS
200.0000 mg | ORAL_CAPSULE | Freq: Two times a day (BID) | ORAL | 0 refills | Status: AC | PRN
Start: 2024-05-20 — End: ?

## 2024-05-20 MED ORDER — CYCLOBENZAPRINE HCL 10 MG PO TABS
10.0000 mg | ORAL_TABLET | Freq: Once | ORAL | Status: AC
  Administered 2024-05-20: 10 mg via ORAL
  Filled 2024-05-20: qty 1

## 2024-05-20 MED ORDER — CYCLOBENZAPRINE HCL 10 MG PO TABS: 10.0000 mg | ORAL_TABLET | Freq: Two times a day (BID) | ORAL | 0 refills | Status: AC | PRN

## 2024-05-20 MED ORDER — KETOROLAC TROMETHAMINE 30 MG/ML IJ SOLN
30.0000 mg | Freq: Once | INTRAMUSCULAR | Status: AC
  Administered 2024-05-20: 30 mg via INTRAMUSCULAR
  Filled 2024-05-20: qty 1

## 2024-05-20 NOTE — Discharge Instructions (Signed)
 As discussed, your workup today was overall reassuring.  No evidence of any obvious abnormality on the imaging studies performed today.  Will send you home with NSAID as well as muscle laxer to use as needed.  Note the muscle actually can cause drowsiness so please do not drive or perform any high-risk activity until you realize its effects on you.  Expect to feel worse over the few days before you begin to feel better.  Recommend follow-up with your primary care for reassessment of your symptoms.  Please not hesitate to return to the emergency department if the worrisome signs and symptoms we discussed become apparent.
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
## 2024-10-01 ENCOUNTER — Emergency Department (HOSPITAL_BASED_OUTPATIENT_CLINIC_OR_DEPARTMENT_OTHER)
Admission: EM | Admit: 2024-10-01 | Discharge: 2024-10-01 | Disposition: A | Discharge status: Home / Self Care | Attending: Emergency Medicine | Admitting: Emergency Medicine

## 2024-10-01 ENCOUNTER — Encounter (HOSPITAL_BASED_OUTPATIENT_CLINIC_OR_DEPARTMENT_OTHER): Payer: Self-pay

## 2024-10-01 ENCOUNTER — Other Ambulatory Visit: Payer: Self-pay

## 2024-10-01 DIAGNOSIS — Z79899 Other long term (current) drug therapy: Secondary | ICD-10-CM | POA: Diagnosis not present

## 2024-10-01 DIAGNOSIS — M25511 Pain in right shoulder: Secondary | ICD-10-CM | POA: Diagnosis present

## 2024-10-01 DIAGNOSIS — R030 Elevated blood-pressure reading, without diagnosis of hypertension: Secondary | ICD-10-CM

## 2024-10-01 DIAGNOSIS — I10 Essential (primary) hypertension: Secondary | ICD-10-CM | POA: Insufficient documentation

## 2024-10-01 MED ORDER — HYDROCODONE-ACETAMINOPHEN 5-325 MG PO TABS: 1.0000 | ORAL_TABLET | ORAL | 0 refills | Status: AC | PRN

## 2024-10-01 NOTE — ED Provider Notes (Signed)
 [place] [facility] Provider Note   CSN: [id number] Arrival date & time: 10/01/24  9077     Patient presents with: Shoulder Pain   Meredith York is a 48 y.o. female.   Patient is a 48 year old who presents with pain in her right shoulder.  She has a history of hypertension.  She also has issues with her rotator cuff in her right shoulder.  She is followed by Dr. Dozier with orthopedic surgery.  She indicates that she needs to be scheduled for rotator cuff surgery but right now her financial situation does not allow it.  She says that she needs something for the pain in her shoulder.  She has had a flareup over the last few days.  She denies any numbness in her right hand.  She says her arm is a little weaker but that is chronic and unchanged related to her rotator cuff situation.  She denies any increased swelling.  No recent injuries.  Her blood pressure is noted to be markedly elevated.  She does have a known history of hypertension.  She says that she does take blood pressure medication and has it at home but she has not taken in over the last 3 days.       Prior to Admission medications   Medication Sig Start Date End Date Taking? Authorizing Provider  HYDROcodone -acetaminophen  (NORCO/VICODIN) 5-325 MG tablet Take 1-2 tablets by mouth every 4 (four) hours as needed. 10/01/24  Yes Lenor Hollering, MD  atorvastatin  (LIPITOR) 20 MG tablet TAKE 1 TABLET(20 MG) BY MOUTH DAILY 09/28/23   Petrina Pries, NP  celecoxib  (CELEBREX ) 200 MG capsule Take 1 capsule (200 mg total) by mouth 2 (two) times daily as needed. 05/20/24   Silver Wonda LABOR, PA  cyclobenzaprine  (FLEXERIL ) 10 MG tablet Take 1 tablet (10 mg total) by mouth 2 (two) times daily as needed for muscle spasms. 05/20/24   Silver Wonda LABOR, PA  lidocaine  (LIDODERM ) 5 % Place 1 patch onto the skin daily. Remove & Discard patch within 12 hours or as directed by MD 01/07/24   Pamella Ozell LABOR, DO   olmesartan  (BENICAR ) 40 MG tablet Take 1 tablet (40 mg total) by mouth daily. 08/26/23   [place]ina Speaks, FNP  Semaglutide  (RYBELSUS ) 7 MG TABS Take 1 tablet (7 mg total) by mouth daily. Take 30 minutes before breakfast 08/26/23   Georgina Speaks, FNP    Allergies: Amoxicillin, Oxycodone , and Gabapentin     Review of Systems  Constitutional:  Negative for fever.  Gastrointestinal:  Negative for nausea and vomiting.  Musculoskeletal:  Positive for arthralgias. Negative for back pain, joint swelling and neck pain.  Skin:  Negative for wound.  Neurological:  Positive for weakness (Minor weakness to the right arm as compared to the left). Negative for numbness and headaches.    Updated Vital Signs BP (!) 185/114 (BP Location: Left Arm)   Pulse 80   Temp 98.3 F (36.8 C) (Oral)   Resp 18   Ht 5' 2 (1.575 m)   Wt 89.4 kg   LMP  (LMP Unknown)   SpO2 100%   BMI 36.03 kg/m   Physical Exam Constitutional:      Appearance: She is well-developed.  HENT:     Head: Normocephalic and atraumatic.  Cardiovascular:     Rate and Rhythm: Normal rate.  Pulmonary:     Effort: Pulmonary effort is normal.  Musculoskeletal:        General: Tenderness present.  Cervical back: Normal range of motion and neck supple.     Comments: Positive generalized tenderness to the right shoulder.  No swelling.  No warmth or erythema.  There is pain on range of motion of the shoulder.  No pain to the elbow or wrist.  No swelling of the arm.  Radial pulses intact.  She has normal sensation in the hand.  Normal grip strength and motor function in the hand.  She has decreased strength of the upper arm which she says is baseline for her.  Skin:    General: Skin is warm and dry.  Neurological:     Mental Status: She is alert and oriented to person, place, and time.     (all labs ordered are listed, but only abnormal results are displayed) Labs Reviewed - No data to display  EKG: None  Radiology: No results  found.   Procedures   Medications Ordered in the ED - No data to display                                  Medical Decision Making Risk Prescription drug management.   Patient is a 48 year old who presents with pain in her right shoulder.  She has known rotator cuff issue in the shoulder.  She does not report any new injuries so I do not feel that imaging would be indicated.  She does not have any clinical concerns for infection of the shoulder joint.  She request pain medication.  She says she uses Tylenol  occasionally but it does not help.  She has tried Mobic  which she was prescribed in May of this year but she said that did not help.  She has tried Lidoderm  patches.  She request something for pain.  I will give her a short course of Vicodin.  Her blood pressure is markedly elevated.  She is not symptomatic.  Encouraged her to take her blood pressure medication regularly.  She does have medication at home to take.  Encouraged her to follow-up with her PCP to have this rechecked.  She will make an appointment to follow-up with her orthopedist.  Return precautions were given.     Final diagnoses:  Acute pain of right shoulder  Elevated blood pressure reading    ED Discharge Orders          Ordered    HYDROcodone -acetaminophen  (NORCO/VICODIN) 5-325 MG tablet  Every 4 hours PRN        10/01/24 0940               Lenor Hollering, MD 10/01/24 [phone]

## 2024-10-01 NOTE — ED Triage Notes (Addendum)
 States has had increased right shoulder pain d/t rotator cuff. Needs surgery, seeing sports medicine. States here today for pain medicine injection because pain is worse and unable to get into doctor.  States hx of hypertension, missed 3 days of meds, hypertensive in triage

## 2024-10-01 NOTE — Discharge Instructions (Addendum)
 Make an appointment to follow-up with your orthopedist as discussed.  Your blood pressure was elevated.  Make sure that you are taking your blood pressure medication regularly and have this rechecked by your primary care doctor.  Return to the emergency room if you have any worsening symptoms.
